# Patient Record
Sex: Female | Born: 1935
Health system: Southern US, Community
[De-identification: ages and names within clinical notes are randomized; demographics above are authoritative.]

## PROBLEM LIST (undated history)

## (undated) DIAGNOSIS — R42 Dizziness and giddiness: Secondary | ICD-10-CM

## (undated) DIAGNOSIS — E039 Hypothyroidism, unspecified: Secondary | ICD-10-CM

## (undated) DIAGNOSIS — E785 Hyperlipidemia, unspecified: Secondary | ICD-10-CM

## (undated) DIAGNOSIS — M1990 Unspecified osteoarthritis, unspecified site: Secondary | ICD-10-CM

## (undated) DIAGNOSIS — Z950 Presence of cardiac pacemaker: Secondary | ICD-10-CM

## (undated) HISTORY — DX: Hyperlipidemia, unspecified: E78.5

## (undated) HISTORY — DX: Unspecified osteoarthritis, unspecified site: M19.90

## (undated) HISTORY — PX: FOOT SURGERY: SHX648

## (undated) HISTORY — DX: Hypothyroidism, unspecified: E03.9

## (undated) HISTORY — PX: BUNIONECTOMY: SHX129

## (undated) HISTORY — PX: BACK SURGERY: SHX140

---

## 2017-02-16 DIAGNOSIS — J343 Hypertrophy of nasal turbinates: Secondary | ICD-10-CM | POA: Diagnosis not present

## 2017-02-16 DIAGNOSIS — J312 Chronic pharyngitis: Secondary | ICD-10-CM | POA: Diagnosis not present

## 2017-02-16 DIAGNOSIS — R0982 Postnasal drip: Secondary | ICD-10-CM | POA: Diagnosis not present

## 2017-02-16 DIAGNOSIS — K136 Irritative hyperplasia of oral mucosa: Secondary | ICD-10-CM | POA: Diagnosis not present

## 2017-02-16 DIAGNOSIS — R070 Pain in throat: Secondary | ICD-10-CM | POA: Diagnosis not present

## 2017-05-31 DIAGNOSIS — E039 Hypothyroidism, unspecified: Secondary | ICD-10-CM | POA: Diagnosis not present

## 2017-05-31 DIAGNOSIS — R5383 Other fatigue: Secondary | ICD-10-CM | POA: Diagnosis not present

## 2017-05-31 DIAGNOSIS — G5793 Unspecified mononeuropathy of bilateral lower limbs: Secondary | ICD-10-CM | POA: Diagnosis not present

## 2017-06-13 DIAGNOSIS — I83891 Varicose veins of right lower extremities with other complications: Secondary | ICD-10-CM | POA: Diagnosis not present

## 2017-06-13 DIAGNOSIS — I83811 Varicose veins of right lower extremities with pain: Secondary | ICD-10-CM | POA: Diagnosis not present

## 2017-06-13 DIAGNOSIS — M79661 Pain in right lower leg: Secondary | ICD-10-CM | POA: Diagnosis not present

## 2017-06-13 DIAGNOSIS — M79662 Pain in left lower leg: Secondary | ICD-10-CM | POA: Diagnosis not present

## 2017-06-22 DIAGNOSIS — H40013 Open angle with borderline findings, low risk, bilateral: Secondary | ICD-10-CM | POA: Diagnosis not present

## 2017-06-22 DIAGNOSIS — H43393 Other vitreous opacities, bilateral: Secondary | ICD-10-CM | POA: Diagnosis not present

## 2017-06-22 DIAGNOSIS — H35363 Drusen (degenerative) of macula, bilateral: Secondary | ICD-10-CM | POA: Diagnosis not present

## 2017-09-28 DIAGNOSIS — J180 Bronchopneumonia, unspecified organism: Secondary | ICD-10-CM | POA: Diagnosis not present

## 2017-11-01 DIAGNOSIS — H9201 Otalgia, right ear: Secondary | ICD-10-CM | POA: Diagnosis not present

## 2018-02-23 ENCOUNTER — Telehealth: Payer: Self-pay | Admitting: Family Medicine

## 2018-02-23 NOTE — Telephone Encounter (Signed)
I am willing to take her as my patient.

## 2018-02-23 NOTE — Telephone Encounter (Signed)
Please advise 

## 2018-02-23 NOTE — Telephone Encounter (Unsigned)
Copied from [place] [id number]. Topic: Appointment Scheduling - New Patient >> Feb 23, 2018  1:12 PM Hewitt Shorts wrote: Pt is wanting to become a new patient to dr blyth-dr blyth also has seen her daughter in the past  nancy cohen   Best number [phone]

## 2018-02-24 NOTE — Telephone Encounter (Signed)
Please schedule next available  new patient appointment  thx

## 2018-02-27 NOTE — Telephone Encounter (Signed)
Left message for pt to call back and schedule np appt with [facility].

## 2018-03-30 ENCOUNTER — Encounter: Payer: Self-pay | Admitting: Family Medicine

## 2018-03-30 ENCOUNTER — Ambulatory Visit (INDEPENDENT_AMBULATORY_CARE_PROVIDER_SITE_OTHER): Payer: Medicare Other | Admitting: Family Medicine

## 2018-03-30 DIAGNOSIS — E039 Hypothyroidism, unspecified: Secondary | ICD-10-CM | POA: Diagnosis not present

## 2018-03-30 DIAGNOSIS — E785 Hyperlipidemia, unspecified: Secondary | ICD-10-CM

## 2018-03-30 DIAGNOSIS — Z0000 Encounter for general adult medical examination without abnormal findings: Secondary | ICD-10-CM | POA: Diagnosis not present

## 2018-03-30 NOTE — Progress Notes (Signed)
Subjective:  I acted as a Education administrator for Dr. Charlett Blake. [place], [place]  Patient ID: Cynthia Young, female    DOB: Oct 10, 1930, 84 y.o.   MRN: [id number]  Chief Complaint  Patient presents with  . New Patient (Initial Visit)    Bilateral leg pain  . Trigger Thumb    left thumb    HPI  Patient is in today to establish care. She has a past medical history of hyperlipidemia and hypothyroidism. She is in good health and she denies any recent febrile illness or hospitalizations. She does well managing her activities of daily living. She maintains a heart healthy diet and stays active. Denies CP/palp/SOB/HA/congestion/fevers/GI or GU c/o. Taking meds as prescribed  Patient Care Team: Mosie Lukes, MD as PCP - General (Family Medicine)   Past Medical History:  Diagnosis Date  . Arthritis   . Hyperlipidemia   . Hypothyroid     Past Surgical History:  Procedure Laterality Date  . BUNIONECTOMY Left   . FOOT SURGERY Right    bunion    Family History  Problem Relation Age of Onset  . Cancer Mother   . Heart disease Father   . Arthritis Father        rheumatoid  . Cancer Brother        colon    Social History   Socioeconomic History  . Marital status: Widowed    Spouse name: Not on file  . Number of children: Not on file  . Years of education: Not on file  . Highest education level: Not on file  Occupational History  . Not on file  Social Needs  . Financial resource strain: Not on file  . Food insecurity:    Worry: Not on file    Inability: Not on file  . Transportation needs:    Medical: Not on file    Non-medical: Not on file  Tobacco Use  . Smoking status: Never Smoker  . Smokeless tobacco: Never Used  Substance and Sexual Activity  . Alcohol use: Yes    Comment: socially  . Drug use: Not on file  . Sexual activity: Not Currently  Lifestyle  . Physical activity:    Days per week: Not on file    Minutes per session: Not on file  . Stress: Not on file    Relationships  . Social connections:    Talks on phone: Not on file    Gets together: Not on file    Attends religious service: Not on file    Active member of club or organization: Not on file    Attends meetings of clubs or organizations: Not on file    Relationship status: Not on file  . Intimate partner violence:    Fear of current or ex partner: Not on file    Emotionally abused: Not on file    Physically abused: Not on file    Forced sexual activity: Not on file  Other Topics Concern  . Not on file  Social History Narrative   worked in [employer], short time. No cigarettes or drug use. Live by self no dietary restictions and walks daily    Outpatient Medications Prior to Visit  Medication Sig Dispense Refill  . Biotin 2500 MCG CAPS Take 5,000 mcg by mouth.    . levothyroxine (SYNTHROID, LEVOTHROID) 50 MCG tablet Take 50 mcg by mouth daily before breakfast.     No facility-administered medications prior to visit.     Not on  File  Review of Systems  Constitutional: Negative for chills, fever and malaise/fatigue.  HENT: Negative for congestion and hearing loss.   Eyes: Negative for discharge.  Respiratory: Negative for cough, sputum production and shortness of breath.   Cardiovascular: Negative for chest pain, palpitations and leg swelling.  Gastrointestinal: Negative for abdominal pain, blood in stool, constipation, diarrhea, heartburn, nausea and vomiting.  Genitourinary: Negative for dysuria, frequency, hematuria and urgency.  Musculoskeletal: Negative for back pain, falls and myalgias.  Skin: Negative for rash.  Neurological: Negative for dizziness, sensory change, loss of consciousness, weakness and headaches.  Endo/Heme/Allergies: Negative for environmental allergies. Does not bruise/bleed easily.  Psychiatric/Behavioral: Negative for depression and suicidal ideas. The patient is not nervous/anxious and does not have insomnia.        Objective:    Physical  Exam  Constitutional: She is oriented to person, place, and time. No distress.  HENT:  Head: Normocephalic and atraumatic.  Right Ear: External ear normal.  Left Ear: External ear normal.  Nose: Nose normal.  Mouth/Throat: Oropharynx is clear and moist. No oropharyngeal exudate.  Eyes: Pupils are equal, round, and reactive to light. Conjunctivae are normal. Right eye exhibits no discharge. Left eye exhibits no discharge. No scleral icterus.  Neck: Normal range of motion. Neck supple. No thyromegaly present.  Cardiovascular: Normal rate, regular rhythm, normal heart sounds and intact distal pulses.  No murmur heard. Pulmonary/Chest: Effort normal and breath sounds normal. No respiratory distress. She has no wheezes. She has no rales.  Abdominal: Soft. Bowel sounds are normal. She exhibits no distension and no mass. There is no tenderness.  Musculoskeletal: Normal range of motion. She exhibits no edema or tenderness.  Lymphadenopathy:    She has no cervical adenopathy.  Neurological: She is alert and oriented to person, place, and time. She has normal reflexes. She displays normal reflexes. No cranial nerve deficit. Coordination normal.  Skin: Skin is warm and dry. No rash noted. She is not diaphoretic.    BP 118/60 (BP Location: Left Arm, Patient Position: Sitting, Cuff Size: Normal)   Pulse 70   Temp 98 F (36.7 C) (Oral)   Resp 16   Ht 4' 11"  (1.499 m)   Wt 127 lb 12.8 oz (58 kg)   SpO2 97%   BMI 25.81 kg/m  Wt Readings from Last 3 Encounters:  03/30/18 127 lb 12.8 oz (58 kg)   BP Readings from Last 3 Encounters:  03/30/18 118/60      There is no immunization history on file for this patient.  Health Maintenance  Topic Date Due  . TETANUS/TDAP  01/21/1949  . DEXA SCAN  01/20/1997  . PNA vac Low Risk Adult (1 of 2 - PCV13) 01/20/1997  . INFLUENZA VACCINE  06/15/2018    Lab Results  Component Value Date   WBC 8.9 03/30/2018   HGB 14.2 03/30/2018   HCT 43.3  03/30/2018   PLT 271.0 03/30/2018   CHOL 215 (H) 03/30/2018   TRIG 185.0 (H) 03/30/2018   HDL 60.20 03/30/2018   LDLCALC 118 (H) 03/30/2018   TSH 2.05 03/30/2018    Lab Results  Component Value Date   TSH 2.05 03/30/2018   Lab Results  Component Value Date   WBC 8.9 03/30/2018   HGB 14.2 03/30/2018   HCT 43.3 03/30/2018   MCV 92.2 03/30/2018   PLT 271.0 03/30/2018   No results found for: NA, K, CHLORIDE, CO2, GLUCOSE, BUN, CREATININE, BILITOT, ALKPHOS, AST, ALT, PROT, ALBUMIN, CALCIUM, ANIONGAP, EGFR,  GFR Lab Results  Component Value Date   CHOL 215 (H) 03/30/2018   Lab Results  Component Value Date   HDL 60.20 03/30/2018   Lab Results  Component Value Date   LDLCALC 118 (H) 03/30/2018   Lab Results  Component Value Date   TRIG 185.0 (H) 03/30/2018   Lab Results  Component Value Date   CHOLHDL 4 03/30/2018   No results found for: HGBA1C       Assessment & Plan:   Problem List Items Addressed This Visit    Hypothyroid   Relevant Medications   levothyroxine (SYNTHROID, LEVOTHROID) 50 MCG tablet   Other Relevant Orders   TSH (Completed)   Hyperlipidemia   Relevant Orders   Lipid panel (Completed)   Preventative health care    Patient encouraged to maintain heart healthy diet, regular exercise, adequate sleep. Consider daily probiotics. Take medications as prescribed. Patient seen with and examined with student.  Agree with documentation See separate note for further documentation      Relevant Orders   CBC with Differential/Platelet (Completed)   TSH (Completed)   Lipid panel (Completed)      I am having Cynthia Young maintain her levothyroxine and Biotin.  No orders of the defined types were placed in this encounter.   CMA served as Education administrator during this visit. History, Physical and Plan performed by medical provider. Documentation and orders reviewed and attested to.  Penni Homans, MD

## 2018-03-30 NOTE — Assessment & Plan Note (Signed)
Patient encouraged to maintain heart healthy diet, regular exercise, adequate sleep. Consider daily probiotics. Take medications as prescribed. Patient seen with and examined with student.  Agree with documentation See separate note for further documentation  

## 2018-03-30 NOTE — Patient Instructions (Addendum)
Hyland's leg cramp med Hydrate roughly 64 oz of clear fluids daily If palpitatins worsen let us know and we can refer to cardiology  Preventive Care 82 Years and Older, Female Preventive care refers to lifestyle choices and visits with your health care provider that can promote health and wellness. What does preventive care include?  A yearly physical exam. This is also called an annual well check.  Dental exams once or twice a year.  Routine eye exams. Ask your health care provider how often you should have your eyes checked.  Personal lifestyle choices, including: ? Daily care of your teeth and gums. ? Regular physical activity. ? Eating a healthy diet. ? Avoiding tobacco and drug use. ? Limiting alcohol use. ? Practicing safe sex. ? Taking low-dose aspirin every day. ? Taking vitamin and mineral supplements as recommended by your health care provider. What happens during an annual well check? The services and screenings done by your health care provider during your annual well check will depend on your age, overall health, lifestyle risk factors, and family history of disease. Counseling Your health care provider may ask you questions about your:  Alcohol use.  Tobacco use.  Drug use.  Emotional well-being.  Home and relationship well-being.  Sexual activity.  Eating habits.  History of falls.  Memory and ability to understand (cognition).  Work and work Statistician.  Reproductive health.  Screening You may have the following tests or measurements:  Height, weight, and BMI.  Blood pressure.  Lipid and cholesterol levels. These may be checked every 5 years, or more frequently if you are over 34 years old.  Skin check.  Lung cancer screening. You may have this screening every year starting at age 82 if you have a 30-pack-year history of smoking and currently smoke or have quit within the past 15 years.  Fecal occult blood test (FOBT) of the stool. You  may have this test every year starting at age 82.  Flexible sigmoidoscopy or colonoscopy. You may have a sigmoidoscopy every 5 years or a colonoscopy every 10 years starting at age 82.  Hepatitis C blood test.  Hepatitis B blood test.  Sexually transmitted disease (STD) testing.  Diabetes screening. This is done by checking your blood sugar (glucose) after you have not eaten for a while (fasting). You may have this done every 1-3 years.  Bone density scan. This is done to screen for osteoporosis. You may have this done starting at age 82.  Mammogram. This may be done every 1-2 years. Talk to your health care provider about how often you should have regular mammograms.  Talk with your health care provider about your test results, treatment options, and if necessary, the need for more tests. Vaccines Your health care provider may recommend certain vaccines, such as:  Influenza vaccine. This is recommended every year.  Tetanus, diphtheria, and acellular pertussis (Tdap, Td) vaccine. You may need a Td booster every 10 years.  Varicella vaccine. You may need this if you have not been vaccinated.  Zoster vaccine. You may need this after age 82.  Measles, mumps, and rubella (MMR) vaccine. You may need at least one dose of MMR if you were born in 1957 or later. You may also need a second dose.  Pneumococcal 13-valent conjugate (PCV13) vaccine. One dose is recommended after age 82.  Pneumococcal polysaccharide (PPSV23) vaccine. One dose is recommended after age 82.  Meningococcal vaccine. You may need this if you have certain conditions.  Hepatitis A  vaccine. You may need this if you have certain conditions or if you travel or work in places where you may be exposed to hepatitis A.  Hepatitis B vaccine. You may need this if you have certain conditions or if you travel or work in places where you may be exposed to hepatitis B.  Haemophilus influenzae type b (Hib) vaccine. You may need  this if you have certain conditions.  Talk to your health care provider about which screenings and vaccines you need and how often you need them. This information is not intended to replace advice given to you by your health care provider. Make sure you discuss any questions you have with your health care provider. Document Released: 11/28/2015 Document Revised: 07/21/2016 Document Reviewed: 09/02/2015 Elsevier Interactive Patient Education  [employer].

## 2018-03-31 LAB — LIPID PANEL
CHOL/HDL RATIO: 4
CHOLESTEROL: 215 mg/dL — AB (ref 0–200)
HDL: 60.2 mg/dL (ref >39.00)
LDL CALC: 118 mg/dL — AB (ref 0–99)
NonHDL: 154.86
Triglycerides: 185 mg/dL — ABNORMAL HIGH (ref 0.0–149.0)
VLDL: 37 mg/dL (ref 0.0–40.0)

## 2018-03-31 LAB — CBC WITH DIFFERENTIAL/PLATELET
BASOS ABS: 0.1 10*3/uL (ref 0.0–0.1)
BASOS PCT: 1.1 % (ref 0.0–3.0)
EOS ABS: 0.2 10*3/uL (ref 0.0–0.7)
Eosinophils Relative: 2 % (ref 0.0–5.0)
HEMATOCRIT: 43.3 % (ref 36.0–46.0)
Hemoglobin: 14.2 g/dL (ref 12.0–15.0)
LYMPHS PCT: 33.4 % (ref 12.0–46.0)
Lymphs Abs: 3 10*3/uL (ref 0.7–4.0)
MCHC: 32.7 g/dL (ref 30.0–36.0)
MCV: 92.2 fl (ref 78.0–100.0)
Monocytes Absolute: 0.9 10*3/uL (ref 0.1–1.0)
Monocytes Relative: 9.6 % (ref 3.0–12.0)
NEUTROS PCT: 53.9 % (ref 43.0–77.0)
Neutro Abs: 4.8 10*3/uL (ref 1.4–7.7)
Platelets: 271 10*3/uL (ref 150.0–400.0)
RBC: 4.7 Mil/uL (ref 3.87–5.11)
RDW: 14.1 % (ref 11.5–15.5)
WBC: 8.9 10*3/uL (ref 4.0–10.5)

## 2018-03-31 LAB — TSH: TSH: 2.05 u[IU]/mL (ref 0.35–4.50)

## 2018-04-18 ENCOUNTER — Telehealth: Payer: Self-pay | Admitting: *Deleted

## 2018-04-18 NOTE — Telephone Encounter (Signed)
Received Medical records from [place]; forwarded to provider/SLS 06/04

## 2018-05-12 ENCOUNTER — Telehealth: Payer: Self-pay | Admitting: Family Medicine

## 2018-05-12 NOTE — Telephone Encounter (Signed)
Copied from [place] [id number]. Topic: Quick Communication - Rx Refill/Question >> May 12, 2018  9:50 AM Marval Regal L wrote: Medication: levothyroxine (SYNTHROID, LEVOTHROID) 50 MCG tablet [[id number]].   Has the patient contacted their pharmacy?yes  Preferred Pharmacy (with phone number or street name): [id number] [place], [place] [phone] (Phone) [phone] (Fax)   Agent: Please be advised that RX refills may take up to 3 business days. We ask that you follow-up with your pharmacy.

## 2018-05-13 NOTE — Telephone Encounter (Signed)
Levothyroxine refill Last OV:03/30/18 Last refill:03/30/18 by historical provider ERX:[id number] Pharmacy: [id number] - [place], [address] AT [address] [phone] (Phone) [phone] (Fax)

## 2018-05-15 MED ORDER — LEVOTHYROXINE SODIUM 50 MCG PO TABS: 50.0000 ug | ORAL_TABLET | Freq: Every day | ORAL | 0 refills | Status: DC

## 2018-05-15 NOTE — Telephone Encounter (Signed)
Medication sent to patient's pharmacy.

## 2018-05-25 ENCOUNTER — Telehealth: Payer: Self-pay

## 2018-05-25 DIAGNOSIS — Z780 Asymptomatic menopausal state: Secondary | ICD-10-CM

## 2018-05-25 DIAGNOSIS — E2839 Other primary ovarian failure: Secondary | ICD-10-CM

## 2018-05-25 NOTE — Telephone Encounter (Signed)
Placed order for Dexa scan

## 2018-06-01 NOTE — Telephone Encounter (Signed)
Received call from [facility] that pt was calling to let us know she has not been contacted to schedule DEXA scan yet. Order was entered for [place] and number was given to pt to call to schedule appt.

## 2018-06-05 ENCOUNTER — Ambulatory Visit (HOSPITAL_BASED_OUTPATIENT_CLINIC_OR_DEPARTMENT_OTHER)
Admission: RE | Admit: 2018-06-05 | Discharge: 2018-06-05 | Disposition: A | Discharge status: Home / Self Care | Payer: Medicare Other | Source: Ambulatory Visit | Attending: Family Medicine | Admitting: Family Medicine

## 2018-06-05 DIAGNOSIS — Z780 Asymptomatic menopausal state: Secondary | ICD-10-CM | POA: Insufficient documentation

## 2018-06-05 DIAGNOSIS — E2839 Other primary ovarian failure: Secondary | ICD-10-CM | POA: Insufficient documentation

## 2018-06-05 DIAGNOSIS — M8588 Other specified disorders of bone density and structure, other site: Secondary | ICD-10-CM | POA: Insufficient documentation

## 2018-06-05 DIAGNOSIS — M810 Age-related osteoporosis without current pathological fracture: Secondary | ICD-10-CM | POA: Diagnosis not present

## 2018-08-09 ENCOUNTER — Other Ambulatory Visit: Payer: Self-pay | Admitting: Family Medicine

## 2018-08-13 DIAGNOSIS — R51 Headache: Secondary | ICD-10-CM | POA: Diagnosis not present

## 2018-08-17 ENCOUNTER — Other Ambulatory Visit: Payer: Self-pay

## 2018-08-17 ENCOUNTER — Emergency Department (HOSPITAL_BASED_OUTPATIENT_CLINIC_OR_DEPARTMENT_OTHER)
Admission: EM | Admit: 2018-08-17 | Discharge: 2018-08-17 | Disposition: A | Discharge status: Home / Self Care | Payer: Medicare Other | Attending: Emergency Medicine | Admitting: Emergency Medicine

## 2018-08-17 ENCOUNTER — Emergency Department (HOSPITAL_BASED_OUTPATIENT_CLINIC_OR_DEPARTMENT_OTHER): Payer: Medicare Other

## 2018-08-17 ENCOUNTER — Encounter (HOSPITAL_BASED_OUTPATIENT_CLINIC_OR_DEPARTMENT_OTHER): Payer: Self-pay | Admitting: *Deleted

## 2018-08-17 DIAGNOSIS — W0110XA Fall on same level from slipping, tripping and stumbling with subsequent striking against unspecified object, initial encounter: Secondary | ICD-10-CM | POA: Diagnosis not present

## 2018-08-17 DIAGNOSIS — E039 Hypothyroidism, unspecified: Secondary | ICD-10-CM | POA: Diagnosis not present

## 2018-08-17 DIAGNOSIS — Y9301 Activity, walking, marching and hiking: Secondary | ICD-10-CM | POA: Insufficient documentation

## 2018-08-17 DIAGNOSIS — R51 Headache: Secondary | ICD-10-CM | POA: Insufficient documentation

## 2018-08-17 DIAGNOSIS — Z79899 Other long term (current) drug therapy: Secondary | ICD-10-CM | POA: Diagnosis not present

## 2018-08-17 DIAGNOSIS — Y92009 Unspecified place in unspecified non-institutional (private) residence as the place of occurrence of the external cause: Secondary | ICD-10-CM | POA: Diagnosis not present

## 2018-08-17 DIAGNOSIS — Y999 Unspecified external cause status: Secondary | ICD-10-CM | POA: Insufficient documentation

## 2018-08-17 DIAGNOSIS — S0990XA Unspecified injury of head, initial encounter: Secondary | ICD-10-CM | POA: Diagnosis not present

## 2018-08-17 MED ORDER — HYDROCODONE-ACETAMINOPHEN 5-325 MG PO TABS: 1.0000 | ORAL_TABLET | Freq: Once | ORAL | Status: DC

## 2018-08-17 NOTE — ED Notes (Signed)
MD in to see patient. Awaiting discharge. Awake, alert, orient times 4. Mild headache. Speech clear.

## 2018-08-17 NOTE — ED Provider Notes (Signed)
[facility] [place] [facility]PART[facility] Provider Note   CSN: [id number] Arrival date & time: 08/17/18  0935     History   Chief Complaint Chief Complaint  Patient presents with  . Fall    HPI Cynthia Young is a 84 y.o. female past month history of hyperlipidemia, hypothyroid who presents for evaluation of persistent headache after head injury that occurred approximately 4 days ago.  She reports that approximately 4 days ago, she was walking in her house when she tripped over a chair, causing her to trip and fall.  She states that she hit the right side of her head on the wall.  She denies any LOC at the time.  She denies any preceding chest pain, dizziness.  Patient states that since then, she has had a headache.  She states that headache is intermittent will slightly improve after taking Tylenol.  She states that when headache began, is gradual in nature and progressively worsened.  Denies any thunderclap headache.  Denies any other new trauma, injury, fall.  She states that she was seen in urgent care 2 days ago for evaluation of symptoms.  At that time, they reported no neuro deficits.  They stated that if she continued to have any, she needed to come to the ED for further evaluation.  Patient reports she is not currently on blood thinners.  Patient denies any vision changes, difficulty speaking, numbness/weakness of her arms or legs, difficulty in bleeding, chest pain, difficulty breathing, neck pain, back pain.  The history is provided by the patient.    Past Medical History:  Diagnosis Date  . Arthritis   . Hyperlipidemia   . Hypothyroid     Patient Active Problem List   Diagnosis Date Noted  . Preventative health care 03/30/2018  . Hypothyroid   . Hyperlipidemia     Past Surgical History:  Procedure Laterality Date  . BUNIONECTOMY Left   . FOOT SURGERY Right    bunion     OB History   None      Home Medications    Prior to Admission medications     Medication Sig Start Date End Date Taking? Authorizing Provider  Biotin 2500 MCG CAPS Take 5,000 mcg by mouth.    [provider]  levothyroxine (SYNTHROID, LEVOTHROID) 50 MCG tablet Take 1 tablet (50 mcg total) by mouth daily before breakfast. 08/09/18   Mosie Lukes, MD    Family History Family History  Problem Relation Age of Onset  . Cancer Mother   . Heart disease Father   . Arthritis Father        rheumatoid  . Cancer Brother        colon    Social History Social History   Tobacco Use  . Smoking status: Never Smoker  . Smokeless tobacco: Never Used  Substance Use Topics  . Alcohol use: Yes    Comment: socially  . Drug use: Not on file     Allergies   Patient has no known allergies.   Review of Systems Review of Systems  Constitutional: Negative for fever.  Eyes: Negative for visual disturbance.  Respiratory: Negative for shortness of breath.   Cardiovascular: Negative for chest pain.  Gastrointestinal: Negative for abdominal pain, nausea and vomiting.  Genitourinary: Negative for dysuria and hematuria.  Musculoskeletal: Negative for back pain and neck pain.  Neurological: Positive for headaches. Negative for weakness and numbness.  All other systems reviewed and are negative.    Physical Exam Updated  Vital Signs BP (!) 170/69 (BP Location: Right Arm)   Pulse (!) 55   Temp 98.4 F (36.9 C) (Oral)   Resp 16   Ht 4\' 11"  (1.499 m)   Wt 57.2 kg   SpO2 100%   BMI 25.45 kg/m   Physical Exam  Constitutional: She is oriented to person, place, and time. She appears well-developed and well-nourished.  HENT:  Head: Normocephalic and atraumatic.    Mouth/Throat: Oropharynx is clear and moist and mucous membranes are normal.  Eyes: Pupils are equal, round, and reactive to light. Conjunctivae, EOM and lids are normal.  Neck: Full passive range of motion without pain.  Full flexion/extension and lateral movement of neck fully intact. No bony  midline tenderness. No deformities or crepitus.   Cardiovascular: Normal rate, regular rhythm, normal heart sounds and normal pulses. Exam reveals no gallop and no friction rub.  No murmur heard. Pulmonary/Chest: Effort normal and breath sounds normal.  Abdominal: Soft. Normal appearance. There is no tenderness. There is no rigidity and no guarding.  Musculoskeletal: Normal range of motion.  Neurological: She is alert and oriented to person, place, and time.  Cranial nerves III-XII intact Follows commands, Moves all extremities  5/5 strength to BUE and BLE  Sensation intact throughout all major nerve distributions Normal finger to nose. No dysdiadochokinesia. No pronator drift. No gait abnormalities  No slurred speech. No facial droop.   Skin: Skin is warm and dry. Capillary refill takes less than 2 seconds.  Psychiatric: She has a normal mood and affect. Her speech is normal.  Nursing note and vitals reviewed.    ED Treatments / Results  Labs (all labs ordered are listed, but only abnormal results are displayed) Labs Reviewed - No data to display  EKG None  Radiology Ct Head Wo Contrast  Result Date: 08/17/2018 CLINICAL DATA:  Tripped in bedroom hitting right side of head on chair. EXAM: CT HEAD WITHOUT CONTRAST TECHNIQUE: Contiguous axial images were obtained from the base of the skull through the vertex without intravenous contrast. COMPARISON:  None FINDINGS: Brain: No evidence of acute infarction, hemorrhage, hydrocephalus, extra-axial collection or mass lesion/mass effect. There is mild diffuse low-attenuation within the subcortical and periventricular white matter compatible with chronic microvascular disease. Prominence of sulci and ventricles identified. Vascular: No hyperdense vessel or unexpected calcification. Skull: Normal. Negative for fracture or focal lesion. Sinuses/Orbits: No acute finding. Other: None. IMPRESSION: 1. No acute intracranial abnormality. 2. Chronic  small vessel ischemic disease and brain atrophy. Electronically Signed   By: Kerby Moors M.D.   On: 08/17/2018 12:12    Procedures Procedures (including critical care time)  Medications Ordered in ED Medications - No data to display   Initial Impression / Assessment and Plan / ED Course  I have reviewed the triage vital signs and the nursing notes.  Pertinent labs & imaging results that were available during my care of the patient were reviewed by me and considered in my medical decision making (see chart for details).     84 year old female who presents for evaluation of headache after she hit her head against a wall 5 days ago.  Was seen in urgent care 2 days ago and was told if she continued to have a headache, she need to come to the ED for further evaluation.  She is not on any blood thinners.  Denies any LOC.  She has not had any vision changes, numbness/weakness of her arms and legs, difficulty ambulate, vomiting.  Reports headache  is gradual in nature.  No thunderclap headache.  On exam, no evidence of neuro deficits.  She does have a small hematoma noted to the right forehead with some tenderness palpation.  No deformity or crepitus noted. Patient is afebrile, non-toxic appearing, sitting comfortably on examination table. Vital signs reviewed and stable.  Low suspicion for ICH/SAH given duration of symptoms, reassuring presentation but given given continued headache and concern, will plan for CT head for evaluation.  CT head negative for any acute abnormality.  Discussed with Dr. Sherry Ruffing after definitive evaluation of patient.  Agrees with plan for discharge home.  Discussed results with patient.  Occurred at home supportive care measures.  Discussed with patient that this could still be some postconcussive symptoms they would not show on imaging.  Encourage at home supportive care measures, including brain rest.  Encouraged to follow-up with her primary care doctor.  Will give  outpatient neurology referral since she continues to have headache. Patient had ample opportunity for questions and discussion. All patient's questions were answered with full understanding. Strict return precautions discussed. Patient expresses understanding and agreement to plan.   Final Clinical Impressions(s) / ED Diagnoses   Final diagnoses:  Minor head injury, initial encounter    ED Discharge Orders    None       Volanda Napoleon, PA-C 08/17/18 1819    Tegeler, Gwenyth Allegra, MD 08/19/18 [phone]

## 2018-08-17 NOTE — ED Notes (Signed)
ED Provider at bedside discussing test results and dispo plan of care. 

## 2018-08-17 NOTE — ED Triage Notes (Signed)
Pt reports trip and fall on Sunday hitting her head against the wall, no loc, seen at urgent care and told if she cont with ha to come here, cont with ha today, 8/10.

## 2018-08-17 NOTE — Discharge Instructions (Signed)
You can take 1000 mg of Tylenol.  Do not exceed 4000 mg of Tylenol a day.  As we discussed, engage in brain rest.  This includes making sure you are getting plenty of rest, staying hydrated.  This also includes limiting the amount of screen time, including TV, computer, phone use.  This also includes things that are strenuous on your brain.  If you do not have any improvement call please follow-up with neurology in the next 2 to 4 weeks for further evaluation.  Return to emergency department for any worsening pain, blurry vision, vomiting, difficulty walking, numbness/weakness of your arms or legs or any other worsening or concerning symptoms.

## 2018-09-21 ENCOUNTER — Ambulatory Visit (INDEPENDENT_AMBULATORY_CARE_PROVIDER_SITE_OTHER): Payer: Medicare Other | Admitting: Family Medicine

## 2018-09-21 ENCOUNTER — Encounter: Payer: Self-pay | Admitting: Family Medicine

## 2018-09-21 DIAGNOSIS — E039 Hypothyroidism, unspecified: Secondary | ICD-10-CM

## 2018-09-21 DIAGNOSIS — E785 Hyperlipidemia, unspecified: Secondary | ICD-10-CM

## 2018-09-21 DIAGNOSIS — M791 Myalgia, unspecified site: Secondary | ICD-10-CM | POA: Diagnosis not present

## 2018-09-21 NOTE — Patient Instructions (Signed)
Jobst stockings are compression hose  Increase hydration to roughly 60-70 ounces of water and noncaffeine beverages  Consider biofreeze, blue emu, Aspercreme makes a new Lidocaine gel to help with the pain Varicose Veins Varicose veins are veins that have become enlarged and twisted. They are usually seen in the legs but can occur in other parts of the body as well. What are the causes? This condition is the result of valves in the veins not working properly. Valves in the veins help to return blood from the leg to the heart. If these valves are damaged, blood flows backward and backs up into the veins in the leg near the skin. This causes the veins to become larger. What increases the risk? People who are on their feet a lot, who are pregnant, or who are overweight are more likely to develop varicose veins. What are the signs or symptoms?  Bulging, twisted-appearing, bluish veins, most commonly found on the legs.  Leg pain or a feeling of heaviness. These symptoms may be worse at the end of the day.  Leg swelling.  Changes in skin color. How is this diagnosed? A health care provider can usually diagnose varicose veins by examining your legs. Your health care provider may also recommend an ultrasound of your leg veins. How is this treated? Most varicose veins can be treated at home.However, other treatments are available for people who have persistent symptoms or want to improve the cosmetic appearance of the varicose veins. These treatment options include:  Sclerotherapy. A solution is injected into the vein to close it off.  Laser treatment. A laser is used to heat the vein to close it off.  Radiofrequency vein ablation. An electrical current produced by radio waves is used to close off the vein.  Phlebectomy. The vein is surgically removed through small incisions made over the varicose vein.  Vein ligation and stripping. The vein is surgically removed through incisions made over  the varicose vein after the vein has been tied (ligated).  Follow these instructions at home:  Do not stand or sit in one position for long periods of time. Do not sit with your legs crossed. Rest with your legs raised during the day.  Wear compression stockings as directed by your health care provider. These stockings help to prevent blood clots and reduce swelling in your legs.  Do not wear other tight, encircling garments around your legs, pelvis, or waist.  Walk as much as possible to increase blood flow.  Raise the foot of your bed at night with 2-inch blocks.  If you get a cut in the skin over the vein and the vein bleeds, lie down with your leg raised and press on it with a clean cloth until the bleeding stops. Then place a bandage (dressing) on the cut. See your health care provider if it continues to bleed. Contact a health care provider if:  The skin around your ankle starts to break down.  You have pain, redness, tenderness, or hard swelling in your leg over a vein.  You are uncomfortable because of leg pain. This information is not intended to replace advice given to you by your health care provider. Make sure you discuss any questions you have with your health care provider. Document Released: 08/11/2005 Document Revised: 04/08/2016 Document Reviewed: 05/04/2016 Elsevier Interactive Patient Education  2017 [employer].

## 2018-09-25 DIAGNOSIS — M791 Myalgia, unspecified site: Secondary | ICD-10-CM | POA: Insufficient documentation

## 2018-09-25 NOTE — Progress Notes (Signed)
Subjective:    Patient ID: Cynthia Young, female    DOB: 11-Aug-1930, 84 y.o.   MRN: [id number]  Chief Complaint  Patient presents with  . Hypothyroidism    Here for 6 months f/up    HPI Patient is in today for follow-up.  She feels well today but she did have a fall back in October she tripped on a chair in her house and hit her head she does feel she had a mild concussion but is improved at this time.  Did also suffer a concussion in 2012.  Her greatest complaint today is actually leg pain.  She notes when she lies down at night her legs hurt the joints as well as the muscles.  She acknowledges not hydrating well.  No recent febrile illness or hospitalization. Denies CP/palp/SOB/HA/congestion/fevers/GI or GU c/o. Taking meds as prescribed  Past Medical History:  Diagnosis Date  . Arthritis   . Hyperlipidemia   . Hypothyroid     Past Surgical History:  Procedure Laterality Date  . BUNIONECTOMY Left   . FOOT SURGERY Right    bunion    Family History  Problem Relation Age of Onset  . Cancer Mother   . Heart disease Father   . Arthritis Father        rheumatoid  . Cancer Brother        colon    Social History   Socioeconomic History  . Marital status: Widowed    Spouse name: Not on file  . Number of children: Not on file  . Years of education: Not on file  . Highest education level: Not on file  Occupational History  . Not on file  Social Needs  . Financial resource strain: Not on file  . Food insecurity:    Worry: Not on file    Inability: Not on file  . Transportation needs:    Medical: Not on file    Non-medical: Not on file  Tobacco Use  . Smoking status: Never Smoker  . Smokeless tobacco: Never Used  Substance and Sexual Activity  . Alcohol use: Yes    Comment: socially  . Drug use: Not on file  . Sexual activity: Not Currently  Lifestyle  . Physical activity:    Days per week: Not on file    Minutes per session: Not on file  . Stress: Not on file   Relationships  . Social connections:    Talks on phone: Not on file    Gets together: Not on file    Attends religious service: Not on file    Active member of club or organization: Not on file    Attends meetings of clubs or organizations: Not on file    Relationship status: Not on file  . Intimate partner violence:    Fear of current or ex partner: Not on file    Emotionally abused: Not on file    Physically abused: Not on file    Forced sexual activity: Not on file  Other Topics Concern  . Not on file  Social History Narrative   worked in [employer], short time. No cigarettes or drug use. Live by self no dietary restictions and walks daily    Outpatient Medications Prior to Visit  Medication Sig Dispense Refill  . Biotin 2500 MCG CAPS Take 5,000 mcg by mouth.    . levothyroxine (SYNTHROID, LEVOTHROID) 50 MCG tablet Take 1 tablet (50 mcg total) by mouth daily before breakfast. 90 tablet 0  No facility-administered medications prior to visit.     No Known Allergies  Review of Systems  Constitutional: Negative for fever and malaise/fatigue.  HENT: Negative for congestion.   Eyes: Negative for blurred vision.  Respiratory: Negative for shortness of breath.   Cardiovascular: Negative for chest pain, palpitations and leg swelling.  Gastrointestinal: Negative for abdominal pain, blood in stool and nausea.  Genitourinary: Negative for dysuria and frequency.  Musculoskeletal: Positive for joint pain and myalgias. Negative for falls.  Skin: Negative for rash.  Neurological: Negative for dizziness, loss of consciousness and headaches.  Endo/Heme/Allergies: Negative for environmental allergies.  Psychiatric/Behavioral: Negative for depression. The patient is not nervous/anxious.        Objective:    Physical Exam  Constitutional: She is oriented to person, place, and time. She appears well-developed and well-nourished. No distress.  HENT:  Head: Normocephalic and  atraumatic.  Nose: Nose normal.  Eyes: Right eye exhibits no discharge. Left eye exhibits no discharge.  Neck: Normal range of motion. Neck supple.  Cardiovascular: Normal rate and regular rhythm.  Pulmonary/Chest: Effort normal and breath sounds normal.  Abdominal: Soft. Bowel sounds are normal. There is no tenderness.  Musculoskeletal: She exhibits no edema.  Neurological: She is alert and oriented to person, place, and time.  Skin: Skin is warm and dry.  Psychiatric: She has a normal mood and affect.  Nursing note and vitals reviewed.   BP (!) 119/55 (BP Location: Right Arm, Patient Position: Sitting, Cuff Size: Small)   Pulse (!) 57   Temp 98.9 F (37.2 C) (Oral)   Resp 16   Ht 4' 11" (1.499 m)   Wt 126 lb (57.2 kg)   SpO2 100%   BMI 25.45 kg/m  Wt Readings from Last 3 Encounters:  09/21/18 126 lb (57.2 kg)  08/17/18 126 lb (57.2 kg)  03/30/18 127 lb 12.8 oz (58 kg)     Lab Results  Component Value Date   WBC 8.9 03/30/2018   HGB 14.2 03/30/2018   HCT 43.3 03/30/2018   PLT 271.0 03/30/2018   CHOL 215 (H) 03/30/2018   TRIG 185.0 (H) 03/30/2018   HDL 60.20 03/30/2018   LDLCALC 118 (H) 03/30/2018   TSH 2.05 03/30/2018    Lab Results  Component Value Date   TSH 2.05 03/30/2018   Lab Results  Component Value Date   WBC 8.9 03/30/2018   HGB 14.2 03/30/2018   HCT 43.3 03/30/2018   MCV 92.2 03/30/2018   PLT 271.0 03/30/2018   No results found for: NA, K, CHLORIDE, CO2, GLUCOSE, BUN, CREATININE, BILITOT, ALKPHOS, AST, ALT, PROT, ALBUMIN, CALCIUM, ANIONGAP, EGFR, GFR Lab Results  Component Value Date   CHOL 215 (H) 03/30/2018   Lab Results  Component Value Date   HDL 60.20 03/30/2018   Lab Results  Component Value Date   LDLCALC 118 (H) 03/30/2018   Lab Results  Component Value Date   TRIG 185.0 (H) 03/30/2018   Lab Results  Component Value Date   CHOLHDL 4 03/30/2018   No results found for: HGBA1C     Assessment & Plan:   Problem List Items  Addressed This Visit    Hypothyroid    On Levothyroxine, continue to monitor      Hyperlipidemia    Encouraged heart healthy diet, increase exercise, avoid trans fats, consider a krill oil cap daily      Myalgia    Notes leg pain at night. Encouraged increased hydration and try topical treatments.  I am having Cynthia Young maintain her Biotin and levothyroxine.  No orders of the defined types were placed in this encounter.    Penni Homans, MD

## 2018-09-25 NOTE — Assessment & Plan Note (Signed)
Notes leg pain at night. Encouraged increased hydration and try topical treatments.

## 2018-09-25 NOTE — Assessment & Plan Note (Signed)
On Levothyroxine, continue to monitor 

## 2018-09-25 NOTE — Assessment & Plan Note (Signed)
Encouraged heart healthy diet, increase exercise, avoid trans fats, consider a krill oil cap daily 

## 2018-10-02 ENCOUNTER — Telehealth: Payer: Self-pay

## 2018-10-02 NOTE — Telephone Encounter (Signed)
Called pt and let her know I refaxed ROI form to Dr. [facility] office. I also scanned confirmation that ROI was sent again 10/02/18.

## 2018-10-02 NOTE — Telephone Encounter (Signed)
Copied from [place] [id number]. Topic: General - Other >> Oct 02, 2018 11:36 AM Marin Olp L wrote: Reason for CRM: Patient would like the release form in her chart from 03/30/2018 to be refaxed to Dr. [facility] office. [phone] The fax back on 03/30/2018 was never received. Patient would like a call to confirm that the fax was resent.    [place] Are you able to print and fax over release form   Please advise

## 2018-10-03 NOTE — Telephone Encounter (Signed)
Thanks

## 2018-11-02 ENCOUNTER — Telehealth: Payer: Self-pay | Admitting: *Deleted

## 2018-11-02 NOTE — Telephone Encounter (Signed)
Received Medical records from Janae Bridgeman, MD/[facility] [place]; forwarded to provider/SLS 12/19

## 2018-11-05 ENCOUNTER — Other Ambulatory Visit: Payer: Self-pay | Admitting: Family Medicine

## 2019-03-16 ENCOUNTER — Ambulatory Visit (INDEPENDENT_AMBULATORY_CARE_PROVIDER_SITE_OTHER): Payer: Medicare Other | Admitting: Family Medicine

## 2019-03-16 ENCOUNTER — Other Ambulatory Visit: Payer: Self-pay

## 2019-03-16 ENCOUNTER — Encounter: Payer: Self-pay | Admitting: Family Medicine

## 2019-03-16 DIAGNOSIS — R079 Chest pain, unspecified: Secondary | ICD-10-CM | POA: Diagnosis not present

## 2019-03-16 DIAGNOSIS — K449 Diaphragmatic hernia without obstruction or gangrene: Secondary | ICD-10-CM | POA: Insufficient documentation

## 2019-03-16 DIAGNOSIS — K219 Gastro-esophageal reflux disease without esophagitis: Secondary | ICD-10-CM | POA: Insufficient documentation

## 2019-03-16 NOTE — Progress Notes (Signed)
Virtual Visit via telephone   I connected with Cynthia Young on 03/16/19 at  3:45 PM EDT by a video enabled telemedicine application and verified that I am speaking with the correct person using two identifiers.  Location: Patient: home  Provider: office    I discussed the limitations of evaluation and management by telemedicine and the availability of in person appointments. The patient expressed understanding and agreed to proceed.  History of Present Illness: Pt is home and had questions about covid 19.  She has had some chest discomfort and cough earlier this week.  She wondered if it could be her hiatal hernia  tums did help.   She is afraid to go to the hospital and wanted an ov.  No chest pain or sob today.  No fevers.     Past Medical History:  Diagnosis Date  . Arthritis   . Hyperlipidemia   . Hypothyroid    Outpatient Encounter Medications as of 03/16/2019  Medication Sig  . Biotin 2500 MCG CAPS Take 5,000 mcg by mouth.  . levothyroxine (SYNTHROID, LEVOTHROID) 50 MCG tablet TAKE 1 TABLET (50 MCG TOTAL) BY MOUTH DAILY BEFORE BREAKFAST.   No facility-administered encounter medications on file as of 03/16/2019.    Observations/Objective: Afebrile per pt,  No other vitals obtained Pt is in NAD  Assessment and Plan: 1. Chest pain, unspecified type Pt discribes cp after eating esp but has had some cough and chills as well  She thinks its her hiatal hernia --- it is not bothering her now.  She can try pepcid but I advised that if the chest pain comes back she should go to ER I told her we can not test for covid 19 in the office and are not seeing people with any resp issues in the office at this time.  She understood and will go to the er if the cp returns.      Follow Up Instructions:    I discussed the assessment and treatment plan with the patient. The patient was provided an opportunity to ask questions and all were answered. The patient agreed with the plan and  demonstrated an understanding of the instructions.   The patient was advised to call back or seek an in-person evaluation if the symptoms worsen or if the condition fails to improve as anticipated.  I provided 25 minutes of non-face-to-face time during this encounter.   Ann Held, DO

## 2019-03-16 NOTE — Assessment & Plan Note (Signed)
Pt discribes cp after eating esp but has had some cough and chills as well  She thinks its her hiatal hernia --- it is not bothering her now.  She can try pepcid but I advised that if the chest pain comes back she should go to ER I told her we can not test for covid 19 in the office and are not seeing people with any resp issues in the office at this time.  She understood and will go to the er if the cp returns.

## 2019-03-22 ENCOUNTER — Ambulatory Visit: Payer: Medicare Other | Admitting: Family Medicine

## 2019-05-25 ENCOUNTER — Other Ambulatory Visit: Payer: Self-pay | Admitting: Family Medicine

## 2019-07-25 ENCOUNTER — Other Ambulatory Visit: Payer: Self-pay

## 2019-08-01 ENCOUNTER — Other Ambulatory Visit: Payer: Self-pay

## 2019-08-02 ENCOUNTER — Ambulatory Visit: Payer: Medicare Other | Admitting: Family Medicine

## 2019-08-02 ENCOUNTER — Encounter: Payer: Self-pay | Admitting: Family Medicine

## 2019-08-02 ENCOUNTER — Ambulatory Visit (INDEPENDENT_AMBULATORY_CARE_PROVIDER_SITE_OTHER): Payer: Medicare Other | Admitting: Family Medicine

## 2019-08-02 VITALS — BP 120/68 | HR 65 | Temp 96.4°F | Resp 18 | Ht 59.0 in | Wt 124.4 lb

## 2019-08-02 DIAGNOSIS — E039 Hypothyroidism, unspecified: Secondary | ICD-10-CM

## 2019-08-02 DIAGNOSIS — K449 Diaphragmatic hernia without obstruction or gangrene: Secondary | ICD-10-CM

## 2019-08-02 DIAGNOSIS — I8393 Asymptomatic varicose veins of bilateral lower extremities: Secondary | ICD-10-CM

## 2019-08-02 DIAGNOSIS — E785 Hyperlipidemia, unspecified: Secondary | ICD-10-CM

## 2019-08-02 LAB — CBC
HCT: 45.2 % (ref 36.0–46.0)
Hemoglobin: 14.7 g/dL (ref 12.0–15.0)
MCHC: 32.5 g/dL (ref 30.0–36.0)
MCV: 92.9 fl (ref 78.0–100.0)
Platelets: 257 10*3/uL (ref 150.0–400.0)
RBC: 4.86 Mil/uL (ref 3.87–5.11)
RDW: 13.8 % (ref 11.5–15.5)
WBC: 8.6 10*3/uL (ref 4.0–10.5)

## 2019-08-02 LAB — COMPREHENSIVE METABOLIC PANEL
ALT: 10 U/L (ref 0–35)
AST: 15 U/L (ref 0–37)
Albumin: 4.3 g/dL (ref 3.5–5.2)
Alkaline Phosphatase: 61 U/L (ref 39–117)
BUN: 18 mg/dL (ref 6–23)
CO2: 30 mEq/L (ref 19–32)
Calcium: 10.1 mg/dL (ref 8.4–10.5)
Chloride: 106 mEq/L (ref 96–112)
Creatinine, Ser: 0.84 mg/dL (ref 0.40–1.20)
GFR: 64.06 mL/min (ref >60.00)
Glucose, Bld: 95 mg/dL (ref 70–99)
Potassium: 5.5 mEq/L — ABNORMAL HIGH (ref 3.5–5.1)
Sodium: 141 mEq/L (ref 135–145)
Total Bilirubin: 0.5 mg/dL (ref 0.2–1.2)
Total Protein: 6.9 g/dL (ref 6.0–8.3)

## 2019-08-02 LAB — LIPID PANEL
Cholesterol: 220 mg/dL — ABNORMAL HIGH (ref 0–200)
HDL: 59.8 mg/dL (ref >39.00)
LDL Cholesterol: 137 mg/dL — ABNORMAL HIGH (ref 0–99)
NonHDL: 160.63
Total CHOL/HDL Ratio: 4
Triglycerides: 117 mg/dL (ref 0.0–149.0)
VLDL: 23.4 mg/dL (ref 0.0–40.0)

## 2019-08-02 LAB — TSH: TSH: 2.08 u[IU]/mL (ref 0.35–4.50)

## 2019-08-02 NOTE — Patient Instructions (Addendum)
Omron blood pressure cuff, upper arm   Pulse oximeter want oxygen in 90s  Varicose Veins Varicose veins are veins that have become enlarged, bulged, and twisted. They most often appear in the legs. What are the causes? This condition is caused by damage to the valves in the vein. These valves help blood return to your heart. When they are damaged and they stop working properly, blood may flow backward and back up in the veins near the skin, causing the veins to get larger and appear twisted. The condition can result from any issue that causes blood to back up, like pregnancy, prolonged standing, or obesity. What increases the risk? This condition is more likely to develop in people who are:  On their feet a lot.  Pregnant.  Overweight. What are the signs or symptoms? Symptoms of this condition include:  Bulging, twisted, and bluish veins.  A feeling of heaviness. This may be worse at the end of the day.  Leg pain. This may be worse at the end of the day.  Swelling in the leg.  Changes in skin color over the veins. How is this diagnosed? This condition may be diagnosed based on your symptoms, a physical exam, and an ultrasound test. How is this treated? Treatment for this condition may involve:  Avoiding sitting or standing in one position for long periods of time.  Wearing compression stockings. These stockings help to prevent blood clots and reduce swelling in the legs.  Raising (elevating) the legs when resting.  Losing weight.  Exercising regularly. If you have persistent symptoms or want to improve the way your varicose veins look, you may choose to have a procedure to close the varicose veins off or to remove them. Treatments to close off the veins include:  Sclerotherapy. In this treatment, a solution is injected into a vein to close it off.  Laser treatment. In this treatment, the vein is heated with a laser to close it off.  Radiofrequency vein ablation. In  this treatment, an electrical current produced by radio waves is used to close off the vein. Treatments to remove the veins include:  Phlebectomy. In this treatment, the veins are removed through small incisions made over the veins.  Vein ligation and stripping. In this treatment, incisions are made over the veins. The veins are then removed after being tied (ligated) with stitches (sutures). Follow these instructions at home: Activity  Walk as much as possible. Walking increases blood flow. This helps blood return to the heart and takes pressure off your veins. It also increases your cardiovascular strength.  Follow your health care provider's instructions about exercising.  Do not stand or sit in one position for a long period of time.  Do not sit with your legs crossed.  Rest with your legs raised during the day. General instructions   Follow any diet instructions given to you by your health care provider.  Wear compression stockings as directed by your health care provider. Do not wear other kinds of tight clothing around your legs, pelvis, or waist.  Elevate your legs at night to above the level of your heart.  If you get a cut in the skin over the varicose vein and the vein bleeds: ? Lie down with your leg raised. ? Apply firm pressure to the cut with a clean cloth until the bleeding stops. ? Place a bandage (dressing) on the cut. Contact a health care provider if:  The skin around your varicose veins starts to break  down.  You have pain, redness, tenderness, or hard swelling over a vein.  You are uncomfortable because of pain.  You get a cut in the skin over a varicose vein and it will not stop bleeding. Summary  Varicose veins are veins that have become enlarged, bulged, and twisted. They most often appear in the legs.  This condition is caused by damage to the valves in the vein. These valves help blood return to your heart.  Treatment for this condition  includes frequent movements, wearing compression stockings, losing weight, and exercising regularly. In some cases, procedures are done to close off or remove the veins.  Treatment for this condition may include wearing compression stockings, elevating the legs, losing weight, and engaging in regular activity. In some cases, procedures are done to close off or remove the veins. This information is not intended to replace advice given to you by your health care provider. Make sure you discuss any questions you have with your health care provider. Document Released: 08/11/2005 Document Revised: 12/28/2018 Document Reviewed: 11/24/2016 Elsevier Patient Education  2020 [employer].

## 2019-08-03 DIAGNOSIS — I8393 Asymptomatic varicose veins of bilateral lower extremities: Secondary | ICD-10-CM | POA: Insufficient documentation

## 2019-08-03 NOTE — Assessment & Plan Note (Signed)
Results in infrequent chest pain. None recently. Avoid offending foods, start probiotics. Do not eat large meals in late evening and consider raising head of bed.

## 2019-08-03 NOTE — Assessment & Plan Note (Signed)
Encouraged heart healthy diet, increase exercise, avoid trans fats, consider a krill oil cap daily 

## 2019-08-03 NOTE — Assessment & Plan Note (Addendum)
Encouraged to wear compression hose daily, on in am off in pm. Referred to vascular surgery for further consideration

## 2019-08-03 NOTE — Assessment & Plan Note (Signed)
On Levothyroxine, continue to monitor 

## 2019-08-03 NOTE — Progress Notes (Signed)
Subjective:    Patient ID: Cynthia Young, female    DOB: 1930-06-14, 85 y.o.   MRN: [id number]  No chief complaint on file.   HPI Patient is in today for follow up on chronic medical concerns including hyperlipidemia, hypothyroidism and hiatal hernia. She notes her hernia will act up at times and result in some chest pain without associated symptoms. No recent episodes. She is concerned about her bilateral varicose veins in her legs. At times they are more swollen and uncomfortable and are becoming more prominent at the front of both lower legs. Denies CP/palp/SOB/HA/congestion/fevers/GI or GU c/o. Taking meds as prescribed  Past Medical History:  Diagnosis Date  . Arthritis   . Hyperlipidemia   . Hypothyroid     Past Surgical History:  Procedure Laterality Date  . BUNIONECTOMY Left   . FOOT SURGERY Right    bunion    Family History  Problem Relation Age of Onset  . Cancer Mother   . Heart disease Father   . Arthritis Father        rheumatoid  . Cancer Brother        colon    Social History   Socioeconomic History  . Marital status: Widowed    Spouse name: Not on file  . Number of children: Not on file  . Years of education: Not on file  . Highest education level: Not on file  Occupational History  . Not on file  Social Needs  . Financial resource strain: Not on file  . Food insecurity    Worry: Not on file    Inability: Not on file  . Transportation needs    Medical: Not on file    Non-medical: Not on file  Tobacco Use  . Smoking status: Never Smoker  . Smokeless tobacco: Never Used  Substance and Sexual Activity  . Alcohol use: Yes    Comment: socially  . Drug use: Not on file  . Sexual activity: Not Currently  Lifestyle  . Physical activity    Days per week: Not on file    Minutes per session: Not on file  . Stress: Not on file  Relationships  . Social Herbalist on phone: Not on file    Gets together: Not on file    Attends religious  service: Not on file    Active member of club or organization: Not on file    Attends meetings of clubs or organizations: Not on file    Relationship status: Not on file  . Intimate partner violence    Fear of current or ex partner: Not on file    Emotionally abused: Not on file    Physically abused: Not on file    Forced sexual activity: Not on file  Other Topics Concern  . Not on file  Social History Narrative   worked in [employer], short time. No cigarettes or drug use. Live by self no dietary restictions and walks daily    Outpatient Medications Prior to Visit  Medication Sig Dispense Refill  . Biotin 2500 MCG CAPS Take 5,000 mcg by mouth.    . levothyroxine (SYNTHROID) 50 MCG tablet TAKE 1 TABLET BY MOUTH EVERY DAY 90 tablet 1   No facility-administered medications prior to visit.     No Known Allergies  Review of Systems  Constitutional: Negative for fever and malaise/fatigue.  HENT: Negative for congestion.   Eyes: Negative for blurred vision.  Respiratory: Negative for shortness of  breath.   Cardiovascular: Negative for chest pain, palpitations and leg swelling.  Gastrointestinal: Negative for abdominal pain, blood in stool and nausea.  Genitourinary: Negative for dysuria and frequency.  Musculoskeletal: Positive for joint pain and myalgias. Negative for falls.  Skin: Negative for rash.  Neurological: Negative for dizziness, loss of consciousness and headaches.  Endo/Heme/Allergies: Negative for environmental allergies.  Psychiatric/Behavioral: Negative for depression. The patient is not nervous/anxious.        Objective:    Physical Exam Vitals signs and nursing note reviewed.  Constitutional:      General: She is not in acute distress.    Appearance: She is well-developed.  HENT:     Head: Normocephalic and atraumatic.     Nose: Nose normal.  Eyes:     General:        Right eye: No discharge.        Left eye: No discharge.  Neck:      Musculoskeletal: Normal range of motion and neck supple.  Cardiovascular:     Rate and Rhythm: Normal rate and regular rhythm.     Heart sounds: No murmur.  Pulmonary:     Effort: Pulmonary effort is normal.     Breath sounds: Normal breath sounds.  Abdominal:     General: Bowel sounds are normal.     Palpations: Abdomen is soft.     Tenderness: There is no abdominal tenderness.  Musculoskeletal:     Comments: Prominent varicose veins noted over bilateral tibial plateaus.   Skin:    General: Skin is warm and dry.  Neurological:     Mental Status: She is alert and oriented to person, place, and time.     BP 120/68 (BP Location: Left Arm, Patient Position: Sitting, Cuff Size: Normal)   Pulse 65   Temp (!) 96.4 F (35.8 C) (Temporal)   Resp 18   Ht 4\' 11"  (1.499 m)   Wt 124 lb 6.4 oz (56.4 kg)   SpO2 98%   BMI 25.13 kg/m  Wt Readings from Last 3 Encounters:  08/02/19 124 lb 6.4 oz (56.4 kg)  09/21/18 126 lb (57.2 kg)  08/17/18 126 lb (57.2 kg)    Diabetic Foot Exam - Simple   No data filed     Lab Results  Component Value Date   WBC 8.6 08/02/2019   HGB 14.7 08/02/2019   HCT 45.2 08/02/2019   PLT 257.0 08/02/2019   GLUCOSE 95 08/02/2019   CHOL 220 (H) 08/02/2019   TRIG 117.0 08/02/2019   HDL 59.80 08/02/2019   LDLCALC 137 (H) 08/02/2019   ALT 10 08/02/2019   AST 15 08/02/2019   NA 141 08/02/2019   K 5.5 No hemolysis seen (H) 08/02/2019   CL 106 08/02/2019   CREATININE 0.84 08/02/2019   BUN 18 08/02/2019   CO2 30 08/02/2019   TSH 2.08 08/02/2019    Lab Results  Component Value Date   TSH 2.08 08/02/2019   Lab Results  Component Value Date   WBC 8.6 08/02/2019   HGB 14.7 08/02/2019   HCT 45.2 08/02/2019   MCV 92.9 08/02/2019   PLT 257.0 08/02/2019   Lab Results  Component Value Date   NA 141 08/02/2019   K 5.5 No hemolysis seen (H) 08/02/2019   CO2 30 08/02/2019   GLUCOSE 95 08/02/2019   BUN 18 08/02/2019   CREATININE 0.84 08/02/2019    BILITOT 0.5 08/02/2019   ALKPHOS 61 08/02/2019   AST 15 08/02/2019  ALT 10 08/02/2019   PROT 6.9 08/02/2019   ALBUMIN 4.3 08/02/2019   CALCIUM 10.1 08/02/2019   GFR 64.06 08/02/2019   Lab Results  Component Value Date   CHOL 220 (H) 08/02/2019   Lab Results  Component Value Date   HDL 59.80 08/02/2019   Lab Results  Component Value Date   LDLCALC 137 (H) 08/02/2019   Lab Results  Component Value Date   TRIG 117.0 08/02/2019   Lab Results  Component Value Date   CHOLHDL 4 08/02/2019   No results found for: HGBA1C     Assessment & Plan:   Problem List Items Addressed This Visit    Hypothyroid    On Levothyroxine, continue to monitor      Relevant Orders   TSH (Completed)   Hyperlipidemia    Encouraged heart healthy diet, increase exercise, avoid trans fats, consider a krill oil cap daily      Relevant Orders   Comprehensive metabolic panel (Completed)   Lipid panel (Completed)   Hiatal hernia    Results in infrequent chest pain. None recently. Avoid offending foods, start probiotics. Do not eat large meals in late evening and consider raising head of bed.       Varicose veins of both lower extremities - Primary    Encouraged to wear compression hose daily, on in am off in pm. Referred to vascular surgery for further consideration      Relevant Orders   Ambulatory referral to Vascular Surgery   CBC (Completed)      I am having Cynthia Young maintain her Biotin and levothyroxine.  No orders of the defined types were placed in this encounter.    Penni Homans, MD

## 2019-08-07 ENCOUNTER — Ambulatory Visit: Payer: Medicare Other | Admitting: Family Medicine

## 2019-08-08 ENCOUNTER — Ambulatory Visit: Payer: Self-pay | Admitting: *Deleted

## 2019-08-08 DIAGNOSIS — E039 Hypothyroidism, unspecified: Secondary | ICD-10-CM

## 2019-08-08 DIAGNOSIS — E785 Hyperlipidemia, unspecified: Secondary | ICD-10-CM

## 2019-08-08 NOTE — Telephone Encounter (Signed)
  Reason for Disposition . [1] Chest pain lasts > 5 minutes AND [2] occurred in past 3 days (72 hours)  Protocols used: CHEST PAIN-A-AH

## 2019-08-08 NOTE — Telephone Encounter (Addendum)
Back and chest pain- patient had appointment 9/17. Patient wants to know if flu shot could cause pain in back and chest. Patient states she had chest and back pain that started Monday night- she has been treating with TUMS and warm teas. Patient is still having discomfort in the middle of her chest and back. Patient states it is not as bad today- but still present. Advised ED per protocol- patient declines. She thinks she may be having exacerbation of hiatal hernia or possible COVID- questions if cause. Notes sent to office.  Reason for Disposition . [1] Chest pain lasts > 5 minutes AND [2] age > 57  Answer Assessment - Initial Assessment Questions 1. LOCATION: "Where does it hurt?"       Center of chest between breast 2. RADIATION: "Does the pain go anywhere else?" (e.g., into neck, jaw, arms, back)     Radiates into the back below the neck 3. ONSET: "When did the chest pain begin?" (Minutes, hours or days)      Yesterday- or the night before 4. PATTERN "Does the pain come and go, or has it been constant since it started?"  "Does it get worse with exertion?"      Constant- feels better when walking 5. DURATION: "How long does it last" (e.g., seconds, minutes, hours)     hours 6. SEVERITY: "How bad is the pain?"  (e.g., Scale 1-10; mild, moderate, or severe)    - MILD (1-3): doesn't interfere with normal activities     - MODERATE (4-7): interferes with normal activities or awakens from sleep    - SEVERE (8-10): excruciating pain, unable to do any normal activities       Mild/moderate 7. CARDIAC RISK FACTORS: "Do you have any history of heart problems or risk factors for heart disease?" (e.g., angina, prior heart attack; diabetes, high blood pressure, high cholesterol, smoker, or strong family history of heart disease)     No 8. PULMONARY RISK FACTORS: "Do you have any history of lung disease?"  (e.g., blood clots in lung, asthma, emphysema, birth control pills)     no 9. CAUSE: "What do  you think is causing the chest pain?"     Patient took TUM and had been drinking teas- lessens the pain a little 10. OTHER SYMPTOMS: "Do you have any other symptoms?" (e.g., dizziness, nausea, vomiting, sweating, fever, difficulty breathing, cough)       patient is a little chilly 11. PREGNANCY: "Is there any chance you are pregnant?" "When was your last menstrual period?"       n/a  Protocols used: CHEST PAIN-A-AH

## 2019-08-08 NOTE — Telephone Encounter (Signed)
Dr. Larose Kells reviewed message and agrees with Patient going to the ed. Patient was advised that was our first option, she also complains of generalized body aches "feels like when you get the flu". I advised patient she can come to the [facility] and get a full evaluation. She will try to come tonight for assessment.

## 2019-08-16 ENCOUNTER — Other Ambulatory Visit: Payer: Self-pay

## 2019-08-16 DIAGNOSIS — I8393 Asymptomatic varicose veins of bilateral lower extremities: Secondary | ICD-10-CM

## 2019-08-20 ENCOUNTER — Telehealth: Payer: Self-pay | Admitting: *Deleted

## 2019-08-20 NOTE — Telephone Encounter (Signed)
Patient received her letter in the mail regarding her lab results- she was a little disappointed because she is used to getting a call from her provider about her labs- reviewed K+ rich foods to avoid in her diet and offered to make the follow up lab appointment. Patient declines appointment at this time- she is going to work on her diet and call back for lab test.

## 2019-08-21 DIAGNOSIS — L820 Inflamed seborrheic keratosis: Secondary | ICD-10-CM | POA: Diagnosis not present

## 2019-08-21 DIAGNOSIS — D1801 Hemangioma of skin and subcutaneous tissue: Secondary | ICD-10-CM | POA: Diagnosis not present

## 2019-08-24 ENCOUNTER — Encounter (HOSPITAL_COMMUNITY): Payer: Medicare Other

## 2019-09-05 ENCOUNTER — Ambulatory Visit (INDEPENDENT_AMBULATORY_CARE_PROVIDER_SITE_OTHER): Payer: Medicare Other | Admitting: Physician Assistant

## 2019-09-05 ENCOUNTER — Other Ambulatory Visit: Payer: Self-pay

## 2019-09-05 ENCOUNTER — Ambulatory Visit (HOSPITAL_COMMUNITY)
Admission: RE | Admit: 2019-09-05 | Discharge: 2019-09-05 | Disposition: A | Discharge status: Home / Self Care | Payer: Medicare Other | Source: Ambulatory Visit | Attending: Vascular Surgery | Admitting: Vascular Surgery

## 2019-09-05 VITALS — BP 151/78 | HR 73 | Temp 97.3°F | Resp 14 | Ht 59.0 in | Wt 122.0 lb

## 2019-09-05 DIAGNOSIS — G609 Hereditary and idiopathic neuropathy, unspecified: Secondary | ICD-10-CM

## 2019-09-05 DIAGNOSIS — I8393 Asymptomatic varicose veins of bilateral lower extremities: Secondary | ICD-10-CM | POA: Diagnosis not present

## 2019-09-05 NOTE — Progress Notes (Signed)
[facility] [place]   Reason for referral: B feet with numbness, feels like she is wearing socks all day.    History of Present Illness  Cynthia Young is a 85 y.o. female who presents with chief complaint: numbness and feels like she is wearing socks B feet and lower legs.   Patient notes, onset of symptoms 7 years ago.  The patient has had no history of DVT, no history of varicose vein, no history of venous stasis ulcers, no history of  Lymphedema and no  history of skin changes in lower legs.  There is no family history of venous disorders.  The patient has no used compression stockings in the past.  Past Medical History:  Diagnosis Date  . Arthritis   . Hyperlipidemia   . Hypothyroid     Past Surgical History:  Procedure Laterality Date  . BUNIONECTOMY Left   . FOOT SURGERY Right    bunion    Social History   Socioeconomic History  . Marital status: Widowed    Spouse name: Not on file  . Number of children: Not on file  . Years of education: Not on file  . Highest education level: Not on file  Occupational History  . Not on file  Social Needs  . Financial resource strain: Not on file  . Food insecurity    Worry: Not on file    Inability: Not on file  . Transportation needs    Medical: Not on file    Non-medical: Not on file  Tobacco Use  . Smoking status: Never Smoker  . Smokeless tobacco: Never Used  Substance and Sexual Activity  . Alcohol use: Yes    Comment: socially  . Drug use: Not on file  . Sexual activity: Not Currently  Lifestyle  . Physical activity    Days per week: Not on file    Minutes per session: Not on file  . Stress: Not on file  Relationships  . Social Herbalist on phone: Not on file    Gets together: Not on file    Attends religious service: Not on file    Active member of club or organization: Not on file    Attends meetings of clubs or organizations: Not on file    Relationship status: Not on file   . Intimate partner violence    Fear of current or ex partner: Not on file    Emotionally abused: Not on file    Physically abused: Not on file    Forced sexual activity: Not on file  Other Topics Concern  . Not on file  Social History Narrative   worked in [employer], short time. No cigarettes or drug use. Live by self no dietary restictions and walks daily    Family History  Problem Relation Age of Onset  . Cancer Mother   . Heart disease Father   . Arthritis Father        rheumatoid  . Cancer Brother        colon    Current Outpatient Medications on File Prior to Visit  Medication Sig Dispense Refill  . Biotin 2500 MCG CAPS Take 5,000 mcg by mouth.    . levothyroxine (SYNTHROID) 50 MCG tablet TAKE 1 TABLET BY MOUTH EVERY DAY 90 tablet 1   No current facility-administered medications on file prior to visit.     Allergies as of 09/05/2019  . (No Known Allergies)  ROS:   General:  No weight loss, Fever, chills  HEENT: No recent headaches, no nasal bleeding, no visual changes, no sore throat  Neurologic: No dizziness, blackouts, seizures. No recent symptoms of stroke or mini- stroke. No recent episodes of slurred speech, or temporary blindness.  Cardiac: No recent episodes of chest pain/pressure, no shortness of breath at rest.  No shortness of breath with exertion.  Denies history of atrial fibrillation or irregular heartbeat  Vascular: No history of rest pain in feet.  No history of claudication.  No history of non-healing ulcer, No history of DVT   Pulmonary: No home oxygen, no productive cough, no hemoptysis,  No asthma or wheezing  Musculoskeletal:  [ ]  Arthritis, [ ]  Low back pain,  [ ]  Joint pain  Hematologic:No history of hypercoagulable state.  No history of easy bleeding.  No history of anemia  Gastrointestinal: No hematochezia or melena,  No gastroesophageal reflux, no trouble swallowing  Urinary: [ ]  chronic Kidney disease, [ ]  on HD - [ ]  MWF  or [ ]  TTHS, [ ]  Burning with urination, [ ]  Frequent urination, [ ]  Difficulty urinating;   Skin: No rashes  Psychological: No history of anxiety,  No history of depression  Physical Examination  Vitals:   09/05/19 1547  BP: (!) 151/78  Pulse: 73  Resp: 14  Temp: (!) 97.3 F (36.3 C)  TempSrc: Temporal  SpO2: 96%  Weight: 122 lb (55.3 kg)  Height: 4\' 11"  (1.499 m)    Body mass index is 24.64 kg/m.  General:  Alert and oriented, no acute distress HEENT: Normal Neck: No bruit or JVD Pulmonary: Clear to auscultation bilaterally Cardiac: Regular Rate and Rhythm without murmur Abdomen: Soft, non-tender, non-distended, no mass, no scars Skin: No rash, multiple areas of telangiectasis B LE Extremity Pulses:  2+ radial, brachial, femoral, dorsalis pedis pulses bilaterally Musculoskeletal: No deformity or edema  Neurologic: Upper and lower extremity motor 5/5 and symmetric  DATA:       Venous Reflux Times Normal value < 0.5 sec +------------------------------+----------+---------+                               Right (ms)Left (ms) +------------------------------+----------+---------+ CFV                           1049.00             +------------------------------+----------+---------+ GSV at Saphenofemoral junction2538.00             +------------------------------+----------+---------+ GSV prox thigh                          2428.00   +------------------------------+----------+---------+ GSV prox calf                           5706.00   +------------------------------+----------+---------+ GSV mid calf                            5956.00   +------------------------------+----------+---------+  +------------------------------+----------+--------------+ VEIN DIAMETERS:               Right (cm)Left (cm)      +------------------------------+----------+--------------+ GSV at Saphenofemoral junction0.651     0.556           +------------------------------+----------+--------------+ GSV at prox thigh  0.324     0.361          +------------------------------+----------+--------------+ GSV at mid thigh              0.342     0.328          +------------------------------+----------+--------------+ GSV at distal thigh           0.353     0.315          +------------------------------+----------+--------------+ GSV at knee                   0.304     0.548          +------------------------------+----------+--------------+ GSV prox calf                 0.166     0.289          +------------------------------+----------+--------------+ GSV mid calf                            0.243          +------------------------------+----------+--------------+ SSV origin                    0.333     0.205          +------------------------------+----------+--------------+ SSV prox                      0.334     0.123          +------------------------------+----------+--------------+ SSV mid                       0.296     Not Visualized +------------------------------+----------+--------------+       Summary: Right: No reflux was noted in the femoral vein in the thigh, and popliteal vein.  Abnormal reflux times were noted in the common femoral vein, and great saphenous vein at the saphenofemoral junction.   There is no evidence of deep vein thrombosis in the lower extremity within the CFV, FV, and POPV. There is no evidence of superficial venous thrombosis within the visualized GSV and SSV.  Assessment: Mild venous reflux  multiple areas of telangiectasis B LE Peripheral neuropathy of unknown cause  Plan: Shoe wear when ambulating.  Frequent skin checks.  F/U with PCP for possible testing or referral the neurologist for neuropathy.  Activity as tolerates, elevation when at rest.  F/U as needed  Roxy Horseman PA-C [facility]  [place] Office: [phone]  MD in clinic [place]

## 2019-10-18 ENCOUNTER — Other Ambulatory Visit: Payer: Self-pay

## 2019-10-18 ENCOUNTER — Emergency Department (HOSPITAL_BASED_OUTPATIENT_CLINIC_OR_DEPARTMENT_OTHER)
Admission: EM | Admit: 2019-10-18 | Discharge: 2019-10-18 | Disposition: A | Discharge status: Home / Self Care | Payer: Medicare Other | Attending: Emergency Medicine | Admitting: Emergency Medicine

## 2019-10-18 ENCOUNTER — Ambulatory Visit: Payer: Self-pay | Admitting: Family Medicine

## 2019-10-18 ENCOUNTER — Emergency Department (HOSPITAL_BASED_OUTPATIENT_CLINIC_OR_DEPARTMENT_OTHER): Payer: Medicare Other

## 2019-10-18 DIAGNOSIS — Z79899 Other long term (current) drug therapy: Secondary | ICD-10-CM | POA: Insufficient documentation

## 2019-10-18 DIAGNOSIS — R0789 Other chest pain: Secondary | ICD-10-CM | POA: Insufficient documentation

## 2019-10-18 DIAGNOSIS — E785 Hyperlipidemia, unspecified: Secondary | ICD-10-CM | POA: Diagnosis not present

## 2019-10-18 DIAGNOSIS — R05 Cough: Secondary | ICD-10-CM | POA: Diagnosis not present

## 2019-10-18 DIAGNOSIS — E039 Hypothyroidism, unspecified: Secondary | ICD-10-CM | POA: Insufficient documentation

## 2019-10-18 LAB — CBC WITH DIFFERENTIAL/PLATELET
Abs Immature Granulocytes: 0.02 10*3/uL (ref 0.00–0.07)
Basophils Absolute: 0.1 10*3/uL (ref 0.0–0.1)
Basophils Relative: 1 %
Eosinophils Absolute: 0.2 10*3/uL (ref 0.0–0.5)
Eosinophils Relative: 2 %
HCT: 45.3 % (ref 36.0–46.0)
Hemoglobin: 14.1 g/dL (ref 12.0–15.0)
Immature Granulocytes: 0 %
Lymphocytes Relative: 26 %
Lymphs Abs: 1.7 10*3/uL (ref 0.7–4.0)
MCH: 29.9 pg (ref 26.0–34.0)
MCHC: 31.1 g/dL (ref 30.0–36.0)
MCV: 96.2 fL (ref 80.0–100.0)
Monocytes Absolute: 0.7 10*3/uL (ref 0.1–1.0)
Monocytes Relative: 10 %
Neutro Abs: 4 10*3/uL (ref 1.7–7.7)
Neutrophils Relative %: 61 %
Platelets: 263 10*3/uL (ref 150–400)
RBC: 4.71 MIL/uL (ref 3.87–5.11)
RDW: 13 % (ref 11.5–15.5)
WBC: 6.6 10*3/uL (ref 4.0–10.5)
nRBC: 0 % (ref 0.0–0.2)

## 2019-10-18 LAB — BASIC METABOLIC PANEL
Anion gap: 6 (ref 5–15)
BUN: 11 mg/dL (ref 8–23)
CO2: 25 mmol/L (ref 22–32)
Calcium: 9.6 mg/dL (ref 8.9–10.3)
Chloride: 107 mmol/L (ref 98–111)
Creatinine, Ser: 0.71 mg/dL (ref 0.44–1.00)
GFR calc Af Amer: >60 mL/min (ref >60)
GFR calc non Af Amer: >60 mL/min (ref >60)
Glucose, Bld: 104 mg/dL — ABNORMAL HIGH (ref 70–99)
Potassium: 4.4 mmol/L (ref 3.5–5.1)
Sodium: 138 mmol/L (ref 135–145)

## 2019-10-18 LAB — TROPONIN I (HIGH SENSITIVITY)
Troponin I (High Sensitivity): 8 ng/L (ref <18)
Troponin I (High Sensitivity): 11 ng/L (ref <18)

## 2019-10-18 LAB — LIPASE, BLOOD: Lipase: 21 U/L (ref 11–51)

## 2019-10-18 NOTE — Discharge Instructions (Addendum)
Your cardiac work-up today was unremarkable.  Dr. Davina Poke with cardiology will call you for follow-up appointment.  Please follow-up with your primary care doctor.  Please return to the ED if your symptoms worsen.

## 2019-10-18 NOTE — Telephone Encounter (Signed)
Pt reports upper abdomen and upper back pain, onset 3 days ago. Also reports headache, "But I get those a lot." Denies any nausea,vomiting, no diarrhea, LBM yesterday. Rates pain at 8/10, constant, 9/10 yesterday. States kept her awake last night, "Couldn't even read or watch TV." Advised ED, pt very anxious at recommendation. Reiterated need for eval in ED,pt initially declined. TN called practice for reinforcement of recommendation. Pt states will follow disposition.Care advise given per protocol.  Reason for Disposition . [1] SEVERE pain (e.g., excruciating) AND [2] present > 1 hour  Answer Assessment - Initial Assessment Questions 1. LOCATION: "Where does it hurt?"      Upper abdomen under both breasts 2. RADIATION: "Does the pain shoot anywhere else?" (e.g., chest, back)     Upper back 3. ONSET: "When did the pain begin?" (e.g., minutes, hours or days ago)      3 days ago 4. SUDDEN: "Gradual or sudden onset?"     gradual 5. PATTERN "Does the pain come and go, or is it constant?"    - If constant: "Is it getting better, staying the same, or worsening?"      (Note: Constant means the pain never goes away completely; most serious pain is constant and it progresses)     - If intermittent: "How long does it last?" "Do you have pain now?"     (Note: Intermittent means the pain goes away completely between bouts)     Constant 6. SEVERITY: "How bad is the pain?"  (e.g., Scale 1-10; mild, moderate, or severe)   - MILD (1-3): doesn't interfere with normal activities, abdomen soft and not tender to touch    - MODERATE (4-7): interferes with normal activities or awakens from sleep, tender to touch    - SEVERE (8-10): excruciating pain, doubled over, unable to do any normal activities      8/10 this AM, 9/10 yesterday 7. RECURRENT SYMPTOM: "Have you ever had this type of abdominal pain before?" If so, ask: "When was the last time?" and "What happened that time?"      Yes, with Hiatal Hernia. 8.  CAUSE: "What do you think is causing the abdominal pain?"     Not sure 9. RELIEVING/AGGRAVATING FACTORS: "What makes it better or worse?" (e.g., movement, antacids, bowel movement)     no 10. OTHER SYMPTOMS: "Has there been any vomiting, diarrhea, constipation, or urine problems?"       Headache  Protocols used: ABDOMINAL PAIN - [facility]

## 2019-10-18 NOTE — ED Provider Notes (Signed)
[facility] DEPARTMENT Provider Note   CSN: [id number] Arrival date & time: 10/18/19  [id number]     History   Chief Complaint Chief Complaint  Patient presents with  . Chest Pain    HPI Cynthia Young is a 85 y.o. female.     The history is provided by the patient.  Chest Pain Pain location:  L chest and R chest Pain quality: aching   Pain radiates to:  Upper back Pain severity:  Mild Onset quality:  Gradual Duration:  3 days Timing:  Constant Progression:  Resolved Chronicity:  New Context: movement   Relieved by:  Nothing Ineffective treatments:  Nitroglycerin Associated symptoms: back pain   Associated symptoms: no abdominal pain, no altered mental status, no anxiety, no claudication, no cough, no diaphoresis, no dizziness, no dysphagia, no fever, no lower extremity edema, no numbness, no orthopnea, no palpitations, no PND, no shortness of breath and no vomiting   Risk factors: high cholesterol   Risk factors: no coronary artery disease, no diabetes mellitus, no prior DVT/PE and no smoking     Past Medical History:  Diagnosis Date  . Arthritis   . Hyperlipidemia   . Hypothyroid     Patient Active Problem List   Diagnosis Date Noted  . Varicose veins of both lower extremities 08/03/2019  . Hiatal hernia 03/16/2019  . Myalgia 09/25/2018  . Preventative health care 03/30/2018  . Hypothyroid   . Hyperlipidemia     Past Surgical History:  Procedure Laterality Date  . BUNIONECTOMY Left   . FOOT SURGERY Right    bunion     OB History   No obstetric history on file.      Home Medications    Prior to Admission medications   Medication Sig Start Date End Date Taking? Authorizing Provider  Biotin 2500 MCG CAPS Take 5,000 mcg by mouth.    [provider]  levothyroxine (SYNTHROID) 50 MCG tablet TAKE 1 TABLET BY MOUTH EVERY DAY 05/28/19   Mosie Lukes, MD    Family History Family History  Problem Relation Age of Onset  .  Cancer Mother   . Heart disease Father   . Arthritis Father        rheumatoid  . Cancer Brother        colon    Social History Social History   Tobacco Use  . Smoking status: Never Smoker  . Smokeless tobacco: Never Used  Substance Use Topics  . Alcohol use: Yes    Comment: socially  . Drug use: Not on file     Allergies   Patient has no known allergies.   Review of Systems Review of Systems  Constitutional: Negative for chills, diaphoresis and fever.  HENT: Negative for ear pain, sore throat and trouble swallowing.   Eyes: Negative for pain and visual disturbance.  Respiratory: Negative for cough and shortness of breath.   Cardiovascular: Positive for chest pain. Negative for palpitations, orthopnea, claudication and PND.  Gastrointestinal: Negative for abdominal pain and vomiting.  Genitourinary: Negative for dysuria and hematuria.  Musculoskeletal: Positive for back pain. Negative for arthralgias.  Skin: Negative for color change and rash.  Neurological: Negative for dizziness, seizures, syncope and numbness.  All other systems reviewed and are negative.    Physical Exam Updated Vital Signs  ED Triage Vitals  Enc Vitals Group     BP 10/18/19 0956 (!) 157/68     Pulse Rate 10/18/19 0956 (!) 54  Resp 10/18/19 0956 (!) 22     Temp 10/18/19 0956 99.7 F (37.6 C)     Temp Source 10/18/19 0956 Oral     SpO2 10/18/19 0956 100 %     Weight 10/18/19 0955 125 lb (56.7 kg)     Height 10/18/19 0955 5\' 3"  (1.6 m)     Head Circumference --      Peak Flow --      Pain Score --      Pain Loc --      Pain Edu? --      Excl. in [place]? --     Physical Exam Vitals signs and nursing note reviewed.  Constitutional:      General: She is not in acute distress.    Appearance: She is well-developed. She is not ill-appearing.  HENT:     Head: Normocephalic and atraumatic.  Eyes:     Conjunctiva/sclera: Conjunctivae normal.  Neck:     Musculoskeletal: Neck supple.   Cardiovascular:     Rate and Rhythm: Normal rate and regular rhythm.     Pulses:          Radial pulses are 2+ on the right side and 2+ on the left side.       Dorsalis pedis pulses are 2+ on the right side and 2+ on the left side.     Heart sounds: Normal heart sounds. No murmur.  Pulmonary:     Effort: Pulmonary effort is normal. No respiratory distress.     Breath sounds: Normal breath sounds. No decreased breath sounds, wheezing or rhonchi.  Chest:     Chest wall: Tenderness present.  Abdominal:     Palpations: Abdomen is soft.     Tenderness: There is no abdominal tenderness.  Musculoskeletal: Normal range of motion.     Right lower leg: No edema.     Left lower leg: No edema.  Skin:    General: Skin is warm and dry.     Capillary Refill: Capillary refill takes less than 2 seconds.  Neurological:     General: No focal deficit present.     Mental Status: She is alert.      ED Treatments / Results  Labs (all labs ordered are listed, but only abnormal results are displayed) Labs Reviewed  BASIC METABOLIC PANEL - Abnormal; Notable for the following components:      Result Value   Glucose, Bld 104 (*)    All other components within normal limits  CBC WITH DIFFERENTIAL/PLATELET  LIPASE, BLOOD  TROPONIN I (HIGH SENSITIVITY)  TROPONIN I (HIGH SENSITIVITY)    EKG EKG Interpretation  Date/Time:  Thursday October 18 2019 09:56:09 EST Ventricular Rate:  55 PR Interval:    QRS Duration: 98 QT Interval:  404 QTC Calculation: 387 R Axis:   -47 Text Interpretation: Second degree AV block, Mobitz I, wenckebach Left anterior fascicular block Abnormal R-wave progression, late transition Left ventricular hypertrophy Confirmed by Lennice Sites [phone]) on 10/18/2019 10:34:49 AM Also confirmed by Lennice Sites [id number])  on 10/18/2019 10:35:03 AM   Radiology Dg Chest Portable 1 View  Result Date: 10/18/2019 CLINICAL DATA:  Cough. EXAM: PORTABLE CHEST 1 VIEW COMPARISON:  No  prior. FINDINGS: Mediastinum and hilar structures normal. Heart size upper limits normal. Lungs are clear. Tiny calcified nodules right lung base consistent granulomas. No pleural effusion or pneumothorax. No acute bony abnormality identified. IMPRESSION: No acute cardiopulmonary disease. Electronically Signed   By: Marcello Moores  Register   On:  10/18/2019 10:36    Procedures Procedures (including critical care time)  Medications Ordered in ED Medications - No data to display   Initial Impression / Assessment and Plan / ED Course  I have reviewed the triage vital signs and the nursing notes.  Pertinent labs & imaging results that were available during my care of the patient were reviewed by me and considered in my medical decision making (see chart for details).     Samara Wanser is an 85 year old female with history of high cholesterol who presents to the ED with chest pain.  Patient with overall unremarkable vitals.  No fever.  EKG shows second degree Mobitz 1 heart block.  Heart rate is between 50 and 70s in the room.  Confirm this Wenckebach rhythm with Dr. Davina Poke with cardiology.  Overall patient has a heart score of 3.  Has had chest pain for the last 3 days that was worse with movement.  Seems more muscular in nature.  Is not exertional.  Does not have diaphoresis or nausea with it.  Overall right now she does not have any chest pain.  Does not associate it with eating but does states she has a hiatal hernia.  She denies any infectious symptoms.  EKG showed no ischemic changes as well.  Chest x-ray showed no signs of pneumonia, no pneumothorax, no pleural effusion.  No significant anemia, electrolyte abnormality, kidney injury.  Troponin unremarkable x2.  Doubt ACS.  However Dr. Davina Poke with cardiology will follow up with her outpatient to see how patient is doing.  She has had a stress test in the past that was unremarkable.  Does not have any coronary disease that she is aware of.  Overall  remained chest pain-free throughout my care.  No concern for PE as she has no hypoxia, no shortness of breath.  Wells criteria 0.  Suspect musculoskeletal type pain and recommend Motrin, Tylenol, lidocaine patch.  Given return precautions and discharged from ED in good condition.  Recommend follow-up with primary care doctor as well.  This chart was dictated using voice recognition software.  Despite best efforts to proofread,  errors can occur which can change the documentation meaning.    Final Clinical Impressions(s) / ED Diagnoses   Final diagnoses:  Atypical chest pain    ED Discharge Orders    None       Lennice Sites, DO 10/18/19 1343

## 2019-10-18 NOTE — ED Triage Notes (Signed)
Pt describing chest pain into back and upper abdomen for past 3 days, slightly improved today, but does not go away.  Denies n/v

## 2019-10-18 NOTE — Telephone Encounter (Signed)
Thank you :)

## 2019-10-25 ENCOUNTER — Encounter: Payer: Self-pay | Admitting: Cardiovascular Disease

## 2019-10-25 ENCOUNTER — Telehealth (INDEPENDENT_AMBULATORY_CARE_PROVIDER_SITE_OTHER): Payer: Medicare Other | Admitting: Cardiovascular Disease

## 2019-10-25 ENCOUNTER — Telehealth: Payer: Self-pay

## 2019-10-25 VITALS — Ht 59.0 in | Wt 125.0 lb

## 2019-10-25 DIAGNOSIS — I444 Left anterior fascicular block: Secondary | ICD-10-CM

## 2019-10-25 DIAGNOSIS — I441 Atrioventricular block, second degree: Secondary | ICD-10-CM

## 2019-10-25 DIAGNOSIS — R079 Chest pain, unspecified: Secondary | ICD-10-CM | POA: Diagnosis not present

## 2019-10-25 NOTE — Patient Instructions (Signed)
Medication Instructions:  Your physician recommends that you continue on your current medications as directed. Please refer to the Current Medication list given to you today. *If you need a refill on your cardiac medications before your next appointment, please call your pharmacy*  Lab Work: NONE If you have labs (blood work) drawn today and your tests are completely normal, you will receive your results only by: Marland Kitchen MyChart Message (if you have MyChart) OR . A paper copy in the mail If you have any lab test that is abnormal or we need to change your treatment, we will call you to review the results.  Testing/Procedures: NONE  Follow-Up: At [facility], you and your health needs are our priority.  As part of our continuing mission to provide you with exceptional heart care, we have created designated Provider Care Teams.  These Care Teams include your primary Cardiologist (physician) and Advanced Practice Providers (APPs -  Physician Assistants and Nurse Practitioners) who all work together to provide you with the care you need, when you need it.  Your next appointment:   6 month(s)  The format for your next appointment:   In Person  Provider:   Eleonore Chiquito, MD  Other Instructions

## 2019-10-25 NOTE — Telephone Encounter (Addendum)
Contacted patient to discuss AVS Instructions. Gave patient Dr Kathalene Frames recommendations from today's virtual office visit. Informed patient that someone from the scheduling dept will be in contact with them to schedule their follow up appt. Patient voiced understanding and AVS mailed.    Patient notified to take Prilosec otc in the morning with your Levothyroxine med thirty minutes before breakfast. Patient voiced understanding.

## 2019-10-25 NOTE — Telephone Encounter (Signed)

## 2019-10-25 NOTE — Progress Notes (Signed)
Virtual Visit via Telephone Note   This visit type was conducted due to national recommendations for restrictions regarding the COVID-19 Pandemic (e.g. social distancing) in an effort to limit this patient's exposure and mitigate transmission in our community.  Due to her co-morbid illnesses, this patient is at least at moderate risk for complications without adequate follow up.  This format is felt to be most appropriate for this patient at this time.  The patient did not have access to video technology/had technical difficulties with video requiring transitioning to audio format only (telephone).  All issues noted in this document were discussed and addressed.  No physical exam could be performed with this format.  Please refer to the patient's chart for her  consent to telehealth for [facility].   Date:  10/25/2019   ID:  Cynthia Young, DOB 10/02/1930, MRN [id number]  Patient Location: Home Provider Location: Office  PCP:  Mosie Lukes, MD  Cardiologist:  Evalina Field, MD   Evaluation Performed:  New Patient Evaluation  Chief Complaint:  Chest pain  History of Present Illness:    Cynthia Young is a 85 y.o. female with HLD, hypothyroidism, hiatal hernia who presents for hospital follow-up of chest pain. She was evaluated 12/3 in the ER for atypical chest pain. Troponin negative x 2. EKG demonstrated sinus bradycardia (55 bpm), LAFB, and type 2 AV block Mobitz 1 (Wenckebach). She reports 1-2 monthly episodes of central chest pain. Pain described as dull pain. Can last up 4-5 hours.  She reports overeating and diets that are rich in fats exacerbate her pain.  The pain is alleviated by anti-acid medications. Reports no nausea, vomiting, shortness of breath. Reports episode 12/3 was associated with back pain. No further episodes. She states she was diagnosed with a hiatal hernia during a pregnancy. Has been recommended to take nexium but did not take.  It appears all of her episodes are  brought on by overeating or certain foods.  She has never been formally diagnosed with a hiatal hernia but this appears to have been diagnosed in the 1950s.  She has no exertional symptoms of chest pain or shortness of breath.  She has no lightheadedness with activity.  She is able to walk around grocery stores and at her home without any limitations.  She does not exercise regularly.  She has no prior history of myocardial infarction or stroke.  Review of laboratory data described below.  She is a never smoker and does not drink alcohol.  She recently moved to [place] from [place] to be closer to daughters.  Lipid profile for PCP T chol 220, HDL 59, LDL 137, TG 117  The patient does not have symptoms concerning for COVID-19 infection (fever, chills, cough, or new shortness of breath).    Past Medical History:  Diagnosis Date   Arthritis    Hyperlipidemia    Hypothyroid    Past Surgical History:  Procedure Laterality Date   BUNIONECTOMY Left    FOOT SURGERY Right    bunion     Current Meds  Medication Sig   Biotin 2500 MCG CAPS Take 5,000 mcg by mouth.   levothyroxine (SYNTHROID) 50 MCG tablet TAKE 1 TABLET BY MOUTH EVERY DAY     Allergies:   Patient has no known allergies.   Social History   Tobacco Use   Smoking status: Never Smoker   Smokeless tobacco: Never Used  Substance Use Topics   Alcohol use: Yes  Comment: socially   Drug use: Not on file     Family Hx: The patient's family history includes Arthritis in her father; Cancer in her brother and mother; Heart disease in her father.  ROS:   Please see the history of present illness.     All other systems reviewed and are negative.   Prior CV studies:   The following studies were reviewed today:  None  Labs/Other Tests and Data Reviewed:    EKG:  An ECG dated 10/18/2019 was personally reviewed today and demonstrated:  as below   EKG (10/18/2019) demonstrated sinus bradycardia (55 bpm),  LAFB, and type 2 AV block Mobitz 1 (Wenckebach), which is personally reviewed.  Lipid profile for PCP T chol 220, HDL 59, LDL 137, TG 117  Recent Labs: 08/02/2019: ALT 10; TSH 2.08 10/18/2019: BUN 11; Creatinine, Ser 0.71; Hemoglobin 14.1; Platelets 263; Potassium 4.4; Sodium 138   Recent Lipid Panel Lab Results  Component Value Date/Time   CHOL 220 (H) 08/02/2019 11:41 AM   TRIG 117.0 08/02/2019 11:41 AM   HDL 59.80 08/02/2019 11:41 AM   CHOLHDL 4 08/02/2019 11:41 AM   LDLCALC 137 (H) 08/02/2019 11:41 AM    Wt Readings from Last 3 Encounters:  10/25/19 125 lb (56.7 kg)  10/18/19 125 lb (56.7 kg)  09/05/19 122 lb (55.3 kg)     Objective:    Vital Signs:  Ht 4\' 11"  (1.499 m)    Wt 125 lb (56.7 kg)    BMI 25.25 kg/m    No vitals or physical performed due phone visit.   ASSESSMENT & PLAN:    1. Chest pain, unspecified type -Symptoms are pretty much consistent with GERD versus hiatal hernia.  She is never been formally diagnosed with a hiatal hernia but this is okay.  I recommend she start Prilosec 20 mg over-the-counter to see if this helps with her episodes.  She also will continue with conservative measures such as limiting overeating and avoiding certain foods.  She has no symptoms of exertional chest pain or shortness of breath.  Given her high HDL and overall good state of health I have a low suspicion for cardiac etiology.  We will plan to see her in person in 6 months to reassess her symptoms.  We can assess any further symptoms or testing that may be needed.  2. Mobitz type I Wenckebach atrioventricular block 3. LAFB (left anterior fascicular block) -Review of her EKG demonstrates benign second-degree AV block Mobitz type I.  She has no symptoms of lightheadedness or dizziness with activity.  Her EKG does not demonstrate any wide QRS to suggest underlying significant conduction disease.  Her QRS is narrow and she simply has left anterior fascicular block.  We will plan to  follow this for now.  I advised her to avoid any AV nodal agents moving forward such as beta-blockers or calcium channel blockers.  She does not take any hypertensive medications but will clear these with me if she is ever started on a blood pressure medications.  We will plan to see her in office in 6 months for full cardiovascular examination and we can assess any further testing needed at that time.   COVID-19 Education: The signs and symptoms of COVID-19 were discussed with the patient and how to seek care for testing (follow up with PCP or arrange E-visit).  The importance of social distancing was discussed today.  Time:   Today, I have spent 35 minutes with the patient with  telehealth technology discussing the above problems.     Medication Adjustments/Labs and Tests Ordered: Current medicines are reviewed at length with the patient today.  Concerns regarding medicines are outlined above.   Tests Ordered: No orders of the defined types were placed in this encounter.   Medication Changes: No orders of the defined types were placed in this encounter.   Follow Up:  In Person in 6 month(s)  Signed, Evalina Field, MD  10/25/2019 8:40 AM    [place]

## 2019-10-26 ENCOUNTER — Ambulatory Visit: Payer: Medicare Other | Admitting: Cardiovascular Disease

## 2019-11-15 ENCOUNTER — Ambulatory Visit: Payer: Self-pay

## 2019-11-15 NOTE — Telephone Encounter (Signed)
FYI

## 2019-11-15 NOTE — Telephone Encounter (Signed)
Returned call to patient who states that for the last 3 days she has had headache, body aches, runny nose.  She reports chills. She states that she has chest pain or discomfort but this is better today. She has had chills at night.  She states he symptoms get better during the day and worse at night. She was with family over the Christmas holiday but no exposure to a positive covid-19 family member.  She states that she has not spoken with her family since they went home and is unaware that anyone is sick.  She is unsure if she had her flu vaccine this year. She denies breathing issues. Per protocol patient will go to ER. ER attending Dr. notified of patient symptoms. Care advice read to patient.  She verbalized understanding. Reason for Disposition . Chest pain or pressure  Answer Assessment - Initial Assessment Questions 1. COVID-19 DIAGNOSIS: "Who made your Coronavirus (COVID-19) diagnosis?" "Was it confirmed by a positive lab test?" If not diagnosed by a HCP, ask "Are there lots of cases (community spread) where you live?" (See public health department website, if unsure)     [place] 2. COVID-19 EXPOSURE: "Was there any known exposure to COVID before the symptoms began?" CDC Definition of close contact: within 6 feet (2 meters) for a total of 15 minutes or more over a 24-hour period.     Non known 3. ONSET: "When did the COVID-19 symptoms start?"     3 days ago 4. WORST SYMPTOM: "What is your worst symptom?" (e.g., cough, fever, shortness of breath, muscle aches)   Headache runny nose body aches headaches 5. COUGH: "Do you have a cough?" If so, ask: "How bad is the cough?"       minimal 6. FEVER: "Do you have a fever?" If so, ask: "What is your temperature, how was it measured, and when did it start?"   Unsure 7. RESPIRATORY STATUS: "Describe your breathing?" (e.g., shortness of breath, wheezing, unable to speak)     None 8. BETTER-SAME-WORSE: "Are you getting better, staying the same or  getting worse compared to yesterday?"  If getting worse, ask, "In what way?"    Worse at night 9. HIGH RISK DISEASE: "Do you have any chronic medical problems?" (e.g., asthma, heart or lung disease, weak immune system, obesity, etc.)   None 10. PREGNANCY: "Is there any chance you are pregnant?" "When was your last menstrual period?"     n/a 11. OTHER SYMPTOMS: "Do you have any other symptoms?"  (e.g., chills, fatigue, headache, loss of smell or taste, muscle pain, sore throat; new loss of smell or taste especially support the diagnosis of COVID-19)      Headache ,sore throat, tired  Protocols used: CORONAVIRUS (COVID-19) DIAGNOSED OR SUSPECTED-A-AH

## 2019-11-15 NOTE — Telephone Encounter (Signed)
Thanks

## 2020-01-14 DIAGNOSIS — Z23 Encounter for immunization: Secondary | ICD-10-CM | POA: Diagnosis not present

## 2020-01-28 ENCOUNTER — Other Ambulatory Visit: Payer: Self-pay

## 2020-01-31 ENCOUNTER — Ambulatory Visit: Payer: Medicare Other | Admitting: Family Medicine

## 2020-02-02 DIAGNOSIS — H66001 Acute suppurative otitis media without spontaneous rupture of ear drum, right ear: Secondary | ICD-10-CM | POA: Diagnosis not present

## 2020-02-04 ENCOUNTER — Telehealth: Payer: Self-pay | Admitting: *Deleted

## 2020-02-04 ENCOUNTER — Telehealth: Payer: Self-pay

## 2020-02-04 NOTE — Telephone Encounter (Signed)
Relationship To Patient Self Return Phone Number [phone] (Primary) Chief Complaint Earache Reason for Call Symptomatic / Request for Health Information Initial Comment Caller states she has an earache and sore throat. Translation No Nurse Assessment Nurse: Joya San, RN, [place] Date/Time Eilene Ghazi Time): 02/02/2020 7:02:13 AM Confirm and document reason for call. If symptomatic, describe symptoms. ---Caller states she has an earache and sore throat, stating that it's all on the right side. This is day 3 of symptoms. Has gargled with salt, has done peroxide in her ear.
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
## 2020-02-04 NOTE — Telephone Encounter (Signed)
Called patient to schedule and she stated that she went to urgent care over the weekend.

## 2020-02-07 ENCOUNTER — Ambulatory Visit (INDEPENDENT_AMBULATORY_CARE_PROVIDER_SITE_OTHER): Payer: Medicare Other | Admitting: Family Medicine

## 2020-02-07 ENCOUNTER — Other Ambulatory Visit: Payer: Self-pay

## 2020-02-07 ENCOUNTER — Encounter: Payer: Self-pay | Admitting: Family Medicine

## 2020-02-07 DIAGNOSIS — E039 Hypothyroidism, unspecified: Secondary | ICD-10-CM

## 2020-02-07 DIAGNOSIS — J029 Acute pharyngitis, unspecified: Secondary | ICD-10-CM

## 2020-02-07 DIAGNOSIS — E785 Hyperlipidemia, unspecified: Secondary | ICD-10-CM

## 2020-02-07 MED ORDER — AMOXICILLIN 875 MG PO TABS: 875.0000 mg | ORAL_TABLET | Freq: Two times a day (BID) | ORAL | 0 refills | Status: DC

## 2020-02-07 NOTE — Patient Instructions (Addendum)
Sarna anti itch lotion  Omron Blood Pressure cuff, upper arm, want BP 100-140/60-90 Pulse oximeter, want oxygen in 90s  Weekly vitals  Take Multivitamin with minerals, selenium Vitamin D 1000-2000 IU daily Probiotic with lactobacillus and bifidophilus Asprin EC 81 mg daily Fish or krill oil caps daily Melatonin 2-5 mg at bedtime  [email or web address]    Pharyngitis  Pharyngitis is redness, pain, and swelling (in  flammation) of the throat (pharynx). It is a very common cause of sore throat. Pharyngitis can be caused by a bacteria, but it is usually caused by a virus. Most cases of pharyngitis get better on their own without treatment. What are the causes? This condition may be caused by:  Infection by viruses (viral). Viral pharyngitis spreads from person to person (is contagious) through coughing, sneezing, and sharing of personal items or utensils such as cups, forks, spoons, and toothbrushes.  Infection by bacteria (bacterial). Bacterial pharyngitis may be spread by touching the nose or face after coming in contact with the bacteria, or through more intimate contact, such as kissing.  Allergies. Allergies can cause buildup of mucus in the throat (post-nasal drip), leading to inflammation and irritation. Allergies can also cause blocked nasal passages, forcing breathing through the mouth, which dries and irritates the throat. What increases the risk? You are more likely to develop this condition if:  You are 57-75 years old.  You are exposed to crowded environments such as daycare, school, or dormitory living.  You live in a cold climate.  You have a weakened disease-fighting (immune) system. What are the signs or symptoms? Symptoms of this condition vary by the cause (viral, bacterial, or allergies) and can include:  Sore throat.  Fatigue.  Low-grade fever.  Headache.  Joint pain and muscle aches.  Skin rashes.  Swollen  glands in the throat (lymph nodes).  Plaque-like film on the throat or tonsils. This is often a symptom of bacterial pharyngitis.  Vomiting.  Stuffy nose (nasal congestion).  Cough.  Red, itchy eyes (conjunctivitis).  Loss of appetite. How is this diagnosed? This condition is often diagnosed based on your medical history and a physical exam. Your health care provider will ask you questions about your illness and your symptoms. A swab of your throat may be done to check for bacteria (rapid strep test). Other lab tests may also be done, depending on the suspected cause, but these are rare. How is this treated? This condition usually gets better in 3-4 days without medicine. Bacterial pharyngitis may be treated with antibiotic medicines. Follow these instructions at home:  Take over-the-counter and prescription medicines only as told by your health care provider. ? If you were prescribed an antibiotic medicine, take it as told by your health care provider. Do not stop taking the antibiotic even if you start to feel better. ? Do not give children aspirin because of the association with Reye syndrome.  Drink enough water and fluids to keep your urine clear or pale yellow.  Get a lot of rest.  Gargle with a salt-water mixture 3-4 times a day or as needed. To make a salt-water mixture, completely dissolve -1 tsp of salt in 1 cup of warm water.  If your health care provider approves, you may use throat lozenges or sprays to soothe your throat. Contact a health care provider if:  You have large, tender lumps in your neck.  You have a rash.  You cough up green, yellow-brown, or bloody spit. Get help right away if:  Your neck becomes stiff.  You drool or are unable to swallow liquids.  You cannot drink or take medicines without vomiting.  You have severe pain that does not go away, even after you take medicine.  You have trouble breathing, and it is not caused by a stuffy  nose.  You have new pain and swelling in your joints such as the knees, ankles, wrists, or elbows. Summary  Pharyngitis is redness, pain, and swelling (inflammation) of the throat (pharynx).  While pharyngitis can be caused by a bacteria, the most common causes are viral.  Most cases of pharyngitis get better on their own without treatment.  Bacterial pharyngitis is treated with antibiotic medicines. This information is not intended to replace advice given to you by your health care provider. Make sure you discuss any questions you have with your health care provider. Document Revised: 10/14/2017 Document Reviewed: 12/07/2016 Elsevier Patient Education  2020 [place] and drink 60 to 80 water   Omron Blood Pressure cuff, upper arm, want BP 100-140/60-90 Pulse oximeter, want oxygen in 90s  Weekly vitals  Take Multivitamin with minerals, selenium Vitamin D 1000-2000 IU daily Probiotic with lactobacillus and bifidophilus Asprin EC 81 mg daily Fish or Krill oil caps daily Melatonin 2-5 mg at bedtime  [email or web address]

## 2020-02-10 DIAGNOSIS — J029 Acute pharyngitis, unspecified: Secondary | ICD-10-CM | POA: Insufficient documentation

## 2020-02-10 NOTE — Assessment & Plan Note (Deleted)
Encouraged heart healthy diet, increase exercise, avoid trans fats, consider a krill oil cap daily 

## 2020-02-10 NOTE — Progress Notes (Signed)
Subjective:    Patient ID: Cynthia Young, female    DOB: 31-Dec-1929, 86 y.o.   MRN: [id number]  Chief Complaint  Patient presents with  . Ear Pain    Diagn[facility] with [facility], on Amoxicillin 850 mg  . Sore Throat    Complains of sore throat    HPI Patient is in today for evaluation of pharyngitis. She was seen at Urgent Care and started on Amoxicillin and she is feeling some better. She does note some ear pain, right as well. It is also improving. No fevers or chills. Denies CP/palp/SOB/HA/congestion/fevers/GI or GU c/o. Taking meds as prescribed  Past Medical History:  Diagnosis Date  . Arthritis   . Hyperlipidemia   . Hypothyroid     Past Surgical History:  Procedure Laterality Date  . BUNIONECTOMY Left   . FOOT SURGERY Right    bunion    Family History  Problem Relation Age of Onset  . Cancer Mother   . Heart disease Father   . Arthritis Father        rheumatoid  . Cancer Brother        colon    Social History   Socioeconomic History  . Marital status: Widowed    Spouse name: Not on file  . Number of children: Not on file  . Years of education: Not on file  . Highest education level: Not on file  Occupational History  . Not on file  Tobacco Use  . Smoking status: Never Smoker  . Smokeless tobacco: Never Used  Substance and Sexual Activity  . Alcohol use: Yes    Comment: socially  . Drug use: Not on file  . Sexual activity: Not Currently  Other Topics Concern  . Not on file  Social History Narrative   worked in [employer], short time. No cigarettes or drug use. Live by self no dietary restictions and walks daily   Social Determinants of Health   Financial Resource Strain:   . Difficulty of Paying Living Expenses:   Food Insecurity:   . Worried About Charity fundraiser in the Last Year:   . Arboriculturist in the Last Year:   Transportation Needs:   . Film/video editor (Medical):   Marland Kitchen Lack of Transportation  (Non-Medical):   Physical Activity:   . Days of Exercise per Week:   . Minutes of Exercise per Session:   Stress:   . Feeling of Stress :   Social Connections:   . Frequency of Communication with Friends and Family:   . Frequency of Social Gatherings with Friends and Family:   . Attends Religious Services:   . Active Member of Clubs or Organizations:   . Attends Archivist Meetings:   Marland Kitchen Marital Status:   Intimate Partner Violence:   . Fear of Current or Ex-Partner:   . Emotionally Abused:   Marland Kitchen Physically Abused:   . Sexually Abused:     Outpatient Medications Prior to Visit  Medication Sig Dispense Refill  . Biotin 2500 MCG CAPS Take 5,000 mcg by mouth.    . levothyroxine (SYNTHROID) 50 MCG tablet TAKE 1 TABLET BY MOUTH EVERY DAY 90 tablet 1  . omeprazole (PRILOSEC OTC) 20 MG tablet Take 20 mg by mouth daily.    Marland Kitchen amoxicillin (AMOXIL) 875 MG tablet Take 875 mg by mouth 2 (two) times daily.     No facility-administered medications prior to visit.    No Known  Allergies  Review of Systems  Constitutional: Positive for malaise/fatigue. Negative for fever.  HENT: Positive for ear pain and sore throat. Negative for congestion.   Eyes: Negative for blurred vision.  Respiratory: Negative for shortness of breath.   Cardiovascular: Negative for chest pain, palpitations and leg swelling.  Gastrointestinal: Negative for abdominal pain, blood in stool and nausea.  Genitourinary: Negative for dysuria and frequency.  Musculoskeletal: Negative for falls.  Skin: Negative for rash.  Neurological: Negative for dizziness, loss of consciousness and headaches.  Endo/Heme/Allergies: Negative for environmental allergies.  Psychiatric/Behavioral: Negative for depression. The patient is not nervous/anxious.        Objective:    Physical Exam Vitals and nursing note reviewed.  Constitutional:      General: She is not in acute distress.    Appearance: She is well-developed.  HENT:      Head: Normocephalic and atraumatic.     Left Ear: Tympanic membrane normal.     Ears:     Comments: Right TM dull and pink    Nose: Nose normal.  Eyes:     General:        Right eye: No discharge.        Left eye: No discharge.  Cardiovascular:     Rate and Rhythm: Normal rate and regular rhythm.     Heart sounds: No murmur.  Pulmonary:     Effort: Pulmonary effort is normal.     Breath sounds: Normal breath sounds.  Abdominal:     General: Bowel sounds are normal.     Palpations: Abdomen is soft.     Tenderness: There is no abdominal tenderness.  Musculoskeletal:     Cervical back: Normal range of motion and neck supple.  Skin:    General: Skin is warm and dry.  Neurological:     Mental Status: She is alert and oriented to person, place, and time.     BP (!) 147/61 (BP Location: Right Arm, Patient Position: Sitting, Cuff Size: Small)   Pulse 68   Temp (!) 97.5 F (36.4 C) (Temporal)   Resp 16   Ht 4\' 11"  (1.499 m)   Wt 123 lb (55.8 kg)   SpO2 99%   BMI 24.84 kg/m  Wt Readings from Last 3 Encounters:  02/07/20 123 lb (55.8 kg)  10/25/19 125 lb (56.7 kg)  10/18/19 125 lb (56.7 kg)    Diabetic Foot Exam - Simple   No data filed     Lab Results  Component Value Date   WBC 6.6 10/18/2019   HGB 14.1 10/18/2019   HCT 45.3 10/18/2019   PLT 263 10/18/2019   GLUCOSE 104 (H) 10/18/2019   CHOL 220 (H) 08/02/2019   TRIG 117.0 08/02/2019   HDL 59.80 08/02/2019   LDLCALC 137 (H) 08/02/2019   ALT 10 08/02/2019   AST 15 08/02/2019   NA 138 10/18/2019   K 4.4 10/18/2019   CL 107 10/18/2019   CREATININE 0.71 10/18/2019   BUN 11 10/18/2019   CO2 25 10/18/2019   TSH 2.08 08/02/2019    Lab Results  Component Value Date   TSH 2.08 08/02/2019   Lab Results  Component Value Date   WBC 6.6 10/18/2019   HGB 14.1 10/18/2019   HCT 45.3 10/18/2019   MCV 96.2 10/18/2019   PLT 263 10/18/2019   Lab Results  Component Value Date   NA 138 10/18/2019   K 4.4  10/18/2019   CO2 25 10/18/2019   GLUCOSE  104 (H) 10/18/2019   BUN 11 10/18/2019   CREATININE 0.71 10/18/2019   BILITOT 0.5 08/02/2019   ALKPHOS 61 08/02/2019   AST 15 08/02/2019   ALT 10 08/02/2019   PROT 6.9 08/02/2019   ALBUMIN 4.3 08/02/2019   CALCIUM 9.6 10/18/2019   ANIONGAP 6 10/18/2019   GFR 64.06 08/02/2019   Lab Results  Component Value Date   CHOL 220 (H) 08/02/2019   Lab Results  Component Value Date   HDL 59.80 08/02/2019   Lab Results  Component Value Date   LDLCALC 137 (H) 08/02/2019   Lab Results  Component Value Date   TRIG 117.0 08/02/2019   Lab Results  Component Value Date   CHOLHDL 4 08/02/2019   No results found for: HGBA1C     Assessment & Plan:   Problem List Items Addressed This Visit    Hypothyroid    On Levothyroxine, continue to monitor      Hyperlipidemia   Pharyngitis    She was seen at urgent a few days ago and started on Amoxicillin for only for 7 days and she has noted some improvement. She is given a second week of Amoxicillin. She should start probiotic and report if her symptoms do not improve.          I have changed Cyani Santoni's amoxicillin. I am also having her maintain her Biotin, levothyroxine, and omeprazole.  Meds ordered this encounter  Medications  . amoxicillin (AMOXIL) 875 MG tablet    Sig: Take 1 tablet (875 mg total) by mouth 2 (two) times daily.    Dispense:  14 tablet    Refill:  0     Penni Homans, MD

## 2020-02-10 NOTE — Assessment & Plan Note (Signed)
She was seen at urgent a few days ago and started on Amoxicillin for only for 7 days and she has noted some improvement. She is given a second week of Amoxicillin. She should start probiotic and report if her symptoms do not improve.

## 2020-02-10 NOTE — Assessment & Plan Note (Signed)
On Levothyroxine, continue to monitor 

## 2020-02-15 ENCOUNTER — Encounter (HOSPITAL_BASED_OUTPATIENT_CLINIC_OR_DEPARTMENT_OTHER): Payer: Self-pay | Admitting: Emergency Medicine

## 2020-02-15 ENCOUNTER — Other Ambulatory Visit: Payer: Self-pay

## 2020-02-15 ENCOUNTER — Emergency Department (HOSPITAL_BASED_OUTPATIENT_CLINIC_OR_DEPARTMENT_OTHER)
Admission: EM | Admit: 2020-02-15 | Discharge: 2020-02-15 | Disposition: A | Discharge status: Home / Self Care | Payer: Medicare Other | Attending: Emergency Medicine | Admitting: Emergency Medicine

## 2020-02-15 DIAGNOSIS — J029 Acute pharyngitis, unspecified: Secondary | ICD-10-CM

## 2020-02-15 DIAGNOSIS — Z79899 Other long term (current) drug therapy: Secondary | ICD-10-CM | POA: Insufficient documentation

## 2020-02-15 DIAGNOSIS — R6883 Chills (without fever): Secondary | ICD-10-CM | POA: Insufficient documentation

## 2020-02-15 DIAGNOSIS — E039 Hypothyroidism, unspecified: Secondary | ICD-10-CM | POA: Insufficient documentation

## 2020-02-15 LAB — URINALYSIS, ROUTINE W REFLEX MICROSCOPIC
Bilirubin Urine: NEGATIVE
Glucose, UA: NEGATIVE mg/dL
Hgb urine dipstick: NEGATIVE
Ketones, ur: NEGATIVE mg/dL
Nitrite: NEGATIVE
Protein, ur: NEGATIVE mg/dL
Specific Gravity, Urine: 1.015 (ref 1.005–1.030)
pH: 6 (ref 5.0–8.0)

## 2020-02-15 LAB — URINALYSIS, MICROSCOPIC (REFLEX)

## 2020-02-15 MED ORDER — PANTOPRAZOLE SODIUM 40 MG PO TBEC
40.0000 mg | DELAYED_RELEASE_TABLET | Freq: Once | ORAL | Status: DC
  Filled 2020-02-15: qty 1

## 2020-02-15 NOTE — ED Provider Notes (Signed)
[facility] Provider Note: Georgena Spurling, MD, FACEP  CSN: [id number] MRN: [id number] ARRIVAL: 02/15/20 at [place]: [place] PRESENT ILLNESS  02/15/20 2:52 AM Cynthia Young is a 86 y.o. female who has been on amoxicillin for almost 2 weeks for right ear pain and sore throat.  Her right ear pain is improved but she still feels some throbbing in the right ear.  She is here because she awakened just prior to arrival with shaking chills and a generalized sense of weakness.  She did not have a fever.  She had been having abdominal discomfort for the past several days which she described as gas or heartburn.  She took a bottle of magnesium citrate a day ago which caused passage of loose stools and improvement in her abdominal discomfort.  She denies any dysuria, nausea or vomiting.   Past Medical History:  Diagnosis Date  . Arthritis   . Hyperlipidemia   . Hypothyroid     Past Surgical History:  Procedure Laterality Date  . BUNIONECTOMY Left   . FOOT SURGERY Right    bunion    Family History  Problem Relation Age of Onset  . Cancer Mother   . Heart disease Father   . Arthritis Father        rheumatoid  . Cancer Brother        colon    Social History   Tobacco Use  . Smoking status: Never Smoker  . Smokeless tobacco: Never Used  Substance Use Topics  . Alcohol use: Yes    Comment: socially  . Drug use: Not on file    Prior to Admission medications   Medication Sig Start Date End Date Taking? Authorizing Provider  amoxicillin (AMOXIL) 875 MG tablet Take 1 tablet (875 mg total) by mouth 2 (two) times daily. 02/07/20   Mosie Lukes, MD  Biotin 2500 MCG CAPS Take 5,000 mcg by mouth.    [provider]  levothyroxine (SYNTHROID) 50 MCG tablet TAKE 1 TABLET BY MOUTH EVERY DAY 05/28/19   Mosie Lukes, MD  omeprazole (PRILOSEC OTC) 20 MG tablet Take 20 mg by mouth daily.    [provider]     Allergies Patient has no known allergies.   REVIEW OF SYSTEMS  Negative except as noted here or in the History of Present Illness.   PHYSICAL EXAMINATION  Initial Vital Signs Blood pressure (!) 160/67, pulse (!) 56, temperature 97.9 F (36.6 C), temperature source Oral, resp. rate 18, height 4\' 11"  (1.499 m), weight 56.2 kg, SpO2 100 %.  Examination General: Well-developed, well-nourished female in no acute distress; appearance consistent with age of record HENT: normocephalic; atraumatic; TMs normal except small piece of wax adherent to right TM; no pharyngeal erythema or exudate Eyes: pupils equal, round and reactive to light; extraocular muscles intact Neck: supple Heart: regular rate and rhythm Lungs: clear to auscultation bilaterally Abdomen: soft; nondistended; nontender; bowel sounds present Extremities: No deformity; full range of motion; trace edema of lower legs Neurologic: Awake, alert and oriented; motor function intact in all extremities and symmetric; no facial droop Skin: Warm and dry Psychiatric: Normal mood and affect   RESULTS  Summary of this visit's results, reviewed and interpreted by myself:   EKG Interpretation  Date/Time:    Ventricular Rate:    PR Interval:    QRS Duration:   QT Interval:    QTC Calculation:   R Axis:  Text Interpretation:        Laboratory Studies: Results for orders placed or performed during the hospital encounter of 02/15/20 (from the past 24 hour(s))  Urinalysis, Routine w reflex microscopic     Status: Abnormal   Collection Time: 02/15/20  4:03 AM  Result Value Ref Range   Color, Urine YELLOW YELLOW   APPearance CLEAR CLEAR   Specific Gravity, Urine 1.015 1.005 - 1.030   pH 6.0 5.0 - 8.0   Glucose, UA NEGATIVE NEGATIVE mg/dL   Hgb urine dipstick NEGATIVE NEGATIVE   Bilirubin Urine NEGATIVE NEGATIVE   Ketones, ur NEGATIVE NEGATIVE mg/dL   Protein, ur NEGATIVE NEGATIVE mg/dL   Nitrite NEGATIVE NEGATIVE    Leukocytes,Ua TRACE (A) NEGATIVE  Urinalysis, Microscopic (reflex)     Status: Abnormal   Collection Time: 02/15/20  4:03 AM  Result Value Ref Range   RBC / HPF 0-5 0 - 5 RBC/hpf   WBC, UA 0-5 0 - 5 WBC/hpf   Bacteria, UA RARE (A) NONE SEEN   Squamous Epithelial / LPF 0-5 0 - 5   Imaging Studies: No results found.  ED COURSE and MDM  Nursing notes, initial and subsequent vitals signs, including pulse oximetry, reviewed and interpreted by myself.  Vitals:   02/15/20 0243 02/15/20 0244 02/15/20 0245  BP:  (!) 160/67 (!) 160/67  Pulse:  (!) 56 (!) 55  Resp:  18   Temp:  97.9 F (36.6 C)   TempSrc:  Oral   SpO2:  100% 100%  Weight: 56.2 kg    Height: 4\' 11"  (1.499 m)     Medications  pantoprazole (PROTONIX) EC tablet 40 mg (has no administration in time range)    The patient's urinalysis is reassuring with no evidence of urinary tract infection.  The patient is afebrile and nontoxic-appearing.  The cause of her chills is unclear.  Her sore throat may be due in part to acid reflux.  She has a known hiatal hernia and has not been compliant with her Nexium.  She was encouraged to restart the Nexium.  PROCEDURES  Procedures   ED DIAGNOSES     ICD-10-CM   1. Chills  R68.83   2. Sore throat  J02.9        Hadley Detloff, MD 02/15/20 [phone]

## 2020-02-15 NOTE — ED Notes (Signed)
Patient aware of need for urine. Unable to provide sample at this time. Provider aware.

## 2020-02-15 NOTE — ED Triage Notes (Signed)
Pt states she woke up shaking and felt weak. Pt is being treated with amoxicillin for sore throat and ear ache. Pt also reports abd pain that started 4 days ago.

## 2020-02-22 DIAGNOSIS — Z23 Encounter for immunization: Secondary | ICD-10-CM | POA: Diagnosis not present

## 2020-02-27 ENCOUNTER — Other Ambulatory Visit: Payer: Self-pay

## 2020-02-28 ENCOUNTER — Other Ambulatory Visit: Payer: Self-pay

## 2020-02-28 ENCOUNTER — Ambulatory Visit (INDEPENDENT_AMBULATORY_CARE_PROVIDER_SITE_OTHER): Payer: Medicare Other | Admitting: Family Medicine

## 2020-02-28 VITALS — BP 118/70 | HR 53 | Temp 97.4°F | Resp 12 | Ht 59.0 in | Wt 123.4 lb

## 2020-02-28 DIAGNOSIS — E039 Hypothyroidism, unspecified: Secondary | ICD-10-CM

## 2020-02-28 DIAGNOSIS — J029 Acute pharyngitis, unspecified: Secondary | ICD-10-CM | POA: Diagnosis not present

## 2020-02-28 DIAGNOSIS — K219 Gastro-esophageal reflux disease without esophagitis: Secondary | ICD-10-CM

## 2020-02-28 DIAGNOSIS — E785 Hyperlipidemia, unspecified: Secondary | ICD-10-CM | POA: Diagnosis not present

## 2020-02-28 DIAGNOSIS — M791 Myalgia, unspecified site: Secondary | ICD-10-CM

## 2020-02-28 DIAGNOSIS — R739 Hyperglycemia, unspecified: Secondary | ICD-10-CM

## 2020-02-28 DIAGNOSIS — K449 Diaphragmatic hernia without obstruction or gangrene: Secondary | ICD-10-CM

## 2020-02-28 LAB — CBC
HCT: 42.3 % (ref 36.0–46.0)
Hemoglobin: 13.8 g/dL (ref 12.0–15.0)
MCHC: 32.6 g/dL (ref 30.0–36.0)
MCV: 91.7 fl (ref 78.0–100.0)
Platelets: 288 10*3/uL (ref 150.0–400.0)
RBC: 4.61 Mil/uL (ref 3.87–5.11)
RDW: 13.8 % (ref 11.5–15.5)
WBC: 7.7 10*3/uL (ref 4.0–10.5)

## 2020-02-28 LAB — COMPREHENSIVE METABOLIC PANEL
ALT: 11 U/L (ref 0–35)
AST: 17 U/L (ref 0–37)
Albumin: 4.2 g/dL (ref 3.5–5.2)
Alkaline Phosphatase: 62 U/L (ref 39–117)
BUN: 14 mg/dL (ref 6–23)
CO2: 29 mEq/L (ref 19–32)
Calcium: 9.8 mg/dL (ref 8.4–10.5)
Chloride: 105 mEq/L (ref 96–112)
Creatinine, Ser: 0.84 mg/dL (ref 0.40–1.20)
GFR: 63.97 mL/min (ref >60.00)
Glucose, Bld: 93 mg/dL (ref 70–99)
Potassium: 5.2 mEq/L — ABNORMAL HIGH (ref 3.5–5.1)
Sodium: 141 mEq/L (ref 135–145)
Total Bilirubin: 0.5 mg/dL (ref 0.2–1.2)
Total Protein: 6.7 g/dL (ref 6.0–8.3)

## 2020-02-28 LAB — LIPID PANEL
Cholesterol: 204 mg/dL — ABNORMAL HIGH (ref 0–200)
HDL: 51.6 mg/dL (ref >39.00)
LDL Cholesterol: 134 mg/dL — ABNORMAL HIGH (ref 0–99)
NonHDL: 151.91
Total CHOL/HDL Ratio: 4
Triglycerides: 89 mg/dL (ref 0.0–149.0)
VLDL: 17.8 mg/dL (ref 0.0–40.0)

## 2020-02-28 LAB — MAGNESIUM: Magnesium: 2.2 mg/dL (ref 1.5–2.5)

## 2020-02-28 LAB — HEMOGLOBIN A1C: Hgb A1c MFr Bld: 6.1 % (ref 4.6–6.5)

## 2020-02-28 LAB — TSH: TSH: 1.48 u[IU]/mL (ref 0.35–4.50)

## 2020-02-28 NOTE — Patient Instructions (Signed)
Varicose Veins Varicose veins are veins that have become enlarged, bulged, and twisted. They most often appear in the legs. What are the causes? This condition is caused by damage to the valves in the vein. These valves help blood return to your heart. When they are damaged and they stop working properly, blood may flow backward and back up in the veins near the skin, causing the veins to get larger and appear twisted. The condition can result from any issue that causes blood to back up, like pregnancy, prolonged standing, or obesity. What increases the risk? This condition is more likely to develop in people who are:  On their feet a lot.  Pregnant.  Overweight. What are the signs or symptoms? Symptoms of this condition include:  Bulging, twisted, and bluish veins.  A feeling of heaviness. This may be worse at the end of the day.  Leg pain. This may be worse at the end of the day.  Swelling in the leg.  Changes in skin color over the veins. How is this diagnosed? This condition may be diagnosed based on your symptoms, a physical exam, and an ultrasound test. How is this treated? Treatment for this condition may involve:  Avoiding sitting or standing in one position for long periods of time.  Wearing compression stockings. These stockings help to prevent blood clots and reduce swelling in the legs.  Raising (elevating) the legs when resting.  Losing weight.  Exercising regularly. If you have persistent symptoms or want to improve the way your varicose veins look, you may choose to have a procedure to close the varicose veins off or to remove them. Treatments to close off the veins include:  Sclerotherapy. In this treatment, a solution is injected into a vein to close it off.  Laser treatment. In this treatment, the vein is heated with a laser to close it off.  Radiofrequency vein ablation. In this treatment, an electrical current produced by radio waves is used to close  off the vein. Treatments to remove the veins include:  Phlebectomy. In this treatment, the veins are removed through small incisions made over the veins.  Vein ligation and stripping. In this treatment, incisions are made over the veins. The veins are then removed after being tied (ligated) with stitches (sutures). Follow these instructions at home: Activity  Walk as much as possible. Walking increases blood flow. This helps blood return to the heart and takes pressure off your veins. It also increases your cardiovascular strength.  Follow your health care provider's instructions about exercising.  Do not stand or sit in one position for a long period of time.  Do not sit with your legs crossed.  Rest with your legs raised during the day. General instructions   Follow any diet instructions given to you by your health care provider.  Wear compression stockings as directed by your health care provider. Do not wear other kinds of tight clothing around your legs, pelvis, or waist.  Elevate your legs at night to above the level of your heart.  If you get a cut in the skin over the varicose vein and the vein bleeds: ? Lie down with your leg raised. ? Apply firm pressure to the cut with a clean cloth until the bleeding stops. ? Place a bandage (dressing) on the cut. Contact a health care provider if:  The skin around your varicose veins starts to break down.  You have pain, redness, tenderness, or hard swelling over a vein.  You   are uncomfortable because of pain.  You get a cut in the skin over a varicose vein and it will not stop bleeding. Summary  Varicose veins are veins that have become enlarged, bulged, and twisted. They most often appear in the legs.  This condition is caused by damage to the valves in the vein. These valves help blood return to your heart.  Treatment for this condition includes frequent movements, wearing compression stockings, losing weight, and  exercising regularly. In some cases, procedures are done to close off or remove the veins.  Treatment for this condition may include wearing compression stockings, elevating the legs, losing weight, and engaging in regular activity. In some cases, procedures are done to close off or remove the veins. This information is not intended to replace advice given to you by your health care provider. Make sure you discuss any questions you have with your health care provider. Document Revised: 12/28/2018 Document Reviewed: 11/24/2016 Elsevier Patient Education  2020 [employer]  

## 2020-02-29 ENCOUNTER — Other Ambulatory Visit: Payer: Self-pay | Admitting: *Deleted

## 2020-02-29 DIAGNOSIS — L219 Seborrheic dermatitis, unspecified: Secondary | ICD-10-CM | POA: Diagnosis not present

## 2020-02-29 DIAGNOSIS — L918 Other hypertrophic disorders of the skin: Secondary | ICD-10-CM | POA: Diagnosis not present

## 2020-02-29 DIAGNOSIS — E875 Hyperkalemia: Secondary | ICD-10-CM

## 2020-02-29 DIAGNOSIS — L814 Other melanin hyperpigmentation: Secondary | ICD-10-CM | POA: Diagnosis not present

## 2020-02-29 DIAGNOSIS — L821 Other seborrheic keratosis: Secondary | ICD-10-CM | POA: Diagnosis not present

## 2020-03-01 NOTE — Assessment & Plan Note (Signed)
Encouraged heart healthy diet, increase exercise, avoid trans fats, consider a krill oil cap daily 

## 2020-03-01 NOTE — Assessment & Plan Note (Signed)
On Levothyroxine, continue to monitor 

## 2020-03-01 NOTE — Progress Notes (Signed)
Subjective:    Patient ID: Cynthia Young, female    DOB: [birth year], 86 y.o.   MRN: [id number]  Chief Complaint  Patient presents with  . 6 month follow up    HPI Patient is in today for ER follow up. She presented to the ER recently with c/o sore throat and chills but her symptoms are resolved now. Work up was unremarkable. She has felt well since returning home. She does endorse some myalgias at times. Denies CP/palp/SOB/HA/congestion/fevers/GI or GU c/o. Taking meds as prescribed  Past Medical History:  Diagnosis Date  . Arthritis   . Hyperlipidemia   . Hypothyroid     Past Surgical History:  Procedure Laterality Date  . BUNIONECTOMY Left   . FOOT SURGERY Right    bunion    Family History  Problem Relation Age of Onset  . Cancer Mother   . Heart disease Father   . Arthritis Father        rheumatoid  . Cancer Brother        colon    Social History   Socioeconomic History  . Marital status: Widowed    Spouse name: Not on file  . Number of children: Not on file  . Years of education: Not on file  . Highest education level: Not on file  Occupational History  . Not on file  Tobacco Use  . Smoking status: Never Smoker  . Smokeless tobacco: Never Used  Substance and Sexual Activity  . Alcohol use: Yes    Comment: socially  . Drug use: Not on file  . Sexual activity: Not Currently  Other Topics Concern  . Not on file  Social History Narrative   worked in [employer], short time. No cigarettes or drug use. Live by self no dietary restictions and walks daily   Social Determinants of Health   Financial Resource Strain:   . Difficulty of Paying Living Expenses:   Food Insecurity:   . Worried About Charity fundraiser in the Last Year:   . Arboriculturist in the Last Year:   Transportation Needs:   . Film/video editor (Medical):   Marland Kitchen Lack of Transportation (Non-Medical):   Physical Activity:   . Days of Exercise per Week:   . Minutes of Exercise per  Session:   Stress:   . Feeling of Stress :   Social Connections:   . Frequency of Communication with Friends and Family:   . Frequency of Social Gatherings with Friends and Family:   . Attends Religious Services:   . Active Member of Clubs or Organizations:   . Attends Archivist Meetings:   Marland Kitchen Marital Status:   Intimate Partner Violence:   . Fear of Current or Ex-Partner:   . Emotionally Abused:   Marland Kitchen Physically Abused:   . Sexually Abused:     Outpatient Medications Prior to Visit  Medication Sig Dispense Refill  . levothyroxine (SYNTHROID) 50 MCG tablet TAKE 1 TABLET BY MOUTH EVERY DAY 90 tablet 1  . omeprazole (PRILOSEC OTC) 20 MG tablet Take 20 mg by mouth daily.    Marland Kitchen amoxicillin (AMOXIL) 875 MG tablet Take 1 tablet (875 mg total) by mouth 2 (two) times daily. 14 tablet 0  . Biotin 2500 MCG CAPS Take 5,000 mcg by mouth.     No facility-administered medications prior to visit.    No Known Allergies  Review of Systems  Constitutional: Negative for fever and malaise/fatigue.  HENT: Negative for  congestion.   Eyes: Negative for blurred vision.  Respiratory: Negative for shortness of breath.   Cardiovascular: Negative for chest pain, palpitations and leg swelling.  Gastrointestinal: Negative for abdominal pain, blood in stool and nausea.  Genitourinary: Negative for dysuria and frequency.  Musculoskeletal: Positive for myalgias. Negative for falls.  Skin: Negative for rash.  Neurological: Negative for dizziness, loss of consciousness and headaches.  Endo/Heme/Allergies: Negative for environmental allergies.  Psychiatric/Behavioral: Negative for depression. The patient is not nervous/anxious.        Objective:    Physical Exam Vitals and nursing note reviewed.  Constitutional:      General: She is not in acute distress.    Appearance: She is well-developed.  HENT:     Head: Normocephalic and atraumatic.     Nose: Nose normal.  Eyes:     General:         Right eye: No discharge.        Left eye: No discharge.  Cardiovascular:     Rate and Rhythm: Normal rate and regular rhythm.     Heart sounds: No murmur.  Pulmonary:     Effort: Pulmonary effort is normal.     Breath sounds: Normal breath sounds.  Abdominal:     General: Bowel sounds are normal.     Palpations: Abdomen is soft.     Tenderness: There is no abdominal tenderness.  Musculoskeletal:     Cervical back: Normal range of motion and neck supple.  Skin:    General: Skin is warm and dry.  Neurological:     Mental Status: She is alert and oriented to person, place, and time.     BP 118/70 (BP Location: Right Arm, Cuff Size: Normal)   Pulse (!) 53   Temp (!) 97.4 F (36.3 C) (Temporal)   Resp 12   Ht 4\' 11"  (1.499 m)   Wt 123 lb 6.4 oz (56 kg)   SpO2 96%   BMI 24.92 kg/m  Wt Readings from Last 3 Encounters:  02/28/20 123 lb 6.4 oz (56 kg)  02/15/20 124 lb (56.2 kg)  02/07/20 123 lb (55.8 kg)    Diabetic Foot Exam - Simple   No data filed     Lab Results  Component Value Date   WBC 7.7 02/28/2020   HGB 13.8 02/28/2020   HCT 42.3 02/28/2020   PLT 288.0 02/28/2020   GLUCOSE 93 02/28/2020   CHOL 204 (H) 02/28/2020   TRIG 89.0 02/28/2020   HDL 51.60 02/28/2020   LDLCALC 134 (H) 02/28/2020   ALT 11 02/28/2020   AST 17 02/28/2020   NA 141 02/28/2020   K 5.2 No hemolysis seen (H) 02/28/2020   CL 105 02/28/2020   CREATININE 0.84 02/28/2020   BUN 14 02/28/2020   CO2 29 02/28/2020   TSH 1.48 02/28/2020   HGBA1C 6.1 02/28/2020    Lab Results  Component Value Date   TSH 1.48 02/28/2020   Lab Results  Component Value Date   WBC 7.7 02/28/2020   HGB 13.8 02/28/2020   HCT 42.3 02/28/2020   MCV 91.7 02/28/2020   PLT 288.0 02/28/2020   Lab Results  Component Value Date   NA 141 02/28/2020   K 5.2 No hemolysis seen (H) 02/28/2020   CO2 29 02/28/2020   GLUCOSE 93 02/28/2020   BUN 14 02/28/2020   CREATININE 0.84 02/28/2020   BILITOT 0.5 02/28/2020    ALKPHOS 62 02/28/2020   AST 17 02/28/2020   ALT 11  02/28/2020   PROT 6.7 02/28/2020   ALBUMIN 4.2 02/28/2020   CALCIUM 9.8 02/28/2020   ANIONGAP 6 10/18/2019   GFR 63.97 02/28/2020   Lab Results  Component Value Date   CHOL 204 (H) 02/28/2020   Lab Results  Component Value Date   HDL 51.60 02/28/2020   Lab Results  Component Value Date   LDLCALC 134 (H) 02/28/2020   Lab Results  Component Value Date   TRIG 89.0 02/28/2020   Lab Results  Component Value Date   CHOLHDL 4 02/28/2020   Lab Results  Component Value Date   HGBA1C 6.1 02/28/2020       Assessment & Plan:   Problem List Items Addressed This Visit    Hypothyroid - Primary    On Levothyroxine, continue to monitor      Relevant Orders   TSH (Completed)   Hyperlipidemia    Encouraged heart healthy diet, increase exercise, avoid trans fats, consider a krill oil cap daily      Relevant Orders   Lipid panel (Completed)   Myalgia    She does endorse some muscle cramps at times. Encouraged hydration and check labs      Relevant Orders   CBC (Completed)   Comprehensive metabolic panel (Completed)   Magnesium (Completed)   Hiatal hernia with GERD    Avoid offending foods, start probiotics. Do not eat large meals in late evening and consider raising head of bed. Continue Omeprazole for now      Pharyngitis    She presented to the ER recently with c/o sore throat and chills but her symptoms are resolved now. Work up was unremarkable       Other Visit Diagnoses    Hyperglycemia       Relevant Orders   Hemoglobin A1c (Completed)      I have discontinued Leonora Dormer's Biotin and amoxicillin. I am also having her maintain her levothyroxine and omeprazole.  No orders of the defined types were placed in this encounter.    Penni Homans, MD

## 2020-03-01 NOTE — Assessment & Plan Note (Signed)
She presented to the ER recently with c/o sore throat and chills but her symptoms are resolved now. Work up was unremarkable

## 2020-03-01 NOTE — Assessment & Plan Note (Signed)
She does endorse some muscle cramps at times. Encouraged hydration and check labs

## 2020-03-01 NOTE — Assessment & Plan Note (Signed)
Avoid offending foods, start probiotics. Do not eat large meals in late evening and consider raising head of bed. Continue Omeprazole for now

## 2020-03-03 ENCOUNTER — Other Ambulatory Visit: Payer: Self-pay | Admitting: *Deleted

## 2020-03-03 MED ORDER — LEVOTHYROXINE SODIUM 50 MCG PO TABS: 50.0000 ug | ORAL_TABLET | Freq: Every day | ORAL | 1 refills | Status: DC

## 2020-03-14 ENCOUNTER — Other Ambulatory Visit (INDEPENDENT_AMBULATORY_CARE_PROVIDER_SITE_OTHER): Payer: Medicare Other

## 2020-03-14 ENCOUNTER — Other Ambulatory Visit: Payer: Self-pay

## 2020-03-14 DIAGNOSIS — E875 Hyperkalemia: Secondary | ICD-10-CM

## 2020-03-14 LAB — COMPREHENSIVE METABOLIC PANEL
ALT: 10 U/L (ref 0–35)
AST: 14 U/L (ref 0–37)
Albumin: 4 g/dL (ref 3.5–5.2)
Alkaline Phosphatase: 63 U/L (ref 39–117)
BUN: 14 mg/dL (ref 6–23)
CO2: 31 mEq/L (ref 19–32)
Calcium: 9.4 mg/dL (ref 8.4–10.5)
Chloride: 106 mEq/L (ref 96–112)
Creatinine, Ser: 0.82 mg/dL (ref 0.40–1.20)
GFR: 65.77 mL/min (ref >60.00)
Glucose, Bld: 101 mg/dL — ABNORMAL HIGH (ref 70–99)
Potassium: 5.4 mEq/L — ABNORMAL HIGH (ref 3.5–5.1)
Sodium: 140 mEq/L (ref 135–145)
Total Bilirubin: 0.5 mg/dL (ref 0.2–1.2)
Total Protein: 6.5 g/dL (ref 6.0–8.3)

## 2020-03-18 ENCOUNTER — Telehealth: Payer: Self-pay | Admitting: *Deleted

## 2020-03-18 ENCOUNTER — Other Ambulatory Visit: Payer: Self-pay | Admitting: *Deleted

## 2020-03-18 DIAGNOSIS — E875 Hyperkalemia: Secondary | ICD-10-CM

## 2020-03-18 NOTE — Telephone Encounter (Signed)
-----   Message from Mosie Lukes, MD sent at 03/14/2020  9:40 PM EDT ----- Her potassium Is up some more. Have her continue to try and minimize potassium in the diet and she should start kayexalate 15 gm po tid x 3 days and recheck cmp next week. Warn her she can have diarrhea.

## 2020-03-18 NOTE — Telephone Encounter (Signed)
1. Spoke with patient and she states will having high potassium affect your kidnies?  2.  Kayexalate comes in powder and suspension.  Which would you like?

## 2020-03-18 NOTE — Telephone Encounter (Signed)
Suspension and it is not that the high potassium causes kidney dysfunction it is that as the kidneys get less efficient they cannot filter potassium out of the blood as well and the level increases in the bloodstream

## 2020-03-19 ENCOUNTER — Telehealth: Payer: Self-pay | Admitting: Family Medicine

## 2020-03-19 MED ORDER — SODIUM POLYSTYRENE SULFONATE 15 GM/60ML PO SUSP: 15.0000 g | Freq: Three times a day (TID) | ORAL | 0 refills | Status: DC

## 2020-03-19 MED ORDER — SODIUM POLYSTYRENE SULFONATE 15 GM/60ML PO SUSP: 15.0000 g | Freq: Three times a day (TID) | ORAL | 0 refills | Status: AC

## 2020-03-19 NOTE — Telephone Encounter (Signed)
No answer no vm

## 2020-03-19 NOTE — Telephone Encounter (Signed)
Patient notified.  She states that she has a little diarrhea but is willing to try the medication.  Medication sent in this morning.

## 2020-03-19 NOTE — Progress Notes (Addendum)
[place] [facility] [address], [place], [place] [address] [id number] [phone]  Date:  03/20/2020   Name:  Cynthia Young   DOB:  1929/12/14   MRN:  [id number]  PCP:  Mosie Lukes, MD    Chief Complaint: Abdominal Pain (lower abdominal cramping, 6-7 days)   History of Present Illness:  Cynthia Young is a 86 y.o. very pleasant female patient who presents with the following:  Elderly lady who is a primary pt of Dr Charlett Blake- here today with concern of abdominal pain I have not seen her myself previously  History of hypothyroidism, GERD and hiatal hernia  Some recent lab work-up on chart, mild hyperkalemia present  Today she notes that about one week ago she awoke with lower back pain She thought this was due to lifting some heavy potting soil into the trunk of her car recently She then developed abdominal cramping and diarrhea She actually had 2 stool accidents as her bowels were so urgent  No BM so far today Yesterday no diarrhea Her abd cramping is now "very slight"- improved  No nausea or vomiting Not aware of any blood in her stool No fever noted  She is not aware of any suspicious foods, no sick contacts with same  No urinary symptoms except for some frequency   Dr Charlett Blake gave her some kayexalate for hyperkalemia- she is just picking this up today  She did take some amoxicillin in March of this year   Patient Active Problem List   Diagnosis Date Noted  . Pharyngitis 02/10/2020  . Varicose veins of both lower extremities 08/03/2019  . Hiatal hernia with GERD 03/16/2019  . Myalgia 09/25/2018  . Preventative health care 03/30/2018  . Hypothyroid   . Hyperlipidemia     Past Medical History:  Diagnosis Date  . Arthritis   . Hyperlipidemia   . Hypothyroid     Past Surgical History:  Procedure Laterality Date  . BUNIONECTOMY Left   . FOOT SURGERY Right    bunion    Social History   Tobacco Use  . Smoking  status: Never Smoker  . Smokeless tobacco: Never Used  Substance Use Topics  . Alcohol use: Yes    Comment: socially  . Drug use: Not on file    Family History  Problem Relation Age of Onset  . Cancer Mother   . Heart disease Father   . Arthritis Father        rheumatoid  . Cancer Brother        colon    No Known Allergies  Medication list has been reviewed and updated.  Current Outpatient Medications on File Prior to Visit  Medication Sig Dispense Refill  . levothyroxine (SYNTHROID) 50 MCG tablet Take 1 tablet (50 mcg total) by mouth daily. 90 tablet 1  . omeprazole (PRILOSEC OTC) 20 MG tablet Take 20 mg by mouth daily.     No current facility-administered medications on file prior to visit.    Review of Systems:  As per HPI- otherwise negative.   Physical Examination: Vitals:   03/20/20 1016  BP: 130/74  Pulse: (!) 54  Resp: 16  Temp: (!) 97.4 F (36.3 C)  SpO2: 99%   Vitals:   03/20/20 1016  Weight: 118 lb (53.5 kg)  Height: 4\' 11"  (1.499 m)   Body mass index is 23.83 kg/m. Ideal Body Weight: Weight in (lb) to have BMI = 25: 123.5  GEN:  no acute distress.  Petite build, normal weight.  Looks well HEENT: Atraumatic, Normocephalic.  Ears and Nose: No external deformity. CV: RRR, No M/G/R. No JVD. No thrill. No extra heart sounds. PULM: CTA B, no wheezes, crackles, rhonchi. No retractions. No resp. distress. No accessory muscle use.  Belly is benign today No lower back tenderness is apparent ABD: S, NT, ND, +BS. No rebound. No HSM. EXTR: No c/c/e PSYCH: Normally interactive. Conversant.     Assessment and Plan: Diarrhea, unspecified type - Plan: Basic metabolic panel, CBC  Hyperkalemia - Plan: Basic metabolic panel  Urinary frequency - Plan: Urine Culture, POCT urinalysis dipstick  Patient here today for concern of recent diarrhea, abdominal cramping, hyperkalemia.  Fortunately her diarrhea and abdominal cramping are now mostly resolved.  We  will check a BMP and CBC today to evaluate potassium level UA is benign, urine culture pending  I will be in touch with her pending her labs, have asked her to contact me if any worsening or concerns in the meantime  This visit occurred during the SARS-CoV-2 public health emergency.  Safety protocols were in place, including screening questions prior to the visit, additional usage of staff PPE, and extensive cleaning of exam room while observing appropriate contact time as indicated for disinfecting solutions.   Moderate medical decision making today  Signed Lamar Blinks, MD  Received her labs, called pt to discuss her K- it is stable, mildly elevated. She has been rx kayexalate by her PCP.  Discussed use, explained that 15 mg = I tablespoon I suggested that she try a 1/2 dose first to check tolerance as she recently had diarrhea  Addendum 5/9, received urine culture-this was expected result   Results for orders placed or performed in visit on 03/20/20  Urine Culture   Specimen: Blood  Result Value Ref Range   MICRO NUMBER: [id number]    SPECIMEN QUALITY: Adequate    Sample Source NOT GIVEN    STATUS: FINAL    Result: No Growth   Basic metabolic panel  Result Value Ref Range   Sodium 137 135 - 145 mEq/L   Potassium 5.4 No hemolysis seen (H) 3.5 - 5.1 mEq/L   Chloride 103 96 - 112 mEq/L   CO2 30 19 - 32 mEq/L   Glucose, Bld 100 (H) 70 - 99 mg/dL   BUN 13 6 - 23 mg/dL   Creatinine, Ser 0.86 0.40 - 1.20 mg/dL   GFR 62.25 >60.00 mL/min   Calcium 9.8 8.4 - 10.5 mg/dL  CBC  Result Value Ref Range   WBC 7.4 4.0 - 10.5 K/uL   RBC 4.67 3.87 - 5.11 Mil/uL   Platelets 239.0 150.0 - 400.0 K/uL   Hemoglobin 14.3 12.0 - 15.0 g/dL   HCT 43.0 36.0 - 46.0 %   MCV 92.1 78.0 - 100.0 fl   MCHC 33.2 30.0 - 36.0 g/dL   RDW 13.6 11.5 - 15.5 %  POCT urinalysis dipstick  Result Value Ref Range   Color, UA yellow yellow   Clarity, UA clear clear   Glucose, UA negative negative mg/dL    Bilirubin, UA negative negative   Ketones, POC UA negative negative mg/dL   Spec Grav, UA 1.020 1.010 - 1.025   Blood, UA negative negative   pH, UA 5.0 5.0 - 8.0   Protein Ur, POC negative negative mg/dL   Urobilinogen, UA 0.2 0.2 or 1.0 E.U./dL   Nitrite, UA Negative Negative   Leukocytes, UA Negative Negative

## 2020-03-19 NOTE — Telephone Encounter (Signed)
Caller [facility]  Call Back # [phone]    sodium polystyrene (KAYEXALATE) 15 GM/60ML suspension  Please Advise  Per pharmacy, Prescription was written to take 62ml three times daily. Equaling 553ml total. Prescription was only written for 270ml.  Pharmacy is requesting clarification.

## 2020-03-19 NOTE — Telephone Encounter (Signed)
rx for 544ml sent in.

## 2020-03-19 NOTE — Telephone Encounter (Signed)
Patient called back about the medication for potassium.  She was asking how much she was suppose to take advised her how much she suppose to take.    She then goes to say that she has abdominal pain and has been having diarrhea.  I had advised earlier that medication could cause diarrhea but she wanted to try medication anyway.  I advised patient to hold off medication until seen tomorrow by Dr. Lorelei Pont.  She had so many questions about her abdomen.  She asked if she should do the life line, which is scheduled for the 20th to see if that would find out what is going on with her abdomen.  I advised her that's a screening tests and if she is having something acute, she should be seen by a provider.

## 2020-03-19 NOTE — Patient Instructions (Addendum)
It was nice to see you today!  I will be in touch with your labs.  If you like, you can wait to take the liquid medication you got today until your potassium level is back (this is a medication to lower potassium)

## 2020-03-20 ENCOUNTER — Other Ambulatory Visit: Payer: Self-pay

## 2020-03-20 ENCOUNTER — Encounter: Payer: Self-pay | Admitting: Family Medicine

## 2020-03-20 ENCOUNTER — Ambulatory Visit (INDEPENDENT_AMBULATORY_CARE_PROVIDER_SITE_OTHER): Payer: Medicare Other | Admitting: Family Medicine

## 2020-03-20 VITALS — BP 130/74 | HR 54 | Temp 97.4°F | Resp 16 | Ht 59.0 in | Wt 118.0 lb

## 2020-03-20 DIAGNOSIS — E875 Hyperkalemia: Secondary | ICD-10-CM | POA: Diagnosis not present

## 2020-03-20 DIAGNOSIS — R350 Frequency of micturition: Secondary | ICD-10-CM | POA: Diagnosis not present

## 2020-03-20 DIAGNOSIS — R197 Diarrhea, unspecified: Secondary | ICD-10-CM

## 2020-03-20 LAB — BASIC METABOLIC PANEL
BUN: 13 mg/dL (ref 6–23)
CO2: 30 mEq/L (ref 19–32)
Calcium: 9.8 mg/dL (ref 8.4–10.5)
Chloride: 103 mEq/L (ref 96–112)
Creatinine, Ser: 0.86 mg/dL (ref 0.40–1.20)
GFR: 62.25 mL/min (ref >60.00)
Glucose, Bld: 100 mg/dL — ABNORMAL HIGH (ref 70–99)
Potassium: 5.4 mEq/L — ABNORMAL HIGH (ref 3.5–5.1)
Sodium: 137 mEq/L (ref 135–145)

## 2020-03-20 LAB — POCT URINALYSIS DIP (MANUAL ENTRY)
Bilirubin, UA: NEGATIVE
Blood, UA: NEGATIVE
Glucose, UA: NEGATIVE mg/dL
Ketones, POC UA: NEGATIVE mg/dL
Leukocytes, UA: NEGATIVE
Nitrite, UA: NEGATIVE
Protein Ur, POC: NEGATIVE mg/dL
Spec Grav, UA: 1.02 (ref 1.010–1.025)
Urobilinogen, UA: 0.2 U/dL
pH, UA: 5 (ref 5.0–8.0)

## 2020-03-20 LAB — CBC
HCT: 43 % (ref 36.0–46.0)
Hemoglobin: 14.3 g/dL (ref 12.0–15.0)
MCHC: 33.2 g/dL (ref 30.0–36.0)
MCV: 92.1 fl (ref 78.0–100.0)
Platelets: 239 10*3/uL (ref 150.0–400.0)
RBC: 4.67 Mil/uL (ref 3.87–5.11)
RDW: 13.6 % (ref 11.5–15.5)
WBC: 7.4 10*3/uL (ref 4.0–10.5)

## 2020-03-21 ENCOUNTER — Telehealth: Payer: Self-pay | Admitting: *Deleted

## 2020-03-21 LAB — URINE CULTURE
MICRO NUMBER:: 10447263
Result:: NO GROWTH
SPECIMEN QUALITY:: ADEQUATE

## 2020-03-21 NOTE — Telephone Encounter (Signed)
Patient stated that the medication kayexalate is giving her diarrhea.  Copland has her on it twice a day.  She is willing to continue.  I have her set up to recheck lab on Monday.

## 2020-03-21 NOTE — Telephone Encounter (Signed)
-----   Message from Mosie Lukes, MD sent at 03/20/2020 11:14 PM EDT ----- Thanks for you help with this and I check in with her and have her repeat a cmp on Monday

## 2020-03-23 NOTE — Telephone Encounter (Signed)
So check labs on Monday and we can adjust doses. Our goal is normal potassium and hopefully just a couple loose stool daily.

## 2020-03-24 ENCOUNTER — Other Ambulatory Visit: Payer: Medicare Other

## 2020-03-24 NOTE — Telephone Encounter (Signed)
Thanks for update

## 2020-03-24 NOTE — Telephone Encounter (Signed)
Just FYI  [9:17 AM] Cynthia Young     Patient will not be coming today for lab  [9:17 AM] Cynthia Young     patient states she will come Wednesday instead

## 2020-03-26 ENCOUNTER — Other Ambulatory Visit: Payer: Self-pay

## 2020-03-26 ENCOUNTER — Other Ambulatory Visit (INDEPENDENT_AMBULATORY_CARE_PROVIDER_SITE_OTHER): Payer: Medicare Other

## 2020-03-26 DIAGNOSIS — E875 Hyperkalemia: Secondary | ICD-10-CM

## 2020-03-26 LAB — COMPREHENSIVE METABOLIC PANEL
ALT: 9 U/L (ref 0–35)
AST: 14 U/L (ref 0–37)
Albumin: 4 g/dL (ref 3.5–5.2)
Alkaline Phosphatase: 58 U/L (ref 39–117)
BUN: 14 mg/dL (ref 6–23)
CO2: 32 mEq/L (ref 19–32)
Calcium: 9.6 mg/dL (ref 8.4–10.5)
Chloride: 104 mEq/L (ref 96–112)
Creatinine, Ser: 0.78 mg/dL (ref 0.40–1.20)
GFR: 69.67 mL/min (ref >60.00)
Glucose, Bld: 103 mg/dL — ABNORMAL HIGH (ref 70–99)
Potassium: 4.8 mEq/L (ref 3.5–5.1)
Sodium: 142 mEq/L (ref 135–145)
Total Bilirubin: 0.5 mg/dL (ref 0.2–1.2)
Total Protein: 6.5 g/dL (ref 6.0–8.3)

## 2020-04-08 ENCOUNTER — Other Ambulatory Visit: Payer: Self-pay | Admitting: Family Medicine

## 2020-04-08 ENCOUNTER — Telehealth: Payer: Self-pay

## 2020-04-08 DIAGNOSIS — R194 Change in bowel habit: Secondary | ICD-10-CM

## 2020-04-08 DIAGNOSIS — K219 Gastro-esophageal reflux disease without esophagitis: Secondary | ICD-10-CM

## 2020-04-08 NOTE — Telephone Encounter (Signed)
Can we put in referral for GI for this patient.    I tried calling for more info but number was busy

## 2020-04-08 NOTE — Telephone Encounter (Signed)
I placed referral.

## 2020-04-08 NOTE — Telephone Encounter (Signed)
Patient called in to see if Dr. Charlett Blake could send in a referral for a Gastrologist. Patient states that she has been having very bad stomach issues for the last few months. Please call the patient to advise at  [phone]

## 2020-04-08 NOTE — Telephone Encounter (Signed)
Patient notified. She still would like to keep appt tomorrow with Copland to evaluate her.

## 2020-04-08 NOTE — Telephone Encounter (Signed)
Caller: Donelle Call back phone number: [phone]  Patient is calling back to check the status of the referral.

## 2020-04-09 ENCOUNTER — Emergency Department (HOSPITAL_BASED_OUTPATIENT_CLINIC_OR_DEPARTMENT_OTHER): Payer: Medicare Other

## 2020-04-09 ENCOUNTER — Encounter (HOSPITAL_BASED_OUTPATIENT_CLINIC_OR_DEPARTMENT_OTHER): Payer: Self-pay | Admitting: *Deleted

## 2020-04-09 ENCOUNTER — Inpatient Hospital Stay (HOSPITAL_BASED_OUTPATIENT_CLINIC_OR_DEPARTMENT_OTHER)
Admission: EM | Admit: 2020-04-09 | Discharge: 2020-04-13 | DRG: 419 | Disposition: A | Discharge status: Home Health | Payer: Medicare Other | Attending: Family Medicine | Admitting: Family Medicine

## 2020-04-09 ENCOUNTER — Ambulatory Visit: Payer: Medicare Other | Admitting: Family Medicine

## 2020-04-09 ENCOUNTER — Other Ambulatory Visit: Payer: Self-pay

## 2020-04-09 DIAGNOSIS — K819 Cholecystitis, unspecified: Secondary | ICD-10-CM

## 2020-04-09 DIAGNOSIS — R030 Elevated blood-pressure reading, without diagnosis of hypertension: Secondary | ICD-10-CM | POA: Diagnosis present

## 2020-04-09 DIAGNOSIS — R933 Abnormal findings on diagnostic imaging of other parts of digestive tract: Secondary | ICD-10-CM | POA: Diagnosis not present

## 2020-04-09 DIAGNOSIS — I441 Atrioventricular block, second degree: Secondary | ICD-10-CM | POA: Diagnosis present

## 2020-04-09 DIAGNOSIS — R932 Abnormal findings on diagnostic imaging of liver and biliary tract: Secondary | ICD-10-CM | POA: Diagnosis not present

## 2020-04-09 DIAGNOSIS — K219 Gastro-esophageal reflux disease without esophagitis: Secondary | ICD-10-CM | POA: Diagnosis present

## 2020-04-09 DIAGNOSIS — Z20822 Contact with and (suspected) exposure to covid-19: Secondary | ICD-10-CM | POA: Diagnosis not present

## 2020-04-09 DIAGNOSIS — Z8261 Family history of arthritis: Secondary | ICD-10-CM | POA: Diagnosis not present

## 2020-04-09 DIAGNOSIS — K3189 Other diseases of stomach and duodenum: Secondary | ICD-10-CM | POA: Diagnosis present

## 2020-04-09 DIAGNOSIS — E039 Hypothyroidism, unspecified: Secondary | ICD-10-CM | POA: Diagnosis not present

## 2020-04-09 DIAGNOSIS — Z8249 Family history of ischemic heart disease and other diseases of the circulatory system: Secondary | ICD-10-CM

## 2020-04-09 DIAGNOSIS — Z800 Family history of malignant neoplasm of digestive organs: Secondary | ICD-10-CM

## 2020-04-09 DIAGNOSIS — R001 Bradycardia, unspecified: Secondary | ICD-10-CM | POA: Diagnosis present

## 2020-04-09 DIAGNOSIS — E785 Hyperlipidemia, unspecified: Secondary | ICD-10-CM | POA: Diagnosis present

## 2020-04-09 DIAGNOSIS — K259 Gastric ulcer, unspecified as acute or chronic, without hemorrhage or perforation: Secondary | ICD-10-CM | POA: Diagnosis present

## 2020-04-09 DIAGNOSIS — K810 Acute cholecystitis: Secondary | ICD-10-CM | POA: Diagnosis present

## 2020-04-09 DIAGNOSIS — R109 Unspecified abdominal pain: Secondary | ICD-10-CM | POA: Diagnosis not present

## 2020-04-09 DIAGNOSIS — K2970 Gastritis, unspecified, without bleeding: Secondary | ICD-10-CM | POA: Diagnosis not present

## 2020-04-09 DIAGNOSIS — R739 Hyperglycemia, unspecified: Secondary | ICD-10-CM | POA: Diagnosis present

## 2020-04-09 DIAGNOSIS — K811 Chronic cholecystitis: Secondary | ICD-10-CM | POA: Diagnosis not present

## 2020-04-09 DIAGNOSIS — K660 Peritoneal adhesions (postprocedural) (postinfection): Secondary | ICD-10-CM | POA: Diagnosis not present

## 2020-04-09 DIAGNOSIS — R1010 Upper abdominal pain, unspecified: Secondary | ICD-10-CM | POA: Diagnosis not present

## 2020-04-09 DIAGNOSIS — K449 Diaphragmatic hernia without obstruction or gangrene: Secondary | ICD-10-CM | POA: Diagnosis present

## 2020-04-09 DIAGNOSIS — R1030 Lower abdominal pain, unspecified: Secondary | ICD-10-CM | POA: Diagnosis not present

## 2020-04-09 DIAGNOSIS — K8066 Calculus of gallbladder and bile duct with acute and chronic cholecystitis without obstruction: Secondary | ICD-10-CM | POA: Diagnosis not present

## 2020-04-09 DIAGNOSIS — K2980 Duodenitis without bleeding: Secondary | ICD-10-CM | POA: Diagnosis present

## 2020-04-09 DIAGNOSIS — K760 Fatty (change of) liver, not elsewhere classified: Secondary | ICD-10-CM | POA: Diagnosis not present

## 2020-04-09 DIAGNOSIS — K8043 Calculus of bile duct with acute cholecystitis with obstruction: Secondary | ICD-10-CM | POA: Diagnosis not present

## 2020-04-09 DIAGNOSIS — R945 Abnormal results of liver function studies: Secondary | ICD-10-CM | POA: Diagnosis not present

## 2020-04-09 DIAGNOSIS — K8000 Calculus of gallbladder with acute cholecystitis without obstruction: Secondary | ICD-10-CM

## 2020-04-09 DIAGNOSIS — K8050 Calculus of bile duct without cholangitis or cholecystitis without obstruction: Secondary | ICD-10-CM | POA: Diagnosis not present

## 2020-04-09 DIAGNOSIS — K8020 Calculus of gallbladder without cholecystitis without obstruction: Secondary | ICD-10-CM | POA: Diagnosis not present

## 2020-04-09 DIAGNOSIS — R079 Chest pain, unspecified: Secondary | ICD-10-CM | POA: Diagnosis not present

## 2020-04-09 DIAGNOSIS — D72829 Elevated white blood cell count, unspecified: Secondary | ICD-10-CM | POA: Diagnosis not present

## 2020-04-09 LAB — COMPREHENSIVE METABOLIC PANEL
ALT: 13 U/L (ref 0–44)
AST: 18 U/L (ref 15–41)
Albumin: 3.9 g/dL (ref 3.5–5.0)
Alkaline Phosphatase: 59 U/L (ref 38–126)
Anion gap: 12 (ref 5–15)
BUN: 13 mg/dL (ref 8–23)
CO2: 24 mmol/L (ref 22–32)
Calcium: 9.2 mg/dL (ref 8.9–10.3)
Chloride: 102 mmol/L (ref 98–111)
Creatinine, Ser: 0.73 mg/dL (ref 0.44–1.00)
GFR calc Af Amer: >60 mL/min (ref >60)
GFR calc non Af Amer: >60 mL/min (ref >60)
Glucose, Bld: 159 mg/dL — ABNORMAL HIGH (ref 70–99)
Potassium: 4.2 mmol/L (ref 3.5–5.1)
Sodium: 138 mmol/L (ref 135–145)
Total Bilirubin: 0.7 mg/dL (ref 0.3–1.2)
Total Protein: 7.1 g/dL (ref 6.5–8.1)

## 2020-04-09 LAB — CBC WITH DIFFERENTIAL/PLATELET
Abs Immature Granulocytes: 0.07 10*3/uL (ref 0.00–0.07)
Basophils Absolute: 0 10*3/uL (ref 0.0–0.1)
Basophils Relative: 0 %
Eosinophils Absolute: 0 10*3/uL (ref 0.0–0.5)
Eosinophils Relative: 0 %
HCT: 42.6 % (ref 36.0–46.0)
Hemoglobin: 14 g/dL (ref 12.0–15.0)
Immature Granulocytes: 1 %
Lymphocytes Relative: 6 %
Lymphs Abs: 0.7 10*3/uL (ref 0.7–4.0)
MCH: 30.5 pg (ref 26.0–34.0)
MCHC: 32.9 g/dL (ref 30.0–36.0)
MCV: 92.8 fL (ref 80.0–100.0)
Monocytes Absolute: 0.5 10*3/uL (ref 0.1–1.0)
Monocytes Relative: 4 %
Neutro Abs: 11.4 10*3/uL — ABNORMAL HIGH (ref 1.7–7.7)
Neutrophils Relative %: 89 %
Platelets: 250 10*3/uL (ref 150–400)
RBC: 4.59 MIL/uL (ref 3.87–5.11)
RDW: 13.2 % (ref 11.5–15.5)
WBC: 12.7 10*3/uL — ABNORMAL HIGH (ref 4.0–10.5)
nRBC: 0 % (ref 0.0–0.2)

## 2020-04-09 LAB — SARS CORONAVIRUS 2 BY RT PCR (HOSPITAL ORDER, PERFORMED IN ~~LOC~~ HOSPITAL LAB): SARS Coronavirus 2: NEGATIVE

## 2020-04-09 LAB — TROPONIN I (HIGH SENSITIVITY): Troponin I (High Sensitivity): 8 ng/L (ref <18)

## 2020-04-09 LAB — LIPASE, BLOOD: Lipase: 19 U/L (ref 11–51)

## 2020-04-09 LAB — LACTIC ACID, PLASMA: Lactic Acid, Venous: 1.2 mmol/L (ref 0.5–1.9)

## 2020-04-09 MED ORDER — MORPHINE SULFATE (PF) 2 MG/ML IV SOLN
2.0000 mg | INTRAVENOUS | Status: DC | PRN
  Administered 2020-04-09 – 2020-04-10 (×2): 2 mg via INTRAVENOUS
  Filled 2020-04-09 (×2): qty 1

## 2020-04-09 MED ORDER — ACETAMINOPHEN 325 MG PO TABS
650.0000 mg | ORAL_TABLET | Freq: Four times a day (QID) | ORAL | Status: DC | PRN
  Filled 2020-04-09: qty 2

## 2020-04-09 MED ORDER — HYDROMORPHONE HCL 1 MG/ML IJ SOLN
0.5000 mg | Freq: Once | INTRAMUSCULAR | Status: AC
  Administered 2020-04-09: 0.5 mg via INTRAVENOUS
  Filled 2020-04-09: qty 1

## 2020-04-09 MED ORDER — ONDANSETRON HCL 4 MG/2ML IJ SOLN
4.0000 mg | Freq: Four times a day (QID) | INTRAMUSCULAR | Status: DC | PRN
  Administered 2020-04-09: 4 mg via INTRAVENOUS
  Filled 2020-04-09: qty 2

## 2020-04-09 MED ORDER — METHOCARBAMOL 500 MG PO TABS
500.0000 mg | ORAL_TABLET | Freq: Three times a day (TID) | ORAL | Status: DC | PRN
  Filled 2020-04-09: qty 1

## 2020-04-09 MED ORDER — ENOXAPARIN SODIUM 40 MG/0.4ML ~~LOC~~ SOLN
40.0000 mg | SUBCUTANEOUS | Status: DC
  Administered 2020-04-09 – 2020-04-10 (×2): 40 mg via SUBCUTANEOUS
  Filled 2020-04-09 (×2): qty 0.4

## 2020-04-09 MED ORDER — SODIUM CHLORIDE 0.9 % IV SOLN
2.0000 g | INTRAVENOUS | Status: DC
  Administered 2020-04-10 – 2020-04-11 (×2): 2 g via INTRAVENOUS
  Filled 2020-04-09: qty 20
  Filled 2020-04-09 (×2): qty 2
  Filled 2020-04-09: qty 20

## 2020-04-09 MED ORDER — IOHEXOL 300 MG/ML  SOLN
100.0000 mL | Freq: Once | INTRAMUSCULAR | Status: AC | PRN
  Administered 2020-04-09: 100 mL via INTRAVENOUS

## 2020-04-09 MED ORDER — SENNA 8.6 MG PO TABS
1.0000 | ORAL_TABLET | Freq: Two times a day (BID) | ORAL | Status: DC
  Administered 2020-04-09 – 2020-04-13 (×4): 8.6 mg via ORAL
  Filled 2020-04-09 (×6): qty 1

## 2020-04-09 MED ORDER — SODIUM CHLORIDE 0.9 % IV SOLN
2.0000 g | Freq: Once | INTRAVENOUS | Status: AC
  Administered 2020-04-09: 2 g via INTRAVENOUS
  Filled 2020-04-09: qty 20

## 2020-04-09 MED ORDER — PANTOPRAZOLE SODIUM 40 MG IV SOLR
40.0000 mg | Freq: Two times a day (BID) | INTRAVENOUS | Status: DC
  Administered 2020-04-09 – 2020-04-10 (×2): 40 mg via INTRAVENOUS
  Filled 2020-04-09 (×2): qty 40

## 2020-04-09 MED ORDER — SODIUM CHLORIDE 0.9 % IV SOLN: INTRAVENOUS | Status: DC

## 2020-04-09 MED ORDER — METRONIDAZOLE IN NACL 5-0.79 MG/ML-% IV SOLN
500.0000 mg | Freq: Three times a day (TID) | INTRAVENOUS | Status: DC
  Administered 2020-04-09 – 2020-04-13 (×10): 500 mg via INTRAVENOUS
  Filled 2020-04-09 (×8): qty 100

## 2020-04-09 MED ORDER — ONDANSETRON HCL 4 MG PO TABS: 4.0000 mg | ORAL_TABLET | Freq: Four times a day (QID) | ORAL | Status: DC | PRN

## 2020-04-09 MED ORDER — FENTANYL CITRATE (PF) 100 MCG/2ML IJ SOLN
50.0000 ug | Freq: Once | INTRAMUSCULAR | Status: AC
  Administered 2020-04-09: 50 ug via INTRAVENOUS
  Filled 2020-04-09: qty 2

## 2020-04-09 MED ORDER — MAGNESIUM HYDROXIDE 400 MG/5ML PO SUSP: 30.0000 mL | Freq: Every day | ORAL | Status: DC | PRN

## 2020-04-09 MED ORDER — SORBITOL 70 % SOLN
30.0000 mL | Freq: Every day | Status: DC | PRN
  Filled 2020-04-09: qty 30

## 2020-04-09 MED ORDER — METRONIDAZOLE IN NACL 5-0.79 MG/ML-% IV SOLN
500.0000 mg | Freq: Once | INTRAVENOUS | Status: AC
  Administered 2020-04-09: 500 mg via INTRAVENOUS
  Filled 2020-04-09: qty 100

## 2020-04-09 MED ORDER — SODIUM CHLORIDE 0.9% FLUSH
3.0000 mL | Freq: Two times a day (BID) | INTRAVENOUS | Status: DC
  Administered 2020-04-09 – 2020-04-13 (×4): 3 mL via INTRAVENOUS

## 2020-04-09 MED ORDER — SODIUM CHLORIDE 0.9 % IV SOLN
INTRAVENOUS | Status: DC | PRN
  Administered 2020-04-09: 500 mL via INTRAVENOUS

## 2020-04-09 MED ORDER — FAMOTIDINE IN NACL 20-0.9 MG/50ML-% IV SOLN
20.0000 mg | Freq: Once | INTRAVENOUS | Status: AC
  Administered 2020-04-09: 20 mg via INTRAVENOUS
  Filled 2020-04-09: qty 50

## 2020-04-09 MED ORDER — SODIUM CHLORIDE 0.9 % IV BOLUS
500.0000 mL | Freq: Once | INTRAVENOUS | Status: AC
  Administered 2020-04-09: 500 mL via INTRAVENOUS

## 2020-04-09 MED ORDER — HYDROCODONE-ACETAMINOPHEN 5-325 MG PO TABS
1.0000 | ORAL_TABLET | ORAL | Status: DC | PRN
  Administered 2020-04-09 – 2020-04-10 (×2): 2 via ORAL
  Administered 2020-04-11: 1 via ORAL
  Administered 2020-04-11: 2 via ORAL
  Administered 2020-04-12 (×2): 1 via ORAL
  Filled 2020-04-09 (×2): qty 2
  Filled 2020-04-09: qty 1
  Filled 2020-04-09 (×2): qty 2
  Filled 2020-04-09: qty 1

## 2020-04-09 MED ORDER — ACETAMINOPHEN 500 MG PO TABS: 500.0000 mg | ORAL_TABLET | Freq: Once | ORAL | Status: DC

## 2020-04-09 MED ORDER — LEVOTHYROXINE SODIUM 100 MCG/5ML IV SOLN
25.0000 ug | Freq: Every day | INTRAVENOUS | Status: DC
  Administered 2020-04-10 – 2020-04-13 (×3): 25 ug via INTRAVENOUS
  Filled 2020-04-09 (×5): qty 5

## 2020-04-09 NOTE — Progress Notes (Signed)
Attempted to give report for the third time, patient now going to [phone], still unable to give report.

## 2020-04-09 NOTE — Consult Note (Addendum)
[facility] Surgery ConsultNote  Cynthia Young Feb 16, 1930  [id number].    Requesting MD: Dr. Terrilee Croak Chief Complaint/Reason for Consult: abdominal pain   HPI:   Patient is an 86 year old female who presented to [facility] for abdominal pain. Pain started 3 weeks ago and initially more intermittent but since has become more constant and more intense. Pain started more in lower abdomen but now is felt in her upper abdomen and chest as well. She reports this all started after eating some spaghetti and meatballs that she prepared for her grandson a few weeks ago. Pain is worse when she eats. She has had some associated nausea and spit up some white foamy material. She was constipated but had a BM this AM that was medium and formed. Denies fever, chills, sob, urinary symptoms, diarrhea, sick contacts. She is otherwise pretty healthy, has some thyroid disease, HLD and arthritis. NKDA. No past abdominal surgery.   ROS: Review of Systems  Constitutional: Negative for chills and fever.  Respiratory: Negative for shortness of breath.   Cardiovascular: Negative for chest pain and palpitations.  Gastrointestinal: Positive for abdominal pain, constipation and nausea. Negative for blood in stool, diarrhea, melena and vomiting.  Genitourinary: Negative for dysuria, frequency and urgency.  All other systems reviewed and are negative.   Family History  Problem Relation Age of Onset  . Cancer Mother   . Heart disease Father   . Arthritis Father        rheumatoid  . Cancer Brother        colon    Past Medical History:  Diagnosis Date  . Arthritis   . Hyperlipidemia   . Hypothyroid     Past Surgical History:  Procedure Laterality Date  . BUNIONECTOMY Left   . FOOT SURGERY Right    bunion    Social History:  reports that she has never smoked. She has never used smokeless tobacco. She reports current alcohol use. She reports that she does not use drugs.  Allergies: No Known  Allergies  Medications Prior to Admission  Medication Sig Dispense Refill  . levothyroxine (SYNTHROID) 50 MCG tablet Take 1 tablet (50 mcg total) by mouth daily. 90 tablet 1  . omeprazole (PRILOSEC OTC) 20 MG tablet Take 20 mg by mouth daily.      Blood pressure (!) 167/66, pulse (!) 54, temperature 98 F (36.7 C), resp. rate 14, height 4\' 11"  (1.499 m), weight 54.4 kg, SpO2 99 %. Physical Exam:  General: pleasant, WD, WN white female who is laying in bed in NAD HEENT: Sclera are anicteric.  PERRL.  Ears and nose without any masses or lesions.  Mouth is pink and moist Heart: regular, rate, and rhythm.  Normal s1,s2. No obvious murmurs, gallops, or rubs noted.  Palpable radial and pedal pulses bilaterally Lungs: CTAB, no wheezes, rhonchi, or rales noted.  Respiratory effort nonlabored Abd: soft, mildly ttp across lower abdomen, mildly ttp to deep palpation of RUQ but negative murphy sign, ND, +BS, no masses, hernias, or organomegaly MS: all 4 extremities are symmetrical with no cyanosis, clubbing, or edema. Skin: warm and dry with no masses, lesions, or rashes Neuro: Cranial nerves 2-12 grossly intact, sensation grossly intact throughout Psych: A&Ox3 with an appropriate affect.   Results for orders placed or performed during the hospital encounter of 04/09/20 (from the past 48 hour(s))  Comprehensive metabolic panel     Status: Abnormal   Collection Time: 04/09/20  9:27 AM  Result Value Ref Range  Sodium 138 135 - 145 mmol/L   Potassium 4.2 3.5 - 5.1 mmol/L   Chloride 102 98 - 111 mmol/L   CO2 24 22 - 32 mmol/L   Glucose, Bld 159 (H) 70 - 99 mg/dL    Comment: Glucose reference range applies only to samples taken after fasting for at least 8 hours.   BUN 13 8 - 23 mg/dL   Creatinine, Ser 0.73 0.44 - 1.00 mg/dL   Calcium 9.2 8.9 - 10.3 mg/dL   Total Protein 7.1 6.5 - 8.1 g/dL   Albumin 3.9 3.5 - 5.0 g/dL   AST 18 15 - 41 U/L   ALT 13 0 - 44 U/L   Alkaline Phosphatase 59 38 - 126  U/L   Total Bilirubin 0.7 0.3 - 1.2 mg/dL   GFR calc non Af Amer >60 >60 mL/min   GFR calc Af Amer >60 >60 mL/min   Anion gap 12 5 - 15    Comment: Performed at [facility], [address] [place], [place] [address]  Lipase, blood     Status: None   Collection Time: 04/09/20  9:27 AM  Result Value Ref Range   Lipase 19 11 - 51 U/L    Comment: Performed at [facility], [address] [place], [place] [address]  CBC with Differential     Status: Abnormal   Collection Time: 04/09/20  9:27 AM  Result Value Ref Range   WBC 12.7 (H) 4.0 - 10.5 K/uL   RBC 4.59 3.87 - 5.11 MIL/uL   Hemoglobin 14.0 12.0 - 15.0 g/dL   HCT 42.6 36.0 - 46.0 %   MCV 92.8 80.0 - 100.0 fL   MCH 30.5 26.0 - 34.0 pg   MCHC 32.9 30.0 - 36.0 g/dL   RDW 13.2 11.5 - 15.5 %   Platelets 250 150 - 400 K/uL   nRBC 0.0 0.0 - 0.2 %   Neutrophils Relative % 89 %   Neutro Abs 11.4 (H) 1.7 - 7.7 K/uL   Lymphocytes Relative 6 %   Lymphs Abs 0.7 0.7 - 4.0 K/uL   Monocytes Relative 4 %   Monocytes Absolute 0.5 0.1 - 1.0 K/uL   Eosinophils Relative 0 %   Eosinophils Absolute 0.0 0.0 - 0.5 K/uL   Basophils Relative 0 %   Basophils Absolute 0.0 0.0 - 0.1 K/uL   Immature Granulocytes 1 %   Abs Immature Granulocytes 0.07 0.00 - 0.07 K/uL    Comment: Performed at [facility], [address] [place], [place] [address]  Lactic acid, plasma     Status: None   Collection Time: 04/09/20  9:27 AM  Result Value Ref Range   Lactic Acid, Venous 1.2 0.5 - 1.9 mmol/L    Comment: Performed at [facility], [address] [place], [place] [address]  Troponin I (High Sensitivity)     Status: None   Collection Time: 04/09/20  9:27 AM  Result Value Ref Range   Troponin I (High Sensitivity) 8 <18 ng/L    Comment: (NOTE) Elevated high sensitivity troponin I (hsTnI) values and significant  changes across serial measurements may suggest ACS but many other  chronic and acute conditions are  known to elevate hsTnI results.  Refer to the "Links" section for chest pain algorithms and additional  guidance. Performed at [facility], [address] [place], [place] [address]   SARS Coronavirus 2 by RT  PCR (hospital order, performed in [facility] hospital lab) Nasopharyngeal Nasopharyngeal Swab     Status: None   Collection Time: 04/09/20 12:23 PM   Specimen: Nasopharyngeal Swab  Result Value Ref Range   SARS Coronavirus 2 NEGATIVE NEGATIVE    Comment: (NOTE) SARS-CoV-2 target nucleic acids are NOT DETECTED. The SARS-CoV-2 RNA is generally detectable in upper and lower respiratory specimens during the acute phase of infection. The lowest concentration of SARS-CoV-2 viral copies this assay can detect is 250 copies / mL. A negative result does not preclude SARS-CoV-2 infection and should not be used as the sole basis for treatment or other patient management decisions.  A negative result may occur with improper specimen collection / handling, submission of specimen other than nasopharyngeal swab, presence of viral mutation(s) within the areas targeted by this assay, and inadequate number of viral copies (<250 copies / mL). A negative result must be combined with clinical observations, patient history, and epidemiological information. Fact Sheet for Patients:   StrictlyIdeas.no Fact Sheet for Healthcare Providers: BankingDealers.co.za This test is not yet approved or cleared  by the [place] FDA and has been authorized for detection and/or diagnosis of SARS-CoV-2 by FDA under an Emergency Use Authorization (EUA).  This EUA will remain in effect (meaning this test can be used) for the duration of the COVID-19 declaration under Section 564(b)(1) of the Act, 21 U.S.C. section 360bbb-3(b)(1), unless the authorization is terminated or revoked sooner. Performed at [facility], [address]  [place], [place] [address]    CT ABDOMEN PELVIS W CONTRAST  Result Date: 04/09/2020 CLINICAL DATA:  Abdominal pain EXAM: CT ABDOMEN AND PELVIS WITH CONTRAST TECHNIQUE: Multidetector CT imaging of the abdomen and pelvis was performed using the standard protocol following bolus administration of intravenous contrast. CONTRAST:  139mL OMNIPAQUE IOHEXOL 300 MG/ML  SOLN COMPARISON:  None. FINDINGS: Lower chest: There is mild atelectatic change in the anterior left base. Lung bases otherwise are clear. Hepatobiliary: There is a degree of hepatic steatosis. No focal liver lesions are evident. The gallbladder appears distended with suggestion of pericholecystic fluid. There is an area of increased attenuation in the inferior aspect of the gallbladder measuring 3.1 x 2.8 cm. Question mass within the gallbladder versus accumulation of sludge. There is borderline intrahepatic biliary duct dilatation. There is no common hepatic or common bile duct dilatation. No obstructing lesions seen in the biliary ductal system. Pancreas: No appreciable pancreatic mass or inflammatory focus. Spleen: No splenic lesions are evident. Adrenals/Urinary Tract: Adrenals bilaterally appear unremarkable. There is a 5 mm probable cyst in the medial upper to mid right kidney. No evident hydronephrosis on either side. There is no appreciable renal or ureteral calculus on either side. Urinary bladder is midline with wall thickness within normal limits. Stomach/Bowel: There are scattered sigmoid diverticula which do not appear overtly inflamed. There is, however, mild wall thickening in the mid sigmoid colon with subtle mesenteric thickening in this area. No similar areas of apparent bowel inflammation noted elsewhere in the abdomen or pelvis. No evident bowel obstruction. The terminal ileum appears unremarkable. There is no evident free air or portal venous air. Vascular/Lymphatic: No abdominal aortic aneurysm. There is aortic atherosclerosis. Major  venous structures appear patent. There is no evident adenopathy in the abdomen or pelvis. Reproductive: Uterus appears postmenopausal. There is extensive periuterine vascular calcification. No pelvic mass evident. Other: Appendix appears normal. No abscess or ascites evident in the abdomen or pelvis. Musculoskeletal: There is slight anterolisthesis of  L4 on L5. No pars defects demonstrable on this study. There is degenerative change in the mid to lower lumbar spine. There are no appreciable blastic or lytic bone lesions. There is no intramuscular or abdominal wall lesion. IMPRESSION: 1. Gallbladder appears distended with suggestion of pericholecystic fluid. Question mass versus accumulated sludge in the inferior gallbladder. Question a degree of acute cholecystitis. Advise correlation with ultrasound of the gallbladder to further evaluate. Slight intrahepatic biliary duct dilatation may be a consequence of the changes is in the gallbladder. 2. Colitis versus early diverticulitis in the mid sigmoid colon. No bowel inflammation elsewhere noted. No bowel obstruction. No abscess in the abdomen or pelvis. Appendix appears normal. 3. Extensive periuterine vascular calcification. Question pelvic congestion syndrome. 4.  Hepatic steatosis. 5.  Aortic Atherosclerosis (ICD10-I70.0). Electronically Signed   By: Lowella Grip III M.D.   On: 04/09/2020 11:09   DG Chest Portable 1 View  Result Date: 04/09/2020 CLINICAL DATA:  Chest pain EXAM: PORTABLE CHEST 1 VIEW COMPARISON:  October 18, 2019 FINDINGS: Lungs are clear. There is an azygos lobe on the right, an anatomic variant. Heart is upper normal in size with pulmonary vascularity normal. No adenopathy. No pneumothorax. No bone lesions. IMPRESSION: Lungs clear.  Heart upper normal in size. Electronically Signed   By: Lowella Grip III M.D.   On: 04/09/2020 09:38   US Abdomen Limited RUQ  Result Date: 04/09/2020 CLINICAL DATA:  Abdominal pain. Abnormal  gallbladder on CT study from earlier today. EXAM: ULTRASOUND ABDOMEN LIMITED RIGHT UPPER QUADRANT COMPARISON:  04/09/2020 CT abdomen/pelvis. FINDINGS: Gallbladder: Numerous layering shadowing gallstones in the gallbladder, largest 8 mm. Mild diffuse gallbladder wall thickening. Mildly distended gallbladder. No pericholecystic fluid. No sonographic Murphy's sign reported. Common bile duct: Diameter: 6 mm proximally.  Mid to distal CBD obscured by bowel gas. Liver: No focal lesion identified. Within normal limits in parenchymal echogenicity. Portal vein is patent on color Doppler imaging with normal direction of blood flow towards the liver. Other: None. IMPRESSION: 1. Cholelithiasis with mild diffuse gallbladder wall thickening. Mildly distended gallbladder. No pericholecystic fluid. No sonographic Murphy sign. Ultrasound findings are equivocal for acute cholecystitis. Consider further evaluation with hepatobiliary scintigraphy as clinically warranted. 2. Top normal caliber common bile duct (6 mm diameter). 3. Normal liver. Electronically Signed   By: Ilona Sorrel M.D.   On: 04/09/2020 12:20      Assessment/Plan Hypothyroidism HLD Arthritis   Abdominal pain Leukocytosis - WBC 12.7, afebrile ?cholecystitis - US shows cholelithiasis but no definite cholecystitis, LFTs and Tbili normal, RUQ not all that ttp on exam -   recommend HIDA to r/o cholecystitis  ?colitis - recommend bowel rest and IV abx  FEN: ice chips and sip, IVF VTE: SCDs, lovenox ID: [id number]  Norm Parcel, [facility] Surgery 04/09/2020, 4:08 PM Please see Amion for pager number during day hours 7:00am-4:30pm  Agree with above.  Daughter, Jairo Ben, is at the bedside.  She has one more daughter who lives in [place] and son who lives in [place].  She moved from [place] about 2 years ago.  Her husband is deceased. She looks younger than her stated age.  Her symptoms are vague.  They started lower  abdominal and are no upper abdominal/substernal.  She also has back pain and other vague complaints.  Not typical for cholecystitis or colitis.  HIDA scan will evaluate GB.  Alphonsa Overall, MD, [facility] Surgery Office phone:  [phone]

## 2020-04-09 NOTE — ED Notes (Signed)
To CT

## 2020-04-09 NOTE — ED Notes (Signed)
ED Provider at bedside. 

## 2020-04-09 NOTE — Progress Notes (Signed)
62 yof with abd pain, 2-3 days, vomiting.  difffusely tender abdomen  Dx:  Acute cholecystitis, may also have colitis/diverticulitis Intractable pain but not septic.   Dr. Lucia Gaskins, surgeon. HIDA scan suggested once patient arrives. Abx given.  BP running high.  Interestingly, patient is not on any blood pressure medicines at home. I suggested the ED physician to give IV hydralazine. Bradycardic in 70s. Medical telemetry bed requested.

## 2020-04-09 NOTE — Progress Notes (Signed)
Attempted to call report to RN to transfer pt to [id number]. RN stated patient hasn't been approved yet and they have staffing issues. Same response was given when I made my second attempt to transfer patient.

## 2020-04-09 NOTE — ED Notes (Signed)
Pt on monitor 

## 2020-04-09 NOTE — ED Triage Notes (Signed)
Abdominal pain for 2 - 3 days.  Took Omeprazole and Pepto Bismol without relief.  Patient stated that it is burning sensation radiating to her back.  She took Tums without relief.

## 2020-04-09 NOTE — ED Notes (Signed)
Report provided for [facility] for transport.

## 2020-04-09 NOTE — H&P (Addendum)
[facility] History and Physical  Cynthia Young [id number] DOB: 01-15-1930 DOA: 04/09/2020 PCP: Mosie Lukes, MD  Admitted from: Home Chief Complaint: Abdominal pain  History of Present Illness: Cynthia Young is a 86 y.o. female with past medical history of hypothyroidism, hiatal hernia, GERD, hyperlipidemia.  Patient presented to the ED today with complaint of acute worsening of abdominal cramping, diarrhea.  She started having the symptoms 4 to 6 weeks ago.  There were mild at first. Towards the end of April, routine blood work showed potassium level elevated to 5.2-5.4. On 5/4 -her PCP started her on Kayexalate which worsened diarrhea. Repeat potassium on 5/12 was better at 4.8.  Despite stopping Kayexalate, she continued to have diarrhea and cramping.  In the interval, a referral to GI was sent but she has not been seen yet.  She also has hiatal hernia and reflux.  Abdominal cramping got acutely worse last few days and is patient presented to the ED today.  In the ED, patient was afebrile, heart rate mostly in 50s, blood pressure mostly elevated up to a maximum of 178/74. Chemistry unremarkable except for blood glucose elevated 259, lactic acid normal at 1.2. CBC with WBC count elevated to 12.7. CT scan of abdomen pelvis was done with findings as below 1. Pericholecystic fluid suggestive of acute cholecystitis. Slight intrahepatic biliary duct dilatation  2. Colitis versus early diverticulitis in the mid sigmoid colon.  No bowel obstruction.  No abscess 3. Extensive periuterine vascular calcification. Question pelvic congestion syndrome. 4. Hepatic steatosis.  Ultrasound abdomen was done with findings as below: 1. Cholelithiasis with mild diffuse gallbladder wall thickening, mildly distended gallbladder. No pericholecystic fluid. No sonographic Murphy sign. Ultrasound findings are equivocal for acute cholecystitis.  2. Top normal caliber common bile duct (6 mm  diameter). 3. Normal liver.  ED physician called general surgery Dr. Lucia Gaskins.  HIDA scan was suggested. Patient was transferred to [facility] for further evaluation management. HIDA scan ordered.  Apparently only to be done tomorrow after 6 hours NPO.  At the time of my evaluation, patient was lying down in bed.  Pain controlled with IV morphine.  Her daughter was at bedside.  Review of Systems:  All systems were reviewed and were negative unless otherwise mentioned in the HPI   Past medical history: Past Medical History:  Diagnosis Date   Arthritis    Hyperlipidemia    Hypothyroid     Past surgical history: Past Surgical History:  Procedure Laterality Date   BUNIONECTOMY Left    FOOT SURGERY Right    bunion    Social History:  reports that she has never smoked. She has never used smokeless tobacco. She reports current alcohol use. She reports that she does not use drugs.  Allergies:  No Known Allergies  Family history:  Family History  Problem Relation Age of Onset   Cancer Mother    Heart disease Father    Arthritis Father        rheumatoid   Cancer Brother        colon     Home Meds: Prior to Admission medications   Medication Sig Start Date End Date Taking? Authorizing Provider  levothyroxine (SYNTHROID) 50 MCG tablet Take 1 tablet (50 mcg total) by mouth daily. 03/03/20   Mosie Lukes, MD  omeprazole (PRILOSEC OTC) 20 MG tablet Take 20 mg by mouth daily.    [provider]    Physical Exam: Vitals:   04/09/20 1330 04/09/20 1400 04/09/20  1411 04/09/20 1455  BP: (!) 157/68 (!) 158/64  (!) 167/66  Pulse: (!) 58 62  (!) 54  Resp:    14  Temp:   98.1 F (36.7 C) 98 F (36.7 C)  TempSrc:   Oral   SpO2: 97% 99%    Weight:      Height:       Wt Readings from Last 3 Encounters:  04/09/20 54.4 kg  03/20/20 53.5 kg  02/28/20 56 kg   Body mass index is 24.24 kg/m.  General exam: Appears calm and comfortable.  Elderly, very  pleasant Skin: No rashes, lesions or ulcers. HEENT: Atraumatic, normocephalic, supple neck, no obvious bleeding Lungs: Clear to auscultation bilaterally CVS: Bradycardic, no murmur GI/Abd soft, mild diffuse tenderness, more on the epigastric area CNS: Alert, awake, oriented x3 Psychiatry: Mood appropriate Extremities: No pedal edema, no calf tenderness     Consult Orders  (From admission, onward)         Start     Ordered   04/09/20 1249  Consult to hospitalist  ALL PATIENTS BEING ADMITTED/HAVING PROCEDURES NEED COVID-19 SCREENING---called, spoke with Marcello Moores @ 1250  Once    Comments: ALL PATIENTS BEING ADMITTED/HAVING PROCEDURES NEED COVID-19 SCREENING  Provider:  (Not yet assigned)  Question Answer Comment  Place call to: [facility]   Reason for Consult Admit      04/09/20 1248          Labs on Admission:   CBC: Recent Labs  Lab 04/09/20 0927  WBC 12.7*  NEUTROABS 11.4*  HGB 14.0  HCT 42.6  MCV 92.8  PLT [id number]    Basic Metabolic Panel: Recent Labs  Lab 04/09/20 0927  NA 138  K 4.2  CL 102  CO2 24  GLUCOSE 159*  BUN 13  CREATININE 0.73  CALCIUM 9.2    Liver Function Tests: Recent Labs  Lab 04/09/20 0927  AST 18  ALT 13  ALKPHOS 59  BILITOT 0.7  PROT 7.1  ALBUMIN 3.9   Recent Labs  Lab 04/09/20 0927  LIPASE 19   No results for input(s): AMMONIA in the last 168 hours.  Cardiac Enzymes: No results for input(s): CKTOTAL, CKMB, CKMBINDEX, TROPONINI in the last 168 hours.  BNP (last 3 results) No results for input(s): BNP in the last 8760 hours.  ProBNP (last 3 results) No results for input(s): PROBNP in the last 8760 hours.  CBG: No results for input(s): GLUCAP in the last 168 hours.  Lipase     Component Value Date/Time   LIPASE 19 04/09/2020 0927     Urinalysis    Component Value Date/Time   COLORURINE YELLOW 02/15/2020 0403   APPEARANCEUR CLEAR 02/15/2020 0403   LABSPEC 1.015 02/15/2020 0403   PHURINE 6.0  02/15/2020 0403   GLUCOSEU NEGATIVE 02/15/2020 0403   HGBUR NEGATIVE 02/15/2020 0403   BILIRUBINUR negative 03/20/2020 1222   KETONESUR negative 03/20/2020 1222   KETONESUR NEGATIVE 02/15/2020 0403   PROTEINUR negative 03/20/2020 1222   PROTEINUR NEGATIVE 02/15/2020 0403   UROBILINOGEN 0.2 03/20/2020 1222   NITRITE Negative 03/20/2020 1222   NITRITE NEGATIVE 02/15/2020 0403   LEUKOCYTESUR Negative 03/20/2020 1222   LEUKOCYTESUR TRACE (A) 02/15/2020 0403     Drugs of Abuse  No results found for: LABOPIA, COCAINSCRNUR, LABBENZ, AMPHETMU, THCU, LABBARB    Radiological Exams on Admission: CT ABDOMEN PELVIS W CONTRAST  Result Date: 04/09/2020 CLINICAL DATA:  Abdominal pain EXAM: CT ABDOMEN AND PELVIS WITH CONTRAST TECHNIQUE: Multidetector CT imaging of  the abdomen and pelvis was performed using the standard protocol following bolus administration of intravenous contrast. CONTRAST:  164mL OMNIPAQUE IOHEXOL 300 MG/ML  SOLN COMPARISON:  None. FINDINGS: Lower chest: There is mild atelectatic change in the anterior left base. Lung bases otherwise are clear. Hepatobiliary: There is a degree of hepatic steatosis. No focal liver lesions are evident. The gallbladder appears distended with suggestion of pericholecystic fluid. There is an area of increased attenuation in the inferior aspect of the gallbladder measuring 3.1 x 2.8 cm. Question mass within the gallbladder versus accumulation of sludge. There is borderline intrahepatic biliary duct dilatation. There is no common hepatic or common bile duct dilatation. No obstructing lesions seen in the biliary ductal system. Pancreas: No appreciable pancreatic mass or inflammatory focus. Spleen: No splenic lesions are evident. Adrenals/Urinary Tract: Adrenals bilaterally appear unremarkable. There is a 5 mm probable cyst in the medial upper to mid right kidney. No evident hydronephrosis on either side. There is no appreciable renal or ureteral calculus on either  side. Urinary bladder is midline with wall thickness within normal limits. Stomach/Bowel: There are scattered sigmoid diverticula which do not appear overtly inflamed. There is, however, mild wall thickening in the mid sigmoid colon with subtle mesenteric thickening in this area. No similar areas of apparent bowel inflammation noted elsewhere in the abdomen or pelvis. No evident bowel obstruction. The terminal ileum appears unremarkable. There is no evident free air or portal venous air. Vascular/Lymphatic: No abdominal aortic aneurysm. There is aortic atherosclerosis. Major venous structures appear patent. There is no evident adenopathy in the abdomen or pelvis. Reproductive: Uterus appears postmenopausal. There is extensive periuterine vascular calcification. No pelvic mass evident. Other: Appendix appears normal. No abscess or ascites evident in the abdomen or pelvis. Musculoskeletal: There is slight anterolisthesis of L4 on L5. No pars defects demonstrable on this study. There is degenerative change in the mid to lower lumbar spine. There are no appreciable blastic or lytic bone lesions. There is no intramuscular or abdominal wall lesion. IMPRESSION: 1. Gallbladder appears distended with suggestion of pericholecystic fluid. Question mass versus accumulated sludge in the inferior gallbladder. Question a degree of acute cholecystitis. Advise correlation with ultrasound of the gallbladder to further evaluate. Slight intrahepatic biliary duct dilatation may be a consequence of the changes is in the gallbladder. 2. Colitis versus early diverticulitis in the mid sigmoid colon. No bowel inflammation elsewhere noted. No bowel obstruction. No abscess in the abdomen or pelvis. Appendix appears normal. 3. Extensive periuterine vascular calcification. Question pelvic congestion syndrome. 4.  Hepatic steatosis. 5.  Aortic Atherosclerosis (ICD10-I70.0). Electronically Signed   By: Lowella Grip III M.D.   On: 04/09/2020  11:09   DG Chest Portable 1 View  Result Date: 04/09/2020 CLINICAL DATA:  Chest pain EXAM: PORTABLE CHEST 1 VIEW COMPARISON:  October 18, 2019 FINDINGS: Lungs are clear. There is an azygos lobe on the right, an anatomic variant. Heart is upper normal in size with pulmonary vascularity normal. No adenopathy. No pneumothorax. No bone lesions. IMPRESSION: Lungs clear.  Heart upper normal in size. Electronically Signed   By: Lowella Grip III M.D.   On: 04/09/2020 09:38   US Abdomen Limited RUQ  Result Date: 04/09/2020 CLINICAL DATA:  Abdominal pain. Abnormal gallbladder on CT study from earlier today. EXAM: ULTRASOUND ABDOMEN LIMITED RIGHT UPPER QUADRANT COMPARISON:  04/09/2020 CT abdomen/pelvis. FINDINGS: Gallbladder: Numerous layering shadowing gallstones in the gallbladder, largest 8 mm. Mild diffuse gallbladder wall thickening. Mildly distended gallbladder. No pericholecystic fluid. No sonographic  Murphy's sign reported. Common bile duct: Diameter: 6 mm proximally.  Mid to distal CBD obscured by bowel gas. Liver: No focal lesion identified. Within normal limits in parenchymal echogenicity. Portal vein is patent on color Doppler imaging with normal direction of blood flow towards the liver. Other: None. IMPRESSION: 1. Cholelithiasis with mild diffuse gallbladder wall thickening. Mildly distended gallbladder. No pericholecystic fluid. No sonographic Murphy sign. Ultrasound findings are equivocal for acute cholecystitis. Consider further evaluation with hepatobiliary scintigraphy as clinically warranted. 2. Top normal caliber common bile duct (6 mm diameter). 3. Normal liver. Electronically Signed   By: Ilona Sorrel M.D.   On: 04/09/2020 12:20     ------------------------------------------------------------------------------------------------------ Assessment/Plan: Active Problems:   Acute cholecystitis  Intractable abdominal pain -Ongoing abdominal pain for the last several weeks acutely worse  since yesterday. -Seems multifactorial: Chronic gastritis, acute cholecystitis, acute colitis. -Slightly elevated WBC count to 12.7. -CT abdomen and ultrasound abdomen findings as above suggestive of acute cholecystitis versus colitis. -Given IV Rocephin and IV Flagyl in ED.  Continue same. -Pending HIDA scan. Apparently only to be done tomorrow after 6 hours NPO. -General surgery consulted. -Continue pain control with IV morphine. -N.p.o. except ice chips -IV Protonix  Hypothyroidism -Synthroid.  Switch to IV while n.p.o.  Elevated blood pressure -Denies any history of hypertension, not on any blood pressure medicine at home. -Today, her blood pressure has been persistently elevated to 150s and 160s. -Partly because of pain.  Patient probably also has some degree of undiagnosed hypertension. -Continue to monitor blood pressure.  May need some oral agents by discharge.  Chronic sinus bradycardia -Patient is aware and asymptomatic.  Continue to monitor in telemetry  Mobility: Encourage ambulation Code Status:  Full code  DVT prophylaxis:  Lovenox subcu Antimicrobials:  IV Rocephin/IV Flagyl Fluid: Normal saline at 75 mL/h Diet: N.p.o. except for ice chips  Consultants: General surgery Family Communication:  Not at bedside  Status is: Inpatient  Remains inpatient appropriate because:IV treatments appropriate due to intensity of illness or inability to take PO and Inpatient level of care appropriate due to severity of illness   Dispo: The patient is from: Home              Anticipated d/c is to: Home              Anticipated d/c date is: > 3 days              Patient currently is not medically stable to d/c. -------------------------------------------------------------------------------------------------------------------------- Severity of Illness: The appropriate patient status for this patient is INPATIENT. Inpatient status is judged to be reasonable and necessary in  order to provide the required intensity of service to ensure the patient's safety. The patient's presenting symptoms, physical exam findings, and initial radiographic and laboratory data in the context of their chronic comorbidities is felt to place them at high risk for further clinical deterioration. Furthermore, it is not anticipated that the patient will be medically stable for discharge from the hospital within 2 midnights of admission. The following factors support the patient status of inpatient.   " The patient's presenting symptoms include abdominal pain. " The worrisome physical exam findings include abdominal tenderness. " The initial radiographic and laboratory data are worrisome because of suspected cholecystitis. " The chronic co-morbidities include not significant.   * I certify that at the point of admission it is my clinical judgment that the patient will require inpatient hospital care spanning beyond 2 midnights from the point of admission  due to high intensity of service, high risk for further deterioration and high frequency of surveillance required.*   -----------------------------------------------------------------------------------------------------  Cline Cools, MD [facility] Pager: [phone] (Secure Chat preferred). 04/09/2020

## 2020-04-09 NOTE — ED Provider Notes (Signed)
[place] [facility] DEPARTMENT Provider Note   CSN: [id number] Arrival date & time: 04/09/20  [id number]     History Chief Complaint  Patient presents with  . Abdominal Pain    Cynthia Young is a 86 y.o. female.  HPI 87 year old female presents with abdominal pain.  Started a couple days ago and was mostly lower/periumbilical.  However since yesterday has gotten worse and now is more in her upper abdomen and chest.  Feels like a burning.  The pain is severe and kept her up all night.  She denies any shortness of breath.  She has had pain somewhat similar to this in the past.  Had a little bit of vomiting spit.  No diarrhea.  She was constipated for a couple days but had a normal bowel movement this morning which did not relieve her symptoms.  Did seem to get somewhat worse after eating yesterday.  No fevers.  Occasionally will have pain in her back.   Past Medical History:  Diagnosis Date  . Arthritis   . Hyperlipidemia   . Hypothyroid     Patient Active Problem List   Diagnosis Date Noted  . Acute cholecystitis 04/09/2020  . Pharyngitis 02/10/2020  . Varicose veins of both lower extremities 08/03/2019  . Hiatal hernia with GERD 03/16/2019  . Myalgia 09/25/2018  . Preventative health care 03/30/2018  . Hypothyroid   . Hyperlipidemia     Past Surgical History:  Procedure Laterality Date  . BUNIONECTOMY Left   . FOOT SURGERY Right    bunion     OB History    Gravida  3   Para  3   Term      Preterm      AB      Living        SAB      TAB      Ectopic      Multiple      Live Births              Family History  Problem Relation Age of Onset  . Cancer Mother   . Heart disease Father   . Arthritis Father        rheumatoid  . Cancer Brother        colon    Social History   Tobacco Use  . Smoking status: Never Smoker  . Smokeless tobacco: Never Used  Substance Use Topics  . Alcohol use: Yes    Comment: socially  . Drug use:  Never    Home Medications Prior to Admission medications   Medication Sig Start Date End Date Taking? Authorizing Provider  levothyroxine (SYNTHROID) 50 MCG tablet Take 1 tablet (50 mcg total) by mouth daily. 03/03/20   Mosie Lukes, MD  omeprazole (PRILOSEC OTC) 20 MG tablet Take 20 mg by mouth daily.    [provider]    Allergies    Patient has no known allergies.  Review of Systems   Review of Systems  Constitutional: Negative for fever.  Respiratory: Negative for shortness of breath.   Cardiovascular: Positive for chest pain.  Gastrointestinal: Positive for abdominal pain, constipation and vomiting. Negative for diarrhea.  Musculoskeletal: Positive for back pain.  All other systems reviewed and are negative.   Physical Exam Updated Vital Signs BP (!) 167/66 (BP Location: Right Arm)   Pulse (!) 54   Temp 98 F (36.7 C)   Resp 14   Ht 4\' 11"  (1.499 m)  Wt 54.4 kg   SpO2 99% Comment: rm air  BMI 24.24 kg/m   Physical Exam Vitals and nursing note reviewed.  Constitutional:      Appearance: She is well-developed. She is not ill-appearing or diaphoretic.     Comments: Appears in pain  HENT:     Head: Normocephalic and atraumatic.     Right Ear: External ear normal.     Left Ear: External ear normal.     Nose: Nose normal.  Eyes:     General:        Right eye: No discharge.        Left eye: No discharge.  Cardiovascular:     Rate and Rhythm: Bradycardia present. Rhythm irregular.     Heart sounds: Normal heart sounds.  Pulmonary:     Effort: Pulmonary effort is normal.     Breath sounds: Normal breath sounds.  Abdominal:     Palpations: Abdomen is soft.     Tenderness: There is generalized abdominal tenderness.  Skin:    General: Skin is warm and dry.  Neurological:     Mental Status: She is alert.  Psychiatric:        Mood and Affect: Mood is not anxious.     ED Results / Procedures / Treatments   Labs (all labs ordered are listed, but  only abnormal results are displayed) Labs Reviewed  COMPREHENSIVE METABOLIC PANEL - Abnormal; Notable for the following components:      Result Value   Glucose, Bld 159 (*)    All other components within normal limits  CBC WITH DIFFERENTIAL/PLATELET - Abnormal; Notable for the following components:   WBC 12.7 (*)    Neutro Abs 11.4 (*)    All other components within normal limits  SARS CORONAVIRUS 2 BY RT PCR (HOSPITAL ORDER, [facility])  LIPASE, BLOOD  LACTIC ACID, PLASMA  URINALYSIS, ROUTINE W REFLEX MICROSCOPIC  TROPONIN I (HIGH SENSITIVITY)    EKG EKG Interpretation  Date/Time:  Wednesday Apr 09 2020 09:37:06 EDT Ventricular Rate:  55 PR Interval:    QRS Duration: 109 QT Interval:  447 QTC Calculation: 428 R Axis:   -55 Text Interpretation: Sinus bradycardia Atrial premature complex Second deg AVB, Mobitz I (Wenckebach) Left anterior fascicular block Abnormal R-wave progression, late transition Left ventricular hypertrophy similar to 2020 Confirmed by Sherwood Gambler [phone]) on 04/09/2020 9:40:26 AM   Radiology CT ABDOMEN PELVIS W CONTRAST  Result Date: 04/09/2020 CLINICAL DATA:  Abdominal pain EXAM: CT ABDOMEN AND PELVIS WITH CONTRAST TECHNIQUE: Multidetector CT imaging of the abdomen and pelvis was performed using the standard protocol following bolus administration of intravenous contrast. CONTRAST:  164mL OMNIPAQUE IOHEXOL 300 MG/ML  SOLN COMPARISON:  None. FINDINGS: Lower chest: There is mild atelectatic change in the anterior left base. Lung bases otherwise are clear. Hepatobiliary: There is a degree of hepatic steatosis. No focal liver lesions are evident. The gallbladder appears distended with suggestion of pericholecystic fluid. There is an area of increased attenuation in the inferior aspect of the gallbladder measuring 3.1 x 2.8 cm. Question mass within the gallbladder versus accumulation of sludge. There is borderline intrahepatic biliary  duct dilatation. There is no common hepatic or common bile duct dilatation. No obstructing lesions seen in the biliary ductal system. Pancreas: No appreciable pancreatic mass or inflammatory focus. Spleen: No splenic lesions are evident. Adrenals/Urinary Tract: Adrenals bilaterally appear unremarkable. There is a 5 mm probable cyst in the medial upper to mid right  kidney. No evident hydronephrosis on either side. There is no appreciable renal or ureteral calculus on either side. Urinary bladder is midline with wall thickness within normal limits. Stomach/Bowel: There are scattered sigmoid diverticula which do not appear overtly inflamed. There is, however, mild wall thickening in the mid sigmoid colon with subtle mesenteric thickening in this area. No similar areas of apparent bowel inflammation noted elsewhere in the abdomen or pelvis. No evident bowel obstruction. The terminal ileum appears unremarkable. There is no evident free air or portal venous air. Vascular/Lymphatic: No abdominal aortic aneurysm. There is aortic atherosclerosis. Major venous structures appear patent. There is no evident adenopathy in the abdomen or pelvis. Reproductive: Uterus appears postmenopausal. There is extensive periuterine vascular calcification. No pelvic mass evident. Other: Appendix appears normal. No abscess or ascites evident in the abdomen or pelvis. Musculoskeletal: There is slight anterolisthesis of L4 on L5. No pars defects demonstrable on this study. There is degenerative change in the mid to lower lumbar spine. There are no appreciable blastic or lytic bone lesions. There is no intramuscular or abdominal wall lesion. IMPRESSION: 1. Gallbladder appears distended with suggestion of pericholecystic fluid. Question mass versus accumulated sludge in the inferior gallbladder. Question a degree of acute cholecystitis. Advise correlation with ultrasound of the gallbladder to further evaluate. Slight intrahepatic biliary duct  dilatation may be a consequence of the changes is in the gallbladder. 2. Colitis versus early diverticulitis in the mid sigmoid colon. No bowel inflammation elsewhere noted. No bowel obstruction. No abscess in the abdomen or pelvis. Appendix appears normal. 3. Extensive periuterine vascular calcification. Question pelvic congestion syndrome. 4.  Hepatic steatosis. 5.  Aortic Atherosclerosis (ICD10-I70.0). Electronically Signed   By: Lowella Grip III M.D.   On: 04/09/2020 11:09   DG Chest Portable 1 View  Result Date: 04/09/2020 CLINICAL DATA:  Chest pain EXAM: PORTABLE CHEST 1 VIEW COMPARISON:  October 18, 2019 FINDINGS: Lungs are clear. There is an azygos lobe on the right, an anatomic variant. Heart is upper normal in size with pulmonary vascularity normal. No adenopathy. No pneumothorax. No bone lesions. IMPRESSION: Lungs clear.  Heart upper normal in size. Electronically Signed   By: Lowella Grip III M.D.   On: 04/09/2020 09:38   US Abdomen Limited RUQ  Result Date: 04/09/2020 CLINICAL DATA:  Abdominal pain. Abnormal gallbladder on CT study from earlier today. EXAM: ULTRASOUND ABDOMEN LIMITED RIGHT UPPER QUADRANT COMPARISON:  04/09/2020 CT abdomen/pelvis. FINDINGS: Gallbladder: Numerous layering shadowing gallstones in the gallbladder, largest 8 mm. Mild diffuse gallbladder wall thickening. Mildly distended gallbladder. No pericholecystic fluid. No sonographic Murphy's sign reported. Common bile duct: Diameter: 6 mm proximally.  Mid to distal CBD obscured by bowel gas. Liver: No focal lesion identified. Within normal limits in parenchymal echogenicity. Portal vein is patent on color Doppler imaging with normal direction of blood flow towards the liver. Other: None. IMPRESSION: 1. Cholelithiasis with mild diffuse gallbladder wall thickening. Mildly distended gallbladder. No pericholecystic fluid. No sonographic Murphy sign. Ultrasound findings are equivocal for acute cholecystitis. Consider  further evaluation with hepatobiliary scintigraphy as clinically warranted. 2. Top normal caliber common bile duct (6 mm diameter). 3. Normal liver. Electronically Signed   By: Ilona Sorrel M.D.   On: 04/09/2020 12:20    Procedures Procedures (including critical care time)  Medications Ordered in ED Medications  0.9 %  sodium chloride infusion (500 mLs Intravenous New Bag/Given 04/09/20 1128)  sodium chloride 0.9 % bolus 500 mL (0 mLs Intravenous Stopped 04/09/20 1117)  fentaNYL (SUBLIMAZE)  injection 50 mcg (50 mcg Intravenous Given 04/09/20 0935)  famotidine (PEPCID) IVPB 20 mg premix (0 mg Intravenous Stopped 04/09/20 1010)  iohexol (OMNIPAQUE) 300 MG/ML solution 100 mL (100 mLs Intravenous Contrast Given 04/09/20 1034)  fentaNYL (SUBLIMAZE) injection 50 mcg (50 mcg Intravenous Given 04/09/20 1121)  cefTRIAXone (ROCEPHIN) 2 g in sodium chloride 0.9 % 100 mL IVPB (0 g Intravenous Stopped 04/09/20 1215)    And  metroNIDAZOLE (FLAGYL) IVPB 500 mg (0 mg Intravenous Stopped 04/09/20 1339)  HYDROmorphone (DILAUDID) injection 0.5 mg (0.5 mg Intravenous Given 04/09/20 1250)    ED Course  I have reviewed the triage vital signs and the nursing notes.  Pertinent labs & imaging results that were available during my care of the patient were reviewed by me and considered in my medical decision making (see chart for details).    MDM Rules/Calculators/A&P                      Patient's labs, CT, right upper quadrant ultrasound, and ECG have all been personally reviewed and incorporated into medical decision making.  Diverticulitis/colitis could be possible but right now her pain is mostly upper abdominal making cholecystitis more of a concern.  She was given IV antibiotics.  I discussed with Dr. Lucia Gaskins of general surgery, who recommends hospitalist admission and HIDA scan.  General surgery will consult.  Patient admitted to [facility]. Final Clinical Impression(s) / ED Diagnoses Final diagnoses:  Abdominal  pain  Calculus of gallbladder with acute cholecystitis without obstruction    Rx / DC Orders ED Discharge Orders    None       Sherwood Gambler, MD 04/09/20 1520

## 2020-04-10 ENCOUNTER — Inpatient Hospital Stay (HOSPITAL_COMMUNITY): Payer: Medicare Other

## 2020-04-10 LAB — BASIC METABOLIC PANEL
Anion gap: 14 (ref 5–15)
BUN: 11 mg/dL (ref 8–23)
CO2: 20 mmol/L — ABNORMAL LOW (ref 22–32)
Calcium: 8.4 mg/dL — ABNORMAL LOW (ref 8.9–10.3)
Chloride: 102 mmol/L (ref 98–111)
Creatinine, Ser: 0.76 mg/dL (ref 0.44–1.00)
GFR calc Af Amer: >60 mL/min (ref >60)
GFR calc non Af Amer: >60 mL/min (ref >60)
Glucose, Bld: 115 mg/dL — ABNORMAL HIGH (ref 70–99)
Potassium: 3.7 mmol/L (ref 3.5–5.1)
Sodium: 136 mmol/L (ref 135–145)

## 2020-04-10 LAB — CBC
HCT: 40.5 % (ref 36.0–46.0)
Hemoglobin: 12.7 g/dL (ref 12.0–15.0)
MCH: 30.1 pg (ref 26.0–34.0)
MCHC: 31.4 g/dL (ref 30.0–36.0)
MCV: 96 fL (ref 80.0–100.0)
Platelets: 226 10*3/uL (ref 150–400)
RBC: 4.22 MIL/uL (ref 3.87–5.11)
RDW: 13.2 % (ref 11.5–15.5)
WBC: 14.9 10*3/uL — ABNORMAL HIGH (ref 4.0–10.5)
nRBC: 0 % (ref 0.0–0.2)

## 2020-04-10 MED ORDER — MORPHINE BOLUS VIA INFUSION: 2.0000 mg | Freq: Once | INTRAVENOUS | Status: DC

## 2020-04-10 MED ORDER — TECHNETIUM TC 99M MEBROFENIN IV KIT
5.0000 | PACK | Freq: Once | INTRAVENOUS | Status: AC
  Administered 2020-04-10: 5 via INTRAVENOUS

## 2020-04-10 MED ORDER — BISACODYL 10 MG RE SUPP
10.0000 mg | Freq: Once | RECTAL | Status: AC
  Administered 2020-04-10: 10 mg via RECTAL
  Filled 2020-04-10: qty 1

## 2020-04-10 MED ORDER — MORPHINE SULFATE (PF) 2 MG/ML IV SOLN
INTRAVENOUS | Status: AC
  Administered 2020-04-10: 2 mg via INTRAVENOUS
  Filled 2020-04-10: qty 1

## 2020-04-10 MED ORDER — LIP MEDEX EX OINT
TOPICAL_OINTMENT | CUTANEOUS | Status: AC
  Filled 2020-04-10: qty 7

## 2020-04-10 MED ORDER — PANTOPRAZOLE SODIUM 40 MG IV SOLR
40.0000 mg | Freq: Every day | INTRAVENOUS | Status: DC
  Administered 2020-04-12: 40 mg via INTRAVENOUS
  Filled 2020-04-10: qty 40

## 2020-04-10 NOTE — Progress Notes (Addendum)
[facility] Progress Note     Subjective:  Patient with unchanged abdominal pain. Denies nausea. No flatus or BM. Could not get HIDA last night.   Objective: Vital signs in last 24 hours: Temp:  [98 F (36.7 C)-99 F (37.2 C)] 98.5 F (36.9 C) (05/27 0441) Pulse Rate:  [40-64] 41 (05/27 0441) Resp:  [14-21] 16 (05/27 0441) BP: (126-188)/(54-75) 126/54 (05/27 0441) SpO2:  [92 %-100 %] 95 % (05/27 0441) Last BM Date: 04/09/20  Intake/Output from previous day: 05/26 0701 - 05/27 0700 In: 2081.2 [I.V.:781.2; IV Piggyback:1300] Out: 0  Intake/Output this shift: Total I/O In: 383.6 [I.V.:283.6; IV Piggyback:100] Out: -   PE: General: pleasant, WD, WN white female who is laying in bed in NAD HEENT: Sclera are anicteric.  PERRL.  Ears and nose without any masses or lesions.  Mouth is pink and moist Heart: regular, rate, and rhythm.  Normal s1,s2. No obvious murmurs, gallops, or rubs noted.  Palpable radial and pedal pulses bilaterally Lungs: CTAB, no wheezes, rhonchi, or rales noted.  Respiratory effort nonlabored Abd: soft, mildly ttp across lower abdomen, slightly more ttp in RUQ today, ND, +BS, no masses, hernias, or organomegaly MS: all 4 extremities are symmetrical with no cyanosis, clubbing, or edema. Skin: warm and dry with no masses, lesions, or rashes Neuro: Cranial nerves 2-12 grossly intact, sensation grossly intact throughout Psych: A&Ox3 with an appropriate affect.    Lab Results:  Recent Labs    04/09/20 0927 04/10/20 0428  WBC 12.7* 14.9*  HGB 14.0 12.7  HCT 42.6 40.5  PLT 250 226   BMET Recent Labs    04/09/20 0927 04/10/20 0428  NA 138 136  K 4.2 3.7  CL 102 102  CO2 24 20*  GLUCOSE 159* 115*  BUN 13 11  CREATININE 0.73 0.76  CALCIUM 9.2 8.4*   PT/INR No results for input(s): LABPROT, INR in the last 72 hours. CMP     Component Value Date/Time   NA 136 04/10/2020 0428   K 3.7 04/10/2020 0428   CL 102 04/10/2020 0428   CO2  20 (L) 04/10/2020 0428   GLUCOSE 115 (H) 04/10/2020 0428   BUN 11 04/10/2020 0428   CREATININE 0.76 04/10/2020 0428   CALCIUM 8.4 (L) 04/10/2020 0428   PROT 7.1 04/09/2020 0927   ALBUMIN 3.9 04/09/2020 0927   AST 18 04/09/2020 0927   ALT 13 04/09/2020 0927   ALKPHOS 59 04/09/2020 0927   BILITOT 0.7 04/09/2020 0927   GFRNONAA >60 04/10/2020 0428   GFRAA >60 04/10/2020 0428   Lipase     Component Value Date/Time   LIPASE 19 04/09/2020 0927       Studies/Results: CT ABDOMEN PELVIS W CONTRAST  Result Date: 04/09/2020 CLINICAL DATA:  Abdominal pain EXAM: CT ABDOMEN AND PELVIS WITH CONTRAST TECHNIQUE: Multidetector CT imaging of the abdomen and pelvis was performed using the standard protocol following bolus administration of intravenous contrast. CONTRAST:  18mL OMNIPAQUE IOHEXOL 300 MG/ML  SOLN COMPARISON:  None. FINDINGS: Lower chest: There is mild atelectatic change in the anterior left base. Lung bases otherwise are clear. Hepatobiliary: There is a degree of hepatic steatosis. No focal liver lesions are evident. The gallbladder appears distended with suggestion of pericholecystic fluid. There is an area of increased attenuation in the inferior aspect of the gallbladder measuring 3.1 x 2.8 cm. Question mass within the gallbladder versus accumulation of sludge. There is borderline intrahepatic biliary duct dilatation. There is no common hepatic or common bile  duct dilatation. No obstructing lesions seen in the biliary ductal system. Pancreas: No appreciable pancreatic mass or inflammatory focus. Spleen: No splenic lesions are evident. Adrenals/Urinary Tract: Adrenals bilaterally appear unremarkable. There is a 5 mm probable cyst in the medial upper to mid right kidney. No evident hydronephrosis on either side. There is no appreciable renal or ureteral calculus on either side. Urinary bladder is midline with wall thickness within normal limits. Stomach/Bowel: There are scattered sigmoid  diverticula which do not appear overtly inflamed. There is, however, mild wall thickening in the mid sigmoid colon with subtle mesenteric thickening in this area. No similar areas of apparent bowel inflammation noted elsewhere in the abdomen or pelvis. No evident bowel obstruction. The terminal ileum appears unremarkable. There is no evident free air or portal venous air. Vascular/Lymphatic: No abdominal aortic aneurysm. There is aortic atherosclerosis. Major venous structures appear patent. There is no evident adenopathy in the abdomen or pelvis. Reproductive: Uterus appears postmenopausal. There is extensive periuterine vascular calcification. No pelvic mass evident. Other: Appendix appears normal. No abscess or ascites evident in the abdomen or pelvis. Musculoskeletal: There is slight anterolisthesis of L4 on L5. No pars defects demonstrable on this study. There is degenerative change in the mid to lower lumbar spine. There are no appreciable blastic or lytic bone lesions. There is no intramuscular or abdominal wall lesion. IMPRESSION: 1. Gallbladder appears distended with suggestion of pericholecystic fluid. Question mass versus accumulated sludge in the inferior gallbladder. Question a degree of acute cholecystitis. Advise correlation with ultrasound of the gallbladder to further evaluate. Slight intrahepatic biliary duct dilatation may be a consequence of the changes is in the gallbladder. 2. Colitis versus early diverticulitis in the mid sigmoid colon. No bowel inflammation elsewhere noted. No bowel obstruction. No abscess in the abdomen or pelvis. Appendix appears normal. 3. Extensive periuterine vascular calcification. Question pelvic congestion syndrome. 4.  Hepatic steatosis. 5.  Aortic Atherosclerosis (ICD10-I70.0). Electronically Signed   By: Lowella Grip III M.D.   On: 04/09/2020 11:09   DG Chest Portable 1 View  Result Date: 04/09/2020 CLINICAL DATA:  Chest pain EXAM: PORTABLE CHEST 1 VIEW  COMPARISON:  October 18, 2019 FINDINGS: Lungs are clear. There is an azygos lobe on the right, an anatomic variant. Heart is upper normal in size with pulmonary vascularity normal. No adenopathy. No pneumothorax. No bone lesions. IMPRESSION: Lungs clear.  Heart upper normal in size. Electronically Signed   By: Lowella Grip III M.D.   On: 04/09/2020 09:38   US Abdomen Limited RUQ  Result Date: 04/09/2020 CLINICAL DATA:  Abdominal pain. Abnormal gallbladder on CT study from earlier today. EXAM: ULTRASOUND ABDOMEN LIMITED RIGHT UPPER QUADRANT COMPARISON:  04/09/2020 CT abdomen/pelvis. FINDINGS: Gallbladder: Numerous layering shadowing gallstones in the gallbladder, largest 8 mm. Mild diffuse gallbladder wall thickening. Mildly distended gallbladder. No pericholecystic fluid. No sonographic Murphy's sign reported. Common bile duct: Diameter: 6 mm proximally.  Mid to distal CBD obscured by bowel gas. Liver: No focal lesion identified. Within normal limits in parenchymal echogenicity. Portal vein is patent on color Doppler imaging with normal direction of blood flow towards the liver. Other: None. IMPRESSION: 1. Cholelithiasis with mild diffuse gallbladder wall thickening. Mildly distended gallbladder. No pericholecystic fluid. No sonographic Murphy sign. Ultrasound findings are equivocal for acute cholecystitis. Consider further evaluation with hepatobiliary scintigraphy as clinically warranted. 2. Top normal caliber common bile duct (6 mm diameter). 3. Normal liver. Electronically Signed   By: Ilona Sorrel M.D.   On: 04/09/2020 12:20  Anti-infectives: Anti-infectives (From admission, onward)   Start     Dose/Rate Route Frequency Ordered Stop   04/10/20 1200  cefTRIAXone (ROCEPHIN) 2 g in sodium chloride 0.9 % 100 mL IVPB     2 g 200 mL/hr over 30 Minutes Intravenous Every 24 hours 04/09/20 1609     04/09/20 2000  metroNIDAZOLE (FLAGYL) IVPB 500 mg     500 mg 100 mL/hr over 60 Minutes Intravenous  Every 8 hours 04/09/20 1609     04/09/20 1130  cefTRIAXone (ROCEPHIN) 2 g in sodium chloride 0.9 % 100 mL IVPB     2 g 200 mL/hr over 30 Minutes Intravenous  Once 04/09/20 1120 04/09/20 1215   04/09/20 1130  metroNIDAZOLE (FLAGYL) IVPB 500 mg     500 mg 100 mL/hr over 60 Minutes Intravenous  Once 04/09/20 1120 04/09/20 1339       Assessment/Plan Hypothyroidism HLD Arthritis   Abdominal pain Leukocytosis - WBC 14 from 12, afebrile ?cholecystitis - US shows cholelithiasis but no definite cholecystitis, LFTs and Tbili normal, RUQ slightly more ttp today - recommend HIDA to r/o cholecystitis  ?colitis - recommend bowel rest and IV abx, try suppository today  FEN: ice chips and sip, IVF VTE: SCDs, lovenox ID: rocephin/flagyl 5/26>>  LOS: 1 day    Norm Parcel , [facility] 04/10/2020, 9:24 AM Please see Amion for pager number during day hours 7:00am-4:30pm  Agree with above. Daughter - Izora Gala - in the room with the patient.  Her pain is more localized to the RUQ today - more suggestive of gall bladder disease. HIDA scan pending.  Apparently, she had some morphine earlier - so the HIDA scan is delayed.  Alphonsa Overall, MD, [facility] Office phone:  [phone]

## 2020-04-10 NOTE — Progress Notes (Signed)
PROGRESS NOTE  Cynthia Young  [id number] DOB: 09-13-1930 DOA: 04/09/2020 PCP: Mosie Lukes, MD   Brief Narrative: Cynthia Young is an 86 y.o. female with a history of hiatal hernia w/GERD on prn PPI and hypothyroidism on synthroid who presented to the ED 5/26 with abdominal cramping, loose stools for about 4-5 weeks which had gotten progressively worse, sometimes associated with food intake. Evaluation with labs revealed leukocytosis to 12.7k, LFTs wnl. CT abd/pelvis showed pericholecystic fluid, intrahepatic biliary ductal dilatation, hepatic steatosis, and mid-sigmoid colitis vs. early diverticulitis. RUQ U/S demonstrated cholelithiasis with gallbladder wall thickening and distention with 61mm proximal CBD. The patient was admitted to [facility] with equivocal findings, underwent HIDA 5/27 showing acute cholecystitis. She is to remain NPO with suspected plans for cholecystectomy 5/28.  Assessment & Plan: Active Problems:   Acute cholecystitis  Acute cholecystitis: Confirmed on HIDA scan.  - Continue NPO - IVF - IV analgesics, antiemetics - Continuing ceftriaxone, flagyl - Operative plan per general surgery. Her limited comorbidities are well controlled/optimized. Recent HbA1c 6.1%.   Sigmoid colitis vs. early diverticulitis: Unclear whether this is a significant finding radiographically.  - Continue bowel rest, IVF and abx as above.   Hypothyroidism: Recent TSH 1.48 - Continue synthroid (1/2 dose by IV while NPO)  Elevated BP: Likely related to pain, resolved.   DVT prophylaxis: Lovenox Code Status: Full Family Communication: Daughters at bedside Disposition Plan:  Status is: Inpatient  Remains inpatient appropriate because:Ongoing active pain requiring inpatient pain management, Ongoing diagnostic testing needed not appropriate for outpatient work up and IV treatments appropriate due to intensity of illness or inability to take PO   Dispo: The patient is from: Home  Anticipated d/c is to: TBD, PT/OT to be consulted starting postoperatively              Anticipated d/c date is: 2 days              Patient currently is not medically stable to d/c.  Consultants:   General surgery  Procedures:   None  Antimicrobials:  Ceftriaxone, flagyl 5/26 >>    Subjective: Abdominal pain had started as intermittent mostly in lower abdomen has definitively migrated into RUQ, constant, colicky, "terrible."   Objective: Vitals:   04/09/20 1455 04/09/20 1846 04/09/20 2147 04/10/20 0441  BP: (!) 167/66 (!) 151/54 136/71 (!) 126/54  Pulse: (!) 54 64 (!) 40 (!) 41  Resp: 14 16 18 16   Temp: 98 F (36.7 C) 99 F (37.2 C) 98.4 F (36.9 C) 98.5 F (36.9 C)  TempSrc:  Oral Oral Oral  SpO2:  96% 92% 95%  Weight:      Height:        Intake/Output Summary (Last 24 hours) at 04/10/2020 1748 Last data filed at 04/10/2020 [id number] Gross per 24 hour  Intake 1264.79 ml  Output 0 ml  Net 1264.79 ml   Filed Weights   04/09/20 0853  Weight: 54.4 kg    Gen: 86 y.o. female in no distress  Pulm: Non-labored breathing room air. Clear to auscultation bilaterally.  CV: Regular rate and rhythm. No murmur, rub, or gallop. No JVD, no pedal edema. GI: Abdomen soft, tender worst in RUQ with guarding, no rebound or peritoneal rigidity. Non-distended, with normoactive bowel sounds. No organomegaly or masses felt. Ext: Warm, no deformities Skin: No rashes, lesions or ulcers Neuro: Alert and oriented. No focal neurological deficits. Psych: Judgement and insight appear normal. Mood & affect appropriate.   Data Reviewed: I have personally  reviewed following labs and imaging studies  CBC: Recent Labs  Lab 04/09/20 0927 04/10/20 0428  WBC 12.7* 14.9*  NEUTROABS 11.4*  --   HGB 14.0 12.7  HCT 42.6 40.5  MCV 92.8 96.0  PLT 250 [id number]   Basic Metabolic Panel: Recent Labs  Lab 04/09/20 0927 04/10/20 0428  NA 138 136  K 4.2 3.7  CL 102 102  CO2 24 20*  GLUCOSE 159* 115*    BUN 13 11  CREATININE 0.73 0.76  CALCIUM 9.2 8.4*   GFR: Estimated Creatinine Clearance: 36.6 mL/min (by C-G formula based on SCr of 0.76 mg/dL). Liver Function Tests: Recent Labs  Lab 04/09/20 0927  AST 18  ALT 13  ALKPHOS 59  BILITOT 0.7  PROT 7.1  ALBUMIN 3.9   Recent Labs  Lab 04/09/20 0927  LIPASE 19   No results for input(s): AMMONIA in the last 168 hours. Coagulation Profile: No results for input(s): INR, PROTIME in the last 168 hours. Cardiac Enzymes: No results for input(s): CKTOTAL, CKMB, CKMBINDEX, TROPONINI in the last 168 hours. BNP (last 3 results) No results for input(s): PROBNP in the last 8760 hours. HbA1C: No results for input(s): HGBA1C in the last 72 hours. CBG: No results for input(s): GLUCAP in the last 168 hours. Lipid Profile: No results for input(s): CHOL, HDL, LDLCALC, TRIG, CHOLHDL, LDLDIRECT in the last 72 hours. Thyroid Function Tests: No results for input(s): TSH, T4TOTAL, FREET4, T3FREE, THYROIDAB in the last 72 hours. Anemia Panel: No results for input(s): VITAMINB12, FOLATE, FERRITIN, TIBC, IRON, RETICCTPCT in the last 72 hours. Urine analysis:    Component Value Date/Time   COLORURINE YELLOW 02/15/2020 0403   APPEARANCEUR CLEAR 02/15/2020 0403   LABSPEC 1.015 02/15/2020 0403   PHURINE 6.0 02/15/2020 0403   GLUCOSEU NEGATIVE 02/15/2020 0403   HGBUR NEGATIVE 02/15/2020 0403   BILIRUBINUR negative 03/20/2020 1222   KETONESUR negative 03/20/2020 1222   KETONESUR NEGATIVE 02/15/2020 0403   PROTEINUR negative 03/20/2020 1222   PROTEINUR NEGATIVE 02/15/2020 0403   UROBILINOGEN 0.2 03/20/2020 1222   NITRITE Negative 03/20/2020 1222   NITRITE NEGATIVE 02/15/2020 0403   LEUKOCYTESUR Negative 03/20/2020 1222   LEUKOCYTESUR TRACE (A) 02/15/2020 0403   Recent Results (from the past 240 hour(s))  SARS Coronavirus 2 by RT PCR (hospital order, performed in [facility] hospital lab) Nasopharyngeal Nasopharyngeal Swab     Status: None    Collection Time: 04/09/20 12:23 PM   Specimen: Nasopharyngeal Swab  Result Value Ref Range Status   SARS Coronavirus 2 NEGATIVE NEGATIVE Final    Comment: (NOTE) SARS-CoV-2 target nucleic acids are NOT DETECTED. The SARS-CoV-2 RNA is generally detectable in upper and lower respiratory specimens during the acute phase of infection. The lowest concentration of SARS-CoV-2 viral copies this assay can detect is 250 copies / mL. A negative result does not preclude SARS-CoV-2 infection and should not be used as the sole basis for treatment or other patient management decisions.  A negative result may occur with improper specimen collection / handling, submission of specimen other than nasopharyngeal swab, presence of viral mutation(s) within the areas targeted by this assay, and inadequate number of viral copies (<250 copies / mL). A negative result must be combined with clinical observations, patient history, and epidemiological information. Fact Sheet for Patients:   StrictlyIdeas.no Fact Sheet for Healthcare Providers: BankingDealers.co.za This test is not yet approved or cleared  by the [place] FDA and has been authorized for detection and/or diagnosis of SARS-CoV-2 by FDA under  an Emergency Use Authorization (EUA).  This EUA will remain in effect (meaning this test can be used) for the duration of the COVID-19 declaration under Section 564(b)(1) of the Act, 21 U.S.C. section 360bbb-3(b)(1), unless the authorization is terminated or revoked sooner. Performed at [facility], [address] [place], [place] [address]       Radiology Studies: NM Hepatobiliary Liver Func  Result Date: 04/10/2020 CLINICAL DATA:  Abdominal and chest pain for 3 weeks, severe for 3 days, nausea, vomiting; abnormal ultrasound abdomen with gallstones, gallbladder wall thickening, and distended gallbladder EXAM: NUCLEAR MEDICINE HEPATOBILIARY  IMAGING TECHNIQUE: Sequential images of the abdomen were obtained out to 60 minutes following intravenous administration of radiopharmaceutical. RADIOPHARMACEUTICALS:  5.0 mCi Tc-61m  Choletec IV COMPARISON:  Ultrasound abdomen 04/09/2020 FINDINGS: Prompt tracer extraction from bloodstream indicating normal hepatocellular function. Prompt excretion of tracer into biliary tree. Duodenum visualized at 9 minutes. At 1 hour, gallbladder had not visualized. Patient received 2 mg of morphine IV and imaging was continued for 30 minutes. Gallbladder failed to visualize following morphine augmentation. Findings are consistent with cystic duct obstruction and acute cholecystitis. IMPRESSION: Patent CBD. Nonvisualization of the gallbladder despite morphine administration consistent with cystic duct obstruction and acute cholecystitis. Electronically Signed   By: Lavonia Dana M.D.   On: 04/10/2020 16:57   CT ABDOMEN PELVIS W CONTRAST  Result Date: 04/09/2020 CLINICAL DATA:  Abdominal pain EXAM: CT ABDOMEN AND PELVIS WITH CONTRAST TECHNIQUE: Multidetector CT imaging of the abdomen and pelvis was performed using the standard protocol following bolus administration of intravenous contrast. CONTRAST:  154mL OMNIPAQUE IOHEXOL 300 MG/ML  SOLN COMPARISON:  None. FINDINGS: Lower chest: There is mild atelectatic change in the anterior left base. Lung bases otherwise are clear. Hepatobiliary: There is a degree of hepatic steatosis. No focal liver lesions are evident. The gallbladder appears distended with suggestion of pericholecystic fluid. There is an area of increased attenuation in the inferior aspect of the gallbladder measuring 3.1 x 2.8 cm. Question mass within the gallbladder versus accumulation of sludge. There is borderline intrahepatic biliary duct dilatation. There is no common hepatic or common bile duct dilatation. No obstructing lesions seen in the biliary ductal system. Pancreas: No appreciable pancreatic mass or  inflammatory focus. Spleen: No splenic lesions are evident. Adrenals/Urinary Tract: Adrenals bilaterally appear unremarkable. There is a 5 mm probable cyst in the medial upper to mid right kidney. No evident hydronephrosis on either side. There is no appreciable renal or ureteral calculus on either side. Urinary bladder is midline with wall thickness within normal limits. Stomach/Bowel: There are scattered sigmoid diverticula which do not appear overtly inflamed. There is, however, mild wall thickening in the mid sigmoid colon with subtle mesenteric thickening in this area. No similar areas of apparent bowel inflammation noted elsewhere in the abdomen or pelvis. No evident bowel obstruction. The terminal ileum appears unremarkable. There is no evident free air or portal venous air. Vascular/Lymphatic: No abdominal aortic aneurysm. There is aortic atherosclerosis. Major venous structures appear patent. There is no evident adenopathy in the abdomen or pelvis. Reproductive: Uterus appears postmenopausal. There is extensive periuterine vascular calcification. No pelvic mass evident. Other: Appendix appears normal. No abscess or ascites evident in the abdomen or pelvis. Musculoskeletal: There is slight anterolisthesis of L4 on L5. No pars defects demonstrable on this study. There is degenerative change in the mid to lower lumbar spine. There are no appreciable blastic or lytic bone lesions. There is no intramuscular or abdominal wall  lesion. IMPRESSION: 1. Gallbladder appears distended with suggestion of pericholecystic fluid. Question mass versus accumulated sludge in the inferior gallbladder. Question a degree of acute cholecystitis. Advise correlation with ultrasound of the gallbladder to further evaluate. Slight intrahepatic biliary duct dilatation may be a consequence of the changes is in the gallbladder. 2. Colitis versus early diverticulitis in the mid sigmoid colon. No bowel inflammation elsewhere noted. No bowel  obstruction. No abscess in the abdomen or pelvis. Appendix appears normal. 3. Extensive periuterine vascular calcification. Question pelvic congestion syndrome. 4.  Hepatic steatosis. 5.  Aortic Atherosclerosis (ICD10-I70.0). Electronically Signed   By: Lowella Grip III M.D.   On: 04/09/2020 11:09   DG Chest Portable 1 View  Result Date: 04/09/2020 CLINICAL DATA:  Chest pain EXAM: PORTABLE CHEST 1 VIEW COMPARISON:  October 18, 2019 FINDINGS: Lungs are clear. There is an azygos lobe on the right, an anatomic variant. Heart is upper normal in size with pulmonary vascularity normal. No adenopathy. No pneumothorax. No bone lesions. IMPRESSION: Lungs clear.  Heart upper normal in size. Electronically Signed   By: Lowella Grip III M.D.   On: 04/09/2020 09:38   US Abdomen Limited RUQ  Result Date: 04/09/2020 CLINICAL DATA:  Abdominal pain. Abnormal gallbladder on CT study from earlier today. EXAM: ULTRASOUND ABDOMEN LIMITED RIGHT UPPER QUADRANT COMPARISON:  04/09/2020 CT abdomen/pelvis. FINDINGS: Gallbladder: Numerous layering shadowing gallstones in the gallbladder, largest 8 mm. Mild diffuse gallbladder wall thickening. Mildly distended gallbladder. No pericholecystic fluid. No sonographic Murphy's sign reported. Common bile duct: Diameter: 6 mm proximally.  Mid to distal CBD obscured by bowel gas. Liver: No focal lesion identified. Within normal limits in parenchymal echogenicity. Portal vein is patent on color Doppler imaging with normal direction of blood flow towards the liver. Other: None. IMPRESSION: 1. Cholelithiasis with mild diffuse gallbladder wall thickening. Mildly distended gallbladder. No pericholecystic fluid. No sonographic Murphy sign. Ultrasound findings are equivocal for acute cholecystitis. Consider further evaluation with hepatobiliary scintigraphy as clinically warranted. 2. Top normal caliber common bile duct (6 mm diameter). 3. Normal liver. Electronically Signed   By: Ilona Sorrel M.D.   On: 04/09/2020 12:20    Scheduled Meds: . enoxaparin (LOVENOX) injection  40 mg Subcutaneous Q24H  . levothyroxine  25 mcg Intravenous Daily  . lip balm      . pantoprazole (PROTONIX) IV  40 mg Intravenous Q12H  . senna  1 tablet Oral BID  . sodium chloride flush  3 mL Intravenous Q12H   Continuous Infusions: . sodium chloride 500 mL (04/09/20 1128)  . sodium chloride 75 mL/hr at 04/10/20 0659  . cefTRIAXone (ROCEPHIN)  IV 2 g (04/10/20 1122)   And  . metronidazole 500 mg (04/10/20 1550)     LOS: 1 day   Time spent: 25 minutes.  Patrecia Pour, MD [facility] [email or web address] 04/10/2020, 5:48 PM

## 2020-04-10 NOTE — Progress Notes (Signed)
HIDA scan is positive for obstructed cystic duct - c/w cholecystitis.  Discussed with the patient and her daughter, Cynthia Young (by phone) about proceeding with surgery.  I discussed the indications and risks of gall bladder surgery.  The primary risks of gall bladder surgery include, but are not limited to, bleeding, infection, common bile duct injury, and open surgery.  There is also the risk that the patient may have continued symptoms after surgery.  We discussed the typical post-operative recovery course. I tried to answer the patient's questions.  She is tentatively scheduled for 7:30 AM tomorrow for lap cholecystectomy.  Alphonsa Overall, MD, [facility] Surgery Office phone:  [phone]

## 2020-04-11 ENCOUNTER — Encounter (HOSPITAL_COMMUNITY): Payer: Self-pay | Admitting: Internal Medicine

## 2020-04-11 ENCOUNTER — Inpatient Hospital Stay (HOSPITAL_COMMUNITY): Payer: Medicare Other

## 2020-04-11 ENCOUNTER — Inpatient Hospital Stay (HOSPITAL_COMMUNITY): Payer: Medicare Other | Admitting: Certified Registered Nurse Anesthetist

## 2020-04-11 ENCOUNTER — Encounter (HOSPITAL_COMMUNITY)
Admission: EM | Disposition: A | Discharge status: Home Health | Payer: Self-pay | Source: Home / Self Care | Attending: Family Medicine

## 2020-04-11 DIAGNOSIS — R932 Abnormal findings on diagnostic imaging of liver and biliary tract: Secondary | ICD-10-CM

## 2020-04-11 DIAGNOSIS — R1010 Upper abdominal pain, unspecified: Secondary | ICD-10-CM

## 2020-04-11 DIAGNOSIS — K819 Cholecystitis, unspecified: Secondary | ICD-10-CM

## 2020-04-11 DIAGNOSIS — K8050 Calculus of bile duct without cholangitis or cholecystitis without obstruction: Secondary | ICD-10-CM

## 2020-04-11 HISTORY — PX: CHOLECYSTECTOMY: SHX55

## 2020-04-11 LAB — COMPREHENSIVE METABOLIC PANEL
ALT: 9 U/L (ref 0–44)
AST: 12 U/L — ABNORMAL LOW (ref 15–41)
Albumin: 2.9 g/dL — ABNORMAL LOW (ref 3.5–5.0)
Alkaline Phosphatase: 45 U/L (ref 38–126)
Anion gap: 8 (ref 5–15)
BUN: 14 mg/dL (ref 8–23)
CO2: 21 mmol/L — ABNORMAL LOW (ref 22–32)
Calcium: 8.7 mg/dL — ABNORMAL LOW (ref 8.9–10.3)
Chloride: 111 mmol/L (ref 98–111)
Creatinine, Ser: 0.84 mg/dL (ref 0.44–1.00)
GFR calc Af Amer: >60 mL/min (ref >60)
GFR calc non Af Amer: >60 mL/min (ref >60)
Glucose, Bld: 87 mg/dL (ref 70–99)
Potassium: 4.2 mmol/L (ref 3.5–5.1)
Sodium: 140 mmol/L (ref 135–145)
Total Bilirubin: 0.7 mg/dL (ref 0.3–1.2)
Total Protein: 5.6 g/dL — ABNORMAL LOW (ref 6.5–8.1)

## 2020-04-11 LAB — CBC
HCT: 36.8 % (ref 36.0–46.0)
Hemoglobin: 11.6 g/dL — ABNORMAL LOW (ref 12.0–15.0)
MCH: 30.4 pg (ref 26.0–34.0)
MCHC: 31.5 g/dL (ref 30.0–36.0)
MCV: 96.6 fL (ref 80.0–100.0)
Platelets: 225 10*3/uL (ref 150–400)
RBC: 3.81 MIL/uL — ABNORMAL LOW (ref 3.87–5.11)
RDW: 13.4 % (ref 11.5–15.5)
WBC: 14.8 10*3/uL — ABNORMAL HIGH (ref 4.0–10.5)
nRBC: 0 % (ref 0.0–0.2)

## 2020-04-11 SURGERY — LAPAROSCOPIC CHOLECYSTECTOMY WITH INTRAOPERATIVE CHOLANGIOGRAM
Anesthesia: General | Site: Abdomen

## 2020-04-11 MED ORDER — BUPIVACAINE-EPINEPHRINE 0.25% -1:200000 IJ SOLN
INTRAMUSCULAR | Status: DC | PRN
  Administered 2020-04-11: 30 mL

## 2020-04-11 MED ORDER — OXYCODONE HCL 5 MG/5ML PO SOLN: 5.0000 mg | Freq: Once | ORAL | Status: DC | PRN

## 2020-04-11 MED ORDER — LIDOCAINE 2% (20 MG/ML) 5 ML SYRINGE
INTRAMUSCULAR | Status: DC | PRN
  Administered 2020-04-11: 100 mg via INTRAVENOUS

## 2020-04-11 MED ORDER — PROPOFOL 10 MG/ML IV BOLUS
INTRAVENOUS | Status: DC | PRN
  Administered 2020-04-11: 100 mg via INTRAVENOUS
  Administered 2020-04-11: 40 mg via INTRAVENOUS

## 2020-04-11 MED ORDER — ATROPINE SULFATE 1 MG/10ML IJ SOSY
PREFILLED_SYRINGE | INTRAMUSCULAR | Status: AC
  Filled 2020-04-11: qty 10

## 2020-04-11 MED ORDER — FENTANYL CITRATE (PF) 100 MCG/2ML IJ SOLN
INTRAMUSCULAR | Status: DC | PRN
  Administered 2020-04-11 (×6): 50 ug via INTRAVENOUS

## 2020-04-11 MED ORDER — ACETAMINOPHEN 10 MG/ML IV SOLN: 1000.0000 mg | Freq: Once | INTRAVENOUS | Status: DC | PRN

## 2020-04-11 MED ORDER — FENTANYL CITRATE (PF) 100 MCG/2ML IJ SOLN
INTRAMUSCULAR | Status: AC
  Filled 2020-04-11: qty 2

## 2020-04-11 MED ORDER — LACTATED RINGERS IV SOLN: INTRAVENOUS | Status: DC

## 2020-04-11 MED ORDER — ROCURONIUM BROMIDE 10 MG/ML (PF) SYRINGE
PREFILLED_SYRINGE | INTRAVENOUS | Status: AC
  Filled 2020-04-11: qty 10

## 2020-04-11 MED ORDER — MORPHINE SULFATE (PF) 4 MG/ML IV SOLN: 2.0000 mg | INTRAVENOUS | Status: DC | PRN

## 2020-04-11 MED ORDER — SUGAMMADEX SODIUM 200 MG/2ML IV SOLN
INTRAVENOUS | Status: DC | PRN
  Administered 2020-04-11: 200 mg via INTRAVENOUS

## 2020-04-11 MED ORDER — FENTANYL CITRATE (PF) 100 MCG/2ML IJ SOLN
INTRAMUSCULAR | Status: AC
  Administered 2020-04-11: 25 ug via INTRAVENOUS
  Filled 2020-04-11: qty 2

## 2020-04-11 MED ORDER — LIDOCAINE 2% (20 MG/ML) 5 ML SYRINGE
INTRAMUSCULAR | Status: AC
  Filled 2020-04-11: qty 5

## 2020-04-11 MED ORDER — 0.9 % SODIUM CHLORIDE (POUR BTL) OPTIME
TOPICAL | Status: DC | PRN
  Administered 2020-04-11: 1000 mL

## 2020-04-11 MED ORDER — ONDANSETRON HCL 4 MG/2ML IJ SOLN
INTRAMUSCULAR | Status: AC
  Filled 2020-04-11: qty 2

## 2020-04-11 MED ORDER — ROCURONIUM BROMIDE 10 MG/ML (PF) SYRINGE
PREFILLED_SYRINGE | INTRAVENOUS | Status: DC | PRN
  Administered 2020-04-11: 100 mg via INTRAVENOUS

## 2020-04-11 MED ORDER — PHENYLEPHRINE 40 MCG/ML (10ML) SYRINGE FOR IV PUSH (FOR BLOOD PRESSURE SUPPORT)
PREFILLED_SYRINGE | INTRAVENOUS | Status: AC
  Filled 2020-04-11: qty 10

## 2020-04-11 MED ORDER — DEXAMETHASONE SODIUM PHOSPHATE 4 MG/ML IJ SOLN
INTRAMUSCULAR | Status: DC | PRN
  Administered 2020-04-11: 4 mg via INTRAVENOUS

## 2020-04-11 MED ORDER — ENOXAPARIN SODIUM 40 MG/0.4ML ~~LOC~~ SOLN: 40.0000 mg | SUBCUTANEOUS | Status: DC

## 2020-04-11 MED ORDER — PROPOFOL 10 MG/ML IV BOLUS
INTRAVENOUS | Status: AC
  Filled 2020-04-11: qty 20

## 2020-04-11 MED ORDER — PHENYLEPHRINE 40 MCG/ML (10ML) SYRINGE FOR IV PUSH (FOR BLOOD PRESSURE SUPPORT)
PREFILLED_SYRINGE | INTRAVENOUS | Status: DC | PRN
  Administered 2020-04-11: 80 ug via INTRAVENOUS

## 2020-04-11 MED ORDER — OXYCODONE HCL 5 MG PO TABS: 5.0000 mg | ORAL_TABLET | Freq: Once | ORAL | Status: DC | PRN

## 2020-04-11 MED ORDER — DEXAMETHASONE SODIUM PHOSPHATE 10 MG/ML IJ SOLN
INTRAMUSCULAR | Status: AC
  Filled 2020-04-11: qty 1

## 2020-04-11 MED ORDER — BUPIVACAINE-EPINEPHRINE (PF) 0.25% -1:200000 IJ SOLN
INTRAMUSCULAR | Status: AC
  Filled 2020-04-11: qty 30

## 2020-04-11 MED ORDER — ATROPINE SULFATE 0.4 MG/ML IV SOSY
PREFILLED_SYRINGE | INTRAVENOUS | Status: DC | PRN
  Administered 2020-04-11: .4 mg via INTRAVENOUS

## 2020-04-11 MED ORDER — LACTATED RINGERS IV SOLN
INTRAVENOUS | Status: DC | PRN
  Administered 2020-04-11: 1000 mL

## 2020-04-11 MED ORDER — IOHEXOL 300 MG/ML  SOLN
INTRAMUSCULAR | Status: DC | PRN
  Administered 2020-04-11: 20 mL

## 2020-04-11 MED ORDER — FENTANYL CITRATE (PF) 100 MCG/2ML IJ SOLN
25.0000 ug | INTRAMUSCULAR | Status: DC | PRN
  Administered 2020-04-11 (×3): 25 ug via INTRAVENOUS

## 2020-04-11 MED ORDER — ONDANSETRON HCL 4 MG/2ML IJ SOLN
INTRAMUSCULAR | Status: DC | PRN
  Administered 2020-04-11: 4 mg via INTRAVENOUS

## 2020-04-11 SURGICAL SUPPLY — 43 items
APPLIER CLIP 5 13 M/L LIGAMAX5 (MISCELLANEOUS)
APPLIER CLIP ROT 10 11.4 M/L (STAPLE) ×3
CABLE HIGH FREQUENCY MONO STRZ (ELECTRODE) ×3 IMPLANT
CHLORAPREP W/TINT 26 (MISCELLANEOUS) ×3 IMPLANT
CHOLANGIOGRAM CATH TAUT (CATHETERS) ×3 IMPLANT
CLIP APPLIE 5 13 M/L LIGAMAX5 (MISCELLANEOUS) IMPLANT
CLIP APPLIE ROT 10 11.4 M/L (STAPLE) ×1 IMPLANT
CLOSURE WOUND 1/4X4 (GAUZE/BANDAGES/DRESSINGS)
COVER MAYO STAND STRL (DRAPES) ×3 IMPLANT
COVER SURGICAL LIGHT HANDLE (MISCELLANEOUS) ×3 IMPLANT
COVER WAND RF STERILE (DRAPES) IMPLANT
DECANTER SPIKE VIAL GLASS SM (MISCELLANEOUS) ×3 IMPLANT
DERMABOND ADVANCED (GAUZE/BANDAGES/DRESSINGS) ×2
DERMABOND ADVANCED .7 DNX12 (GAUZE/BANDAGES/DRESSINGS) ×1 IMPLANT
DRAPE C-ARM 42X120 X-RAY (DRAPES) ×3 IMPLANT
ELECT REM PT RETURN 15FT ADLT (MISCELLANEOUS) ×3 IMPLANT
ENDOLOOP SUT PDS II  0 18 (SUTURE) ×6
ENDOLOOP SUT PDS II 0 18 (SUTURE) ×3 IMPLANT
GLOVE SURG SYN 7.5  E (GLOVE) ×2
GLOVE SURG SYN 7.5 E (GLOVE) ×1 IMPLANT
GOWN STRL REUS W/TWL XL LVL3 (GOWN DISPOSABLE) ×9 IMPLANT
HEMOSTAT SURGICEL 4X8 (HEMOSTASIS) IMPLANT
IV CATH 14GX2 1/4 (CATHETERS) ×3 IMPLANT
IV SET EXTENSION CATH 6 NF (IV SETS) ×3 IMPLANT
KIT BASIN (CUSTOM PROCEDURE TRAY) ×3 IMPLANT
KIT TURNOVER KIT A (KITS) IMPLANT
PENCIL SMOKE EVACUATOR (MISCELLANEOUS) IMPLANT
POUCH RETRIEVAL ECOSAC 10 (ENDOMECHANICALS) ×1 IMPLANT
POUCH RETRIEVAL ECOSAC 10MM (ENDOMECHANICALS) ×2
SCISSORS LAP 5X35 DISP (ENDOMECHANICALS) ×3 IMPLANT
SET IRRIG TUBING LAPAROSCOPIC (IRRIGATION / IRRIGATOR) ×3 IMPLANT
SET TUBE SMOKE EVAC HIGH FLOW (TUBING) ×3 IMPLANT
SLEEVE ADV FIXATION 5X100MM (TROCAR) ×3 IMPLANT
STOPCOCK 4 WAY LG BORE MALE ST (IV SETS) ×3 IMPLANT
STRIP CLOSURE SKIN 1/4X4 (GAUZE/BANDAGES/DRESSINGS) IMPLANT
SUT MNCRL AB 4-0 PS2 18 (SUTURE) ×3 IMPLANT
SYR 10ML ECCENTRIC (SYRINGE) ×3 IMPLANT
TOWEL OR 17X26 10 PK STRL BLUE (TOWEL DISPOSABLE) ×3 IMPLANT
TOWEL OR NON WOVEN STRL DISP B (DISPOSABLE) ×3 IMPLANT
TRAY LAPAROSCOPIC (CUSTOM PROCEDURE TRAY) ×3 IMPLANT
TROCAR ADV FIXATION 11X100MM (TROCAR) ×3 IMPLANT
TROCAR ADV FIXATION 5X100MM (TROCAR) ×6 IMPLANT
TROCAR XCEL BLUNT TIP 100MML (ENDOMECHANICALS) ×3 IMPLANT

## 2020-04-11 NOTE — Progress Notes (Signed)
PT Cancellation Note  Patient Details Name: Cynthia Young MRN: [id number] DOB: [birth year]   Cancelled Treatment:    Reason Eval/Treat Not Completed: Patient at procedure or test/unavailable/going tom surgery today.   Claretha Cooper 04/11/2020, [place] Pager [phone] Office [phone]

## 2020-04-11 NOTE — Anesthesia Preprocedure Evaluation (Addendum)
Anesthesia Evaluation  Patient identified by MRN, date of birth, ID band Patient awake    Reviewed: Allergy & Precautions, NPO status , Patient's Chart, lab work & pertinent test results  Airway Mallampati: II  TM Distance: >3 FB Neck ROM: Full    Dental no notable dental hx.    Pulmonary neg pulmonary ROS,    Pulmonary exam normal breath sounds clear to auscultation       Cardiovascular negative cardio ROS   Rhythm:Irregular Rate:Normal     Neuro/Psych negative neurological ROS  negative psych ROS   GI/Hepatic Neg liver ROS, Acute cholecystitis    Endo/Other  Hypothyroidism   Renal/GU negative Renal ROS  negative genitourinary   Musculoskeletal negative musculoskeletal ROS (+)   Abdominal   Peds negative pediatric ROS (+)  Hematology negative hematology ROS (+)   Anesthesia Other Findings   Reproductive/Obstetrics negative OB ROS                            Anesthesia Physical Anesthesia Plan  ASA: III  Anesthesia Plan: General   Post-op Pain Management:    Induction: Intravenous  PONV Risk Score and Plan: 3 and Ondansetron, Dexamethasone and Treatment may vary due to age or medical condition  Airway Management Planned: Oral ETT  Additional Equipment:   Intra-op Plan:   Post-operative Plan: Extubation in OR  Informed Consent: I have reviewed the patients History and Physical, chart, labs and discussed the procedure including the risks, benefits and alternatives for the proposed anesthesia with the patient or authorized representative who has indicated his/her understanding and acceptance.     Dental advisory given  Plan Discussed with: CRNA and Surgeon  Anesthesia Plan Comments:        Anesthesia Quick Evaluation

## 2020-04-11 NOTE — Anesthesia Procedure Notes (Signed)
Procedure Name: Intubation Date/Time: 04/11/2020 7:47 AM Performed by: Claudia Desanctis, CRNA Pre-anesthesia Checklist: Patient identified, Emergency Drugs available, Suction available and Patient being monitored Patient Re-evaluated:Patient Re-evaluated prior to induction Oxygen Delivery Method: Circle system utilized Preoxygenation: Pre-oxygenation with 100% oxygen Induction Type: IV induction Ventilation: Mask ventilation without difficulty Laryngoscope Size: 2 and Miller Grade View: Grade I Tube type: Oral Number of attempts: 1 Airway Equipment and Method: Stylet Placement Confirmation: ETT inserted through vocal cords under direct vision,  positive ETCO2 and breath sounds checked- equal and bilateral Secured at: 20 cm Tube secured with: Tape Dental Injury: Teeth and Oropharynx as per pre-operative assessment

## 2020-04-11 NOTE — H&P (View-Only) (Signed)
Consultation  Referring Provider: Dr. Bonner Puna      Primary Care Physician:  Mosie Lukes, MD Primary Gastroenterologist: Althia Forts [facility] Primary)         Reason for Consultation:  Colitis and Abnormal IOC            HPI:   Cynthia Young is a 86 y.o. female with a past medical history as listed below who had cholecystectomy today, we are consulted in regards to colitis and abnormal IOC.     IOC today during lap chole showed 2 filling defects in the distal common bile duct.    Today, the patient is found drowsy after her surgery, but history is garnered from her daughter who is at bedside.  Apparently, patient presented to the hospital on 04/09/2020 with abdominal pain which started 3 weeks ago and was initially intermittent but became more constant and intense.  It started in her lower abdomen but moved to her upper abdomen and chest as well.  Described worsening after eating spaghetti and meatballs.  Associated symptoms include nausea.  Patient's daughter tells me that she has chronic constipation and there was no change in this until her potassium was corrected here in the hospital when she started with diarrhea.    Denies fever or chills.      ER course: 04/09/2020 CT of the abdomen and pelvis showed gallbladder distended with suggestion of pericholecystic fluid, slight intrahepatic biliary duct dilatation may be a consequence of the changes in the gallbladder.  Colitis versus early diverticulitis in the mid sigmoid colon.  No bowel inflammation or ulcer noted.  Extensive periuterine vascular calcification.  Hepatic steatosis.  Aortic atherosclerosis.  Past Medical History:  Diagnosis Date  . Arthritis   . Hyperlipidemia   . Hypothyroid     Past Surgical History:  Procedure Laterality Date  . BUNIONECTOMY Left   . FOOT SURGERY Right    bunion    Family History  Problem Relation Age of Onset  . Cancer Mother   . Heart disease Father   . Arthritis Father        rheumatoid   . Cancer Brother        colon    Social History   Tobacco Use  . Smoking status: Never Smoker  . Smokeless tobacco: Never Used  Substance Use Topics  . Alcohol use: Yes    Comment: socially  . Drug use: Never    Prior to Admission medications   Medication Sig Start Date End Date Taking? Authorizing Provider  levothyroxine (SYNTHROID) 50 MCG tablet Take 1 tablet (50 mcg total) by mouth daily. 03/03/20  Yes Mosie Lukes, MD  omeprazole (PRILOSEC OTC) 20 MG tablet Take 20 mg by mouth daily as needed (for stomach acid).    Yes [provider]  sodium polystyrene (KAYEXALATE) 15 GM/60ML suspension Take 60 g by mouth in the morning, at noon, and at bedtime.    Yes [provider]    Current Facility-Administered Medications  Medication Dose Route Frequency Provider Last Rate Last Admin  . 0.9 %  sodium chloride infusion   Intravenous Continuous Terrilee Croak, MD 75 mL/hr at 04/10/20 0659 New Bag at 04/10/20 0659  . 0.9 % irrigation (POUR BTL)    PRN Alphonsa Overall, MD   1,000 mL at 04/11/20 0816  . acetaminophen (OFIRMEV) IV 1,000 mg  1,000 mg Intravenous Once PRN Myrtie Soman, MD      . Doug Sou Hold] acetaminophen (TYLENOL) tablet 650  mg  650 mg Oral Q6H PRN Barkley Boards R, PA-C      . bupivacaine-EPINEPHrine (MARCAINE W/ EPI) 0.25% -1:200000 (with pres) injection    PRN Alphonsa Overall, MD   30 mL at 04/11/20 0921  . [MAR Hold] cefTRIAXone (ROCEPHIN) 2 g in sodium chloride 0.9 % 100 mL IVPB  2 g Intravenous Q24H Dahal, Binaya, MD 200 mL/hr at 04/10/20 1122 2 g at 04/10/20 1122   And  . [MAR Hold] metroNIDAZOLE (FLAGYL) IVPB 500 mg  500 mg Intravenous Q8H Dahal, Binaya, MD 100 mL/hr at 04/11/20 0424 500 mg at 04/11/20 0424  . [MAR Hold] enoxaparin (LOVENOX) injection 40 mg  40 mg Subcutaneous Q24H Dahal, Marlowe Aschoff, MD   40 mg at 04/10/20 2303  . fentaNYL (SUBLIMAZE) injection 25-50 mcg  25-50 mcg Intravenous Q5 min PRN Myrtie Soman, MD   25 mcg at 04/11/20 1005  . [MAR  Hold] HYDROcodone-acetaminophen (NORCO/VICODIN) 5-325 MG per tablet 1-2 tablet  1-2 tablet Oral Q4H PRN Terrilee Croak, MD   2 tablet at 04/10/20 1602  . iohexol (OMNIPAQUE) 300 MG/ML solution    PRN Alphonsa Overall, MD   20 mL at 04/11/20 0848  . lactated ringers infusion   Intravenous Continuous Myrtie Soman, MD 50 mL/hr at 04/11/20 [id number] Restarted at 04/11/20 0728  . lactated ringers infusion    Continuous PRN Alphonsa Overall, MD   1,000 mL at 04/11/20 0816  . [MAR Hold] levothyroxine (SYNTHROID, LEVOTHROID) injection 25 mcg  25 mcg Intravenous Daily Dahal, Marlowe Aschoff, MD   25 mcg at 04/10/20 0918  . [MAR Hold] magnesium hydroxide (MILK OF MAGNESIA) suspension 30 mL  30 mL Oral Daily PRN Dahal, Marlowe Aschoff, MD      . Doug Sou Hold] morphine 2 MG/ML injection 2 mg  2 mg Intravenous Q4H PRN Dahal, Marlowe Aschoff, MD   2 mg at 04/10/20 1445  . [MAR Hold] ondansetron (ZOFRAN) tablet 4 mg  4 mg Oral Q6H PRN Dahal, Marlowe Aschoff, MD       Or  . Doug Sou Hold] ondansetron (ZOFRAN) injection 4 mg  4 mg Intravenous Q6H PRN Dahal, Marlowe Aschoff, MD   4 mg at 04/09/20 2141  . oxyCODONE (Oxy IR/ROXICODONE) immediate release tablet 5 mg  5 mg Oral Once PRN Myrtie Soman, MD       Or  . oxyCODONE (ROXICODONE) 5 MG/5ML solution 5 mg  5 mg Oral Once PRN Myrtie Soman, MD      . Doug Sou Hold] pantoprazole (PROTONIX) injection 40 mg  40 mg Intravenous Daily Patrecia Pour, MD      . Doug Sou Hold] senna (SENOKOT) tablet 8.6 mg  1 tablet Oral BID Terrilee Croak, MD   8.6 mg at 04/10/20 2304  . [MAR Hold] sodium chloride flush (NS) 0.9 % injection 3 mL  3 mL Intravenous Q12H Dahal, Marlowe Aschoff, MD   3 mL at 04/10/20 0923  . [MAR Hold] sorbitol 70 % solution 30 mL  30 mL Oral Daily PRN Terrilee Croak, MD        Allergies as of 04/09/2020  . (No Known Allergies)    Review of Systems:    Constitutional: No weight loss, fever or chills Skin: No rash  Cardiovascular: No chest pain Respiratory: No SOB  Gastrointestinal: See HPI and otherwise negative Genitourinary: No  dysuria  Neurological: No headache Musculoskeletal: No new muscle or joint pain Hematologic: No bleeding  Psychiatric: No history of depression or anxiety    Physical Exam:  Vital signs in last 24 hours: Temp:  [  97.5 F (36.4 C)-99 F (37.2 C)] 97.5 F (36.4 C) (05/28 0946) Pulse Rate:  [70-96] 85 (05/28 1015) Resp:  [16-22] 19 (05/28 1015) BP: (101-142)/(55-88) 135/61 (05/28 1015) SpO2:  [93 %-98 %] 98 % (05/28 1015) Last BM Date: 04/08/20 General:   Pleasant Elderly Caucasian female appears to be in NAD, Well developed, Well nourished, alert and cooperative Head:  Normocephalic and atraumatic. Eyes:   PEERL, EOMI. No icterus. Conjunctiva pink. Ears:  Normal auditory acuity. Neck:  Supple Throat: Oral cavity and pharynx without inflammation, swelling or lesion.  Lungs: Respirations even and unlabored. Lungs clear to auscultation bilaterally.   No wheezes, crackles, or rhonchi.  Heart: Normal S1, S2. No MRG. Regular rate and rhythm. No peripheral edema, cyanosis or pallor.  Abdomen:  Soft, nondistended, tender RUQ-fresh surgical incision-clean bandage. No rebound or guarding. Normal bowel sounds. No appreciable masses or hepatomegaly. Rectal:  Not performed.  Msk:  Symmetrical without gross deformities. Peripheral pulses intact.  Extremities:  Without edema, no deformity or joint abnormality.  Neurologic:  Alert and  oriented x4;  grossly normal neurologically.  Skin:   Dry and intact without significant lesions or rashes. Psychiatric: Demonstrates good judgement and reason without abnormal affect or behaviors.Drowsy after sedation today   LAB RESULTS: Recent Labs    04/09/20 0927 04/10/20 0428 04/11/20 0420  WBC 12.7* 14.9* 14.8*  HGB 14.0 12.7 11.6*  HCT 42.6 40.5 36.8  PLT 250 226 225   BMET Recent Labs    04/09/20 0927 04/10/20 0428 04/11/20 0420  NA 138 136 140  K 4.2 3.7 4.2  CL 102 102 111  CO2 24 20* 21*  GLUCOSE 159* 115* 87  BUN 13 11 14   CREATININE  0.73 0.76 0.84  CALCIUM 9.2 8.4* 8.7*   LFT Recent Labs    04/11/20 0420  PROT 5.6*  ALBUMIN 2.9*  AST 12*  ALT 9  ALKPHOS 45  BILITOT 0.7   STUDIES: DG Cholangiogram Operative  Result Date: 04/11/2020 CLINICAL DATA:  Intraoperative cholangiogram during laparoscopic cholecystectomy. EXAM: INTRAOPERATIVE CHOLANGIOGRAM FLUOROSCOPY TIME:  23 seconds COMPARISON:  Nuclear medicine HIDA scan-04/10/2020; right upper quadrant abdominal ultrasound-04/09/2020; CT abdomen and pelvis-04/09/2020 FINDINGS: Intraoperative cholangiographic images of the right upper abdominal quadrant during laparoscopic cholecystectomy are provided for review. Surgical clips overlie the expected location of the gallbladder fossa. Contrast injection demonstrates selective cannulation of the central aspect of the cystic duct. There is passage of contrast through the central aspect of the cystic duct with filling of a non dilated common bile duct. There is passage of contrast though the CBD and into the descending portion of the duodenum. There is minimal reflux of injected contrast into the common hepatic duct and central aspect of the non dilated intrahepatic biliary system. There is a minimal amount of contrast extravasation about the cystic duct cannulation site. There is a persistent near occlusive filling defect in the distal aspect of the CBD worrisome for choledocholithiasis. IMPRESSION: Findings worrisome for near occlusive choledocholithiasis. For evaluation and management with ERCP could be performed as indicated. Electronically Signed   By: Sandi Mariscal M.D.   On: 04/11/2020 10:00   NM Hepatobiliary Liver Func  Result Date: 04/10/2020 CLINICAL DATA:  Abdominal and chest pain for 3 weeks, severe for 3 days, nausea, vomiting; abnormal ultrasound abdomen with gallstones, gallbladder wall thickening, and distended gallbladder EXAM: NUCLEAR MEDICINE HEPATOBILIARY IMAGING TECHNIQUE: Sequential images of the abdomen were  obtained out to 60 minutes following intravenous administration of radiopharmaceutical. RADIOPHARMACEUTICALS:  5.0  mCi Tc-5m  Choletec IV COMPARISON:  Ultrasound abdomen 04/09/2020 FINDINGS: Prompt tracer extraction from bloodstream indicating normal hepatocellular function. Prompt excretion of tracer into biliary tree. Duodenum visualized at 9 minutes. At 1 hour, gallbladder had not visualized. Patient received 2 mg of morphine IV and imaging was continued for 30 minutes. Gallbladder failed to visualize following morphine augmentation. Findings are consistent with cystic duct obstruction and acute cholecystitis. IMPRESSION: Patent CBD. Nonvisualization of the gallbladder despite morphine administration consistent with cystic duct obstruction and acute cholecystitis. Electronically Signed   By: Lavonia Dana M.D.   On: 04/10/2020 16:57   CT ABDOMEN PELVIS W CONTRAST  Result Date: 04/09/2020 CLINICAL DATA:  Abdominal pain EXAM: CT ABDOMEN AND PELVIS WITH CONTRAST TECHNIQUE: Multidetector CT imaging of the abdomen and pelvis was performed using the standard protocol following bolus administration of intravenous contrast. CONTRAST:  177mL OMNIPAQUE IOHEXOL 300 MG/ML  SOLN COMPARISON:  None. FINDINGS: Lower chest: There is mild atelectatic change in the anterior left base. Lung bases otherwise are clear. Hepatobiliary: There is a degree of hepatic steatosis. No focal liver lesions are evident. The gallbladder appears distended with suggestion of pericholecystic fluid. There is an area of increased attenuation in the inferior aspect of the gallbladder measuring 3.1 x 2.8 cm. Question mass within the gallbladder versus accumulation of sludge. There is borderline intrahepatic biliary duct dilatation. There is no common hepatic or common bile duct dilatation. No obstructing lesions seen in the biliary ductal system. Pancreas: No appreciable pancreatic mass or inflammatory focus. Spleen: No splenic lesions are evident.  Adrenals/Urinary Tract: Adrenals bilaterally appear unremarkable. There is a 5 mm probable cyst in the medial upper to mid right kidney. No evident hydronephrosis on either side. There is no appreciable renal or ureteral calculus on either side. Urinary bladder is midline with wall thickness within normal limits. Stomach/Bowel: There are scattered sigmoid diverticula which do not appear overtly inflamed. There is, however, mild wall thickening in the mid sigmoid colon with subtle mesenteric thickening in this area. No similar areas of apparent bowel inflammation noted elsewhere in the abdomen or pelvis. No evident bowel obstruction. The terminal ileum appears unremarkable. There is no evident free air or portal venous air. Vascular/Lymphatic: No abdominal aortic aneurysm. There is aortic atherosclerosis. Major venous structures appear patent. There is no evident adenopathy in the abdomen or pelvis. Reproductive: Uterus appears postmenopausal. There is extensive periuterine vascular calcification. No pelvic mass evident. Other: Appendix appears normal. No abscess or ascites evident in the abdomen or pelvis. Musculoskeletal: There is slight anterolisthesis of L4 on L5. No pars defects demonstrable on this study. There is degenerative change in the mid to lower lumbar spine. There are no appreciable blastic or lytic bone lesions. There is no intramuscular or abdominal wall lesion. IMPRESSION: 1. Gallbladder appears distended with suggestion of pericholecystic fluid. Question mass versus accumulated sludge in the inferior gallbladder. Question a degree of acute cholecystitis. Advise correlation with ultrasound of the gallbladder to further evaluate. Slight intrahepatic biliary duct dilatation may be a consequence of the changes is in the gallbladder. 2. Colitis versus early diverticulitis in the mid sigmoid colon. No bowel inflammation elsewhere noted. No bowel obstruction. No abscess in the abdomen or pelvis. Appendix  appears normal. 3. Extensive periuterine vascular calcification. Question pelvic congestion syndrome. 4.  Hepatic steatosis. 5.  Aortic Atherosclerosis (ICD10-I70.0). Electronically Signed   By: Lowella Grip III M.D.   On: 04/09/2020 11:09   US Abdomen Limited RUQ  Result Date: 04/09/2020 CLINICAL DATA:  Abdominal pain. Abnormal gallbladder on CT study from earlier today. EXAM: ULTRASOUND ABDOMEN LIMITED RIGHT UPPER QUADRANT COMPARISON:  04/09/2020 CT abdomen/pelvis. FINDINGS: Gallbladder: Numerous layering shadowing gallstones in the gallbladder, largest 8 mm. Mild diffuse gallbladder wall thickening. Mildly distended gallbladder. No pericholecystic fluid. No sonographic Murphy's sign reported. Common bile duct: Diameter: 6 mm proximally.  Mid to distal CBD obscured by bowel gas. Liver: No focal lesion identified. Within normal limits in parenchymal echogenicity. Portal vein is patent on color Doppler imaging with normal direction of blood flow towards the liver. Other: None. IMPRESSION: 1. Cholelithiasis with mild diffuse gallbladder wall thickening. Mildly distended gallbladder. No pericholecystic fluid. No sonographic Murphy sign. Ultrasound findings are equivocal for acute cholecystitis. Consider further evaluation with hepatobiliary scintigraphy as clinically warranted. 2. Top normal caliber common bile duct (6 mm diameter). 3. Normal liver. Electronically Signed   By: Ilona Sorrel M.D.   On: 04/09/2020 12:20    Impression / Plan:   Impression: 1.  Choledocholithiasis: Positive IOC during lap chole today 2.  Question colitis on CT: See CT above, in the mid sigmoid colon, no diarrhea until in hospital, patient is on antibiotics now 3.  Abdominal pain: Initially in lower abdomen, then progressed to upper quadrants per family; consider mostly gallbladder+/-questionable colitis/diverticulitis  Plan: 1.  Plans for ERCP tomorrow with Dr. Rush Landmark.  Did discuss risks, benefits, limitations and  alternatives and patient agrees to proceed. 2.  Patient to remain on a clear liquid diet today and n.p.o. after midnight. 3.  Continue current antibiotics. 4. Hold Lovenox this evening in preparation for ERCP tomorrow 5.  Please await any further recommendations from Dr. Rush Landmark later today.  Thank you for your kind consultation, we will continue to follow.  Lavone Nian [facility]  04/11/2020, 10:25 AM

## 2020-04-11 NOTE — Transfer of Care (Signed)
Immediate Anesthesia Transfer of Care Note  Patient: Cynthia Young  Procedure(s) Performed: LAPAROSCOPIC CHOLECYSTECTOMY WITH INTRAOPERATIVE CHOLANGIOGRAM (N/A Abdomen)  Patient Location: PACU  Anesthesia Type:General  Level of Consciousness: awake  Airway & Oxygen Therapy: Patient Spontanous Breathing and Patient connected to nasal cannula oxygen  Post-op Assessment: Report given to RN and Post -op Vital signs reviewed and stable  Post vital signs: Reviewed and stable  Last Vitals:  Vitals Value Taken Time  BP 142/88 04/11/20 0945  Temp    Pulse 95 04/11/20 0949  Resp 18 04/11/20 0949  SpO2 97 % 04/11/20 0949  Vitals shown include unvalidated device data.  Last Pain:  Vitals:   04/11/20 0656  TempSrc: Oral  PainSc:       Patients Stated Pain Goal: 2 [phone]  Complications: No apparent anesthesia complications

## 2020-04-11 NOTE — Progress Notes (Signed)
Pt return from PACU, VSs Tele replaced and verified, incisions to ABD dry and intact with surgical adhesive. IV patent, pt request pain med. Request oral pain med, Vicodin (2) PO given for 8/10 abd pain post surgical procedure. SRP, RN

## 2020-04-11 NOTE — Progress Notes (Signed)
Jewelry returned to daughter. SRP, RN

## 2020-04-11 NOTE — Discharge Instructions (Signed)
[facility]  Please arrive at least 30 min before your appointment to complete your check in paperwork.  If you are unable to arrive 30 min prior to your appointment time we may have to cancel or reschedule you. LAPAROSCOPIC SURGERY: POST OP INSTRUCTIONS Always review your discharge instruction sheet given to you by the facility where your surgery was performed. IF YOU HAVE DISABILITY OR FAMILY LEAVE FORMS, YOU MUST BRING THEM TO THE OFFICE FOR PROCESSING.   DO NOT GIVE THEM TO YOUR DOCTOR.  PAIN CONTROL  1. First take acetaminophen (Tylenol) AND/or ibuprofen (Advil) to control your pain after surgery.  Follow directions on package.  Taking acetaminophen (Tylenol) and/or ibuprofen (Advil) regularly after surgery will help to control your pain and lower the amount of prescription pain medication you may need.  You should not take more than 4,000 mg (4 grams) of acetaminophen (Tylenol) in 24 hours.  You should not take ibuprofen (Advil), aleve, motrin, naprosyn or other NSAIDS if you have a history of stomach ulcers or chronic kidney disease.  2. A prescription for pain medication may be given to you upon discharge.  Take your pain medication as prescribed, if you still have uncontrolled pain after taking acetaminophen (Tylenol) or ibuprofen (Advil). 3. Use ice packs to help control pain. 4. If you need a refill on your pain medication, please contact your pharmacy.  They will contact our office to request authorization. Prescriptions will not be filled after 5pm or on week-ends.  HOME MEDICATIONS 5. Take your usually prescribed medications unless otherwise directed.  DIET 6. You should follow a light diet the first few days after arrival home.  Be sure to include lots of fluids daily. Avoid fatty, fried foods.   CONSTIPATION 7. It is common to experience some constipation after surgery and if you are taking pain medication.  Increasing fluid intake and taking a stool  softener (such as Colace) will usually help or prevent this problem from occurring.  A mild laxative (Milk of Magnesia or Miralax) should be taken according to package instructions if there are no bowel movements after 48 hours.  WOUND/INCISION CARE 8. Most patients will experience some swelling and bruising in the area of the incisions.  Ice packs will help.  Swelling and bruising can take several days to resolve.  9. Unless discharge instructions indicate otherwise, follow guidelines below  a. STERI-STRIPS - you may remove your outer bandages 48 hours after surgery, and you may shower at that time.  You have steri-strips (small skin tapes) in place directly over the incision.  These strips should be left on the skin for 7-10 days.   b. DERMABOND/SKIN GLUE - you may shower in 24 hours.  The glue will flake off over the next 2-3 weeks. 10. Any sutures or staples will be removed at the office during your follow-up visit.  ACTIVITIES 11. You may resume regular (light) daily activities beginning the next day--such as daily self-care, walking, climbing stairs--gradually increasing activities as tolerated.  You may have sexual intercourse when it is comfortable.  Refrain from any heavy lifting or straining until approved by your doctor. a. You may drive when you are no longer taking prescription pain medication, you can comfortably wear a seatbelt, and you can safely maneuver your car and apply brakes.  FOLLOW-UP 12. You should see your doctor in the office for a follow-up appointment approximately 2-3 weeks after your surgery.  You should have been given your post-op/follow-up appointment when   your surgery was scheduled.  If you did not receive a post-op/follow-up appointment, make sure that you call for this appointment within a day or two after you arrive home to insure a convenient appointment time.   WHEN TO CALL YOUR DOCTOR: 1. Fever over 101.0 2. Inability to urinate 3. Continued bleeding from  incision. 4. Increased pain, redness, or drainage from the incision. 5. Increasing abdominal pain  The clinic staff is available to answer your questions during regular business hours.  Please don't hesitate to call and ask to speak to one of the nurses for clinical concerns.  If you have a medical emergency, go to the nearest emergency room or call 911.  A surgeon from [facility] is always on call at the hospital. [address], [address], [place], [place]  [address] ? [address] [address], [place], [place]   [address] [phone] ? [phone] ? FAX [phone]  .........   Managing Your Pain After Surgery Without Opioids    Thank you for participating in our program to help patients manage their pain after surgery without opioids. This is part of our effort to provide you with the best care possible, without exposing you or your family to the risk that opioids pose.  What pain can I expect after surgery? You can expect to have some pain after surgery. This is normal. The pain is typically worse the day after surgery, and quickly begins to get better. Many studies have found that many patients are able to manage their pain after surgery with Over-the-Counter (OTC) medications such as Tylenol and Motrin. If you have a condition that does not allow you to take Tylenol or Motrin, notify your surgical team.  How will I manage my pain? The best strategy for controlling your pain after surgery is around the clock pain control with Tylenol (acetaminophen) and Motrin (ibuprofen or Advil). Alternating these medications with each other allows you to maximize your pain control. In addition to Tylenol and Motrin, you can use heating pads or ice packs on your incisions to help reduce your pain.  How will I alternate your regular strength over-the-counter pain medication? You will take a dose of pain medication every three hours. ; Start by taking 650 mg of Tylenol (2 pills of 325  mg) ; 3 hours later take 600 mg of Motrin (3 pills of 200 mg) ; 3 hours after taking the Motrin take 650 mg of Tylenol ; 3 hours after that take 600 mg of Motrin.   - 1 -  See example - if your first dose of Tylenol is at 12:00 PM   12:00 PM Tylenol 650 mg (2 pills of 325 mg)  3:00 PM Motrin 600 mg (3 pills of 200 mg)  6:00 PM Tylenol 650 mg (2 pills of 325 mg)  9:00 PM Motrin 600 mg (3 pills of 200 mg)  Continue alternating every 3 hours   We recommend that you follow this schedule around-the-clock for at least 3 days after surgery, or until you feel that it is no longer needed. Use the table on the last page of this handout to keep track of the medications you are taking. Important: Do not take more than 3000mg of Tylenol or 3200mg of Motrin in a 24-hour period. Do not take ibuprofen/Motrin if you have a history of bleeding stomach ulcers, severe kidney disease, &/or actively taking a blood thinner  What if I still have pain? If you have pain that is not   controlled with the over-the-counter pain medications (Tylenol and Motrin or Advil) you might have what we call "breakthrough" pain. You will receive a prescription for a small amount of an opioid pain medication such as Oxycodone, Tramadol, or Tylenol with Codeine. Use these opioid pills in the first 24 hours after surgery if you have breakthrough pain. Do not take more than 1 pill every 4-6 hours.  If you still have uncontrolled pain after using all opioid pills, don't hesitate to call our staff using the number provided. We will help make sure you are managing your pain in the best way possible, and if necessary, we can provide a prescription for additional pain medication.   Day 1    Time  Name of Medication Number of pills taken  Amount of Acetaminophen  Pain Level   Comments  AM PM       AM PM       AM PM       AM PM       AM PM       AM PM       AM PM       AM PM       Total Daily amount of Acetaminophen Do not  take more than  3,000 mg per day      Day 2    Time  Name of Medication Number of pills taken  Amount of Acetaminophen  Pain Level   Comments  AM PM       AM PM       AM PM       AM PM       AM PM       AM PM       AM PM       AM PM       Total Daily amount of Acetaminophen Do not take more than  3,000 mg per day      Day 3    Time  Name of Medication Number of pills taken  Amount of Acetaminophen  Pain Level   Comments  AM PM       AM PM       AM PM       AM PM          AM PM       AM PM       AM PM       AM PM       Total Daily amount of Acetaminophen Do not take more than  3,000 mg per day      Day 4    Time  Name of Medication Number of pills taken  Amount of Acetaminophen  Pain Level   Comments  AM PM       AM PM       AM PM       AM PM       AM PM       AM PM       AM PM       AM PM       Total Daily amount of Acetaminophen Do not take more than  3,000 mg per day      Day 5    Time  Name of Medication Number of pills taken  Amount of Acetaminophen  Pain Level   Comments  AM PM       AM PM       AM   PM       AM PM       AM PM       AM PM       AM PM       AM PM       Total Daily amount of Acetaminophen Do not take more than  3,000 mg per day       Day 6    Time  Name of Medication Number of pills taken  Amount of Acetaminophen  Pain Level  Comments  AM PM       AM PM       AM PM       AM PM       AM PM       AM PM       AM PM       AM PM       Total Daily amount of Acetaminophen Do not take more than  3,000 mg per day      Day 7    Time  Name of Medication Number of pills taken  Amount of Acetaminophen  Pain Level   Comments  AM PM       AM PM       AM PM       AM PM       AM PM       AM PM       AM PM       AM PM       Total Daily amount of Acetaminophen Do not take more than  3,000 mg per day        For additional information about how and where to safely dispose of unused  opioid medications - https://www.morepowerful[employer].org  Disclaimer: This document contains information and/or instructional materials adapted from [facility] for the typical patient with your condition. It does not replace medical advice from your health care provider because your experience may differ from that of the typical patient. Talk to your health care provider if you have any questions about this document, your condition or your treatment plan. Adapted from [facility]   

## 2020-04-11 NOTE — Anesthesia Postprocedure Evaluation (Signed)
Anesthesia Post Note  Patient: Cynthia Young  Procedure(s) Performed: LAPAROSCOPIC CHOLECYSTECTOMY WITH INTRAOPERATIVE CHOLANGIOGRAM (N/A Abdomen)     Patient location during evaluation: PACU Anesthesia Type: General Level of consciousness: awake and alert Pain management: pain level controlled Vital Signs Assessment: post-procedure vital signs reviewed and stable Respiratory status: spontaneous breathing, nonlabored ventilation, respiratory function stable and patient connected to nasal cannula oxygen Cardiovascular status: blood pressure returned to baseline and stable Postop Assessment: no apparent nausea or vomiting Anesthetic complications: no    Last Vitals:  Vitals:   04/11/20 1030 04/11/20 1045  BP: (!) 151/69 (!) 135/58  Pulse: 88 81  Resp: 20 17  Temp:    SpO2: 95% 97%    Last Pain:  Vitals:   04/11/20 1045  TempSrc:   PainSc: Asleep                 Malkie Wille S

## 2020-04-11 NOTE — Progress Notes (Signed)
Ms. Holson is ready for surgery.  She had a better night last PM.  For lap chole with IOC.

## 2020-04-11 NOTE — Consult Note (Addendum)
Consultation  Referring Provider: Dr. Bonner Puna      Primary Care Physician:  Mosie Lukes, MD Primary Gastroenterologist: Althia Forts [facility])         Reason for Consultation:  Colitis and Abnormal IOC            HPI:   Cynthia Young is a 86 y.o. female with a past medical history as listed below who had cholecystectomy today, we are consulted in regards to colitis and abnormal IOC.     IOC today during lap chole showed 2 filling defects in the distal common bile duct.    Today, the patient is found drowsy after her surgery, but history is garnered from her daughter who is at bedside.  Apparently, patient presented to the hospital on 04/09/2020 with abdominal pain which started 3 weeks ago and was initially intermittent but became more constant and intense.  It started in her lower abdomen but moved to her upper abdomen and chest as well.  Described worsening after eating spaghetti and meatballs.  Associated symptoms include nausea.  Patient's daughter tells me that she has chronic constipation and there was no change in this until her potassium was corrected here in the hospital when she started with diarrhea.    Denies fever or chills.      ER course: 04/09/2020 CT of the abdomen and pelvis showed gallbladder distended with suggestion of pericholecystic fluid, slight intrahepatic biliary duct dilatation may be a consequence of the changes in the gallbladder.  Colitis versus early diverticulitis in the mid sigmoid colon.  No bowel inflammation or ulcer noted.  Extensive periuterine vascular calcification.  Hepatic steatosis.  Aortic atherosclerosis.  Past Medical History:  Diagnosis Date  . Arthritis   . Hyperlipidemia   . Hypothyroid     Past Surgical History:  Procedure Laterality Date  . BUNIONECTOMY Left   . FOOT SURGERY Right    bunion    Family History  Problem Relation Age of Onset  . Cancer Mother   . Heart disease Father   . Arthritis Father        rheumatoid   . Cancer Brother        colon    Social History   Tobacco Use  . Smoking status: Never Smoker  . Smokeless tobacco: Never Used  Substance Use Topics  . Alcohol use: Yes    Comment: socially  . Drug use: Never    Prior to Admission medications   Medication Sig Start Date End Date Taking? Authorizing Provider  levothyroxine (SYNTHROID) 50 MCG tablet Take 1 tablet (50 mcg total) by mouth daily. 03/03/20  Yes Mosie Lukes, MD  omeprazole (PRILOSEC OTC) 20 MG tablet Take 20 mg by mouth daily as needed (for stomach acid).    Yes [provider]  sodium polystyrene (KAYEXALATE) 15 GM/60ML suspension Take 60 g by mouth in the morning, at noon, and at bedtime.    Yes [provider]    Current Facility-Administered Medications  Medication Dose Route Frequency Provider Last Rate Last Admin  . 0.9 %  sodium chloride infusion   Intravenous Continuous Terrilee Croak, MD 75 mL/hr at 04/10/20 0659 New Bag at 04/10/20 0659  . 0.9 % irrigation (POUR BTL)    PRN Alphonsa Overall, MD   1,000 mL at 04/11/20 0816  . acetaminophen (OFIRMEV) IV 1,000 mg  1,000 mg Intravenous Once PRN Myrtie Soman, MD      . Doug Sou Hold] acetaminophen (TYLENOL) tablet 650  mg  650 mg Oral Q6H PRN Barkley Boards R, PA-C      . bupivacaine-EPINEPHrine (MARCAINE W/ EPI) 0.25% -1:200000 (with pres) injection    PRN Alphonsa Overall, MD   30 mL at 04/11/20 0921  . [MAR Hold] cefTRIAXone (ROCEPHIN) 2 g in sodium chloride 0.9 % 100 mL IVPB  2 g Intravenous Q24H Dahal, Binaya, MD 200 mL/hr at 04/10/20 1122 2 g at 04/10/20 1122   And  . [MAR Hold] metroNIDAZOLE (FLAGYL) IVPB 500 mg  500 mg Intravenous Q8H Dahal, Binaya, MD 100 mL/hr at 04/11/20 0424 500 mg at 04/11/20 0424  . [MAR Hold] enoxaparin (LOVENOX) injection 40 mg  40 mg Subcutaneous Q24H Dahal, Marlowe Aschoff, MD   40 mg at 04/10/20 2303  . fentaNYL (SUBLIMAZE) injection 25-50 mcg  25-50 mcg Intravenous Q5 min PRN Myrtie Soman, MD   25 mcg at 04/11/20 1005  . [MAR  Hold] HYDROcodone-acetaminophen (NORCO/VICODIN) 5-325 MG per tablet 1-2 tablet  1-2 tablet Oral Q4H PRN Terrilee Croak, MD   2 tablet at 04/10/20 1602  . iohexol (OMNIPAQUE) 300 MG/ML solution    PRN Alphonsa Overall, MD   20 mL at 04/11/20 0848  . lactated ringers infusion   Intravenous Continuous Myrtie Soman, MD 50 mL/hr at 04/11/20 [id number] Restarted at 04/11/20 0728  . lactated ringers infusion    Continuous PRN Alphonsa Overall, MD   1,000 mL at 04/11/20 0816  . [MAR Hold] levothyroxine (SYNTHROID, LEVOTHROID) injection 25 mcg  25 mcg Intravenous Daily Dahal, Marlowe Aschoff, MD   25 mcg at 04/10/20 0918  . [MAR Hold] magnesium hydroxide (MILK OF MAGNESIA) suspension 30 mL  30 mL Oral Daily PRN Dahal, Marlowe Aschoff, MD      . Doug Sou Hold] morphine 2 MG/ML injection 2 mg  2 mg Intravenous Q4H PRN Dahal, Marlowe Aschoff, MD   2 mg at 04/10/20 1445  . [MAR Hold] ondansetron (ZOFRAN) tablet 4 mg  4 mg Oral Q6H PRN Dahal, Marlowe Aschoff, MD       Or  . Doug Sou Hold] ondansetron (ZOFRAN) injection 4 mg  4 mg Intravenous Q6H PRN Dahal, Marlowe Aschoff, MD   4 mg at 04/09/20 2141  . oxyCODONE (Oxy IR/ROXICODONE) immediate release tablet 5 mg  5 mg Oral Once PRN Myrtie Soman, MD       Or  . oxyCODONE (ROXICODONE) 5 MG/5ML solution 5 mg  5 mg Oral Once PRN Myrtie Soman, MD      . Doug Sou Hold] pantoprazole (PROTONIX) injection 40 mg  40 mg Intravenous Daily Patrecia Pour, MD      . Doug Sou Hold] senna (SENOKOT) tablet 8.6 mg  1 tablet Oral BID Terrilee Croak, MD   8.6 mg at 04/10/20 2304  . [MAR Hold] sodium chloride flush (NS) 0.9 % injection 3 mL  3 mL Intravenous Q12H Dahal, Marlowe Aschoff, MD   3 mL at 04/10/20 0923  . [MAR Hold] sorbitol 70 % solution 30 mL  30 mL Oral Daily PRN Terrilee Croak, MD        Allergies as of 04/09/2020  . (No Known Allergies)    Review of Systems:    Constitutional: No weight loss, fever or chills Skin: No rash  Cardiovascular: No chest pain Respiratory: No SOB  Gastrointestinal: See HPI and otherwise negative Genitourinary: No  dysuria  Neurological: No headache Musculoskeletal: No new muscle or joint pain Hematologic: No bleeding  Psychiatric: No history of depression or anxiety    Physical Exam:  Vital signs in last 24 hours: Temp:  [  97.5 F (36.4 C)-99 F (37.2 C)] 97.5 F (36.4 C) (05/28 0946) Pulse Rate:  [70-96] 85 (05/28 1015) Resp:  [16-22] 19 (05/28 1015) BP: (101-142)/(55-88) 135/61 (05/28 1015) SpO2:  [93 %-98 %] 98 % (05/28 1015) Last BM Date: 04/08/20 General:   Pleasant Elderly Caucasian female appears to be in NAD, Well developed, Well nourished, alert and cooperative Head:  Normocephalic and atraumatic. Eyes:   PEERL, EOMI. No icterus. Conjunctiva pink. Ears:  Normal auditory acuity. Neck:  Supple Throat: Oral cavity and pharynx without inflammation, swelling or lesion.  Lungs: Respirations even and unlabored. Lungs clear to auscultation bilaterally.   No wheezes, crackles, or rhonchi.  Heart: Normal S1, S2. No MRG. Regular rate and rhythm. No peripheral edema, cyanosis or pallor.  Abdomen:  Soft, nondistended, tender RUQ-fresh surgical incision-clean bandage. No rebound or guarding. Normal bowel sounds. No appreciable masses or hepatomegaly. Rectal:  Not performed.  Msk:  Symmetrical without gross deformities. Peripheral pulses intact.  Extremities:  Without edema, no deformity or joint abnormality.  Neurologic:  Alert and  oriented x4;  grossly normal neurologically.  Skin:   Dry and intact without significant lesions or rashes. Psychiatric: Demonstrates good judgement and reason without abnormal affect or behaviors.Drowsy after sedation today   LAB RESULTS: Recent Labs    04/09/20 0927 04/10/20 0428 04/11/20 0420  WBC 12.7* 14.9* 14.8*  HGB 14.0 12.7 11.6*  HCT 42.6 40.5 36.8  PLT 250 226 225   BMET Recent Labs    04/09/20 0927 04/10/20 0428 04/11/20 0420  NA 138 136 140  K 4.2 3.7 4.2  CL 102 102 111  CO2 24 20* 21*  GLUCOSE 159* 115* 87  BUN 13 11 14   CREATININE  0.73 0.76 0.84  CALCIUM 9.2 8.4* 8.7*   LFT Recent Labs    04/11/20 0420  PROT 5.6*  ALBUMIN 2.9*  AST 12*  ALT 9  ALKPHOS 45  BILITOT 0.7   STUDIES: DG Cholangiogram Operative  Result Date: 04/11/2020 CLINICAL DATA:  Intraoperative cholangiogram during laparoscopic cholecystectomy. EXAM: INTRAOPERATIVE CHOLANGIOGRAM FLUOROSCOPY TIME:  23 seconds COMPARISON:  Nuclear medicine HIDA scan-04/10/2020; right upper quadrant abdominal ultrasound-04/09/2020; CT abdomen and pelvis-04/09/2020 FINDINGS: Intraoperative cholangiographic images of the right upper abdominal quadrant during laparoscopic cholecystectomy are provided for review. Surgical clips overlie the expected location of the gallbladder fossa. Contrast injection demonstrates selective cannulation of the central aspect of the cystic duct. There is passage of contrast through the central aspect of the cystic duct with filling of a non dilated common bile duct. There is passage of contrast though the CBD and into the descending portion of the duodenum. There is minimal reflux of injected contrast into the common hepatic duct and central aspect of the non dilated intrahepatic biliary system. There is a minimal amount of contrast extravasation about the cystic duct cannulation site. There is a persistent near occlusive filling defect in the distal aspect of the CBD worrisome for choledocholithiasis. IMPRESSION: Findings worrisome for near occlusive choledocholithiasis. For evaluation and management with ERCP could be performed as indicated. Electronically Signed   By: Sandi Mariscal M.D.   On: 04/11/2020 10:00   NM Hepatobiliary Liver Func  Result Date: 04/10/2020 CLINICAL DATA:  Abdominal and chest pain for 3 weeks, severe for 3 days, nausea, vomiting; abnormal ultrasound abdomen with gallstones, gallbladder wall thickening, and distended gallbladder EXAM: NUCLEAR MEDICINE HEPATOBILIARY IMAGING TECHNIQUE: Sequential images of the abdomen were  obtained out to 60 minutes following intravenous administration of radiopharmaceutical. RADIOPHARMACEUTICALS:  5.0  mCi Tc-10m  Choletec IV COMPARISON:  Ultrasound abdomen 04/09/2020 FINDINGS: Prompt tracer extraction from bloodstream indicating normal hepatocellular function. Prompt excretion of tracer into biliary tree. Duodenum visualized at 9 minutes. At 1 hour, gallbladder had not visualized. Patient received 2 mg of morphine IV and imaging was continued for 30 minutes. Gallbladder failed to visualize following morphine augmentation. Findings are consistent with cystic duct obstruction and acute cholecystitis. IMPRESSION: Patent CBD. Nonvisualization of the gallbladder despite morphine administration consistent with cystic duct obstruction and acute cholecystitis. Electronically Signed   By: Lavonia Dana M.D.   On: 04/10/2020 16:57   CT ABDOMEN PELVIS W CONTRAST  Result Date: 04/09/2020 CLINICAL DATA:  Abdominal pain EXAM: CT ABDOMEN AND PELVIS WITH CONTRAST TECHNIQUE: Multidetector CT imaging of the abdomen and pelvis was performed using the standard protocol following bolus administration of intravenous contrast. CONTRAST:  116mL OMNIPAQUE IOHEXOL 300 MG/ML  SOLN COMPARISON:  None. FINDINGS: Lower chest: There is mild atelectatic change in the anterior left base. Lung bases otherwise are clear. Hepatobiliary: There is a degree of hepatic steatosis. No focal liver lesions are evident. The gallbladder appears distended with suggestion of pericholecystic fluid. There is an area of increased attenuation in the inferior aspect of the gallbladder measuring 3.1 x 2.8 cm. Question mass within the gallbladder versus accumulation of sludge. There is borderline intrahepatic biliary duct dilatation. There is no common hepatic or common bile duct dilatation. No obstructing lesions seen in the biliary ductal system. Pancreas: No appreciable pancreatic mass or inflammatory focus. Spleen: No splenic lesions are evident.  Adrenals/Urinary Tract: Adrenals bilaterally appear unremarkable. There is a 5 mm probable cyst in the medial upper to mid right kidney. No evident hydronephrosis on either side. There is no appreciable renal or ureteral calculus on either side. Urinary bladder is midline with wall thickness within normal limits. Stomach/Bowel: There are scattered sigmoid diverticula which do not appear overtly inflamed. There is, however, mild wall thickening in the mid sigmoid colon with subtle mesenteric thickening in this area. No similar areas of apparent bowel inflammation noted elsewhere in the abdomen or pelvis. No evident bowel obstruction. The terminal ileum appears unremarkable. There is no evident free air or portal venous air. Vascular/Lymphatic: No abdominal aortic aneurysm. There is aortic atherosclerosis. Major venous structures appear patent. There is no evident adenopathy in the abdomen or pelvis. Reproductive: Uterus appears postmenopausal. There is extensive periuterine vascular calcification. No pelvic mass evident. Other: Appendix appears normal. No abscess or ascites evident in the abdomen or pelvis. Musculoskeletal: There is slight anterolisthesis of L4 on L5. No pars defects demonstrable on this study. There is degenerative change in the mid to lower lumbar spine. There are no appreciable blastic or lytic bone lesions. There is no intramuscular or abdominal wall lesion. IMPRESSION: 1. Gallbladder appears distended with suggestion of pericholecystic fluid. Question mass versus accumulated sludge in the inferior gallbladder. Question a degree of acute cholecystitis. Advise correlation with ultrasound of the gallbladder to further evaluate. Slight intrahepatic biliary duct dilatation may be a consequence of the changes is in the gallbladder. 2. Colitis versus early diverticulitis in the mid sigmoid colon. No bowel inflammation elsewhere noted. No bowel obstruction. No abscess in the abdomen or pelvis. Appendix  appears normal. 3. Extensive periuterine vascular calcification. Question pelvic congestion syndrome. 4.  Hepatic steatosis. 5.  Aortic Atherosclerosis (ICD10-I70.0). Electronically Signed   By: Lowella Grip III M.D.   On: 04/09/2020 11:09   US Abdomen Limited RUQ  Result Date: 04/09/2020 CLINICAL DATA:  Abdominal pain. Abnormal gallbladder on CT study from earlier today. EXAM: ULTRASOUND ABDOMEN LIMITED RIGHT UPPER QUADRANT COMPARISON:  04/09/2020 CT abdomen/pelvis. FINDINGS: Gallbladder: Numerous layering shadowing gallstones in the gallbladder, largest 8 mm. Mild diffuse gallbladder wall thickening. Mildly distended gallbladder. No pericholecystic fluid. No sonographic Murphy's sign reported. Common bile duct: Diameter: 6 mm proximally.  Mid to distal CBD obscured by bowel gas. Liver: No focal lesion identified. Within normal limits in parenchymal echogenicity. Portal vein is patent on color Doppler imaging with normal direction of blood flow towards the liver. Other: None. IMPRESSION: 1. Cholelithiasis with mild diffuse gallbladder wall thickening. Mildly distended gallbladder. No pericholecystic fluid. No sonographic Murphy sign. Ultrasound findings are equivocal for acute cholecystitis. Consider further evaluation with hepatobiliary scintigraphy as clinically warranted. 2. Top normal caliber common bile duct (6 mm diameter). 3. Normal liver. Electronically Signed   By: Ilona Sorrel M.D.   On: 04/09/2020 12:20    Impression / Plan:   Impression: 1.  Choledocholithiasis: Positive IOC during lap chole today 2.  Question colitis on CT: See CT above, in the mid sigmoid colon, no diarrhea until in hospital, patient is on antibiotics now 3.  Abdominal pain: Initially in lower abdomen, then progressed to upper quadrants per family; consider mostly gallbladder+/-questionable colitis/diverticulitis  Plan: 1.  Plans for ERCP tomorrow with Dr. Rush Landmark.  Did discuss risks, benefits, limitations and  alternatives and patient agrees to proceed. 2.  Patient to remain on a clear liquid diet today and n.p.o. after midnight. 3.  Continue current antibiotics. 4. Hold Lovenox this evening in preparation for ERCP tomorrow 5.  Please await any further recommendations from Dr. Rush Landmark later today.  Thank you for your kind consultation, we will continue to follow.  Lavone Nian [facility]  04/11/2020, 10:25 AM

## 2020-04-11 NOTE — Progress Notes (Addendum)
PROGRESS NOTE  Cynthia Young  [id number] DOB: [birth year] DOA: 04/09/2020 PCP: Mosie Lukes, MD   Brief Narrative: Cynthia Young is an 86 y.o. female with a history of hiatal hernia w/GERD on prn PPI and hypothyroidism on synthroid who presented to the ED 5/26 with abdominal cramping, loose stools for about 4-5 weeks which had gotten progressively worse, sometimes associated with food intake. Evaluation with labs revealed leukocytosis to 12.7k, LFTs wnl. CT abd/pelvis showed pericholecystic fluid, intrahepatic biliary ductal dilatation, hepatic steatosis, and mid-sigmoid colitis vs. early diverticulitis. RUQ U/S demonstrated cholelithiasis with gallbladder wall thickening and distention with 29mm proximal CBD. The patient was admitted to [facility] with equivocal findings, underwent HIDA 5/27 showing acute cholecystitis, and subsequently had laparoscopic cholecystectomy by Dr. Lucia Gaskins 04/11/2020. During the procedure acute and purulent discharge was noted and intraoperative cholangiogram showed filling defects in the common bile duct with contrast not extending to small bowel. GI has been consulted for ERCP which is planned 5/29.  Assessment & Plan: Active Problems:   Acute cholecystitis  Acute on chronic cholecystitis: Confirmed on HIDA scan with acute and chronic appearing inflammation at time of lap chole with omental adhesions.  - s/p lap cholecystectomy by Dr. Lucia Gaskins 04/11/2020.  - Continuing antibiotics as GB had purulence. Receptive to general surgery recommendations in this regard.  - Continue analgesics, antiemetics. Low rate IVF with LR to continue.  Choledocholithiasis: +IOC during lap chole today with at least 2 filling defects in CBD.  - GI has been consulted, recommending clear liquids, holding lovenox this evening, and proceeding with ERCP 5/29.   Sigmoid colitis vs. early diverticulitis: Unclear whether this is a significant finding radiographically. No gross evidence of this when viewed  laparoscopically. - Continue bowel rest, IVF and abx as above.   Hypothyroidism: Recent TSH 1.48 - Continue synthroid (1/2 dose by IV while NPO)  2nd degree AV block Mobitz 1 (Wenkebach): Noted on telemetry with intermittently bradycardic rate.  - Avoid AV nodal agents.  - Cine this is asymptomatic, we will continue monitoring without need for atropine. If becomes symptomatic or changes to type II, would need cardiology consultation.  Elevated BP: Likely related to pain, resolved.   DVT prophylaxis: Lovenox Code Status: Full Family Communication: Daughters at bedside Disposition Plan:  Status is: Inpatient  Remains inpatient appropriate because:Ongoing active pain requiring inpatient pain management, Ongoing diagnostic testing needed not appropriate for outpatient work up and IV treatments appropriate due to intensity of illness or inability to take PO   Dispo: The patient is from: Home              Anticipated d/c is to: TBD, PT/OT to be consulted starting postoperatively              Anticipated d/c date is: 2 days              Patient currently is not medically stable to d/c.  Consultants:   General surgery  Gastroenterology  Procedures:   None  Antimicrobials:  Ceftriaxone, flagyl 5/26 >>    Subjective: Seen following surgery this AM, pain improved with vicodin, still mostly RUQ, felt tired and in pain post op but no delirium. Aware of plan for ERCP tomorrow.  Objective: Vitals:   04/11/20 1015 04/11/20 1030 04/11/20 1045 04/11/20 1059  BP: 135/61 (!) 151/69 (!) 135/58 135/62  Pulse: 85 88 81 81  Resp: 19 20 17 18   Temp:    98.3 F (36.8 C)  TempSrc:  SpO2: 98% 95% 97% 99%  Weight:      Height:        Intake/Output Summary (Last 24 hours) at 04/11/2020 1228 Last data filed at 04/11/2020 0934 Gross per 24 hour  Intake 1021.43 ml  Output 5 ml  Net 1016.43 ml   Filed Weights   04/09/20 0853  Weight: 54.4 kg   Gen: 86 y.o. female in no  distress Pulm: Nonlabored breathing room air. Clear. CV: Regular rate and rhythm. No murmur, rub, or gallop. No JVD, no dependent edema. GI: Abdomen soft, appropriately tender, non-distended, with normoactive bowel sounds.  Ext: Warm, no deformities Skin: Laparoscopic sites c/d/i without any other significant rashes, lesions or ulcers on visualized skin. Neuro: Alert and oriented. No focal neurological deficits. Psych: Judgement and insight appear fair. Mood euthymic & affect congruent. Behavior is appropriate.    Data Reviewed: I have personally reviewed following labs and imaging studies  CBC: Recent Labs  Lab 04/09/20 0927 04/10/20 0428 04/11/20 0420  WBC 12.7* 14.9* 14.8*  NEUTROABS 11.4*  --   --   HGB 14.0 12.7 11.6*  HCT 42.6 40.5 36.8  MCV 92.8 96.0 96.6  PLT 250 226 [id number]   Basic Metabolic Panel: Recent Labs  Lab 04/09/20 0927 04/10/20 0428 04/11/20 0420  NA 138 136 140  K 4.2 3.7 4.2  CL 102 102 111  CO2 24 20* 21*  GLUCOSE 159* 115* 87  BUN 13 11 14   CREATININE 0.73 0.76 0.84  CALCIUM 9.2 8.4* 8.7*   GFR: Estimated Creatinine Clearance: 34.9 mL/min (by C-G formula based on SCr of 0.84 mg/dL). Liver Function Tests: Recent Labs  Lab 04/09/20 0927 04/11/20 0420  AST 18 12*  ALT 13 9  ALKPHOS 59 45  BILITOT 0.7 0.7  PROT 7.1 5.6*  ALBUMIN 3.9 2.9*   Recent Labs  Lab 04/09/20 0927  LIPASE 19   Urine analysis:    Component Value Date/Time   COLORURINE YELLOW 02/15/2020 0403   APPEARANCEUR CLEAR 02/15/2020 0403   LABSPEC 1.015 02/15/2020 0403   PHURINE 6.0 02/15/2020 0403   GLUCOSEU NEGATIVE 02/15/2020 0403   HGBUR NEGATIVE 02/15/2020 0403   BILIRUBINUR negative 03/20/2020 1222   KETONESUR negative 03/20/2020 1222   KETONESUR NEGATIVE 02/15/2020 0403   PROTEINUR negative 03/20/2020 1222   PROTEINUR NEGATIVE 02/15/2020 0403   UROBILINOGEN 0.2 03/20/2020 1222   NITRITE Negative 03/20/2020 1222   NITRITE NEGATIVE 02/15/2020 0403   LEUKOCYTESUR  Negative 03/20/2020 1222   LEUKOCYTESUR TRACE (A) 02/15/2020 0403   Recent Results (from the past 240 hour(s))  SARS Coronavirus 2 by RT PCR (hospital order, performed in [place] hospital lab) Nasopharyngeal Nasopharyngeal Swab     Status: None   Collection Time: 04/09/20 12:23 PM   Specimen: Nasopharyngeal Swab  Result Value Ref Range Status   SARS Coronavirus 2 NEGATIVE NEGATIVE Final    Comment: (NOTE) SARS-CoV-2 target nucleic acids are NOT DETECTED. The SARS-CoV-2 RNA is generally detectable in upper and lower respiratory specimens during the acute phase of infection. The lowest concentration of SARS-CoV-2 viral copies this assay can detect is 250 copies / mL. A negative result does not preclude SARS-CoV-2 infection and should not be used as the sole basis for treatment or other patient management decisions.  A negative result may occur with improper specimen collection / handling, submission of specimen other than nasopharyngeal swab, presence of viral mutation(s) within the areas targeted by this assay, and inadequate number of viral copies (<250 copies / mL).  A negative result must be combined with clinical observations, patient history, and epidemiological information. Fact Sheet for Patients:   StrictlyIdeas.no Fact Sheet for Healthcare Providers: BankingDealers.co.za This test is not yet approved or cleared  by the [place] FDA and has been authorized for detection and/or diagnosis of SARS-CoV-2 by FDA under an Emergency Use Authorization (EUA).  This EUA will remain in effect (meaning this test can be used) for the duration of the COVID-19 declaration under Section 564(b)(1) of the Act, 21 U.S.C. section 360bbb-3(b)(1), unless the authorization is terminated or revoked sooner. Performed at [facility], [address] [place], [place] [address]       Radiology Studies: DG Cholangiogram  Operative  Result Date: 04/11/2020 CLINICAL DATA:  Intraoperative cholangiogram during laparoscopic cholecystectomy. EXAM: INTRAOPERATIVE CHOLANGIOGRAM FLUOROSCOPY TIME:  23 seconds COMPARISON:  Nuclear medicine HIDA scan-04/10/2020; right upper quadrant abdominal ultrasound-04/09/2020; CT abdomen and pelvis-04/09/2020 FINDINGS: Intraoperative cholangiographic images of the right upper abdominal quadrant during laparoscopic cholecystectomy are provided for review. Surgical clips overlie the expected location of the gallbladder fossa. Contrast injection demonstrates selective cannulation of the central aspect of the cystic duct. There is passage of contrast through the central aspect of the cystic duct with filling of a non dilated common bile duct. There is passage of contrast though the CBD and into the descending portion of the duodenum. There is minimal reflux of injected contrast into the common hepatic duct and central aspect of the non dilated intrahepatic biliary system. There is a minimal amount of contrast extravasation about the cystic duct cannulation site. There is a persistent near occlusive filling defect in the distal aspect of the CBD worrisome for choledocholithiasis. IMPRESSION: Findings worrisome for near occlusive choledocholithiasis. For evaluation and management with ERCP could be performed as indicated. Electronically Signed   By: Sandi Mariscal M.D.   On: 04/11/2020 10:00   NM Hepatobiliary Liver Func  Result Date: 04/10/2020 CLINICAL DATA:  Abdominal and chest pain for 3 weeks, severe for 3 days, nausea, vomiting; abnormal ultrasound abdomen with gallstones, gallbladder wall thickening, and distended gallbladder EXAM: NUCLEAR MEDICINE HEPATOBILIARY IMAGING TECHNIQUE: Sequential images of the abdomen were obtained out to 60 minutes following intravenous administration of radiopharmaceutical. RADIOPHARMACEUTICALS:  5.0 mCi Tc-43m  Choletec IV COMPARISON:  Ultrasound abdomen 04/09/2020  FINDINGS: Prompt tracer extraction from bloodstream indicating normal hepatocellular function. Prompt excretion of tracer into biliary tree. Duodenum visualized at 9 minutes. At 1 hour, gallbladder had not visualized. Patient received 2 mg of morphine IV and imaging was continued for 30 minutes. Gallbladder failed to visualize following morphine augmentation. Findings are consistent with cystic duct obstruction and acute cholecystitis. IMPRESSION: Patent CBD. Nonvisualization of the gallbladder despite morphine administration consistent with cystic duct obstruction and acute cholecystitis. Electronically Signed   By: Lavonia Dana M.D.   On: 04/10/2020 16:57    Scheduled Meds: . [START ON 04/12/2020] enoxaparin (LOVENOX) injection  40 mg Subcutaneous Q24H  . levothyroxine  25 mcg Intravenous Daily  . pantoprazole (PROTONIX) IV  40 mg Intravenous Daily  . senna  1 tablet Oral BID  . sodium chloride flush  3 mL Intravenous Q12H   Continuous Infusions: . sodium chloride 75 mL/hr at 04/11/20 1215  . cefTRIAXone (ROCEPHIN)  IV 2 g (04/11/20 1216)   And  . metronidazole 500 mg (04/11/20 0424)  . lactated ringers 50 mL/hr at 04/11/20 0658     LOS: 2 days   Time spent: 25 minutes.  Patrecia Pour, MD [facility] [email or web address] 04/11/2020, 12:28 PM

## 2020-04-11 NOTE — Op Note (Signed)
04/09/2020 - 04/11/2020  9:26 AM  PATIENT:  Cynthia Young, 86 y.o., female, MRN: [id number]  PREOP DIAGNOSIS:  cholecystitis  POSTOP DIAGNOSIS:   Acute and chronic cholecystitis, cholelithiasis, choledocholithiasis  PROCEDURE:   Procedure(s):  LAPAROSCOPIC CHOLECYSTECTOMY WITH INTRAOPERATIVE CHOLANGIOGRAM  SURGEON:   Alphonsa Overall, M.D.  ASSISTANT:   Saverio Danker, P.A.  ANESTHESIA:   general  Anesthesiologist: Myrtie Soman, MD CRNA: Claudia Desanctis, CRNA; Niel Hummer, CRNA  General  ASA: 3 Emergency  EBL:  minimal  ml  BLOOD ADMINISTERED: none  DRAINS: none   LOCAL MEDICATIONS USED:   30 cc of 1/4% marcaine  SPECIMEN:   Gall bladder  COUNTS CORRECT:  YES  INDICATIONS FOR PROCEDURE:  [name] is a 86 y.o. (DOB: 1930-07-15) white female whose primary care physician is Mosie Lukes, MD and comes for cholecystectomy.   The indications and risks of the gall bladder surgery were explained to the patient.  The risks include, but are not limited to, infection, bleeding, common bile duct injury and open surgery.  SURGERY:  The patient was taken to OR [facility] at [facility].  The abdomen was prepped with chloroprep.  The patient was Rocephin and Flagyl prior to the beginning of the operation.   A time out was held and the surgical checklist run.   An infraumbilical incision was made into the abdominal cavity.  A 12 mm Hasson trocar was inserted into the abdominal cavity through the infraumbilical incision and secured with a 0 Vicryl suture.  Three additional trocars were inserted: a 10 mm trocar in the sub-xiphoid location, a 5 mm trocar in the right mid subcostal area, and a 5 mm trocar in the right lateral subcostal area.   The abdomen was explored and the liver, stomach, and bowel that could be seen were unremarkable.   The gall bladder was acutely and chronically inflamed with purulence on the gall bladder.  There were omental adhesions that  encased the upper 1/2 of the gall bladder.   I dissected off the omentum and grasped the gall bladder and rotated it cephalad.  Disssection was carried down to the gall bladder/cystic duct junction and the cystic duct isolated.  A clip was placed on the gall bladder side of the cystic duct.   An intra-operative cholangiogram was shot.   The cystic duct was noted to be markedly thickened, where even getting a 10 mm clip across it was difficult.   The intra-operative cholangiogram was shot using a cut off Taut catheter placed through a 14 gauge angiocath in the RUQ.  The Taut catheter was inserted in the cut cystic duct and secured with an endoclip.  A cholangiogram was shot twice with 15 cc of 1/2 strength Isoview.  Using fluoroscopy, the cholangiogram showed the flow of contrast into the common bile duct and up the hepatic radicals, but did not go into the duodenum and there appear to be at least 2 filling defects in the distal common bile duct.     The Taut catheter was removed.  The cystic duct was ligated with 2 PDS endoloops and the cystic artery was identified and clipped.  The gall bladder was bluntly and sharpley dissected from the gall bladder bed.   After the gall bladder was removed from the liver, the gall bladder bed and Triangle of [place] were inspected.  There was no bleeding or bile leak.  The gall bladder was placed in a Ecco Sac bag and delivered through the  umbilicus.  The abdomen was irrigated with 1,500 cc saline.   I did place Surgicel in the gall bladder bed.   I spent some time at the end of the operation looking at the colon and small small bowel since there was evidence of possible colitis on the CT scan.  I saw no evidence of this.   The trocars were then removed.  I infiltrated 30 cc of 1/4% Marcaine into the incisions.  The umbilical port closed with a 0 Vicryl suture and the skin closed with 4-0 Monocryl.  The skin was painted with DermaBond.  The patient's sponge and needle  count were correct.  The patient was transported to the RR in good condition.  Alphonsa Overall, MD, [facility] Pager: [phone] Office phone:  [phone]

## 2020-04-11 NOTE — Progress Notes (Signed)
OT Cancellation Note  Patient Details Name: Cynthia Young MRN: [id number] DOB: 03-14-1930   Cancelled Treatment:    Reason Eval/Treat Not Completed: Patient at procedure or test/ unavailable. Patient currently off floor for surgery. Will re-attempt as appropriate.   Delbert Phenix OT Pager: [phone] 04/11/2020, 7:15 AM

## 2020-04-12 ENCOUNTER — Encounter (HOSPITAL_COMMUNITY)
Admission: EM | Disposition: A | Discharge status: Home Health | Payer: Self-pay | Source: Home / Self Care | Attending: Family Medicine

## 2020-04-12 ENCOUNTER — Inpatient Hospital Stay (HOSPITAL_COMMUNITY): Payer: Medicare Other | Admitting: Certified Registered"

## 2020-04-12 ENCOUNTER — Inpatient Hospital Stay (HOSPITAL_COMMUNITY): Payer: Medicare Other

## 2020-04-12 DIAGNOSIS — K259 Gastric ulcer, unspecified as acute or chronic, without hemorrhage or perforation: Secondary | ICD-10-CM

## 2020-04-12 DIAGNOSIS — K2970 Gastritis, unspecified, without bleeding: Secondary | ICD-10-CM

## 2020-04-12 HISTORY — PX: ERCP: SHX5425

## 2020-04-12 HISTORY — PX: SPHINCTEROTOMY: SHX5544

## 2020-04-12 HISTORY — PX: REMOVAL OF STONES: SHX5545

## 2020-04-12 HISTORY — PX: BIOPSY: SHX5522

## 2020-04-12 LAB — CBC
HCT: 35.6 % — ABNORMAL LOW (ref 36.0–46.0)
Hemoglobin: 11.1 g/dL — ABNORMAL LOW (ref 12.0–15.0)
MCH: 29.8 pg (ref 26.0–34.0)
MCHC: 31.2 g/dL (ref 30.0–36.0)
MCV: 95.7 fL (ref 80.0–100.0)
Platelets: 218 10*3/uL (ref 150–400)
RBC: 3.72 MIL/uL — ABNORMAL LOW (ref 3.87–5.11)
RDW: 13.7 % (ref 11.5–15.5)
WBC: 11.9 10*3/uL — ABNORMAL HIGH (ref 4.0–10.5)
nRBC: 0 % (ref 0.0–0.2)

## 2020-04-12 LAB — COMPREHENSIVE METABOLIC PANEL
ALT: 88 U/L — ABNORMAL HIGH (ref 0–44)
AST: 110 U/L — ABNORMAL HIGH (ref 15–41)
Albumin: 2.5 g/dL — ABNORMAL LOW (ref 3.5–5.0)
Alkaline Phosphatase: 180 U/L — ABNORMAL HIGH (ref 38–126)
Anion gap: 7 (ref 5–15)
BUN: 11 mg/dL (ref 8–23)
CO2: 23 mmol/L (ref 22–32)
Calcium: 8.2 mg/dL — ABNORMAL LOW (ref 8.9–10.3)
Chloride: 105 mmol/L (ref 98–111)
Creatinine, Ser: 0.68 mg/dL (ref 0.44–1.00)
GFR calc Af Amer: >60 mL/min (ref >60)
GFR calc non Af Amer: >60 mL/min (ref >60)
Glucose, Bld: 132 mg/dL — ABNORMAL HIGH (ref 70–99)
Potassium: 4.3 mmol/L (ref 3.5–5.1)
Sodium: 135 mmol/L (ref 135–145)
Total Bilirubin: 0.8 mg/dL (ref 0.3–1.2)
Total Protein: 5.3 g/dL — ABNORMAL LOW (ref 6.5–8.1)

## 2020-04-12 SURGERY — ERCP, WITH INTERVENTION IF INDICATED
Anesthesia: General

## 2020-04-12 MED ORDER — ROCURONIUM BROMIDE 10 MG/ML (PF) SYRINGE
PREFILLED_SYRINGE | INTRAVENOUS | Status: DC | PRN
  Administered 2020-04-12: 20 mg via INTRAVENOUS

## 2020-04-12 MED ORDER — GLUCAGON HCL RDNA (DIAGNOSTIC) 1 MG IJ SOLR
INTRAMUSCULAR | Status: DC | PRN
  Administered 2020-04-12 (×2): .25 mg via INTRAVENOUS

## 2020-04-12 MED ORDER — PANTOPRAZOLE SODIUM 40 MG PO TBEC
40.0000 mg | DELAYED_RELEASE_TABLET | Freq: Two times a day (BID) | ORAL | Status: DC
  Administered 2020-04-12 – 2020-04-13 (×2): 40 mg via ORAL
  Filled 2020-04-12: qty 1

## 2020-04-12 MED ORDER — INDOMETHACIN 50 MG RE SUPP
RECTAL | Status: AC
  Filled 2020-04-12: qty 2

## 2020-04-12 MED ORDER — LACTATED RINGERS IV SOLN
INTRAVENOUS | Status: DC | PRN
Start: 2020-04-12 — End: 2020-04-12

## 2020-04-12 MED ORDER — PROPOFOL 10 MG/ML IV BOLUS
INTRAVENOUS | Status: DC | PRN
  Administered 2020-04-12: 100 mg via INTRAVENOUS

## 2020-04-12 MED ORDER — DEXTROSE 5 % IV SOLN
INTRAVENOUS | Status: DC | PRN
  Administered 2020-04-12: 2 g via INTRAVENOUS

## 2020-04-12 MED ORDER — LACTATED RINGERS IV SOLN: INTRAVENOUS | Status: AC

## 2020-04-12 MED ORDER — EPHEDRINE SULFATE-NACL 50-0.9 MG/10ML-% IV SOSY
PREFILLED_SYRINGE | INTRAVENOUS | Status: DC | PRN
  Administered 2020-04-12: 5 mg via INTRAVENOUS

## 2020-04-12 MED ORDER — DEXAMETHASONE SODIUM PHOSPHATE 10 MG/ML IJ SOLN
INTRAMUSCULAR | Status: DC | PRN
  Administered 2020-04-12: 8 mg via INTRAVENOUS

## 2020-04-12 MED ORDER — FENTANYL CITRATE (PF) 100 MCG/2ML IJ SOLN
INTRAMUSCULAR | Status: DC | PRN
  Administered 2020-04-12: 50 ug via INTRAVENOUS

## 2020-04-12 MED ORDER — SUCCINYLCHOLINE CHLORIDE 200 MG/10ML IV SOSY
PREFILLED_SYRINGE | INTRAVENOUS | Status: DC | PRN
  Administered 2020-04-12: 50 mg via INTRAVENOUS

## 2020-04-12 MED ORDER — FENTANYL CITRATE (PF) 100 MCG/2ML IJ SOLN
INTRAMUSCULAR | Status: AC
  Filled 2020-04-12: qty 2

## 2020-04-12 MED ORDER — ONDANSETRON HCL 4 MG/2ML IJ SOLN
INTRAMUSCULAR | Status: DC | PRN
  Administered 2020-04-12: 4 mg via INTRAVENOUS

## 2020-04-12 MED ORDER — SUGAMMADEX SODIUM 200 MG/2ML IV SOLN
INTRAVENOUS | Status: DC | PRN
  Administered 2020-04-12: 150 mg via INTRAVENOUS

## 2020-04-12 MED ORDER — GLUCAGON HCL RDNA (DIAGNOSTIC) 1 MG IJ SOLR
INTRAMUSCULAR | Status: AC
  Filled 2020-04-12: qty 1

## 2020-04-12 MED ORDER — LIDOCAINE 2% (20 MG/ML) 5 ML SYRINGE
INTRAMUSCULAR | Status: DC | PRN
  Administered 2020-04-12: 50 mg via INTRAVENOUS

## 2020-04-12 MED ORDER — INDOMETHACIN 50 MG RE SUPP
RECTAL | Status: DC | PRN
  Administered 2020-04-12: 100 mg via RECTAL

## 2020-04-12 MED ORDER — SODIUM CHLORIDE 0.9 % IV SOLN
INTRAVENOUS | Status: DC | PRN
  Administered 2020-04-12: 30 mL

## 2020-04-12 NOTE — Op Note (Signed)
[facility] Patient Name: Cynthia Young Procedure Date: 04/12/2020 MRN: [id number] Attending MD: Justice Britain , MD Date of Birth: [birth year] CSN: [id number] Age: 86 Admit Type: Ambulatory Procedure:                ERCP Indications:              Bile duct stone(s), Filling defect on                            intraoperative cholangiogram, Evaluation and                            possible treatment of bile duct stone(s), Elevated                            liver enzymes Providers:                Justice Britain, MD, Cleda Daub, RN, Corie Chiquito, Technician Referring MD:             [facility], [facility] Medicines:                General Anesthesia, Ceftriaxone 2 g, Indomethacin                            100 mg PR, Glucagon 0.5 mg IV Complications:            No immediate complications. Estimated Blood Loss:     Estimated blood loss was minimal. Procedure:                Pre-Anesthesia Assessment:                           - Prior to the procedure, a History and Physical                            was performed, and patient medications and                            allergies were reviewed. The patient's tolerance of                            previous anesthesia was also reviewed. The risks                            and benefits of the procedure and the sedation                            options and risks were discussed with the patient.                            All questions were answered, and informed consent  was obtained. Prior Anticoagulants: The patient has                            taken Lovenox (enoxaparin), last dose was 2 days                            prior to procedure. ASA Grade Assessment: III - A                            patient with severe systemic disease. After                            reviewing the risks and benefits, the patient was        deemed in satisfactory condition to undergo the                            procedure.                           After obtaining informed consent, the scope was                            passed under direct vision. Throughout the                            procedure, the patient's blood pressure, pulse, and                            oxygen saturations were monitored continuously. The                            TJF-Q180V ([id number]) Olympus Duodenoscope was                            introduced through the mouth, and used to inject                            contrast into and used to inject contrast into the                            bile duct. The ERCP was accomplished without                            difficulty. The patient tolerated the procedure. Scope In: Scope Out: Findings:      A scout film of the abdomen was obtained. Surgical clips, consistent       with a previous cholecystectomy, were seen in the area of the right       upper quadrant of the abdomen.      The upper GI tract was traversed under direct vision without detailed       examination. Patchy moderate inflammation characterized by congestion       (edema), erythema and granularity was found in the cardia, in the       gastric fundus, in the gastric body, at  the incisura and in the gastric       antrum. One non-bleeding cratered gastric ulcer with a clean ulcer base       (Forrest Class III) was found on the lesser curvature of the stomach.       The lesion was 10 mm in largest dimension. Localized mildly erythematous       mucosa without active bleeding and with no stigmata of bleeding was       found in the duodenal bulb. Biopsies were taken in the cardia, in the       gastric body, at the incisura and in the gastric antrum through the ERCP       scope with the cold forceps for H. pylori evaluation and for histology.       The major papilla was small.      A short 0.035 inch Soft Jagwire was passed into the  biliary tree but was       preferential for the cystic duct initially. After manipulating the       sphincterotome, the wire was passed into the bile duct. The Autotome       sphincterotome was passed over the guidewire and the bile duct was then       deeply cannulated. Contrast was injected. I personally interpreted the       bile duct images. Ductal flow of contrast was adequate. Image quality       was adequate. Contrast extended to the hepatic ducts. Opacification of       the entire biliary tree except for the cystic duct and gallbladder was       successful. The middle third of the main bile duct and upper third of       the main bile duct were mildly dilated. The largest diameter was 10 mm.       The middle third of the main bile duct contained a filling defect       thought to be a stone. An 8 mm biliary sphincterotomy was made with a       monofilament Autotome sphincterotome using ERBE electrocautery. There       was no post-sphincterotomy bleeding. To discover objects, the biliary       tree was swept with a retrieval balloon starting at the bifurcation.       Sludge was swept from the duct. One black pigmented stone was removed.       No stones remained. An occlusion cholangiogram was performed that showed       no further significant biliary pathology.      A pancreatogram was not performed.      The duodenoscope was withdrawn from the patient. Impression:               - Gastritis. Non-bleeding gastric ulcer with a                            clean ulcer base (Forrest Class III). Biopsied for                            H. pylori evaluation.                           - Erythematous duodenopathy.                           -  The major papilla appeared to be small.                           - A filling defect consistent with a stone was seen                            on the cholangiogram.                           - The upper third of the main bile duct and middle                             third of the main bile duct were mildly dilated.                           - Choledocholithiasis was found. Complete removal                            was accomplished by biliary sphincterotomy and                            balloon sweep. Moderate Sedation:      Not Applicable - Patient had care per Anesthesia. Recommendation:           - The patient will be observed post-procedure,                            until all discharge criteria are met.                           - Return patient to hospital ward for ongoing care.                           - Advance diet as tolerated.                           - Observe patient's clinical course.                           - Check liver enzymes (AST, ALT, alkaline                            phosphatase, bilirubin) in the morning.                           - Watch for pancreatitis, bleeding, perforation,                            and cholangitis.                           - Would hold chemical VTE prophylaxis for 48 hours                            to decrease risk of post-sphincterotomy bleeding.  If anticoagulation is necessary consider heparin                            gtt without bolus in 6-hours and monitor closely.                           - Await path results.                           - PO Protonix or Omeprazole 40 mg BID x 69-month.                            Then decrease to once daily and maintain.                           - Repeat EGD in 2-3 months to check on healing of                            gastric ulceration/gastritis/duodenitis.                           - The findings and recommendations were discussed                            with the patient.                           - The findings and recommendations were discussed                            with the patient's family. Procedure Code(s):        --- Professional ---                           [id number] Endoscopic retrograde                             cholangiopancreatography (ERCP); with removal of                            calculi/debris from biliary/pancreatic duct(s)                           43262, Endoscopic retrograde                            cholangiopancreatography (ERCP); with                            sphincterotomy/papillotomy                           43261, Endoscopic retrograde                            cholangiopancreatography (ERCP); with biopsy,  single or multiple Diagnosis Code(s):        --- Professional ---                           K29.70, Gastritis, unspecified, without bleeding                           K25.9, Gastric ulcer, unspecified as acute or                            chronic, without hemorrhage or perforation                           K31.89, Other diseases of stomach and duodenum                           K83.8, Other specified diseases of biliary tract                           R93.2, Abnormal findings on diagnostic imaging of                            liver and biliary tract                           K80.50, Calculus of bile duct without cholangitis                            or cholecystitis without obstruction                           R74.8, Abnormal levels of other serum enzymes CPT copyright 2019 [employer]. All rights reserved. The codes documented in this report are preliminary and upon coder review may  be revised to meet current compliance requirements. Justice Britain, MD 04/12/2020 1:11:51 PM Number of Addenda: 0

## 2020-04-12 NOTE — Progress Notes (Signed)
OT Cancellation Note  Patient Details Name: Cynthia Young MRN: [id number] DOB: [birth year]   Cancelled Treatment:    Reason Eval/Treat Not Completed: Patient at procedure or test/ unavailable(surgery)  Dushawn Pusey,HILLARY 04/12/2020, 1:14 PM  Maurie Boettcher, OT/L   Acute OT Clinical Specialist Acute Rehabilitation Services Pager [phone] Office [phone]

## 2020-04-12 NOTE — Progress Notes (Signed)
PT Cancellation Note  Patient Details Name: Cynthia Young MRN: [id number] DOB: 1930-01-25   Cancelled Treatment:    Reason Eval/Treat Not Completed: Patient at procedure or test/unavailable  Pt off floor for ERCP will f/u as able. Maggie Font, PT Acute Rehab Services Pager [phone] [facility] [phone] [facility] [place] 04/12/2020, 1:34 PM

## 2020-04-12 NOTE — Progress Notes (Signed)
Subjective No acute events. Incisional soreness. Denies n/v per se. Has been NPO for planned ERCP today  Objective: Vital signs in last 24 hours: Temp:  [97.5 F (36.4 C)-98.4 F (36.9 C)] 98.1 F (36.7 C) (05/29 0535) Pulse Rate:  [60-96] 60 (05/29 0535) Resp:  [13-22] 18 (05/29 0535) BP: (125-151)/(58-88) 137/69 (05/29 0535) SpO2:  [92 %-99 %] 93 % (05/29 0535) Last BM Date: 04/08/20  Intake/Output from previous day: 05/28 0701 - 05/29 0700 In: 3118.5 [P.O.:1560; I.V.:1358.5; IV Piggyback:200] Out: 5 [Blood:5] Intake/Output this shift: No intake/output data recorded.  Gen: NAD, comfortable CV: RRR Pulm: Normal work of breathing Abd: Soft, mildly ttp around incisions. Not significantly distended Ext: SCDs in place  Lab Results: CBC  Recent Labs    04/11/20 0420 04/12/20 0419  WBC 14.8* 11.9*  HGB 11.6* 11.1*  HCT 36.8 35.6*  PLT 225 218   BMET Recent Labs    04/11/20 0420 04/12/20 0419  NA 140 135  K 4.2 4.3  CL 111 105  CO2 21* 23  GLUCOSE 87 132*  BUN 14 11  CREATININE 0.84 0.68  CALCIUM 8.7* 8.2*   PT/INR No results for input(s): LABPROT, INR in the last 72 hours. ABG No results for input(s): PHART, HCO3 in the last 72 hours.  Invalid input(s): PCO2, PO2  Studies/Results:  Anti-infectives: Anti-infectives (From admission, onward)   Start     Dose/Rate Route Frequency Ordered Stop   04/10/20 1200  cefTRIAXone (ROCEPHIN) 2 g in sodium chloride 0.9 % 100 mL IVPB     2 g 200 mL/hr over 30 Minutes Intravenous Every 24 hours 04/09/20 1609     04/09/20 2000  metroNIDAZOLE (FLAGYL) IVPB 500 mg     500 mg 100 mL/hr over 60 Minutes Intravenous Every 8 hours 04/09/20 1609     04/09/20 1130  cefTRIAXone (ROCEPHIN) 2 g in sodium chloride 0.9 % 100 mL IVPB     2 g 200 mL/hr over 30 Minutes Intravenous  Once 04/09/20 1120 04/09/20 1215   04/09/20 1130  metroNIDAZOLE (FLAGYL) IVPB 500 mg     500 mg 100 mL/hr over 60 Minutes Intravenous  Once 04/09/20  1120 04/09/20 1339       Assessment/Plan: Patient Active Problem List   Diagnosis Date Noted  . Acute cholecystitis 04/09/2020  . Pharyngitis 02/10/2020  . Varicose veins of both lower extremities 08/03/2019  . Hiatal hernia with GERD 03/16/2019  . Myalgia 09/25/2018  . Preventative health care 03/30/2018  . Hypothyroid   . Hyperlipidemia    Cynthia Young is a pleasant 45yoF whom is now s/p laparoscopic cholecystectomy with IOC 04/12/20 for acute cholecystitis - found to have cbd stones on IOC. No evident diverticulitis intraoperatively so no transmural process suspected - could be colitis based on CT?  -Recovering appropriately -Planning ERCP today with GI -Diet/dvt prophylaxis as per GI   LOS: 3 days   Sharon Mt. Dema Severin, M.D. [facility]

## 2020-04-12 NOTE — Anesthesia Procedure Notes (Signed)

## 2020-04-12 NOTE — Progress Notes (Signed)
PROGRESS NOTE  Cynthia Young  QBH:[id number] DOB: 1929/12/10 DOA: 04/09/2020 PCP: Mosie Lukes, MD   Brief Narrative: Cynthia Young is an 86 y.o. female with a history of hiatal hernia w/GERD on prn PPI and hypothyroidism on synthroid who presented to the ED 5/26 with abdominal cramping, loose stools for about 4-5 weeks which had gotten progressively worse, sometimes associated with food intake. Evaluation with labs revealed leukocytosis to 12.7k, LFTs wnl. CT abd/pelvis showed pericholecystic fluid, intrahepatic biliary ductal dilatation, hepatic steatosis, and mid-sigmoid colitis vs. early diverticulitis. RUQ U/S demonstrated cholelithiasis with gallbladder wall thickening and distention with 80m proximal CBD. [facility] 5/27 showing acute cholecystitis, and subsequently had laparoscopic cholecystectomy by Dr. NLucia Gaskins5/28/2021. During the procedure acute and purulent discharge was noted and intraoperative cholangiogram showed filling defects in the common bile duct with contrast not extending to small bowel. GI has been consulted for ERCP which is planned 5/29.  Assessment & Plan: Active Problems:   Acute cholecystitis  Acute on chronic cholecystitis: Confirmed on HIDA scan with acute and chronic appearing inflammation at time of lap chole with omental adhesions.  - s/p lap cholecystectomy by Dr. NLucia Gaskins5/28/2021.  - Continuing antibiotics as GB had purulence. Receptive to general surgery recommendations in this regard.  - Continue analgesics, antiemetics. Low rate IVF with LR to continue pending diet advancement.  Choledocholithiasis: +IOC during lap cholecystectomy, ERCP found stone removed by sphincterotomy and balloon sweep. - Monitor LFTs in AM (were elevated today) - Monitor for pancreatitis  Gastritic ulcer, duodenitis:  - Monitor gastric biopsy results for H. pylori - Initiate protonix or omeprazole 468mpo BID x3 months,  then once daily.  - Will need repeat EGD in 2-3 months for surveillance  Sigmoid colitis vs. early diverticulitis: Unclear whether this is a significant finding radiographically. No gross evidence of this when viewed laparoscopically. - Continue bowel rest, IVF and abx as above.   Hypothyroidism: Recent TSH 1.48 - Continue synthroid (1/2 dose by IV while NPO)  1st degree AVB, 2nd degree AV block Mobitz 1 (Wenkebach): Noted on telemetry with intermittently bradycardic rate.  - Avoid AV nodal agents.  - Recommend cardiology consultation as outpatient. If becomes symptomatic or changes to type II, would need cardiology consultation during hospitalization.  Elevated BP: Likely related to pain, resolved.   DVT prophylaxis: Lovenox (holding 48 hours post ERCP) Code Status: Full Family Communication: Daughter at bedside Disposition Plan:  Status is: Inpatient  Remains inpatient appropriate because:Ongoing active pain requiring inpatient pain management, Ongoing diagnostic testing needed not appropriate for outpatient work up and IV treatments appropriate due to intensity of illness or inability to take PO   Dispo: The patient is from: Home              Anticipated d/c is to: TBD, PT/OT to be consulted starting postoperatively              Anticipated d/c date is: 1 day              Patient currently is not medically stable to d/c.  Consultants:   General surgery  Gastroenterology  Procedures:   Laparoscopic cholecystectomy 04/11/2020 Dr. NeLucia Gaskins ERCP 04/12/2020 Dr. MaRush Landmark Impression:               - Gastritis. Non-bleeding gastric ulcer with a  clean ulcer base (Forrest Class III). Biopsied for                            H. pylori evaluation.                           - Erythematous duodenopathy.                           - The major papilla appeared to be small.                           - A filling defect consistent with a stone was seen                             on the cholangiogram.                           - The upper third of the main bile duct and middle                            third of the main bile duct were mildly dilated.                           - Choledocholithiasis was found. Complete removal                            was accomplished by biliary sphincterotomy and                            balloon sweep. Recommendation:           - The patient will be observed post-procedure,                            until all discharge criteria are met.                           - Return patient to hospital ward for ongoing care.                           - Advance diet as tolerated.                           - Observe patient's clinical course.                           - Check liver enzymes (AST, ALT, alkaline                            phosphatase, bilirubin) in the morning.                           - Watch for pancreatitis, bleeding, perforation,  and cholangitis.                           - Would hold chemical VTE prophylaxis for 48 hours                            to decrease risk of post-sphincterotomy bleeding.                            If anticoagulation is necessary consider heparin                            gtt without bolus in 6-hours and monitor closely.                           - Await path results.                           - PO Protonix or Omeprazole 40 mg BID x 54-month.                            Then decrease to once daily and maintain.                           - Repeat EGD in 2-3 months to check on healing of                            gastric ulceration/gastritis/duodenitis.                           - The findings and recommendations were discussed                            with the patient.                           - The findings and recommendations were discussed                            with the patient's family.  Antimicrobials:  Ceftriaxone, flagyl 5/26 >>     Subjective: No events overnight, eager for surgery, will restart diet afterward. No fevers, abdominal pain is stable, constant, generalized.  Objective: Vitals:   04/12/20 1304 04/12/20 1310 04/12/20 1320 04/12/20 1330  BP: (!) 165/70 (!) 170/64 (!) 175/74 (!) 159/68  Pulse: 68 66 64 63  Resp: (!) 2 19 14 18   Temp: 97.9 F (36.6 C)     TempSrc: Oral     SpO2: 100% 100% 100% 93%  Weight:      Height:        Intake/Output Summary (Last 24 hours) at 04/12/2020 1342 Last data filed at 04/12/2020 1303 Gross per 24 hour  Intake 1778.48 ml  Output 200 ml  Net 1578.48 ml   Filed Weights   04/09/20 0853 04/12/20 1151  Weight: 54.4 kg 54 kg   Gen: Pleasant, elderly female in no distress Pulm: Nonlabored breathing room air. Clear. CV: Regular rate and rhythm. Soft  systolic murmur, rub, or gallop. No JVD, no dependent edema. GI: Abdomen soft, diffusely tender, non-distended, with normoactive bowel sounds.  Ext: Warm, no deformities Skin: No new rashes, lesions or ulcers on visualized skin. Neuro: Alert and oriented. No focal neurological deficits. Psych: Judgement and insight appear fair. Mood euthymic & affect congruent. Behavior is appropriate.    Data Reviewed: I have personally reviewed following labs and imaging studies  CBC: Recent Labs  Lab 04/09/20 0927 04/10/20 0428 04/11/20 0420 04/12/20 0419  WBC 12.7* 14.9* 14.8* 11.9*  NEUTROABS 11.4*  --   --   --   HGB 14.0 12.7 11.6* 11.1*  HCT 42.6 40.5 36.8 35.6*  MCV 92.8 96.0 96.6 95.7  PLT 250 226 225 338   Basic Metabolic Panel: Recent Labs  Lab 04/09/20 0927 04/10/20 0428 04/11/20 0420 04/12/20 0419  NA 138 136 140 135  K 4.2 3.7 4.2 4.3  CL 102 102 111 105  CO2 24 20* 21* 23  GLUCOSE 159* 115* 87 132*  BUN 13 11 14 11   CREATININE 0.73 0.76 0.84 0.68  CALCIUM 9.2 8.4* 8.7* 8.2*   GFR: Estimated Creatinine Clearance: 36.4 mL/min (by C-G formula based on SCr of 0.68 mg/dL). Liver Function  Tests: Recent Labs  Lab 04/09/20 0927 04/11/20 0420 04/12/20 0419  AST 18 12* 110*  ALT 13 9 88*  ALKPHOS 59 45 180*  BILITOT 0.7 0.7 0.8  PROT 7.1 5.6* 5.3*  ALBUMIN 3.9 2.9* 2.5*   Recent Labs  Lab 04/09/20 0927  LIPASE 19   Urine analysis:    Component Value Date/Time   COLORURINE YELLOW 02/15/2020 0403   APPEARANCEUR CLEAR 02/15/2020 0403   LABSPEC 1.015 02/15/2020 0403   PHURINE 6.0 02/15/2020 0403   GLUCOSEU NEGATIVE 02/15/2020 0403   HGBUR NEGATIVE 02/15/2020 0403   BILIRUBINUR negative 03/20/2020 1222   KETONESUR negative 03/20/2020 1222   KETONESUR NEGATIVE 02/15/2020 0403   PROTEINUR negative 03/20/2020 1222   PROTEINUR NEGATIVE 02/15/2020 0403   UROBILINOGEN 0.2 03/20/2020 1222   NITRITE Negative 03/20/2020 1222   NITRITE NEGATIVE 02/15/2020 0403   LEUKOCYTESUR Negative 03/20/2020 1222   LEUKOCYTESUR TRACE (A) 02/15/2020 0403   Recent Results (from the past 240 hour(s))  SARS Coronavirus 2 by RT PCR (hospital order, performed in [facility] hospital lab) Nasopharyngeal Nasopharyngeal Swab     Status: None   Collection Time: 04/09/20 12:23 PM   Specimen: Nasopharyngeal Swab  Result Value Ref Range Status   SARS Coronavirus 2 NEGATIVE NEGATIVE Final    Comment: (NOTE) SARS-CoV-2 target nucleic acids are NOT DETECTED. The SARS-CoV-2 RNA is generally detectable in upper and lower respiratory specimens during the acute phase of infection. The lowest concentration of SARS-CoV-2 viral copies this assay can detect is 250 copies / mL. A negative result does not preclude SARS-CoV-2 infection and should not be used as the sole basis for treatment or other patient management decisions.  A negative result may occur with improper specimen collection / handling, submission of specimen other than nasopharyngeal swab, presence of viral mutation(s) within the areas targeted by this assay, and inadequate number of viral copies (<250 copies / mL). A negative result must  be combined with clinical observations, patient history, and epidemiological information. Fact Sheet for Patients:   StrictlyIdeas.no Fact Sheet for Healthcare Providers: BankingDealers.co.za This test is not yet approved or cleared  by the [place] FDA and has been authorized for detection and/or diagnosis of SARS-CoV-2 by FDA under an Emergency Use Authorization (EUA).  This EUA will remain in effect (meaning this test can be used) for the duration of the COVID-19 declaration under Section 564(b)(1) of the Act, 21 U.S.C. section 360bbb-3(b)(1), unless the authorization is terminated or revoked sooner. Performed at [facility], [address] [place], [place] [address]       Radiology Studies: DG Cholangiogram Operative  Result Date: 04/11/2020 CLINICAL DATA:  Intraoperative cholangiogram during laparoscopic cholecystectomy. EXAM: INTRAOPERATIVE CHOLANGIOGRAM FLUOROSCOPY TIME:  23 seconds COMPARISON:  Nuclear medicine HIDA scan-04/10/2020; right upper quadrant abdominal ultrasound-04/09/2020; CT abdomen and pelvis-04/09/2020 FINDINGS: Intraoperative cholangiographic images of the right upper abdominal quadrant during laparoscopic cholecystectomy are provided for review. Surgical clips overlie the expected location of the gallbladder fossa. Contrast injection demonstrates selective cannulation of the central aspect of the cystic duct. There is passage of contrast through the central aspect of the cystic duct with filling of a non dilated common bile duct. There is passage of contrast though the CBD and into the descending portion of the duodenum. There is minimal reflux of injected contrast into the common hepatic duct and central aspect of the non dilated intrahepatic biliary system. There is a minimal amount of contrast extravasation about the cystic duct cannulation site. There is a persistent near occlusive filling defect in the  distal aspect of the CBD worrisome for choledocholithiasis. IMPRESSION: Findings worrisome for near occlusive choledocholithiasis. For evaluation and management with ERCP could be performed as indicated. Electronically Signed   By: Sandi Mariscal M.D.   On: 04/11/2020 10:00   NM Hepatobiliary Liver Func  Result Date: 04/10/2020 CLINICAL DATA:  Abdominal and chest pain for 3 weeks, severe for 3 days, nausea, vomiting; abnormal ultrasound abdomen with gallstones, gallbladder wall thickening, and distended gallbladder EXAM: NUCLEAR MEDICINE HEPATOBILIARY IMAGING TECHNIQUE: Sequential images of the abdomen were obtained out to 60 minutes following intravenous administration of radiopharmaceutical. RADIOPHARMACEUTICALS:  5.0 mCi Tc-74m Choletec IV COMPARISON:  Ultrasound abdomen 04/09/2020 FINDINGS: Prompt tracer extraction from bloodstream indicating normal hepatocellular function. Prompt excretion of tracer into biliary tree. Duodenum visualized at 9 minutes. At 1 hour, gallbladder had not visualized. Patient received 2 mg of morphine IV and imaging was continued for 30 minutes. Gallbladder failed to visualize following morphine augmentation. Findings are consistent with cystic duct obstruction and acute cholecystitis. IMPRESSION: Patent CBD. Nonvisualization of the gallbladder despite morphine administration consistent with cystic duct obstruction and acute cholecystitis. Electronically Signed   By: MLavonia DanaM.D.   On: 04/10/2020 16:57    Scheduled Meds:  levothyroxine  25 mcg Intravenous Daily   pantoprazole  40 mg Oral BID AC   senna  1 tablet Oral BID   sodium chloride flush  3 mL Intravenous Q12H   Continuous Infusions:  cefTRIAXone (ROCEPHIN)  IV 2 g (04/11/20 1216)   And   metronidazole 500 mg (04/12/20 0329)   lactated ringers 50 mL/hr at 04/11/20 1307     LOS: 3 days   Time spent: 25 minutes.  RPatrecia Pour MD [facility] [email or web address] 04/12/2020, 1:42 PM

## 2020-04-12 NOTE — Interval H&P Note (Signed)
History and Physical Interval Note:  04/12/2020 11:21 AM  Cynthia Young  has presented today for surgery, with the diagnosis of Choledocholithiasis.  The various methods of treatment have been discussed with the patient and family. After consideration of risks, benefits and other options for treatment, the patient has consented to  Procedure(s): ENDOSCOPIC RETROGRADE CHOLANGIOPANCREATOGRAPHY (ERCP) (N/A) as a surgical intervention.  The patient's history has been reviewed, patient examined, no change in status, stable for surgery.  I have reviewed the patient's chart and labs.  Questions were answered to the patient's satisfaction.     [employer]

## 2020-04-12 NOTE — Anesthesia Preprocedure Evaluation (Addendum)
Anesthesia Evaluation  Patient identified by MRN, date of birth, ID band Patient awake    Reviewed: Allergy & Precautions, NPO status , Patient's Chart, lab work & pertinent test results  Airway Mallampati: II  TM Distance: >3 FB Neck ROM: Full    Dental  (+) Teeth Intact, Dental Advisory Given   Pulmonary neg pulmonary ROS,    breath sounds clear to auscultation       Cardiovascular negative cardio ROS   Rhythm:Regular Rate:Normal     Neuro/Psych  Neuromuscular disease negative psych ROS   GI/Hepatic Neg liver ROS, GERD  Medicated,  Endo/Other  Hypothyroidism   Renal/GU negative Renal ROS     Musculoskeletal  (+) Arthritis ,   Abdominal Normal abdominal exam  (+)   Peds  Hematology negative hematology ROS (+)   Anesthesia Other Findings   Reproductive/Obstetrics                            Anesthesia Physical Anesthesia Plan  ASA: III  Anesthesia Plan: General   Post-op Pain Management:    Induction: Intravenous  PONV Risk Score and Plan: 4 or greater and Ondansetron and Treatment may vary due to age or medical condition  Airway Management Planned: Oral ETT  Additional Equipment: None  Intra-op Plan:   Post-operative Plan: Extubation in OR  Informed Consent: I have reviewed the patients History and Physical, chart, labs and discussed the procedure including the risks, benefits and alternatives for the proposed anesthesia with the patient or authorized representative who has indicated his/her understanding and acceptance.     Dental advisory given  Plan Discussed with: CRNA  Anesthesia Plan Comments: (EKG: sinus bradycardia, PAC's noted.)       Anesthesia Quick Evaluation

## 2020-04-12 NOTE — Transfer of Care (Signed)
Immediate Anesthesia Transfer of Care Note  Patient: Cynthia Young  Procedure(s) Performed: ENDOSCOPIC RETROGRADE CHOLANGIOPANCREATOGRAPHY (ERCP) (N/A )  Patient Location: PACU and Endoscopy Unit  Anesthesia Type:General  Level of Consciousness: awake, alert , oriented and patient cooperative  Airway & Oxygen Therapy: Patient Spontanous Breathing and Patient connected to face mask oxygen  Post-op Assessment: Report given to RN, Post -op Vital signs reviewed and stable and Patient moving all extremities  Post vital signs: Reviewed and stable  Last Vitals:  Vitals Value Taken Time  BP    Temp    Pulse 70 04/12/20 1303  Resp 17 04/12/20 1303  SpO2 100 % 04/12/20 1303  Vitals shown include unvalidated device data.  Last Pain:  Vitals:   04/12/20 1151  TempSrc: Oral  PainSc: 0-No pain      Patients Stated Pain Goal: 2 ([id number])  Complications: No apparent anesthesia complications

## 2020-04-13 DIAGNOSIS — R933 Abnormal findings on diagnostic imaging of other parts of digestive tract: Secondary | ICD-10-CM

## 2020-04-13 DIAGNOSIS — K8043 Calculus of bile duct with acute cholecystitis with obstruction: Secondary | ICD-10-CM

## 2020-04-13 DIAGNOSIS — R945 Abnormal results of liver function studies: Secondary | ICD-10-CM

## 2020-04-13 LAB — COMPREHENSIVE METABOLIC PANEL
ALT: 81 U/L — ABNORMAL HIGH (ref 0–44)
AST: 77 U/L — ABNORMAL HIGH (ref 15–41)
Albumin: 2.5 g/dL — ABNORMAL LOW (ref 3.5–5.0)
Alkaline Phosphatase: 184 U/L — ABNORMAL HIGH (ref 38–126)
Anion gap: 7 (ref 5–15)
BUN: 15 mg/dL (ref 8–23)
CO2: 26 mmol/L (ref 22–32)
Calcium: 8.2 mg/dL — ABNORMAL LOW (ref 8.9–10.3)
Chloride: 108 mmol/L (ref 98–111)
Creatinine, Ser: 0.67 mg/dL (ref 0.44–1.00)
GFR calc Af Amer: >60 mL/min (ref >60)
GFR calc non Af Amer: >60 mL/min (ref >60)
Glucose, Bld: 135 mg/dL — ABNORMAL HIGH (ref 70–99)
Potassium: 4.1 mmol/L (ref 3.5–5.1)
Sodium: 141 mmol/L (ref 135–145)
Total Bilirubin: 0.9 mg/dL (ref 0.3–1.2)
Total Protein: 5.5 g/dL — ABNORMAL LOW (ref 6.5–8.1)

## 2020-04-13 LAB — CBC
HCT: 34.2 % — ABNORMAL LOW (ref 36.0–46.0)
Hemoglobin: 11 g/dL — ABNORMAL LOW (ref 12.0–15.0)
MCH: 30.1 pg (ref 26.0–34.0)
MCHC: 32.2 g/dL (ref 30.0–36.0)
MCV: 93.7 fL (ref 80.0–100.0)
Platelets: 253 10*3/uL (ref 150–400)
RBC: 3.65 MIL/uL — ABNORMAL LOW (ref 3.87–5.11)
RDW: 13.4 % (ref 11.5–15.5)
WBC: 8.4 10*3/uL (ref 4.0–10.5)
nRBC: 0 % (ref 0.0–0.2)

## 2020-04-13 MED ORDER — OMEPRAZOLE 40 MG PO CPDR: 40.0000 mg | DELAYED_RELEASE_CAPSULE | Freq: Every day | ORAL | 2 refills | Status: DC

## 2020-04-13 NOTE — TOC Progression Note (Signed)
Transition of Care [facility]) - Progression Note    Patient Details  Name: Cynthia Young MRN: [id number] Date of Birth: 1930/09/11  Transition [facility] CM/SW Contact  Joaquin Courts, RN Phone Number: 04/13/2020, 1:42 PM  Clinical Narrative:    CM spoke with patient and her daughter.  Patient declines [place] services at this time.  [employer] to deliver cane to bedside for home use.   Expected Discharge Plan: [place] Barriers to Discharge: No Barriers Identified  Expected Discharge Plan and Services Expected Discharge Plan: [place]   Discharge Planning Services: CM Consult Post Acute Care Choice: [place] arrangements for the past 2 months: Single Family Home Expected Discharge Date: 04/13/20               DME Arranged: Kasandra Knudsen DME Agency: [employer] Date DME Agency Contacted: 04/13/20 Time DME Agency Contacted: [phone] Representative spoke with at DME Agency: [place]: Refused [place]           Social Determinants of Health ([place]) Interventions    Readmission Risk Interventions No flowsheet data found.

## 2020-04-13 NOTE — Evaluation (Signed)
Occupational Therapy Evaluation Patient Details Name: Cynthia Young MRN: [id number] DOB: 30-Sep-1930 Today's Date: 04/13/2020    History of Present Illness 86 y.o. female with a history of hiatal hernia, GERD, hypothyroidism who presented to the ED 5/26 with abdominal cramping, loose stools.  s/p Laparoscopic cholecystectomy 04/11/2020 Dr. Lucia Gaskins. s/p ERCP 5/29   Clinical Impression   Patient with functional deficits listed below impacting safety/independence with self care. Supervision level for functional transfers and ADLs with min cues for safety with body mechanics. Patient furniture cruises from chair to bathroom and back. Will continue to follow.    Follow Up Recommendations  No OT follow up    Equipment Recommendations  None recommended by OT       Precautions / Restrictions Precautions Precautions: Fall Restrictions Weight Bearing Restrictions: No      Mobility Bed Mobility Overal bed mobility: Modified Independent             General bed mobility comments: NT--pt in chair on arrival  Transfers Overall transfer level: Needs assistance Equipment used: None Transfers: Sit to/from Stand Sit to Stand: Supervision         General transfer comment: for safety    Balance Overall balance assessment: Needs assistance Sitting-balance support: No upper extremity supported;Feet supported Sitting balance-Leahy Scale: Good     Standing balance support: Single extremity supported;No upper extremity supported Standing balance-Leahy Scale: Fair Standing balance comment: requires unilateral UE support for balance during dynamic tasks, holds onto furniture                           ADL either performed or assessed with clinical judgement   ADL Overall ADL's : Needs assistance/impaired     Grooming: Wash/dry hands;Supervision/safety;Standing   Upper Body Bathing: Set up;Sitting   Lower Body Bathing: Supervison/ safety;Sit to/from stand   Upper Body  Dressing : Set up;Sitting   Lower Body Dressing: Supervision/safety;Sit to/from stand Lower Body Dressing Details (indicate cue type and reason): to don clean underwear Toilet Transfer: Supervision/safety;Ambulation;Regular Toilet;Grab bars Toilet Transfer Details (indicate cue type and reason): min cues for safety with body mechanics Toileting- Clothing Manipulation and Hygiene: Supervision/safety;Sit to/from stand       Functional mobility during ADLs: Supervision/safety;Cueing for safety General ADL Comments: patient is close to baseline, reports feeling a little whoozy "from the pain medication" supervision for safety                   Pertinent Vitals/Pain Pain Assessment: 0-10 Pain Score: 7  Pain Location: abdomen Pain Descriptors / Indicators: Discomfort Pain Intervention(s): Monitored during session     Hand Dominance Right   Extremity/Trunk Assessment Upper Extremity Assessment Upper Extremity Assessment: Overall WFL for tasks assessed   Lower Extremity Assessment Lower Extremity Assessment: Defer to PT evaluation RLE Deficits / Details: grossly 3+/5 bil LEs RLE Sensation: history of peripheral neuropathy(per pt bil foot numbness at baseline) LLE Sensation: history of peripheral neuropathy       Communication Communication Communication: No difficulties   Cognition Arousal/Alertness: Awake/alert Behavior During Therapy: WFL for tasks assessed/performed Overall Cognitive Status: Within Functional Limits for tasks assessed                                                Home Living Family/patient expects to be discharged to:: Private residence Living  Arrangements: Other (Comment)(grandson living with her short term) Available Help at Discharge: Family;Other (Comment)(2 dtrs live nearby) Type of Home: Apartment Home Access: Level entry     Home Layout: One level     Bathroom Shower/Tub: Occupational psychologist: Standard      Home Equipment: None   Additional Comments: still drives, does own grocery shopping      Prior Functioning/Environment Level of Independence: Independent                 OT Problem List: Decreased activity tolerance;Impaired balance (sitting and/or standing);Pain;Decreased safety awareness      OT Treatment/Interventions: Self-care/ADL training;Therapeutic exercise;Energy conservation;DME and/or AE instruction;Therapeutic activities;Patient/family education;Balance training    OT Goals(Current goals can be found in the care plan section) Acute Rehab OT Goals Patient Stated Goal: to feel better OT Goal Formulation: With patient Time For Goal Achievement: 04/27/20 Potential to Achieve Goals: Good  OT Frequency: Min 2X/week    AM-PAC OT "6 Clicks" Daily Activity     Outcome Measure Help from another person eating meals?: None Help from another person taking care of personal grooming?: A Little Help from another person toileting, which includes using toliet, bedpan, or urinal?: A Little Help from another person bathing (including washing, rinsing, drying)?: A Little Help from another person to put on and taking off regular upper body clothing?: A Little Help from another person to put on and taking off regular lower body clothing?: A Little 6 Click Score: 19   End of Session  Activity Tolerance: Patient tolerated treatment well Patient left: in chair;with call bell/phone within reach  OT Visit Diagnosis: Other abnormalities of gait and mobility (R26.89);Pain Pain - part of body: (abdomen)                Time: [id number] OT Time Calculation (min): 18 min Charges:  OT General Charges $OT Visit: 1 Visit OT Evaluation $OT Eval Moderate Complexity: 1 Mod  Delbert Phenix OT Pager: [place] 04/13/2020, 12:02 PM

## 2020-04-13 NOTE — Evaluation (Signed)
Physical Therapy Evaluation Patient Details Name: Cynthia Young MRN: [id number] DOB: Jul 17, 1930 Today's Date: 04/13/2020   History of Present Illness  86 y.o. female with a history of hiatal hernia, GERD, hypothyroidism who presented to the ED 5/26 with abdominal cramping, loose stools.  s/p Laparoscopic cholecystectomy 04/11/2020 Dr. Lucia Gaskins. s/p ERCP 5/29  Clinical Impression  Pt admitted with above diagnosis.  Pt is independent at her baseline, today requiring HHA intermittently for hallway amb, pt is unsteady initially, gait stability improves with distance.  Will benefit from HHPT, may need SPC depending on progress, may want to defer to HHPT Pt currently with functional limitations due to the deficits listed below (see PT Problem List). Pt will benefit from skilled PT to increase their independence and safety with mobility to allow discharge to the venue listed below.       Follow Up Recommendations Home health PT;Supervision for mobility/OOB    Equipment Recommendations  Other (comment)(TBD may need SPC)    Recommendations for Other Services       Precautions / Restrictions Precautions Precautions: Fall      Mobility  Bed Mobility               General bed mobility comments: NT--pt in chair on arrival  Transfers Overall transfer level: Needs assistance   Transfers: Sit to/from Stand Sit to Stand: Min guard         General transfer comment: for safety  Ambulation/Gait Ambulation/Gait assistance: Min assist;Min guard Gait Distance (Feet): 110 Feet Assistive device: 1 person hand held assist Gait Pattern/deviations: Step-through pattern;Decreased stride length     General Gait Details: min to min/guard for safety, intermittent HHA for balance, pt more unsteady initially, stability improved with distance  Stairs            Wheelchair Mobility    Modified Rankin (Stroke Patients Only)       Balance Overall balance assessment: Needs  assistance(denies falls) Sitting-balance support: No upper extremity supported;Feet supported Sitting balance-Leahy Scale: Good     Standing balance support: Single extremity supported;No upper extremity supported Standing balance-Leahy Scale: Fair Standing balance comment: requires unilateral UE support for balance during dynamic tasks                             Pertinent Vitals/Pain Pain Assessment: 0-10 Pain Score: 3  Pain Location: right side/chest Pain Descriptors / Indicators: Discomfort;Grimacing Pain Intervention(s): Monitored during session;Limited activity within patient's tolerance    Home Living Family/patient expects to be discharged to:: Private residence Living Arrangements: Alone Available Help at Discharge: Family(dtrs) Type of Home: Apartment Home Access: Level entry     Home Layout: One level Home Equipment: None      Prior Function Level of Independence: Independent               Hand Dominance        Extremity/Trunk Assessment   Upper Extremity Assessment Upper Extremity Assessment: Defer to OT evaluation    Lower Extremity Assessment Lower Extremity Assessment: RLE deficits/detail;LLE deficits/detail RLE Deficits / Details: grossly 3+/5 bil LEs RLE Sensation: history of peripheral neuropathy(per pt bil foot numbness at baseline) LLE Sensation: history of peripheral neuropathy       Communication   Communication: No difficulties  Cognition Arousal/Alertness: Awake/alert Behavior During Therapy: WFL for tasks assessed/performed Overall Cognitive Status: Within Functional Limits for tasks assessed  General Comments      Exercises     Assessment/Plan    PT Assessment Patient needs continued PT services  PT Problem List Decreased activity tolerance;Decreased balance;Decreased knowledge of use of DME;Decreased mobility       PT Treatment Interventions DME  instruction;Therapeutic exercise;Gait training;Functional mobility training;Therapeutic activities;Patient/family education    PT Goals (Current goals can be found in the Care Plan section)  Acute Rehab PT Goals Patient Stated Goal: to feel better PT Goal Formulation: With patient Time For Goal Achievement: 04/28/20 Potential to Achieve Goals: Good    Frequency Min 3X/week   Barriers to discharge        Co-evaluation               AM-PAC PT "6 Clicks" Mobility  Outcome Measure Help needed turning from your back to your side while in a flat bed without using bedrails?: A Little Help needed moving from lying on your back to sitting on the side of a flat bed without using bedrails?: A Little Help needed moving to and from a bed to a chair (including a wheelchair)?: A Little Help needed standing up from a chair using your arms (e.g., wheelchair or bedside chair)?: A Little Help needed to walk in hospital room?: A Little Help needed climbing 3-5 steps with a railing? : A Little 6 Click Score: 18    End of Session Equipment Utilized During Treatment: Gait belt Activity Tolerance: Patient tolerated treatment well Patient left: in chair;with call bell/phone within reach;with chair alarm set   PT Visit Diagnosis: Difficulty in walking, not elsewhere classified (R26.2)    Time: [id number] PT Time Calculation (min) (ACUTE ONLY): 17 min   Charges:   PT Evaluation $PT Eval Low Complexity: [place], PT   Acute Rehab Dept [facility]): [id number]   04/13/2020   [facility] 04/13/2020, 11:00 AM

## 2020-04-13 NOTE — Progress Notes (Signed)
Subjective No acute events - successful ERCP yesterday. Feeling well. Tolerating diet and up mobilizing with therapies. Denies n/v. Denies significant abd pain.   Objective: Vital signs in last 24 hours: Temp:  [97.6 F (36.4 C)-98.4 F (36.9 C)] 98 F (36.7 C) (05/30 0839) Pulse Rate:  [55-68] 67 (05/30 0839) Resp:  [2-20] 14 (05/30 0839) BP: (153-178)/(64-81) 163/78 (05/30 0839) SpO2:  [93 %-100 %] 95 % (05/30 0506) Weight:  [54 kg] 54 kg (05/29 1151) Last BM Date: 04/08/20  Intake/Output from previous day: 05/29 0701 - 05/30 0700 In: 1560 [I.V.:1260; IV Piggyback:300] Out: 200 [Urine:200] Intake/Output this shift: No intake/output data recorded.  Gen: NAD, comfortable CV: RRR Pulm: Normal work of breathing Abd: Soft, minimal ttp around incisions. Not significantly distended. Incisions c/d/i without drainage or erythema Ext: SCDs in place  Lab Results: CBC  Recent Labs    04/12/20 0419 04/13/20 0348  WBC 11.9* 8.4  HGB 11.1* 11.0*  HCT 35.6* 34.2*  PLT 218 253   BMET Recent Labs    04/12/20 0419 04/13/20 0348  NA 135 141  K 4.3 4.1  CL 105 108  CO2 23 26  GLUCOSE 132* 135*  BUN 11 15  CREATININE 0.68 0.67  CALCIUM 8.2* 8.2*   PT/INR No results for input(s): LABPROT, INR in the last 72 hours. ABG No results for input(s): PHART, HCO3 in the last 72 hours.  Invalid input(s): PCO2, PO2  Studies/Results:  Anti-infectives: Anti-infectives (From admission, onward)   Start     Dose/Rate Route Frequency Ordered Stop   04/10/20 1200  cefTRIAXone (ROCEPHIN) 2 g in sodium chloride 0.9 % 100 mL IVPB     2 g 200 mL/hr over 30 Minutes Intravenous Every 24 hours 04/09/20 1609     04/09/20 2000  metroNIDAZOLE (FLAGYL) IVPB 500 mg     500 mg 100 mL/hr over 60 Minutes Intravenous Every 8 hours 04/09/20 1609     04/09/20 1130  cefTRIAXone (ROCEPHIN) 2 g in sodium chloride 0.9 % 100 mL IVPB     2 g 200 mL/hr over 30 Minutes Intravenous  Once 04/09/20 1120  04/09/20 1215   04/09/20 1130  metroNIDAZOLE (FLAGYL) IVPB 500 mg     500 mg 100 mL/hr over 60 Minutes Intravenous  Once 04/09/20 1120 04/09/20 1339       Assessment/Plan: Patient Active Problem List   Diagnosis Date Noted  . Acute cholecystitis 04/09/2020  . Pharyngitis 02/10/2020  . Varicose veins of both lower extremities 08/03/2019  . Hiatal hernia with GERD 03/16/2019  . Myalgia 09/25/2018  . Preventative health care 03/30/2018  . Hypothyroid   . Hyperlipidemia    Cynthia Young is a pleasant 18yoF whom is now s/p laparoscopic cholecystectomy with IOC 04/12/20 for acute cholecystitis - found to have cbd stones on IOC. No evident diverticulitis intraoperatively so no transmural process suspected - could be colitis based on CT?  -Recovering appropriately -ERCP cleared duct -LFTs trending in right direction, tbili remains normal -Ok for regular diet; dvt prophylaxis as per GI -She is ok for discharge from our perspective   LOS: 4 days   Sharon Mt. Dema Severin, M.D. [facility]

## 2020-04-13 NOTE — Progress Notes (Addendum)
Gastroenterology Inpatient Follow-up Note   PATIENT IDENTIFICATION  Cynthia Young is a 86 y.o. female with a pmh significant for hyperlipidemia, hypothyroidism, arthritis, status post cholecystectomy with finding of choledocholithiasis now status post ERCP.   Hospital Day: 5  SUBJECTIVE  Patient is doing well this morning. The patient denies fevers or chills. Patient has some abdominal discomfort mostly around her incisions. Liver tests were reviewed and are stable to slightly improved from yesterday. Leukocytosis improving. No evidence of post ERCP pancreatitis based on patient's clinical exam today. She is anticipating discharge hopefully later today.   OBJECTIVE  Scheduled Inpatient Medications:  . levothyroxine  25 mcg Intravenous Daily  . pantoprazole  40 mg Oral BID AC  . senna  1 tablet Oral BID  . sodium chloride flush  3 mL Intravenous Q12H   Continuous Inpatient Infusions:  . cefTRIAXone (ROCEPHIN)  IV 2 g (04/11/20 1216)   And  . metronidazole 500 mg (04/13/20 0444)   PRN Inpatient Medications: acetaminophen, HYDROcodone-acetaminophen, magnesium hydroxide, morphine injection, ondansetron **OR** ondansetron (ZOFRAN) IV, sorbitol   Physical Examination  Temp:  [97.6 F (36.4 C)-98.4 F (36.9 C)] 98.3 F (36.8 C) (05/30 1226) Pulse Rate:  [55-67] 63 (05/30 1226) Resp:  [14-18] 18 (05/30 1226) BP: (153-168)/(78-81) 168/79 (05/30 1226) SpO2:  [95 %] 95 % (05/30 0506) Temp (24hrs), Avg:98.1 F (36.7 C), Min:97.6 F (36.4 C), Max:98.4 F (36.9 C)  Weight: 54 kg GEN: NAD, appears younger than stated age, accompanied by daughter at bedside PSYCH: Cooperative, without pressured speech EYE: Conjunctivae pink, sclerae anicteric ENT: MMM CV: Nontachycardic RESP: No significant wheezing GI: NABS, soft, minimal tenderness to palpation around incisions, nondistended  MSK/EXT: No lower extremity edema SKIN: No jaundice NEURO:  Alert & Oriented x 3, no focal  deficits   Review of Data   Laboratory Studies   Recent Labs  Lab 04/13/20 0348  NA 141  K 4.1  CL 108  CO2 26  BUN 15  CREATININE 0.67  GLUCOSE 135*  CALCIUM 8.2*   Recent Labs  Lab 04/13/20 0348  AST 77*  ALT 81*  ALKPHOS 184*    Recent Labs  Lab 04/11/20 0420 04/11/20 0420 04/12/20 0419 04/12/20 0419 04/13/20 0348  WBC 14.8*   < > 11.9*   < > 8.4  HGB 11.6*   < > 11.1*   < > 11.0*  HCT 36.8   < > 35.6*   < > 34.2*  PLT 225  --  218  --  253   < > = values in this interval not displayed.   No results for input(s): APTT, INR in the last 168 hours.  Imaging Studies  No new imaging studies to review  GI Procedures and Studies  ERCP - Gastritis. Non-bleeding gastric ulcer with a clean ulcer base (Forrest Class III). Biopsied for H. pylori evaluation. - Erythematous duodenopathy. - The major papilla appeared to be small. - A filling defect consistent with a stone was seen on the cholangiogram. - The upper third of the main bile duct and middle third of the main bile duct were mildly dilated. - Choledocholithiasis was found. Complete removal was accomplished by biliary sphincterotomy and balloon sweep.   ASSESSMENT  Cynthia Young is a 86 y.o. female with a pmh significant for hyperlipidemia, hypothyroidism, arthritis, status post cholecystectomy with finding of choledocholithiasis now status post ERCP.    The patient is doing well.  We went over the results of her ERCP once again in detail today.  No evidence of post ERCP pancreatitis.  Patient had evidence of a gastric ulcer likely secondary to inflammation from her HIDA.  I do not believe that she actually had underlying colitis.  From my standpoint the patient does not necessarily require any further antibiotics from a GI standpoint unless that were to be indicated from a surgical standpoint.  I think she is okay from discharge perspective from GI.  She will minimize any nonsteroidal use for the next few weeks  to decrease the risk of post sphincterotomy bleeding.  Increased omeprazole/Prilosec to twice daily dosing to heal the gastric ulcer.  Plan to see her back in a few weeks to assess how she is doing and repeat endoscopy in approximately 3 months.  Patient will need a hepatic function panel in 1 to 2 weeks which we will arrange or she can have done at her primary care doctor who is part of [place].  Patient and daughter appreciative for all the care that they have received through the GI service.   PLAN/RECOMMENDATIONS  We will arrange a hepatic function panel in 1 to 2 weeks Follow-up in clinic with me which will be arranged in 4 to 6 weeks Follow-up pathology Continue omeprazole or Protonix 40 mg twice daily for now Repeat EGD in 3 months to assess gastric ulcer healing Low-fat diet for next 1 week to minimize risk of pancreatitis Minimize NSAID use as able When patient follows up in clinic, we should discuss potential flexible sigmoidoscopy versus colonoscopy for the reported findings of colitis although clinically she never had colitis coming into the hospital GI will sign off but please page/call with questions or concerns.   Justice Britain, MD [facility] Advanced Endoscopy Office # [phone]    LOS: 4 days  Irving Copas  04/13/2020, 2:39 PM

## 2020-04-13 NOTE — Discharge Summary (Signed)
Physician Discharge Summary  Cynthia Young XTK:[id number] DOB: 1930-01-11 DOA: 04/09/2020  PCP: Mosie Lukes, MD  Admit date: 04/09/2020 Discharge date: 04/13/2020  Admitted From: Home Disposition: Home   Recommendations for Outpatient Follow-up:  1. Follow up with PCP in 1-2 weeks 2. Please obtain CMP and CBC at follow up 3. Continue follow up with cardiology, Dr. Audie Box, continued to have asymptomatic 2nd degree AVB Mobitz 1 and 1st Deg AVB while admitted.  4. Follow up with surgery 6/15 scheduled prior to discharge. 5. Follow up with GI, started on PPI BID 6. Please follow up on the following pending results: EGD biopsy  Home Health: PT, OT Equipment/Devices: [facility] Discharge Condition: Stable CODE STATUS: Full Diet recommendation: As tolerated, low fat.   Brief/Interim Summary: Cynthia Young is an 86 y.o. female with a history of hiatal hernia w/GERD on PPI and hypothyroidism on synthroid who presented to the ED 5/26 with abdominal cramping, loose stools for about 4-5 weeks which had gotten progressively worse, sometimes associated with food intake. Evaluation with labs revealed leukocytosis to 12.7k, LFTs wnl. CT abd/pelvis showed pericholecystic fluid, intrahepatic biliary ductal dilatation, hepatic steatosis, and mid-sigmoid colitis vs. early diverticulitis. RUQ U/S demonstrated cholelithiasis with gallbladder wall thickening and distention with 44m proximal CBD. [facility] 5/27 showing acute cholecystitis, and subsequently had laparoscopic cholecystectomy by Dr. NLucia Gaskins5/28/2021. During the procedure acute and purulent discharge was noted and intraoperative cholangiogram showed filling defects in the common bile duct with contrast not extending to small bowel. Dr. MRush Landmark GI, performed ERCP 5/29 with successful evacuation of CBD stone, sphincterotomy, and biopsy of a gastric ulcer. Postprocedural course has been  unremarkable, WBC has normalized, LFTs have shown improvement, she's not required pain medications, and is tolerating a diet. After clearing with general surgery and GI, she will be discharged in stable condition 5/30.  Discharge Diagnoses:  Active Problems:   Acute cholecystitis  Acute on chronic cholecystitis: Confirmed on HIDA scan with acute and chronic appearing inflammation at time of lap chole with omental adhesions.  - s/p lap cholecystectomy by Dr. NLucia Gaskins5/28/2021. No further antibiotics recommended by surgery or GI. - Has not required analgesic or antiemetic since procedure.  Choledocholithiasis: +IOC during lap cholecystectomy, ERCP found stone removed by sphincterotomy and balloon sweep. - Monitor LFTs at follow up, improving at discharge.  Gastritic ulcer, duodenitis:  - Monitor gastric biopsy results for H. pylori (pending at discharge) - Initiate omeprazole 449mpo BID x3 months (prescribed), then once daily.  - GI to follow up with patient. Will need repeat EGD in 2-3 months for surveillance  Sigmoid colitis vs. early diverticulitis: Unclear whether this is a significant finding radiographically. No gross evidence of this when viewed laparoscopically. - Treated with bowel rest, IVF, abx. No further Tx planned.  Hypothyroidism: Recent TSH 1.48 - Continue synthroid   1st degree AVB, 2nd degree AV block Mobitz 1 (Wenkebach): Noted on telemetry with intermittently bradycardic rate.  - Discussed with cardiology, Dr. CrStanford Breedwho reviewed cardiac monitoring data from hospitalization.  - Avoid AV nodal agents.   Elevated BP: Likely related to pain, resolved.   Discharge Instructions Discharge Instructions    Discharge instructions   Complete by: As directed    You were treated for acute cholecystitis (gallbladder infection) and choledocholithiasis (stone in the bile duct) by removing the gallbladder and the stone. Since your liver function tests are improving, your  white blood cell count is normal, you have  no fever and are able to eat and drink, you have been cleared for discharge by surgery and GI with the following recommendations:  - Take omeprazole 73m twice daily for 3 months, then once daily thereafter. A new prescription was sent to your pharmacy for this.  - Follow up with GI for repeat EGD in a couple months ([facility] should reach out to schedule this) - Follow up with your PCP in 1-2 weeks for repeat liver function testing and to discuss referral to cardiology.  - If your symptoms return, seek medical attention right away.     Allergies as of 04/13/2020   No Known Allergies     Medication List    STOP taking these medications   omeprazole 20 MG tablet Commonly known as: PRILOSEC OTC   sodium polystyrene 15 GM/60ML suspension Commonly known as: KAYEXALATE     TAKE these medications   levothyroxine 50 MCG tablet Commonly known as: SYNTHROID Take 1 tablet (50 mcg total) by mouth daily.   omeprazole 40 MG capsule Commonly known as: PRILOSEC Take 1 capsule (40 mg total) by mouth daily.            Durable Medical Equipment  (From admission, onward)         Start     Ordered   04/13/20 1114  For home use only DME Cane  Once     04/13/20 1113         Follow-up Information    Surgery[facility] 04/29/2020.   Specialty: General Surgery Why: 11:30 am, arrive by 11:00am for check in process. Please bring photo ID and insurance card  Contact information: [address] [place] [phone]        BMosie Lukes MD. Schedule an appointment as soon as possible for a visit in 1 week(s).   Specialty: Family Medicine Contact information: [address] [phone]       OGeralynn Rile MD .   Specialties: Internal Medicine, Cardiology, Radiology Contact information: [place] [phone]         No Known Allergies  Consultations:  General Surgery, Dr. NLucia Gaskins GI, Dr. MRush Landmark Cardiology (phone consult) Dr. CStanford Breed Procedures/Studies: DG Cholangiogram Operative  Result Date: 04/11/2020 CLINICAL DATA:  Intraoperative cholangiogram during laparoscopic cholecystectomy. EXAM: INTRAOPERATIVE CHOLANGIOGRAM FLUOROSCOPY TIME:  23 seconds COMPARISON:  Nuclear medicine HIDA scan-04/10/2020; right upper quadrant abdominal ultrasound-04/09/2020; CT abdomen and pelvis-04/09/2020 FINDINGS: Intraoperative cholangiographic images of the right upper abdominal quadrant during laparoscopic cholecystectomy are provided for review. Surgical clips overlie the expected location of the gallbladder fossa. Contrast injection demonstrates selective cannulation of the central aspect of the cystic duct. There is passage of contrast through the central aspect of the cystic duct with filling of a non dilated common bile duct. There is passage of contrast though the CBD and into the descending portion of the duodenum. There is minimal reflux of injected contrast into the common hepatic duct and central aspect of the non dilated intrahepatic biliary system. There is a minimal amount of contrast extravasation about the cystic duct cannulation site. There is a persistent near occlusive filling defect in the distal aspect of the CBD worrisome for choledocholithiasis. IMPRESSION: Findings worrisome for near occlusive choledocholithiasis. For evaluation and management with ERCP could be performed as indicated. Electronically Signed   By: JSandi MariscalM.D.   On:  04/11/2020 10:00   NM Hepatobiliary Liver Func  Result Date: 04/10/2020 CLINICAL DATA:  Abdominal and chest pain for 3 weeks, severe for 3 days, nausea, vomiting; abnormal ultrasound abdomen with gallstones, gallbladder wall thickening, and distended gallbladder EXAM: NUCLEAR MEDICINE HEPATOBILIARY IMAGING TECHNIQUE: Sequential  images of the abdomen were obtained out to 60 minutes following intravenous administration of radiopharmaceutical. RADIOPHARMACEUTICALS:  5.0 mCi Tc-84m Choletec IV COMPARISON:  Ultrasound abdomen 04/09/2020 FINDINGS: Prompt tracer extraction from bloodstream indicating normal hepatocellular function. Prompt excretion of tracer into biliary tree. Duodenum visualized at 9 minutes. At 1 hour, gallbladder had not visualized. Patient received 2 mg of morphine IV and imaging was continued for 30 minutes. Gallbladder failed to visualize following morphine augmentation. Findings are consistent with cystic duct obstruction and acute cholecystitis. IMPRESSION: Patent CBD. Nonvisualization of the gallbladder despite morphine administration consistent with cystic duct obstruction and acute cholecystitis. Electronically Signed   By: MLavonia DanaM.D.   On: 04/10/2020 16:57   CT ABDOMEN PELVIS W CONTRAST  Result Date: 04/09/2020 CLINICAL DATA:  Abdominal pain EXAM: CT ABDOMEN AND PELVIS WITH CONTRAST TECHNIQUE: Multidetector CT imaging of the abdomen and pelvis was performed using the standard protocol following bolus administration of intravenous contrast. CONTRAST:  1034mOMNIPAQUE IOHEXOL 300 MG/ML  SOLN COMPARISON:  None. FINDINGS: Lower chest: There is mild atelectatic change in the anterior left base. Lung bases otherwise are clear. Hepatobiliary: There is a degree of hepatic steatosis. No focal liver lesions are evident. The gallbladder appears distended with suggestion of pericholecystic fluid. There is an area of increased attenuation in the inferior aspect of the gallbladder measuring 3.1 x 2.8 cm. Question mass within the gallbladder versus accumulation of sludge. There is borderline intrahepatic biliary duct dilatation. There is no common hepatic or common bile duct dilatation. No obstructing lesions seen in the biliary ductal system. Pancreas: No appreciable pancreatic mass or inflammatory focus. Spleen: No  splenic lesions are evident. Adrenals/Urinary Tract: Adrenals bilaterally appear unremarkable. There is a 5 mm probable cyst in the medial upper to mid right kidney. No evident hydronephrosis on either side. There is no appreciable renal or ureteral calculus on either side. Urinary bladder is midline with wall thickness within normal limits. Stomach/Bowel: There are scattered sigmoid diverticula which do not appear overtly inflamed. There is, however, mild wall thickening in the mid sigmoid colon with subtle mesenteric thickening in this area. No similar areas of apparent bowel inflammation noted elsewhere in the abdomen or pelvis. No evident bowel obstruction. The terminal ileum appears unremarkable. There is no evident free air or portal venous air. Vascular/Lymphatic: No abdominal aortic aneurysm. There is aortic atherosclerosis. Major venous structures appear patent. There is no evident adenopathy in the abdomen or pelvis. Reproductive: Uterus appears postmenopausal. There is extensive periuterine vascular calcification. No pelvic mass evident. Other: Appendix appears normal. No abscess or ascites evident in the abdomen or pelvis. Musculoskeletal: There is slight anterolisthesis of L4 on L5. No pars defects demonstrable on this study. There is degenerative change in the mid to lower lumbar spine. There are no appreciable blastic or lytic bone lesions. There is no intramuscular or abdominal wall lesion. IMPRESSION: 1. Gallbladder appears distended with suggestion of pericholecystic fluid. Question mass versus accumulated sludge in the inferior gallbladder. Question a degree of acute cholecystitis. Advise correlation with ultrasound of the gallbladder to further evaluate. Slight intrahepatic biliary duct dilatation may be a consequence of the changes is in the gallbladder. 2. Colitis versus early diverticulitis in the mid sigmoid  colon. No bowel inflammation elsewhere noted. No bowel obstruction. No abscess in the  abdomen or pelvis. Appendix appears normal. 3. Extensive periuterine vascular calcification. Question pelvic congestion syndrome. 4.  Hepatic steatosis. 5.  Aortic Atherosclerosis (ICD10-I70.0). Electronically Signed   By: Lowella Grip III M.D.   On: 04/09/2020 11:09   DG Chest Portable 1 View  Result Date: 04/09/2020 CLINICAL DATA:  Chest pain EXAM: PORTABLE CHEST 1 VIEW COMPARISON:  October 18, 2019 FINDINGS: Lungs are clear. There is an azygos lobe on the right, an anatomic variant. Heart is upper normal in size with pulmonary vascularity normal. No adenopathy. No pneumothorax. No bone lesions. IMPRESSION: Lungs clear.  Heart upper normal in size. Electronically Signed   By: Lowella Grip III M.D.   On: 04/09/2020 09:38   DG ERCP  Result Date: 04/12/2020 CLINICAL DATA:  Bile duct stones. EXAM: ERCP TECHNIQUE: Multiple spot images obtained with the fluoroscopic device and submitted for interpretation post-procedure. FLUOROSCOPY TIME:  Fluoroscopy Time:  2 minutes and 39 seconds COMPARISON:  Intraoperative cholangiogram 02/10/2020 FINDINGS: Retrograde cholangiogram was performed. Filling defects in the common bile duct are compatible with stones. Wire was advanced into the biliary system. Balloon sweep was performed for stone removal. IMPRESSION: Choledocholithiasis.  Stone removal. These images were submitted for radiologic interpretation only. Please see the procedural report for the amount of contrast and the fluoroscopy time utilized. Electronically Signed   By: Markus Daft M.D.   On: 04/12/2020 14:14   US Abdomen Limited RUQ  Result Date: 04/09/2020 CLINICAL DATA:  Abdominal pain. Abnormal gallbladder on CT study from earlier today. EXAM: ULTRASOUND ABDOMEN LIMITED RIGHT UPPER QUADRANT COMPARISON:  04/09/2020 CT abdomen/pelvis. FINDINGS: Gallbladder: Numerous layering shadowing gallstones in the gallbladder, largest 8 mm. Mild diffuse gallbladder wall thickening. Mildly distended  gallbladder. No pericholecystic fluid. No sonographic Murphy's sign reported. Common bile duct: Diameter: 6 mm proximally.  Mid to distal CBD obscured by bowel gas. Liver: No focal lesion identified. Within normal limits in parenchymal echogenicity. Portal vein is patent on color Doppler imaging with normal direction of blood flow towards the liver. Other: None. IMPRESSION: 1. Cholelithiasis with mild diffuse gallbladder wall thickening. Mildly distended gallbladder. No pericholecystic fluid. No sonographic Murphy sign. Ultrasound findings are equivocal for acute cholecystitis. Consider further evaluation with hepatobiliary scintigraphy as clinically warranted. 2. Top normal caliber common bile duct (6 mm diameter). 3. Normal liver. Electronically Signed   By: Ilona Sorrel M.D.   On: 04/09/2020 12:20     Laparoscopic cholecystectomy 04/11/2020 Dr. Lucia Gaskins.  ERCP 04/12/2020 Dr. Rush Landmark:  Impression: - Gastritis. Non-bleeding gastric ulcer with a  clean ulcer base (Forrest Class III). Biopsied for  H. pylori evaluation. - Erythematous duodenopathy. - The major papilla appeared to be small. - A filling defect consistent with a stone was seen  on the cholangiogram. - The upper third of the main bile duct and middle  third of the main bile duct were mildly dilated. - Choledocholithiasis was found. Complete removal  was accomplished by biliary sphincterotomy and  balloon sweep. Recommendation: - The patient will be observed post-procedure,  until all discharge criteria are met. - Return patient to hospital ward for ongoing  care. - Advance diet as tolerated. - Observe patient's clinical course. - Check liver enzymes (AST, ALT, alkaline  phosphatase, bilirubin) in the morning. - Watch for pancreatitis, bleeding, perforation,  and cholangitis. - Would hold chemical VTE prophylaxis for 48 hours  to decrease risk of post-sphincterotomy bleeding.  If anticoagulation is  necessary consider heparin  gtt without bolus in 6-hours and monitor closely. - Await path results. - PO Protonix or Omeprazole 40 mg BID x 29-month.  Then decrease to once daily and maintain. - Repeat EGD in 2-3 months to check on healing of  gastric ulceration/gastritis/duodenitis. - The findings and recommendations were discussed  with the patient. - The findings and recommendations were discussed  with the patient's family.   Subjective: Abdominal pain is significantly improved, not made worse with po intake, tolerating a diet. Got up to walk halls with PT this morning. No nausea or vomiting or diarrhea. Wants to go home. No palpitations, chest pain, dyspnea.  Discharge Exam: Vitals:   04/13/20 0506 04/13/20 0839  BP: (!) 160/78 (!) 163/78  Pulse: 61 67  Resp: 18 14  Temp: 98.4 F (36.9 C) 98 F (36.7 C)  SpO2: 95%    General: Pt is alert, awake, not in acute distress Cardiovascular: RRR, S1/S2 +, no rubs, no gallops Respiratory: CTA bilaterally, no wheezing, no rhonchi Abdominal: Soft, no tenderness to moderate palpation anywhere, some tenderness  to RUQ deeper palpation, surgical sites c/d/i w/dermabond, ND, bowel sounds + Extremities: No edema, no cyanosis  Labs: BNP (last 3 results) No results for input(s): BNP in the last 8760 hours. Basic Metabolic Panel: Recent Labs  Lab 04/09/20 0927 04/10/20 0428 04/11/20 0420 04/12/20 0419 04/13/20 0348  NA 138 136 140 135 141  K 4.2 3.7 4.2 4.3 4.1  CL 102 102 111 105 108  CO2 24 20* 21* 23 26  GLUCOSE 159* 115* 87 132* 135*  BUN 13 11 14 11 15   CREATININE 0.73 0.76 0.84 0.68 0.67  CALCIUM 9.2 8.4* 8.7* 8.2* 8.2*   Liver Function Tests: Recent Labs  Lab 04/09/20 0927 04/11/20 0420 04/12/20 0419 04/13/20 0348  AST 18 12* 110* 77*  ALT 13 9 88* 81*  ALKPHOS 59 45 180* 184*  BILITOT 0.7 0.7 0.8 0.9  PROT 7.1 5.6* 5.3* 5.5*  ALBUMIN 3.9 2.9* 2.5* 2.5*   Recent Labs  Lab 04/09/20 0927  LIPASE 19   No results for input(s): AMMONIA in the last 168 hours. CBC: Recent Labs  Lab 04/09/20 0927 04/10/20 0428 04/11/20 0420 04/12/20 0419 04/13/20 0348  WBC 12.7* 14.9* 14.8* 11.9* 8.4  NEUTROABS 11.4*  --   --   --   --   HGB 14.0 12.7 11.6* 11.1* 11.0*  HCT 42.6 40.5 36.8 35.6* 34.2*  MCV 92.8 96.0 96.6 95.7 93.7  PLT 250 226 225 218 253   Cardiac Enzymes: No results for input(s): CKTOTAL, CKMB, CKMBINDEX, TROPONINI in the last 168 hours. BNP: Invalid input(s): POCBNP CBG: No results for input(s): GLUCAP in the last 168 hours. D-Dimer No results for input(s): DDIMER in the last 72 hours. Hgb A1c No results for input(s): HGBA1C in the last 72 hours. Lipid Profile No results for input(s): CHOL, HDL, LDLCALC, TRIG, CHOLHDL, LDLDIRECT in the last 72 hours. Thyroid function studies No results for input(s): TSH, T4TOTAL, T3FREE, THYROIDAB in the last 72 hours.  Invalid input(s): FREET3 Anemia work up No results for input(s): VITAMINB12, FOLATE, FERRITIN, TIBC, IRON, RETICCTPCT in the last 72 hours. Urinalysis    Component Value Date/Time   COLORURINE YELLOW  02/15/2020 0403   APPEARANCEUR CLEAR 02/15/2020 0403   LABSPEC 1.015 02/15/2020 0403   PHURINE 6.0 02/15/2020 0403   GLUCOSEU NEGATIVE 02/15/2020 0403   HGBUR NEGATIVE 02/15/2020 0403   BILIRUBINUR negative 03/20/2020 1222   KETONESUR negative 03/20/2020 1222  KETONESUR NEGATIVE 02/15/2020 0403   PROTEINUR negative 03/20/2020 1222   PROTEINUR NEGATIVE 02/15/2020 0403   UROBILINOGEN 0.2 03/20/2020 1222   NITRITE Negative 03/20/2020 1222   NITRITE NEGATIVE 02/15/2020 0403   LEUKOCYTESUR Negative 03/20/2020 1222   LEUKOCYTESUR TRACE (A) 02/15/2020 0403    Microbiology Recent Results (from the past 240 hour(s))  SARS Coronavirus 2 by RT PCR (hospital order, performed in [facility] hospital lab) Nasopharyngeal Nasopharyngeal Swab     Status: None   Collection Time: 04/09/20 12:23 PM   Specimen: Nasopharyngeal Swab  Result Value Ref Range Status   SARS Coronavirus 2 NEGATIVE NEGATIVE Final    Comment: (NOTE) SARS-CoV-2 target nucleic acids are NOT DETECTED. The SARS-CoV-2 RNA is generally detectable in upper and lower respiratory specimens during the acute phase of infection. The lowest concentration of SARS-CoV-2 viral copies this assay can detect is 250 copies / mL. A negative result does not preclude SARS-CoV-2 infection and should not be used as the sole basis for treatment or other patient management decisions.  A negative result may occur with improper specimen collection / handling, submission of specimen other than nasopharyngeal swab, presence of viral mutation(s) within the areas targeted by this assay, and inadequate number of viral copies (<250 copies / mL). A negative result must be combined with clinical observations, patient history, and epidemiological information. Fact Sheet for Patients:   StrictlyIdeas.no Fact Sheet for Healthcare Providers: BankingDealers.co.za This test is not yet approved or cleared  by the  [place] FDA and has been authorized for detection and/or diagnosis of SARS-CoV-2 by FDA under an Emergency Use Authorization (EUA).  This EUA will remain in effect (meaning this test can be used) for the duration of the COVID-19 declaration under Section 564(b)(1) of the Act, 21 U.S.C. section 360bbb-3(b)(1), unless the authorization is terminated or revoked sooner. Performed at [facility], [address] [place], [place] [address]     Time coordinating discharge: Approximately 40 minutes  Patrecia Pour, MD  [facility] 04/13/2020, 11:28 AM

## 2020-04-14 NOTE — Anesthesia Postprocedure Evaluation (Signed)
Anesthesia Post Note  Patient: Cynthia Young  Procedure(s) Performed: ENDOSCOPIC RETROGRADE CHOLANGIOPANCREATOGRAPHY (ERCP) (N/A ) BIOPSY SPHINCTEROTOMY REMOVAL OF STONES     Patient location during evaluation: PACU Anesthesia Type: General Level of consciousness: awake and alert Pain management: pain level controlled Vital Signs Assessment: post-procedure vital signs reviewed and stable Respiratory status: spontaneous breathing, nonlabored ventilation, respiratory function stable and patient connected to nasal cannula oxygen Cardiovascular status: blood pressure returned to baseline and stable Postop Assessment: no apparent nausea or vomiting Anesthetic complications: no    Last Vitals:  Vitals:   04/13/20 0839 04/13/20 1226  BP: (!) 163/78 (!) 168/79  Pulse: 67 63  Resp: 14 18  Temp: 36.7 C 36.8 C  SpO2:      Last Pain:  Vitals:   04/13/20 0839  TempSrc:   PainSc: 0-No pain                 Effie Berkshire

## 2020-04-15 ENCOUNTER — Telehealth: Payer: Self-pay

## 2020-04-15 ENCOUNTER — Encounter: Payer: Self-pay | Admitting: *Deleted

## 2020-04-15 ENCOUNTER — Other Ambulatory Visit: Payer: Self-pay

## 2020-04-15 DIAGNOSIS — K810 Acute cholecystitis: Secondary | ICD-10-CM

## 2020-04-15 LAB — SURGICAL PATHOLOGY

## 2020-04-15 NOTE — Telephone Encounter (Signed)
05/12/20 at 230 pm appt with Dr Rush Landmark Lab order entered for 1-2 week HFP  EGD recall in Epic for 3 months.   The patient has been notified of this information and all questions answered.

## 2020-04-15 NOTE — Telephone Encounter (Signed)
-----   Message from Irving Copas., MD sent at 04/13/2020  2:43 PM EDT ----- Regarding: Follow up Cynthia Young, Please set up a follow up in clinic in 4-6 weeks with me. She needs HFP in 1-2 weeks (unless she gets this done by her PCP office - Dr. Randel Pigg). EGD recall in 70-month in the [facility] for gastric ulcer follow up. Thanks. GM

## 2020-04-16 NOTE — Telephone Encounter (Signed)
Has not been able to have a BM since last week Wednesday and is beginning to get concerned. Please advice-she can be reached at [phone].

## 2020-04-16 NOTE — Telephone Encounter (Signed)
Spoke with pt and discussed with her she can take Miralax otc 1-3 doses to have BM and then 1 dose daily as needed. Pt also had surgical questions, she was given the number for [facility] to call regarding those questions.

## 2020-04-18 ENCOUNTER — Encounter: Payer: Self-pay | Admitting: Gastroenterology

## 2020-04-21 ENCOUNTER — Telehealth: Payer: Self-pay | Admitting: Family Medicine

## 2020-04-21 NOTE — Progress Notes (Signed)
  Chronic Care Management   Note  04/21/2020 Name: Cynthia Young MRN: [id number] DOB: [birth year]  Cynthia Young is a 86 y.o. year old female who is a primary care patient of Mosie Lukes, MD. I reached out to [facility] by phone today in response to a referral sent by [name]'s PCP, Mosie Lukes, MD.   Cynthia Young was given information about Chronic Care Management services today including:  1. CCM service includes personalized support from designated clinical staff supervised by her physician, including individualized plan of care and coordination with other care providers 2. 24/7 contact phone numbers for assistance for urgent and routine care needs. 3. Service will only be billed when office clinical staff spend 20 minutes or more in a month to coordinate care. 4. Only one practitioner may furnish and bill the service in a calendar month. 5. The patient may stop CCM services at any time (effective at the end of the month) by phone call to the office staff.   Patient agreed to services and verbal consent obtained.   This note is not being shared with the patient for the following reason: To respect privacy (The patient or proxy has requested that the information not be shared).  Follow up plan:   Earney Hamburg Upstream Scheduler

## 2020-04-22 ENCOUNTER — Encounter: Payer: Self-pay | Admitting: *Deleted

## 2020-04-22 ENCOUNTER — Other Ambulatory Visit: Payer: Self-pay | Admitting: *Deleted

## 2020-04-22 NOTE — Patient Outreach (Signed)
[facility]) Care Management [facility] CM Telephone Outreach EMMI Red Alert notification PCP office completes Transition of Care follow up post-hospital discharge Post-hospital discharge day # 9  04/22/2020  [name] [birth year] [id number]  EMMI Red-Alert notification/ General Discharge EMMI call date/ day #: Friday April 18, 2020; day # 4 Red- Alert reason(s):  "has questions/ problems"  Successful telephone outreach attempt to Cynthia Young, 86 y/o female referred to [facility] RN CM yesterday by [facility] CMA for EMMI Red-Alert notification as above; patient has recent hospitalization May 26-30, 2021 for acute cholecystitis/ abdominal pain/ diarrhea; she had ERCP and lap-cholecystectomy while hospitalized; she was discharged to home/ self-care with home health services in place, however, patient refused home health services.  Patient has history including, but not limited to, HLD; hypothyroidism; GERD/ hiatal hernia; arthritis; and bradycardia with asymptomatic AV block.  HIPAA/ identity verified with patient and purpose of call/ [facility] CM services were discussed with patient; patient reports today, "having no problems;" stating that the problems she was having at time of [facility] automated call have now resolved; patient reports that she has a scheduled appointment with her PCP tomorrow and would prefer to discuss her clinical condition with her doctor; she confirms that her daughter's will provide transportation to all of her scheduled provider appointments post-hospital discharge.  She declines completing EMMI screening call today, assuring me that she is doing fine and stating several times that she wishes to first talk to her team of doctors around her post-hospital discharge care.  Explained to patient that I would place [facility] CM letter in mail to her, should she wish to participate in [facility] CM program in the future and encouraged her to contact [facility] CM if she changes her mind.  She is appreciative of  call today.  Plan:  Will make patient inactive with [facility] CM as she declines completing EMMI screening call and declines ongoing [facility] CM services and will make patient's PCP aware of same  Oneta Rack, RN, BSN, [place] Coordinator [facility] Care Management  [phone]

## 2020-04-22 NOTE — Progress Notes (Addendum)
[facility] [address] [place], [place] [address] [phone]  Date:  04/23/2020   Name:  Cynthia Young   DOB:  September 02, 1930   MRN:  [id number]  PCP:  Mosie Lukes, MD    Chief Complaint: Hospitalization Follow-up (gall bladder removal)   History of Present Illness:  Cynthia Young is a 86 y.o. very pleasant female patient who presents with the following:  Primary patient of my partner Dr. Charlett Blake here today for hospital follow-up I saw her myself in early May with symptoms of diarrhea and abdominal cramping-improved at that time.  She then got worse again, was found to have Choledocholithiasis treated surgically as below  Admit date: 04/09/2020 Discharge date: 04/13/2020 Recommendations for Outpatient Follow-up:  1. Follow up with PCP in 1-2 weeks 2. Please obtain CMP and CBC at follow up 3. Continue follow up with cardiology, Dr. Audie Box, continued to have asymptomatic 2nd degree AVB Mobitz 1 and 1st Deg AVB while admitted.  4. Follow up with surgery 6/15 scheduled prior to discharge. 5. Follow up with GI, started on PPI BID 6. Please follow up on the following pending results: EGD biopsy  Home Health: PT, OT  Brief/Interim Summary: Cynthia Noscheseis an 86 y.o.femalewith a history of hiatal hernia w/GERD on PPI and hypothyroidism on synthroid who presented to the ED 5/26 with abdominal cramping, loose stools for about 4-5 weeks which had gotten progressively worse, sometimes associated with food intake. Evaluation with labs revealed leukocytosis to 12.7k, LFTs wnl. CT abd/pelvis showed pericholecystic fluid, intrahepatic biliary ductal dilatation, hepatic steatosis, and mid-sigmoid colitis vs. early diverticulitis. RUQ U/S demonstrated cholelithiasis with gallbladder wall thickening and distention with 30mm proximal CBD. The patient was admitted to [facility] with equivocal findings, underwent HIDA 5/27 showing acute  cholecystitis, and subsequently had laparoscopic cholecystectomy by Dr. Lucia Gaskins 04/11/2020. During the procedure acute and purulent discharge was noted and intraoperative cholangiogram showed filling defects in the common bile duct with contrast not extending to small bowel. Dr. Rush Landmark, GI, performed ERCP 5/29 with successful evacuation of CBD stone, sphincterotomy, and biopsy of a gastric ulcer. Postprocedural course has been unremarkable, WBC has normalized, LFTs have shown improvement, she's not required pain medications, and is tolerating a diet. After clearing with general surgery and GI, she will be discharged in stable condition 5/30.  Discharge Diagnoses:  Active Problems:   Acute cholecystitis Acute on chronic cholecystitis: Confirmed on HIDA scan with acute and chronic appearing inflammation at time of lap chole with omental adhesions.  - s/p lap cholecystectomy by Dr. Lucia Gaskins 04/11/2020. No further antibiotics recommended by surgery or GI. - Has not required analgesic or antiemetic since procedure. Choledocholithiasis: +IOC during lap cholecystectomy, ERCP found stone removed by sphincterotomy and balloon sweep. - Monitor LFTs at follow up, improving at discharge.  Gastritic ulcer, duodenitis:  - Monitor gastric biopsy results for H. pylori (pending at discharge) - Initiate omeprazole 40mg  po BID x3 months (prescribed), then once daily.  - GI to follow up with patient. Will need repeat EGD in 2-3 months for surveillance  Sigmoid colitis vs. early diverticulitis: Unclear whether this is a significant finding radiographically. No gross evidence of this when viewed laparoscopically. - Treated with bowel rest, IVF, abx. No further Tx planned. Hypothyroidism: Recent TSH 1.48 - Continue synthroid  1st degree AVB,2nd degree AV block Mobitz 1 (Wenkebach): Noted on telemetry with intermittently bradycardic rate.  - Discussed with cardiology, Dr. Stanford Breed, who reviewed cardiac  monitoring  data from hospitalization.  - Avoid AV nodal agents.  Elevated BP: Likely related to pain, resolved.    At this point she notes good days and bad days, but overall feels that she is recovering She lost weight during her illness as below.  She is gradually getting her appetite back  Wt Readings from Last 3 Encounters:  04/23/20 113 lb (51.3 kg)  04/12/20 119 lb 0.8 oz (54 kg)  03/20/20 118 lb (53.5 kg)   3/21- 123 lbs  9/21- 124 lbs  She went from having diarrhea to now constipatoin She is passing small hard pieces of stool which is uncomfortable for her  She noted a bit of discharge from her umbilical incision today, was not sure if this is a problem  She also has noted some urinary frequency, other than no dysuria or hematuria  No fever or chills She has an appointment to see cardiology for discussion of AV nodal block later this month No chest pain  Patient Active Problem List   Diagnosis Date Noted  . Acute cholecystitis 04/09/2020  . Pharyngitis 02/10/2020  . Varicose veins of both lower extremities 08/03/2019  . Hiatal hernia with GERD 03/16/2019  . Myalgia 09/25/2018  . Preventative health care 03/30/2018  . Hypothyroid   . Hyperlipidemia     Past Medical History:  Diagnosis Date  . Arthritis   . Hyperlipidemia   . Hypothyroid     Past Surgical History:  Procedure Laterality Date  . BIOPSY  04/12/2020   Procedure: BIOPSY;  Surgeon: Rush Landmark Telford Nab., MD;  Location: [facility];  Service: Gastroenterology;;  . Cynthia Young Left   . CHOLECYSTECTOMY N/A 04/11/2020   Procedure: LAPAROSCOPIC CHOLECYSTECTOMY WITH INTRAOPERATIVE CHOLANGIOGRAM;  Surgeon: Alphonsa Overall, MD;  Location: [facility];  Service: General;  Laterality: N/A;  . ERCP N/A 04/12/2020   Procedure: ENDOSCOPIC RETROGRADE CHOLANGIOPANCREATOGRAPHY (ERCP);  Surgeon: Irving Copas., MD;  Location: [facility];  Service: Gastroenterology;  Laterality: N/A;  . FOOT SURGERY Right    bunion   . REMOVAL OF STONES  04/12/2020   Procedure: REMOVAL OF STONES;  Surgeon: Rush Landmark Telford Nab., MD;  Location: [facility];  Service: Gastroenterology;;  . [name]  04/12/2020   Procedure: [name];  Surgeon: Rush Landmark Telford Nab., MD;  Location: [facility];  Service: Gastroenterology;;    Social History   Tobacco Use  . Smoking status: Never Smoker  . Smokeless tobacco: Never Used  Vaping Use  . Vaping Use: Never used  Substance Use Topics  . Alcohol use: Yes    Comment: socially  . Drug use: Never    Family History  Problem Relation Age of Onset  . Cancer Mother   . Heart disease Father   . Arthritis Father        rheumatoid  . Cancer Brother        colon    No Known Allergies  Medication list has been reviewed and updated.  Current Outpatient Medications on File Prior to Visit  Medication Sig Dispense Refill  . levothyroxine (SYNTHROID) 50 MCG tablet Take 1 tablet (50 mcg total) by mouth daily. 90 tablet 1  . omeprazole (PRILOSEC) 40 MG capsule Take 1 capsule (40 mg total) by mouth daily. 30 capsule 2  . Polyethylene Glycol 3350 (MIRALAX PO) Take by mouth as needed.     No current facility-administered medications on file prior to visit.    Review of Systems:  As per HPI- otherwise negative.   Physical Examination: Vitals:  04/23/20 1454  BP: 112/60  Pulse: 71  Resp: 17  Temp: 97.7 F (36.5 C)  SpO2: 98%   Vitals:   04/23/20 1454  Weight: 113 lb (51.3 kg)  Height: 4\' 11"  (1.499 m)   Body mass index is 22.82 kg/m. Ideal Body Weight: Weight in (lb) to have BMI = 25: 123.5  GEN: no acute distress.  Looks well and younger than age 71: Atraumatic, Normocephalic.  Ears and Nose: No external deformity. CV: RRR, No M/G/R. No JVD. No thrill. No extra heart sounds. PULM: CTA B, no wheezes, crackles, rhonchi. No retractions. No resp. distress. No accessory muscle use.  ABD: S, ND, +BS. No rebound. No HSM.  Laparoscopic port  incisions appear to be healing well.  No evidence of acute infection. She notes mild discomfort over the lower abdomen and bladder EXTR: No c/c/e PSYCH: Normally interactive. Conversant.   Results for orders placed or performed in visit on 04/23/20  Urine Culture   Specimen: Urine  Result Value Ref Range   MICRO NUMBER: [id number]    SPECIMEN QUALITY: Adequate    Sample Source NOT GIVEN    STATUS: FINAL    ISOLATE 1:      Less than 10,000 CFU/mL of single Gram positive organism isolated. No further testing will be performed. If clinically indicated, recollection using a method to minimize contamination, with prompt transfer to Urine Culture Transport Tube, is recommended.  CBC  Result Value Ref Range   WBC 9.3 4.0 - 10.5 K/uL   RBC 4.37 3.87 - 5.11 Mil/uL   Platelets 362.0 150 - 400 K/uL   Hemoglobin 13.3 12.0 - 15.0 g/dL   HCT 40.2 36 - 46 %   MCV 92.1 78.0 - 100.0 fl   MCHC 33.0 30.0 - 36.0 g/dL   RDW 13.9 11.5 - 15.5 %  Comprehensive metabolic panel  Result Value Ref Range   Sodium 135 135 - 145 mEq/L   Potassium 5.5 (H) 3.5 - 5.1 mEq/L   Chloride 101 96 - 112 mEq/L   CO2 28 19 - 32 mEq/L   Glucose, Bld 108 (H) 70 - 99 mg/dL   BUN 16 6 - 23 mg/dL   Creatinine, Ser 0.84 0.40 - 1.20 mg/dL   Total Bilirubin 0.4 0.2 - 1.2 mg/dL   Alkaline Phosphatase 102 39 - 117 U/L   AST 15 0 - 37 U/L   ALT 11 0 - 35 U/L   Total Protein 7.2 6.0 - 8.3 g/dL   Albumin 3.9 3.5 - 5.2 g/dL   GFR 63.95 >60.00 mL/min   Calcium 9.6 8.4 - 10.5 mg/dL  POCT urinalysis dipstick  Result Value Ref Range   Color, UA yellow yellow   Clarity, UA clear clear   Glucose, UA negative negative mg/dL   Bilirubin, UA negative negative   Ketones, POC UA negative negative mg/dL   Spec Grav, UA 1.020 1.010 - 1.025   Blood, UA negative negative   pH, UA 5.0 5.0 - 8.0   Protein Ur, POC negative negative mg/dL   Urobilinogen, UA 0.2 0.2 or 1.0 E.U./dL   Nitrite, UA Negative Negative   Leukocytes, UA Negative  Negative    Assessment and Plan: Hospital discharge follow-up - Plan: CBC, Comprehensive metabolic panel  Urinary frequency - Plan: Urine Culture, POCT urinalysis dipstick  Acute blood loss anemia - Plan: CBC  Hyperkalemia - Plan: Potassium  Here today to follow-up from recent hospital admission with choledocholithiasis.  Discussed in detail, patient is making  positive progress.  I encouraged her to give herself time to heal from recent significant illness  UA appears benign, but will check urine culture Follow-up CBC and CMP today She is seeing surgery next week for postop follow-up  I have asked her to contact myself or her primary care provider with any concerns in the meantime  This visit occurred during the SARS-CoV-2 public health emergency.  Safety protocols were in place, including screening questions prior to the visit, additional usage of staff PPE, and extensive cleaning of exam room while observing appropriate contact time as indicated for disinfecting solutions.    Signed Lamar Blinks, MD  Received her labs 6/10 Received urine culture 6/13 Pt has not read my last mychart message - will ask staff to give her a call re: high K  Results for orders placed or performed in visit on 04/23/20  Urine Culture   Specimen: Urine  Result Value Ref Range   MICRO NUMBER: [id number]    SPECIMEN QUALITY: Adequate    Sample Source NOT GIVEN    STATUS: FINAL    ISOLATE 1:      Less than 10,000 CFU/mL of single Gram positive organism isolated. No further testing will be performed. If clinically indicated, recollection using a method to minimize contamination, with prompt transfer to Urine Culture Transport Tube, is recommended.  CBC  Result Value Ref Range   WBC 9.3 4.0 - 10.5 K/uL   RBC 4.37 3.87 - 5.11 Mil/uL   Platelets 362.0 150 - 400 K/uL   Hemoglobin 13.3 12.0 - 15.0 g/dL   HCT 40.2 36 - 46 %   MCV 92.1 78.0 - 100.0 fl   MCHC 33.0 30.0 - 36.0 g/dL   RDW 13.9 11.5 - 15.5  %  Comprehensive metabolic panel  Result Value Ref Range   Sodium 135 135 - 145 mEq/L   Potassium 5.5 (H) 3.5 - 5.1 mEq/L   Chloride 101 96 - 112 mEq/L   CO2 28 19 - 32 mEq/L   Glucose, Bld 108 (H) 70 - 99 mg/dL   BUN 16 6 - 23 mg/dL   Creatinine, Ser 0.84 0.40 - 1.20 mg/dL   Total Bilirubin 0.4 0.2 - 1.2 mg/dL   Alkaline Phosphatase 102 39 - 117 U/L   AST 15 0 - 37 U/L   ALT 11 0 - 35 U/L   Total Protein 7.2 6.0 - 8.3 g/dL   Albumin 3.9 3.5 - 5.2 g/dL   GFR 63.95 >60.00 mL/min   Calcium 9.6 8.4 - 10.5 mg/dL  POCT urinalysis dipstick  Result Value Ref Range   Color, UA yellow yellow   Clarity, UA clear clear   Glucose, UA negative negative mg/dL   Bilirubin, UA negative negative   Ketones, POC UA negative negative mg/dL   Spec Grav, UA 1.020 1.010 - 1.025   Blood, UA negative negative   pH, UA 5.0 5.0 - 8.0   Protein Ur, POC negative negative mg/dL   Urobilinogen, UA 0.2 0.2 or 1.0 E.U./dL   Nitrite, UA Negative Negative   Leukocytes, UA Negative Negative

## 2020-04-23 ENCOUNTER — Encounter: Payer: Self-pay | Admitting: Family Medicine

## 2020-04-23 ENCOUNTER — Other Ambulatory Visit: Payer: Self-pay

## 2020-04-23 ENCOUNTER — Ambulatory Visit (INDEPENDENT_AMBULATORY_CARE_PROVIDER_SITE_OTHER): Payer: Medicare Other | Admitting: Family Medicine

## 2020-04-23 ENCOUNTER — Ambulatory Visit: Payer: Medicare Other | Admitting: Family Medicine

## 2020-04-23 VITALS — BP 112/60 | HR 71 | Temp 97.7°F | Resp 17 | Ht 59.0 in | Wt 113.0 lb

## 2020-04-23 DIAGNOSIS — R350 Frequency of micturition: Secondary | ICD-10-CM | POA: Diagnosis not present

## 2020-04-23 DIAGNOSIS — D62 Acute posthemorrhagic anemia: Secondary | ICD-10-CM | POA: Diagnosis not present

## 2020-04-23 DIAGNOSIS — E875 Hyperkalemia: Secondary | ICD-10-CM

## 2020-04-23 DIAGNOSIS — Z09 Encounter for follow-up examination after completed treatment for conditions other than malignant neoplasm: Secondary | ICD-10-CM | POA: Diagnosis not present

## 2020-04-23 LAB — POCT URINALYSIS DIP (MANUAL ENTRY)
Bilirubin, UA: NEGATIVE
Blood, UA: NEGATIVE
Glucose, UA: NEGATIVE mg/dL
Ketones, POC UA: NEGATIVE mg/dL
Leukocytes, UA: NEGATIVE
Nitrite, UA: NEGATIVE
Protein Ur, POC: NEGATIVE mg/dL
Spec Grav, UA: 1.02 (ref 1.010–1.025)
Urobilinogen, UA: 0.2 E.U./dL
pH, UA: 5 (ref 5.0–8.0)

## 2020-04-23 NOTE — Patient Instructions (Addendum)
It was good to see you today- I will be in touch with your labs asap Please let me know if you are not continuing to improve over the next few weeks

## 2020-04-24 ENCOUNTER — Telehealth: Payer: Self-pay | Admitting: Family Medicine

## 2020-04-24 ENCOUNTER — Encounter: Payer: Self-pay | Admitting: Family Medicine

## 2020-04-24 LAB — URINE CULTURE
MICRO NUMBER:: 10571188
SPECIMEN QUALITY:: ADEQUATE

## 2020-04-24 LAB — CBC
HCT: 40.2 % (ref 36.0–46.0)
Hemoglobin: 13.3 g/dL (ref 12.0–15.0)
MCHC: 33 g/dL (ref 30.0–36.0)
MCV: 92.1 fl (ref 78.0–100.0)
Platelets: 362 10*3/uL (ref 150.0–400.0)
RBC: 4.37 Mil/uL (ref 3.87–5.11)
RDW: 13.9 % (ref 11.5–15.5)
WBC: 9.3 10*3/uL (ref 4.0–10.5)

## 2020-04-24 LAB — COMPREHENSIVE METABOLIC PANEL
ALT: 11 U/L (ref 0–35)
AST: 15 U/L (ref 0–37)
Albumin: 3.9 g/dL (ref 3.5–5.2)
Alkaline Phosphatase: 102 U/L (ref 39–117)
BUN: 16 mg/dL (ref 6–23)
CO2: 28 mEq/L (ref 19–32)
Calcium: 9.6 mg/dL (ref 8.4–10.5)
Chloride: 101 mEq/L (ref 96–112)
Creatinine, Ser: 0.84 mg/dL (ref 0.40–1.20)
GFR: 63.95 mL/min (ref >60.00)
Glucose, Bld: 108 mg/dL — ABNORMAL HIGH (ref 70–99)
Potassium: 5.5 mEq/L — ABNORMAL HIGH (ref 3.5–5.1)
Sodium: 135 mEq/L (ref 135–145)
Total Bilirubin: 0.4 mg/dL (ref 0.2–1.2)
Total Protein: 7.2 g/dL (ref 6.0–8.3)

## 2020-04-24 NOTE — Telephone Encounter (Signed)
For constipation usually recommend increased hydration and fiber in diet. Daily probiotics. If bowels not moving can use MOM 2 tbls po in 4 oz of warm prune juice by mouth every 2-3 days. If no results then repeat in 4 hours with  Dulcolax suppository pr, may repeat again in 4 more hours as needed. Seek care if symptoms worsen. Consider daily Miralax with Benefiber and/or Dulcolax if symptoms persist.

## 2020-04-24 NOTE — Addendum Note (Signed)
Addended by: Lamar Blinks C on: 04/24/2020 02:15 PM   Modules accepted: Orders

## 2020-04-24 NOTE — Telephone Encounter (Signed)
Patient was seen by Dr. Lorelei Pont yesterday.

## 2020-04-24 NOTE — Telephone Encounter (Signed)
Caller :Cynthia Young Call Back # [phone]  Patient calling in regards to constipation, patient states that she has been constipated seen gallbladder removal. Please Advise

## 2020-04-25 ENCOUNTER — Telehealth: Payer: Self-pay

## 2020-04-25 NOTE — Telephone Encounter (Signed)
Spoke with patient regarding constipation.  Patient reports adequate bowel movement this morning, feeling much better.  Patient states she has been taking Miralax daily, did take a vegetable laxative yesterday.  Encouraged to increase fluid intake and fiber in diet.

## 2020-04-25 NOTE — Telephone Encounter (Signed)
Nurse Assessment Nurse: Windle Guard, RN, Lesa Date/Time Eilene Ghazi Time): 04/24/2020 5:57:36 PM Confirm and document reason for call. If symptomatic, describe symptoms. ---Caller states she has gallbladder removed 2 weeks ago. Her belly button incision is leaking blood. Has the patient had close contact with a person known or suspected to have the novel coronavirus illness OR traveled / lives in area with major community spread (including international travel) in the last 14 days from the onset of symptoms? * If Asymptomatic, screen for exposure and travel within the last 14 days. ---No Does the patient have any new or worsening symptoms? ---Yes Will a triage be completed? ---Yes Related visit to physician within the last 2 weeks? ---Yes Does the PT have any chronic conditions? (i.e. diabetes, asthma, this includes High risk factors for pregnancy, etc.) ---No Is this a behavioral health or substance abuse call? ---No Guidelines Guideline Title Affirmed Question Affirmed Notes Nurse Date/Time Eilene Ghazi Time) Post-Op Incision Symptoms and Questions [1] Bleeding from incision AND [2] won't stop after 10 minutes of direct pressure Conner, RN, Lesa 04/24/2020 6:00:43 PM Disp. Time Eilene Ghazi Time) Disposition Final User 04/24/2020 6:08:32 PM Called On-Call Provider Conner, RN, Emmaline Kluver 04/24/2020 6:04:27 PM Go to ED Now Yes Conner, RN, LesaPLEASE NOTE: All timestamps contained within this report are represented as [place] Standard Time. CONFIDENTIALTY NOTICE: This fax transmission is intended only for the addressee. It contains information that is legally privileged, confidential or otherwise protected from use or disclosure. If you are not the intended recipient, you are strictly prohibited from reviewing, disclosing, copying using or disseminating any of this information or taking any action in reliance on or regarding this information. If you have received this fax in error, please notify us  immediately by telephone so that we can arrange for its return to [place]. Phone: [phone], Toll-Free: [phone], Fax: [phone] Page: 2 of 2 Call Id: [id number] [place] Disagree/Comply Comply Caller Understands Yes PreDisposition Did not know what to do Care Advice Given Per Guideline GO TO ED NOW: * You need to be seen in the Emergency Department. * Go to the ED at ___________ [place] now. Drive carefully. CONTINUE DIRECT PRESSURE FOR BLEEDING: * Put direct pressure on the bleeding area with a sterile gauze or clean cloth. ANOTHER ADULT SHOULD DRIVE: * It is better and safer if another adult drives instead of you. CARE ADVICE per Post-Op Incision Symptoms and Questions (Adult) guideline. * Continue doing this until seen. Comments User: Starleen Blue, RN Date/Time Eilene Ghazi Time): 04/24/2020 6:10:24 PM Gave caller Dr. Gwynn Burly advice. Caller verbalizes understanding Referrals [facility] - ED Paging DoctorName Phone DateTime Result/Outcome Message Type Notes Cathlean Cower- MD [phone] 04/24/2020 6:08:32 PM Called On Call Provider - Reached Doctor Paged Cathlean Cower- MD 04/24/2020 6:09:26 PM Spoke with On Call - General Message Result Dr. Jenny Reichmann states if no fever, she can follow up with Primary tomorrow

## 2020-04-25 NOTE — Telephone Encounter (Signed)
Spoke with patient regarding symptoms.  Patient reports belly button incision leaking blood, placed band-aid over site.  Today, leaking is minimal, orange in color.  Advised to call surgeon.

## 2020-04-29 ENCOUNTER — Telehealth: Payer: Self-pay | Admitting: Family Medicine

## 2020-04-29 NOTE — Telephone Encounter (Signed)
Daughter called to say she wont be able to take Cynthia Young to [place] until Monday. If it needs to be sooner or if Provider wants it done another way please give them a call.

## 2020-04-29 NOTE — Telephone Encounter (Signed)
Ok to wait until Monday for elam? if not I will try and get kim to override the lab schedule?

## 2020-04-29 NOTE — Addendum Note (Signed)
Addended by: Wynonia Musty A on: 04/29/2020 11:29 AM   Modules accepted: Orders

## 2020-05-03 NOTE — Progress Notes (Signed)
Cardiology Office Note:   Date:  05/05/2020  NAME:  Cynthia Young    MRN: [id number] DOB:  Jun 28, 1930   PCP:  Mosie Lukes, MD  Cardiologist:  Evalina Field, MD  Electrophysiologist:  None   Referring MD: Mosie Lukes, MD   Chief Complaint  Patient presents with  . Chest Pain   History of Present Illness:   Cynthia Young is a 86 y.o. female with a hx of GERD/hiatal hernia, Wenckebach who presents for follow-up. She was evaluated in December for atypical GP consistent with GERD. We started her on prilosec and she presents for follow-up today.   She recently had gallbladder surgery.  She reports has had no further episodes of chest pain since that time.  This was done in May 2021.  She reports that she was noted to have some slow heart rates in the hospital informed to follow-up with a cardiologist today.  I did review her EKG from Apr 09, 2020 which shows a left anterior fascicular block, sinus bradycardia and clear evidence of second-degree AV block Mobitz type I (Wenckebach).  She reports she is not having any chest pain or shortness of breath at this time.  She describes no dizziness or lightheadedness when she exerts herself.  She is still getting over her recent hospitalization and appetite is good.  She reports that she is not had any real symptoms.  She is a bit tired but getting over surgery this is not unexpected.  She does present with her daughter today.  She reports she has neuropathy and I informed her that she has great pulses in the lower extremities and is unlikely to have PAD.  She does have venous insufficiency which is quite apparent on exam.  Other than that she is without symptoms.   Problem List 1. HLD -T chol 220, HLD 59, LDL 137, TG 117 2.  First-degree AV block/sinus bradycardia/LAFB -Wenckebach 3. GERD 4. Hiatal hernia   Past Medical History: Past Medical History:  Diagnosis Date  . Arthritis   . Hyperlipidemia   . Hypothyroid     Past Surgical  History: Past Surgical History:  Procedure Laterality Date  . BIOPSY  04/12/2020   Procedure: BIOPSY;  Surgeon: Rush Landmark Telford Nab., MD;  Location: [facility];  Service: Gastroenterology;;  . Lillard Anes Left   . CHOLECYSTECTOMY N/A 04/11/2020   Procedure: LAPAROSCOPIC CHOLECYSTECTOMY WITH INTRAOPERATIVE CHOLANGIOGRAM;  Surgeon: Alphonsa Overall, MD;  Location: [facility];  Service: General;  Laterality: N/A;  . ERCP N/A 04/12/2020   Procedure: ENDOSCOPIC RETROGRADE CHOLANGIOPANCREATOGRAPHY (ERCP);  Surgeon: Irving Copas., MD;  Location: [facility];  Service: Gastroenterology;  Laterality: N/A;  . FOOT SURGERY Right    bunion  . REMOVAL OF STONES  04/12/2020   Procedure: REMOVAL OF STONES;  Surgeon: Rush Landmark Telford Nab., MD;  Location: [facility];  Service: Gastroenterology;;  . [name]  04/12/2020   Procedure: [name];  Surgeon: Irving Copas., MD;  Location: [facility];  Service: Gastroenterology;;    Current Medications: Current Meds  Medication Sig  . levothyroxine (SYNTHROID) 50 MCG tablet Take 1 tablet (50 mcg total) by mouth daily.  Marland Kitchen omeprazole (PRILOSEC) 40 MG capsule Take 1 capsule (40 mg total) by mouth daily.  . Polyethylene Glycol 3350 (MIRALAX PO) Take by mouth as needed.     Allergies:    Patient has no known allergies.   Social History: Social History   Socioeconomic History  . Marital status: Widowed    Spouse name: Not  on file  . Number of children: Not on file  . Years of education: Not on file  . Highest education level: Not on file  Occupational History  . Not on file  Tobacco Use  . Smoking status: Never Smoker  . Smokeless tobacco: Never Used  Vaping Use  . Vaping Use: Never used  Substance and Sexual Activity  . Alcohol use: Yes    Comment: socially  . Drug use: Never  . Sexual activity: Not Currently  Other Topics Concern  . Not on file  Social History Narrative   worked in [employer], short time. No  cigarettes or drug use. Live by self no dietary restictions and walks daily   Social Determinants of Health   Financial Resource Strain:   . Difficulty of Paying Living Expenses:   Food Insecurity:   . Worried About Charity fundraiser in the Last Year:   . Arboriculturist in the Last Year:   Transportation Needs:   . Film/video editor (Medical):   Marland Kitchen Lack of Transportation (Non-Medical):   Physical Activity:   . Days of Exercise per Week:   . Minutes of Exercise per Session:   Stress:   . Feeling of Stress :   Social Connections:   . Frequency of Communication with Friends and Family:   . Frequency of Social Gatherings with Friends and Family:   . Attends Religious Services:   . Active Member of Clubs or Organizations:   . Attends Archivist Meetings:   Marland Kitchen Marital Status:      Family History: The patient's family history includes Arthritis in her father; Cancer in her brother and mother; Heart disease in her father.  ROS:   All other ROS reviewed and negative. Pertinent positives noted in the HPI.     EKGs/Labs/Other Studies Reviewed:   The following studies were personally reviewed by me today:  EKG:  EKG reviewed in office dated 04/09/2020.  The ekg ordered today demonstrates sinus bradycardia, heart rate 55, left anterior fascicular block, second-degree AV block type I (Wenckebach), and was personally reviewed by me.   EKG was ordered in office today and personally reviewed by myself, his EKG demonstrates sinus bradycardia, heart rate 54, first-degree AV block, 224 ms, left anterior fascicular block  Recent Labs: 02/28/2020: Magnesium 2.2; TSH 1.48 04/23/2020: ALT 11; BUN 16; Creatinine, Ser 0.84; Hemoglobin 13.3; Platelets 362.0; Potassium 5.5; Sodium 135   Recent Lipid Panel    Component Value Date/Time   CHOL 204 (H) 02/28/2020 1138   TRIG 89.0 02/28/2020 1138   HDL 51.60 02/28/2020 1138   CHOLHDL 4 02/28/2020 1138   VLDL 17.8 02/28/2020 1138    LDLCALC 134 (H) 02/28/2020 1138    Physical Exam:   VS:  BP 124/72   Pulse 62   Temp 98.1 F (36.7 C)   Ht 4\' 11"  (1.499 m)   Wt 113 lb 9.6 oz (51.5 kg)   SpO2 98%   BMI 22.94 kg/m    Wt Readings from Last 3 Encounters:  05/05/20 113 lb 9.6 oz (51.5 kg)  04/23/20 113 lb (51.3 kg)  04/12/20 119 lb 0.8 oz (54 kg)    General: Well nourished, well developed, in no acute distress Heart: Atraumatic, normal size  Eyes: PEERLA, EOMI  Neck: Supple, no JVD Endocrine: No thryomegaly Cardiac: Normal S1, S2; RRR; no murmurs, rubs, or gallops Lungs: Clear to auscultation bilaterally, no wheezing, rhonchi or rales  Abd: Soft, nontender, no  hepatomegaly  Ext: No edema, pulses 2+ Musculoskeletal: No deformities, BUE and BLE strength normal and equal Skin: Warm and dry, no rashes   Neuro: Alert and oriented to person, place, time, and situation, CNII-XII grossly intact, no focal deficits  Psych: Normal mood and affect   ASSESSMENT:   Ceniya Fowers is a 86 y.o. female who presents for the following: 1. Chest pain, unspecified type   2. Sinus bradycardia   3. Mobitz type I Wenckebach atrioventricular block   4. LAFB (left anterior fascicular block)     PLAN:   1. Chest pain, unspecified type -No further episodes.  Improved with Prilosec.  We will hold on cardiac work-up at this time.  2. Sinus Bradycardia  3. Mobitz type I Wenckebach atrioventricular block 4. LAFB (left anterior fascicular block) -She had episodes of sinus bradycardia with Mobitz type I (Wenckebach) during hospitalization.  Thyroid studies in April were normal.  No echocardiogram in our system.  We will obtain echocardiogram. -Her EKG today demonstrates sinus bradycardia with first-degree AV block 224 ms and left anterior fascicular block.  No alarming symptoms.  She was counseled on symptoms to look out for which include dizziness and lightheadedness.  She has not had today.  She should avoid any AV nodal agents moving  forward.  We will see her on a yearly basis to make sure her echocardiogram is normal.   Disposition: Return in about 6 months (around 11/04/2020).  Medication Adjustments/Labs and Tests Ordered: Current medicines are reviewed at length with the patient today.  Concerns regarding medicines are outlined above.  Orders Placed This Encounter  Procedures  . EKG 12-Lead  . ECHOCARDIOGRAM COMPLETE   No orders of the defined types were placed in this encounter.   Patient Instructions  Medication Instructions:  The current medical regimen is effective;  continue present plan and medications.  *If you need a refill on your cardiac medications before your next appointment, please call your pharmacy*   Testing/Procedures: Echocardiogram - Your physician has requested that you have an echocardiogram. Echocardiography is a painless test that uses sound waves to create images of your heart. It provides your doctor with information about the size and shape of your heart and how well your heart's chambers and valves are working. This procedure takes approximately one hour. There are no restrictions for this procedure. This will be performed at our [facility] location - [address], [address].    Follow-Up: At [facility], you and your health needs are our priority.  As part of our continuing mission to provide you with exceptional heart care, we have created designated Provider Care Teams.  These Care Teams include your primary Cardiologist (physician) and Advanced Practice Providers (APPs -  Physician Assistants and Nurse Practitioners) who all work together to provide you with the care you need, when you need it.  We recommend signing up for the patient portal called "MyChart".  Sign up information is provided on this After Visit Summary.  MyChart is used to connect with patients for Virtual Visits (Telemedicine).  Patients are able to view lab/test results, encounter notes, upcoming  appointments, etc.  Non-urgent messages can be sent to your provider as well.   To learn more about what you can do with MyChart, go to [email or web address].    Your next appointment:   12 month(s)  The format for your next appointment:   In Person  Provider:   Eleonore Chiquito, MD  Time Spent with Patient: I have spent a total of 35 minutes with patient reviewing hospital notes, telemetry, EKGs, labs and examining the patient as well as establishing an assessment and plan that was discussed with the patient.  > 50% of time was spent in direct patient care.  Signed, Addison Naegeli. Audie Box, [place]  [address], [place], [place] [address] [phone]  05/05/2020 10:45 AM

## 2020-05-05 ENCOUNTER — Encounter: Payer: Self-pay | Admitting: Cardiovascular Disease

## 2020-05-05 ENCOUNTER — Ambulatory Visit (INDEPENDENT_AMBULATORY_CARE_PROVIDER_SITE_OTHER): Payer: Medicare Other | Admitting: Cardiovascular Disease

## 2020-05-05 ENCOUNTER — Other Ambulatory Visit: Payer: Self-pay

## 2020-05-05 ENCOUNTER — Other Ambulatory Visit (INDEPENDENT_AMBULATORY_CARE_PROVIDER_SITE_OTHER): Payer: Medicare Other

## 2020-05-05 VITALS — BP 124/72 | HR 62 | Temp 98.1°F | Ht 59.0 in | Wt 113.6 lb

## 2020-05-05 DIAGNOSIS — R001 Bradycardia, unspecified: Secondary | ICD-10-CM | POA: Diagnosis not present

## 2020-05-05 DIAGNOSIS — I444 Left anterior fascicular block: Secondary | ICD-10-CM

## 2020-05-05 DIAGNOSIS — I441 Atrioventricular block, second degree: Secondary | ICD-10-CM

## 2020-05-05 DIAGNOSIS — R079 Chest pain, unspecified: Secondary | ICD-10-CM

## 2020-05-05 DIAGNOSIS — E875 Hyperkalemia: Secondary | ICD-10-CM | POA: Diagnosis not present

## 2020-05-05 LAB — POTASSIUM: Potassium: 5.4 mEq/L — ABNORMAL HIGH (ref 3.5–5.1)

## 2020-05-05 NOTE — Patient Instructions (Signed)
Medication Instructions:  The current medical regimen is effective;  continue present plan and medications.  *If you need a refill on your cardiac medications before your next appointment, please call your pharmacy*   Testing/Procedures: Echocardiogram - Your physician has requested that you have an echocardiogram. Echocardiography is a painless test that uses sound waves to create images of your heart. It provides your doctor with information about the size and shape of your heart and how well your heart's chambers and valves are working. This procedure takes approximately one hour. There are no restrictions for this procedure. This will be performed at our [address] location - [address], [address].    Follow-Up: At [facility], you and your health needs are our priority.  As part of our continuing mission to provide you with exceptional heart care, we have created designated Provider Care Teams.  These Care Teams include your primary Cardiologist (physician) and Advanced Practice Providers (APPs -  Physician Assistants and Nurse Practitioners) who all work together to provide you with the care you need, when you need it.  We recommend signing up for the patient portal called "MyChart".  Sign up information is provided on this After Visit Summary.  MyChart is used to connect with patients for Virtual Visits (Telemedicine).  Patients are able to view lab/test results, encounter notes, upcoming appointments, etc.  Non-urgent messages can be sent to your provider as well.   To learn more about what you can do with MyChart, go to [email or web address].    Your next appointment:   12 month(s)  The format for your next appointment:   In Person  Provider:   [place] O'[place]al, MD    

## 2020-05-08 ENCOUNTER — Telehealth: Payer: Self-pay | Admitting: *Deleted

## 2020-05-08 ENCOUNTER — Telehealth: Payer: Self-pay | Admitting: Gastroenterology

## 2020-05-08 DIAGNOSIS — K810 Acute cholecystitis: Secondary | ICD-10-CM

## 2020-05-08 NOTE — Telephone Encounter (Signed)
Left message on machine to call back  

## 2020-05-08 NOTE — Telephone Encounter (Signed)
Dr Rush Landmark the pt's daughter is calling with questions about why the pt needs a follow up prior to the 3 month recall EGD.  I tried to explain that it is to discuss flex vs colon and she asked that you call her directly to discuss.  [phone] ask for Izora Gala     04/13/2020  2:43 PM EDT ----- Regarding: Follow up Armin Yerger, Please set up a follow up in clinic in 4-6 weeks with me. She needs HFP in 1-2 weeks (unless she gets this done by her PCP office - Dr. Randel Pigg). EGD recall in 72-month in the [facility] for gastric ulcer follow up. Thanks. GM

## 2020-05-08 NOTE — Telephone Encounter (Signed)
I spoke with the patient's daughter. Her liver tests were completed by her PCP when she got a CMP. Liver tests are normal. She does not need repeat liver test at this time. We will cancel next week's clinic visit. We will reschedule a clinic visit in approximately 4 to 6 weeks with myself. At that time we will discuss the role of repeat endoscopy as well as consideration of sigmoidoscopy versus colonoscopy in the setting of her abnormal CT abdomen/pelvis that had shown sigmoid thickening to ensure that we are not missing anything at that time. The patient's daughter agreed with this plan of action.  Justice Britain, MD [facility] Advanced Endoscopy Office # [phone]

## 2020-05-08 NOTE — Telephone Encounter (Signed)
Pt's daughter Izora Gala would like to speak with MD nurse about pt's upcoming appt on 6/28. She is inquiring about the necessity of this appt. Pls call her.

## 2020-05-08 NOTE — Telephone Encounter (Signed)
Daughter Cynthia Young [phone]) called and wanted to know why liver test was drawn that GI ordered.  They were told that they should be able to get it done when she get her blood drawn.  No order was put in.  Advised her that our doctor has to put in for the test and that we would be unable to pull orders from another office.  I advised her that I did try to add on test (okayed by Dr. Lorelei Pont)  but was it was unable to be added.  Patient would have to be redrawn again.  Will print order to place up front for them to go to the 3rd floor drawing station.  Order printed and placed up front for pickup.

## 2020-05-09 ENCOUNTER — Other Ambulatory Visit: Payer: Self-pay | Admitting: Family Medicine

## 2020-05-09 ENCOUNTER — Encounter: Payer: Self-pay | Admitting: Family Medicine

## 2020-05-09 DIAGNOSIS — E875 Hyperkalemia: Secondary | ICD-10-CM

## 2020-05-09 NOTE — Telephone Encounter (Signed)
Appt has been cancelled and will have the pt daughter to call to make a follow up appt in about 6 weeks.

## 2020-05-12 ENCOUNTER — Ambulatory Visit: Payer: Medicare Other | Admitting: Gastroenterology

## 2020-05-12 ENCOUNTER — Telehealth: Payer: Self-pay | Admitting: Family Medicine

## 2020-05-12 DIAGNOSIS — E875 Hyperkalemia: Secondary | ICD-10-CM

## 2020-05-12 NOTE — Telephone Encounter (Signed)
Hi Cynthia Young- I was not sure what you wanted to do with her slightly elevated K.   She is not on any meds that should cause this and has normal renal function.  I did a repeat and improved from 5.5 to 5.4.  Do you want me to order any further particular follow-up?   Thank you! Jess

## 2020-05-12 NOTE — Telephone Encounter (Signed)
-----   Message from Irving Copas., MD sent at 05/09/2020  6:28 AM EDT ----- Regarding: RE: Keane Scrape, Clinic visit next week is being rescheduled.  I have an urgent issue that I will be away the next few weeks so patients are being rescheduled.   Sorry. Thanks. Gabe ----- Message ----- From: Darreld Mclean, MD Sent: 05/09/2020   6:22 AM EDT To: Irving Copas., MD  Marlowe Sax  I believe you are seeing Ariel this coming Monday.  I wondered if your office would be able to check a potassium level for me when she is in?  We are following her mild hyperkalemia and I would like to recheck.  Will put an order for a BMP on her chart, please let me know if this is ok with your office.   Thanks so much and sorry for all the confusion about her LFTs!   Jess Gianna Calef

## 2020-05-12 NOTE — Telephone Encounter (Signed)
I think I would have her and her family review her diet and try and cut down on any potassium rich foods and continue to hydrate well. Then recheck cmp next week then if still high I will start her on Kayxylate.

## 2020-05-14 NOTE — Telephone Encounter (Signed)
Spoke with patient and advised of results.  Appointment scheduled for lab next week.

## 2020-05-14 NOTE — Telephone Encounter (Signed)
Wrote letter to pt as she did not ready my recent mychart message.  Cynthia Young also called her.  She will come in for a BMP next week, work on reducing K content of foods.  I sent her a list of high K foods

## 2020-05-14 NOTE — Addendum Note (Signed)
Addended by: Lamar Blinks C on: 05/14/2020 01:01 PM   Modules accepted: Orders

## 2020-05-22 ENCOUNTER — Other Ambulatory Visit (INDEPENDENT_AMBULATORY_CARE_PROVIDER_SITE_OTHER): Payer: Medicare Other

## 2020-05-22 ENCOUNTER — Other Ambulatory Visit: Payer: Self-pay

## 2020-05-22 DIAGNOSIS — E875 Hyperkalemia: Secondary | ICD-10-CM

## 2020-05-23 ENCOUNTER — Encounter: Payer: Self-pay | Admitting: Family Medicine

## 2020-05-23 ENCOUNTER — Ambulatory Visit (HOSPITAL_COMMUNITY): Payer: Medicare Other | Attending: Internal Medicine

## 2020-05-23 DIAGNOSIS — R001 Bradycardia, unspecified: Secondary | ICD-10-CM | POA: Insufficient documentation

## 2020-05-23 LAB — BASIC METABOLIC PANEL
BUN: 16 mg/dL (ref 6–23)
CO2: 28 mEq/L (ref 19–32)
Calcium: 10 mg/dL (ref 8.4–10.5)
Chloride: 105 mEq/L (ref 96–112)
Creatinine, Ser: 0.86 mg/dL (ref 0.40–1.20)
GFR: 62.22 mL/min (ref >60.00)
Glucose, Bld: 133 mg/dL — ABNORMAL HIGH (ref 70–99)
Potassium: 5 mEq/L (ref 3.5–5.1)
Sodium: 140 mEq/L (ref 135–145)

## 2020-06-02 ENCOUNTER — Telehealth: Payer: Self-pay | Admitting: Family Medicine

## 2020-06-02 NOTE — Telephone Encounter (Signed)
Yes it is OK for her to take the antibiotic prior to dental work

## 2020-06-02 NOTE — Telephone Encounter (Signed)
Patient states that she will have a tooth removed and would like to know if it is ok to take : Cefalexin 500mg  . Per Dentist patient needs to take before removal of teeth.     Please Advise

## 2020-06-03 NOTE — Telephone Encounter (Signed)
Patient notified

## 2020-06-04 NOTE — Telephone Encounter (Signed)
The pt has been advised that the appt with Dr Rush Landmark was cancelled with her daughter and Dr Rush Landmark.  She states she will speak with her daughter and call back when she decides to schedule followup.

## 2020-06-04 NOTE — Telephone Encounter (Signed)
Pt is requesting a call back from a nurse in regards as to why she needs an appt with Dr Rush Landmark.

## 2020-06-09 ENCOUNTER — Emergency Department (HOSPITAL_BASED_OUTPATIENT_CLINIC_OR_DEPARTMENT_OTHER): Payer: Medicare Other

## 2020-06-09 ENCOUNTER — Other Ambulatory Visit: Payer: Self-pay

## 2020-06-09 ENCOUNTER — Emergency Department (HOSPITAL_BASED_OUTPATIENT_CLINIC_OR_DEPARTMENT_OTHER)
Admission: EM | Admit: 2020-06-09 | Discharge: 2020-06-09 | Disposition: A | Discharge status: Home / Self Care | Payer: Medicare Other | Attending: Emergency Medicine | Admitting: Emergency Medicine

## 2020-06-09 ENCOUNTER — Encounter (HOSPITAL_BASED_OUTPATIENT_CLINIC_OR_DEPARTMENT_OTHER): Payer: Self-pay | Admitting: Emergency Medicine

## 2020-06-09 DIAGNOSIS — Y92002 Bathroom of unspecified non-institutional (private) residence single-family (private) house as the place of occurrence of the external cause: Secondary | ICD-10-CM | POA: Diagnosis not present

## 2020-06-09 DIAGNOSIS — S22080A Wedge compression fracture of T11-T12 vertebra, initial encounter for closed fracture: Secondary | ICD-10-CM | POA: Insufficient documentation

## 2020-06-09 DIAGNOSIS — S3992XA Unspecified injury of lower back, initial encounter: Secondary | ICD-10-CM | POA: Diagnosis not present

## 2020-06-09 DIAGNOSIS — Y9389 Activity, other specified: Secondary | ICD-10-CM | POA: Diagnosis not present

## 2020-06-09 DIAGNOSIS — R10814 Left lower quadrant abdominal tenderness: Secondary | ICD-10-CM | POA: Insufficient documentation

## 2020-06-09 DIAGNOSIS — S3991XA Unspecified injury of abdomen, initial encounter: Secondary | ICD-10-CM | POA: Diagnosis not present

## 2020-06-09 DIAGNOSIS — Y999 Unspecified external cause status: Secondary | ICD-10-CM | POA: Insufficient documentation

## 2020-06-09 DIAGNOSIS — E039 Hypothyroidism, unspecified: Secondary | ICD-10-CM | POA: Diagnosis not present

## 2020-06-09 DIAGNOSIS — W010XXA Fall on same level from slipping, tripping and stumbling without subsequent striking against object, initial encounter: Secondary | ICD-10-CM | POA: Diagnosis not present

## 2020-06-09 DIAGNOSIS — M545 Low back pain, unspecified: Secondary | ICD-10-CM

## 2020-06-09 LAB — CBC
HCT: 42.8 % (ref 36.0–46.0)
Hemoglobin: 13.5 g/dL (ref 12.0–15.0)
MCH: 29.8 pg (ref 26.0–34.0)
MCHC: 31.5 g/dL (ref 30.0–36.0)
MCV: 94.5 fL (ref 80.0–100.0)
Platelets: 247 10*3/uL (ref 150–400)
RBC: 4.53 MIL/uL (ref 3.87–5.11)
RDW: 14 % (ref 11.5–15.5)
WBC: 8.5 10*3/uL (ref 4.0–10.5)
nRBC: 0 % (ref 0.0–0.2)

## 2020-06-09 LAB — COMPREHENSIVE METABOLIC PANEL
ALT: 16 U/L (ref 0–44)
AST: 21 U/L (ref 15–41)
Albumin: 3.8 g/dL (ref 3.5–5.0)
Alkaline Phosphatase: 74 U/L (ref 38–126)
Anion gap: 11 (ref 5–15)
BUN: 17 mg/dL (ref 8–23)
CO2: 26 mmol/L (ref 22–32)
Calcium: 9.3 mg/dL (ref 8.9–10.3)
Chloride: 104 mmol/L (ref 98–111)
Creatinine, Ser: 0.89 mg/dL (ref 0.44–1.00)
GFR calc Af Amer: >60 mL/min (ref >60)
GFR calc non Af Amer: 58 mL/min — ABNORMAL LOW (ref >60)
Glucose, Bld: 120 mg/dL — ABNORMAL HIGH (ref 70–99)
Potassium: 4.3 mmol/L (ref 3.5–5.1)
Sodium: 141 mmol/L (ref 135–145)
Total Bilirubin: 0.6 mg/dL (ref 0.3–1.2)
Total Protein: 7 g/dL (ref 6.5–8.1)

## 2020-06-09 MED ORDER — OXYCODONE-ACETAMINOPHEN 5-325 MG PO TABS: 1.0000 | ORAL_TABLET | Freq: Once | ORAL | Status: DC

## 2020-06-09 MED ORDER — IOHEXOL 300 MG/ML  SOLN
100.0000 mL | Freq: Once | INTRAMUSCULAR | Status: AC | PRN
  Administered 2020-06-09: 100 mL via INTRAVENOUS

## 2020-06-09 MED ORDER — HYDROCODONE-ACETAMINOPHEN 5-325 MG PO TABS
1.0000 | ORAL_TABLET | Freq: Once | ORAL | Status: AC
  Administered 2020-06-09: 1 via ORAL
  Filled 2020-06-09: qty 1

## 2020-06-09 MED ORDER — FENTANYL CITRATE (PF) 100 MCG/2ML IJ SOLN
50.0000 ug | Freq: Once | INTRAMUSCULAR | Status: AC
  Administered 2020-06-09: 50 ug via INTRAVENOUS
  Filled 2020-06-09: qty 2

## 2020-06-09 MED ORDER — KETOROLAC TROMETHAMINE 30 MG/ML IJ SOLN
15.0000 mg | Freq: Once | INTRAMUSCULAR | Status: AC
  Administered 2020-06-09: 15 mg via INTRAVENOUS
  Filled 2020-06-09: qty 1

## 2020-06-09 MED ORDER — HYDROCODONE-ACETAMINOPHEN 5-325 MG PO TABS: 2.0000 | ORAL_TABLET | ORAL | 0 refills | Status: DC | PRN

## 2020-06-09 NOTE — ED Notes (Signed)
HIGH FALL RISK client, grandson at bedside, sr x 2 up, callbell within reach, pt instructed not to get up off stretcher without staff in room

## 2020-06-09 NOTE — ED Notes (Signed)
When pts grandson left the room for her to get into a gown, she said she ran into the bathroom because she had to pee so bad. When she fell, she sts she "peed all over myself on the floor."  So she had to get up and get in the shower to get clean.

## 2020-06-09 NOTE — ED Notes (Signed)
ED Provider at bedside. 

## 2020-06-09 NOTE — ED Triage Notes (Addendum)
Pt "ran into the bathroom this morning to take a pill and the next thing I knew I was on the floor."  Fell onto back and is having low back pain.  Pt did get up and took a shower and got dressed before coming to the ED. Ambulates normally  Sts she did not hit anything on the way down.  No LOC.  Sts her abdomen is starting to hurt now.  Sts she had gall bladder surgery a couple months ago and is concerned that she might have injured something inside during the fall.

## 2020-06-09 NOTE — ED Provider Notes (Signed)
[facility] DEPARTMENT Provider Note   CSN: [id number] Arrival date & time: 06/09/20  6644     History Chief Complaint  Patient presents with  . Fall    Cynthia Young is a 86 y.o. female.  86yo F w/ PMH including HLD, hypothyroidism, GERD who p/w fall and back pain.  Just prior to arrival, the patient woke up this morning had to urinate and was trying to rush to the bathroom when she thinks that she lost her balance and fell, landing on her back.  She did not hit her head or lose consciousness.  She has had severe, constant low back pain since the fall.  She reports chronic numbness of her legs from neuropathy but denies any change in sensation in her lower extremities.  No leg weakness.  Her pain has been wrapping around to her lower abdomen and she is concerned she may have injured something in her abdomen because she recently had cholecystectomy 75mo ago and is still healing from it.  She denies any vision changes, vomiting, extremity injury, or anticoagulant use.  The history is provided by the patient.  Fall       Past Medical History:  Diagnosis Date  . Arthritis   . Hyperlipidemia   . Hypothyroid     Patient Active Problem List   Diagnosis Date Noted  . Acute cholecystitis 04/09/2020  . Pharyngitis 02/10/2020  . Varicose veins of both lower extremities 08/03/2019  . Hiatal hernia with GERD 03/16/2019  . Myalgia 09/25/2018  . Preventative health care 03/30/2018  . Hypothyroid   . Hyperlipidemia     Past Surgical History:  Procedure Laterality Date  . BIOPSY  04/12/2020   Procedure: BIOPSY;  Surgeon: Rush Landmark Telford Nab., MD;  Location: [facility];  Service: Gastroenterology;;  . Lillard Anes Left   . CHOLECYSTECTOMY N/A 04/11/2020   Procedure: LAPAROSCOPIC CHOLECYSTECTOMY WITH INTRAOPERATIVE CHOLANGIOGRAM;  Surgeon: Alphonsa Overall, MD;  Location: [facility];  Service: General;  Laterality: N/A;  . ERCP N/A 04/12/2020   Procedure: ENDOSCOPIC  RETROGRADE CHOLANGIOPANCREATOGRAPHY (ERCP);  Surgeon: Irving Copas., MD;  Location: [facility];  Service: Gastroenterology;  Laterality: N/A;  . FOOT SURGERY Right    bunion  . REMOVAL OF STONES  04/12/2020   Procedure: REMOVAL OF STONES;  Surgeon: Rush Landmark Telford Nab., MD;  Location: [facility];  Service: Gastroenterology;;  . [name]  04/12/2020   Procedure: [name];  Surgeon: Mansouraty, Telford Nab., MD;  Location: [facility];  Service: Gastroenterology;;     OB History    Gravida  3   Para  3   Term      Preterm      AB      Living        SAB      TAB      Ectopic      Multiple      Live Births              Family History  Problem Relation Age of Onset  . Cancer Mother   . Heart disease Father   . Arthritis Father        rheumatoid  . Cancer Brother        colon    Social History   Tobacco Use  . Smoking status: Never Smoker  . Smokeless tobacco: Never Used  Vaping Use  . Vaping Use: Never used  Substance Use Topics  . Alcohol use: Yes    Comment: socially  .  Drug use: Never    Home Medications Prior to Admission medications   Medication Sig Start Date End Date Taking? Authorizing Provider  cephALEXin (KEFLEX) 500 MG capsule TAKE TWO CAPSULES BY MOUTH NOW FOR THE FIRST DOSE THEN TAKE ONE CAPSULE BY MOUTH EVERY 6 HOURS UNTIL GONE 06/02/20   [provider]  HYDROcodone-acetaminophen (NORCO/VICODIN) 5-325 MG tablet Take 2 tablets by mouth every 4 (four) hours as needed for up to 5 days for moderate pain or severe pain. 06/09/20 06/14/20  Vinson Tietze, Wenda Overland, MD  levothyroxine (SYNTHROID) 50 MCG tablet Take 1 tablet (50 mcg total) by mouth daily. 03/03/20   Mosie Lukes, MD  omeprazole (PRILOSEC) 40 MG capsule Take 1 capsule (40 mg total) by mouth daily. 04/13/20 07/12/20  Patrecia Pour, MD  Polyethylene Glycol 3350 (MIRALAX PO) Take by mouth as needed.    [provider]    Allergies    Patient  has no known allergies.  Review of Systems   Review of Systems All other systems reviewed and are negative except that which was mentioned in HPI  Physical Exam Updated Vital Signs BP (!) 134/65 (BP Location: Left Arm)   Pulse 52   Temp 98 F (36.7 C) (Oral)   Resp 18   Ht 4\' 11"  (1.499 m)   Wt 51.5 kg   SpO2 98%   BMI 22.92 kg/m   Physical Exam Vitals and nursing note reviewed.  Constitutional:      Appearance: She is well-developed.     Comments: In mild distress  Due to pain  HENT:     Head: Normocephalic and atraumatic.     Nose: Nose normal.  Eyes:     Conjunctiva/sclera: Conjunctivae normal.  Cardiovascular:     Rate and Rhythm: Normal rate and regular rhythm.     Heart sounds: Normal heart sounds. No murmur heard.   Pulmonary:     Effort: Pulmonary effort is normal.     Breath sounds: Normal breath sounds.  Abdominal:     General: Bowel sounds are normal. There is no distension.     Palpations: Abdomen is soft.     Tenderness: There is abdominal tenderness (L lateral lower quadrant).     Comments: Healed surgical scars  Musculoskeletal:        General: Normal range of motion.     Right lower leg: No edema.     Left lower leg: No edema.     Comments: No focal tenderness of midline spine, pt rubbing upper and mid lumbar back but no bruising  Skin:    General: Skin is warm and dry.  Neurological:     Mental Status: She is alert and oriented to person, place, and time.     Deep Tendon Reflexes: Reflexes normal.     Comments: Fluent speech Normal sensation BLE  Psychiatric:        Judgment: Judgment normal.     ED Results / Procedures / Treatments   Labs (all labs ordered are listed, but only abnormal results are displayed) Labs Reviewed  COMPREHENSIVE METABOLIC PANEL - Abnormal; Notable for the following components:      Result Value   Glucose, Bld 120 (*)    GFR calc non Af Amer 58 (*)    All other components within normal limits  CBC     EKG None  Radiology CT THORACIC SPINE WO CONTRAST  Result Date: 06/09/2020 CLINICAL DATA:  Fall EXAM: CT lumbar and thoracic spine without contrast  TECHNIQUE: Multiplanar CT images of the thoracic and lumbar spine were reconstructed from contemporary CT of the chest, abdomen, and pelvis. CONTRAST:  No additional COMPARISON:  CT abdomen 04/09/2020 FINDINGS: Alignment: There is no anteroposterior listhesis in the thoracic spine. Grade 1 anterolisthesis at L4-L5 secondary to arthropathy. Vertebrae: There is new compression deformity T12 with less than 50% loss of height at the superior endplate. Minor superior endplate retropulsion. There is mild degenerative endplate irregularity. Diffusely decreased osseous mineralization. Paraspinal and other soft tissues: Extra-spinal findings are better evaluated on concurrent dedicated imaging. Disc levels: Mild degenerative changes are present at thoracic levels without high-grade osseous encroachment on the spinal canal or neural foramina. Multilevel disc bulges and facet hypertrophy at lumbar levels without evidence of high-grade stenosis. IMPRESSION: T12 compression fracture with less than 50% loss of height and minor osseous retropulsion. This is age-indeterminate but new since 04/09/2020. Electronically Signed   By: Macy Mis M.D.   On: 06/09/2020 11:01   CT Abdomen Pelvis W Contrast  Result Date: 06/09/2020 CLINICAL DATA:  Abdominal trauma fall, left lower quadrant and low back pain. EXAM: CT ABDOMEN AND PELVIS WITH CONTRAST TECHNIQUE: Multidetector CT imaging of the abdomen and pelvis was performed using the standard protocol following bolus administration of intravenous contrast. CONTRAST:  153mL OMNIPAQUE IOHEXOL 300 MG/ML  SOLN COMPARISON:  None. FINDINGS: Lower chest: No acute abnormality. Hepatobiliary: Prior cholecystectomy. No focal hepatic abnormality. No hepatic injury or perihepatic hematoma. Pancreas: No focal abnormality or ductal  dilatation. Spleen: No splenic injury or perisplenic hematoma. Adrenals/Urinary Tract: No adrenal hemorrhage or renal injury identified. Bladder is unremarkable. Adrenal glands unremarkable. Stomach/Bowel: Colonic diverticulosis, most notable in the sigmoid colon. No active diverticulitis. Normal appendix. Stomach and small bowel decompressed, unremarkable. Vascular/Lymphatic: Aortic atherosclerosis. No evidence of aneurysm or adenopathy. Reproductive: Uterus and adnexa unremarkable.  No mass. Other: No free fluid or free air. Musculoskeletal: Mild T12 compression fracture, new since prior study. IMPRESSION: No acute findings in the abdomen or pelvis. Mild T12 compression fracture, new since previous study. Electronically Signed   By: Rolm Baptise M.D.   On: 06/09/2020 10:35   CT L-SPINE NO CHARGE  Result Date: 06/09/2020 CLINICAL DATA:  Fall EXAM: CT lumbar and thoracic spine without contrast TECHNIQUE: Multiplanar CT images of the thoracic and lumbar spine were reconstructed from contemporary CT of the chest, abdomen, and pelvis. CONTRAST:  No additional COMPARISON:  CT abdomen 04/09/2020 FINDINGS: Alignment: There is no anteroposterior listhesis in the thoracic spine. Grade 1 anterolisthesis at L4-L5 secondary to arthropathy. Vertebrae: There is new compression deformity T12 with less than 50% loss of height at the superior endplate. Minor superior endplate retropulsion. There is mild degenerative endplate irregularity. Diffusely decreased osseous mineralization. Paraspinal and other soft tissues: Extra-spinal findings are better evaluated on concurrent dedicated imaging. Disc levels: Mild degenerative changes are present at thoracic levels without high-grade osseous encroachment on the spinal canal or neural foramina. Multilevel disc bulges and facet hypertrophy at lumbar levels without evidence of high-grade stenosis. IMPRESSION: T12 compression fracture with less than 50% loss of height and minor osseous  retropulsion. This is age-indeterminate but new since 04/09/2020. Electronically Signed   By: Macy Mis M.D.   On: 06/09/2020 11:01    Procedures Procedures (including critical care time)  Medications Ordered in ED Medications  fentaNYL (SUBLIMAZE) injection 50 mcg (50 mcg Intravenous Given 06/09/20 0914)  iohexol (OMNIPAQUE) 300 MG/ML solution 100 mL (100 mLs Intravenous Contrast Given 06/09/20 1002)  HYDROcodone-acetaminophen (NORCO/VICODIN) 5-325 MG per  tablet 1 tablet (1 tablet Oral Given 06/09/20 1131)  ketorolac (TORADOL) 30 MG/ML injection 15 mg (15 mg Intravenous Given 06/09/20 1131)    ED Course  I have reviewed the triage vital signs and the nursing notes.  Pertinent labs & imaging results that were available during my care of the patient were reviewed by me and considered in my medical decision making (see chart for details).    MDM Rules/Calculators/A&P                          Patient was very uncomfortable on exam but neurologically intact with no physical exam findings suggestive of spinal cord injury.  Obtained CTs of T and L-spine as well as abdomen because she was complaining of abdominal pain since the fall.  CT shows T12 compression fracture with less than 50% loss of height, no other acute findings.  After receiving above medications, pain seems adequately controlled on repeat exam.  I discussed supportive measures at home including continued pain control and cautioned patient on side effects of pain medications.  Provided with neurosurgery spine follow-up information.  Extensively reviewed return precautions including any new neurologic deficits or signs/symptoms of cauda equina.  She voiced understanding. Final Clinical Impression(s) / ED Diagnoses Final diagnoses:  Low back pain  Compression fracture of T12 vertebra, initial encounter [facility])    Rx / DC Orders ED Discharge Orders         Ordered    HYDROcodone-acetaminophen (NORCO/VICODIN) 5-325 MG tablet   Every 4 hours PRN     Discontinue  Reprint     06/09/20 [place], Wenda Overland, MD 06/09/20 1231

## 2020-06-12 ENCOUNTER — Telehealth: Payer: Self-pay | Admitting: *Deleted

## 2020-06-12 ENCOUNTER — Telehealth: Payer: Self-pay | Admitting: Family Medicine

## 2020-06-12 ENCOUNTER — Telehealth: Payer: Medicare Other

## 2020-06-12 NOTE — Telephone Encounter (Signed)
Patient called in reference to fall, patient states fell Monday July 26,2021. Seen in ED for back pain and given pain medication and it not working . Patient would like to know if she can take tylenol

## 2020-06-12 NOTE — Telephone Encounter (Signed)
See other phone note

## 2020-06-12 NOTE — Telephone Encounter (Signed)
Patient called with questions about her ED visit about what she should do.  I advised her did she have her AVS and she did and we went over it together and she will call the Neurosurgeon to make follow up appt.

## 2020-06-13 ENCOUNTER — Telehealth: Payer: Self-pay | Admitting: *Deleted

## 2020-06-13 NOTE — Telephone Encounter (Signed)
Patient was seen in the ED on 06/09/20 for a fall and she has a compression fracture.   She was given hydrocodone and has now finished that up and would like to know if she can try OTC extra strength Tylenol?  If so how much should she take.  She is trying to call the neurosurgeon office to make a follow up appt.  She left a message with them yesterday and they have not called her back yet.

## 2020-06-13 NOTE — Telephone Encounter (Signed)
Patient notified

## 2020-06-13 NOTE — Telephone Encounter (Signed)
OK to take Tylenol 1000 mg (2 extra strength tabs) or 975 mg (3 regular strength tabs) every 6 hours as needed.

## 2020-06-13 NOTE — Telephone Encounter (Signed)
Agree with the Tylenol. Thank you.

## 2020-06-16 ENCOUNTER — Telehealth: Payer: Self-pay

## 2020-06-16 ENCOUNTER — Other Ambulatory Visit: Payer: Self-pay | Admitting: Family Medicine

## 2020-06-16 MED ORDER — HYDROCODONE-ACETAMINOPHEN 5-325 MG PO TABS: 1.0000 | ORAL_TABLET | Freq: Four times a day (QID) | ORAL | 0 refills | Status: AC | PRN

## 2020-06-16 NOTE — Telephone Encounter (Signed)
Patient's Daughter is reference to pain medication, Patient's daughter would like to know the status of After Hours call from Sunday.   Please Advise

## 2020-06-16 NOTE — Telephone Encounter (Signed)
I have sent in a refill on the Hydrocodone medication. Have her let us know when she hears from neurosurgery

## 2020-06-16 NOTE — Telephone Encounter (Signed)
Patient stated that the strength of hydrocodone did not work for her.  She feels like it is not strong enough. She would like something stronger.

## 2020-06-16 NOTE — Telephone Encounter (Signed)
Pt's daughter- [name] called back--[phone]   ---Caller states that mother fell on 26th of July and is still in a bit of pain. She has a FX vertebra--T12. Was seen in the ED. Pain is noted in the back. Hydrocodone/Acetaminophen administered. She has taken all of the medication and she needs more. She states that the extra strength Tylenol that she has is not helping.   Spoke with patient last week.  She is suppose to follow up with Neurosurgeon.  She had called and left a message had not heard back from them and was suppose to call back.

## 2020-06-16 NOTE — Telephone Encounter (Signed)
Please see my previous message

## 2020-06-16 NOTE — Telephone Encounter (Signed)
Patient stated that she did try and take 2 tabs as prescribed on previous script, which was to take 2 tabs every 4 hours.  She is calling the neurosurgeon back today again to get appt scheduled.

## 2020-06-16 NOTE — Telephone Encounter (Signed)
I already sent it in. Did they ever try 2 or 1.5 if so and that worked they can use it up that way then we can send in a higher strength next time. If they did not try it they should try and tell us if 1.5 or 2 tabs works better so we know what to do next. If she has any side effects or it does not work she should let us know so we can check something else. We could do a virtual visit tomorrow to discuss options if she would like.

## 2020-06-16 NOTE — Telephone Encounter (Signed)
Patient is called to check the status of medication. Patient states hydrocodone doesn't work, she would like something different. She does not want a virtual appointment "they don't work". Patient states she is in a lot of pain.

## 2020-06-16 NOTE — Telephone Encounter (Signed)
Pt's daughter- [name] called back--[phone] (Secondary)   Nurse Assessment Nurse: Noberto Retort, RN, Malvin Johns Date/Time Eilene Ghazi Time): 06/15/2020 12:36:35 PM Confirm and document reason for call. If symptomatic, describe symptoms. ---Caller states that mother fell on 26th of July and is still in a bit of pain. She has a FX vertebra--T12. Was seen in the ED. Pain is noted in the back. Hydrocodone/Acetaminophen administered. She has taken all of the medication and she needs more. She states that the extra strength Tylenol that she has is not helping. Has the patient had close contact with a person known or suspected to have the novel coronavirus illness OR traveled / lives in area with major community spread (including international travel) in the last 14 days from the onset of symptoms? * If Asymptomatic, screen for exposure and travel within the last 14 days. ---No Does the patient have any new or worsening symptoms? ---Yes Will a triage be completed? ---Yes Related visit to physician within the last 2 weeks? ---Yes Does the PT have any chronic conditions? (i.e. diabetes, asthma, this includes High risk factors for pregnancy, etc.) ---No Is this a behavioral health or substance abuse call? ---No Nurse: Noberto Retort, RN, Malvin Johns Date/Time (Eastern Time): 06/15/2020 12:42:25 PM Please select the assessment type ---Request for controlled medication refill Is there an on-call physician for the client? ---Yes Do the client directives specifically allow for paging the on-call regarding scheduled drugs? ---No PLEASE NOTE: All timestamps contained within this report are represented as [place] Standard Time. CONFIDENTIALTY NOTICE: This fax transmission is intended only for the addressee. It contains information that is legally privileged, confidential or otherwise protected from use or disclosure. If you are not the intended recipient, you are strictly prohibited from reviewing, disclosing, copying  using or disseminating any of this information or taking any action in reliance on or regarding this information. If you have received this fax in error, please notify us immediately by telephone so that we can arrange for its return to [place]. Phone: [phone], Toll-Free: [phone], Fax: [phone] Page: 2 of 2 Call Id: [id number] Guidelines Guideline Title Affirmed Question Affirmed Notes Nurse Date/Time Eilene Ghazi Time) Back Pain [1] SEVERE back pain (e.g., excruciating, unable to do any normal activities) AND [2] not improved 2 hours after pain medicine Noberto Retort, RN, Malvin Johns 06/15/2020 12:39:18 PM Disp. Time Eilene Ghazi Time) Disposition Final User 06/15/2020 12:41:47 PM See HCP within 4 Hours (or PCP triage) Yes Noberto Retort, RN, Renne Musca Disagree/Comply Disagree Caller Understands Yes PreDisposition InappropriateToAsk Care Advice Given Per Guideline SEE HCP WITHIN 4 HOURS (OR PCP TRIAGE): * IF OFFICE WILL BE CLOSED AND NO PCP (PRIMARY CARE PROVIDER) SECOND-LEVEL TRIAGE: You need to be seen within the next 3 or 4 hours. A nearby Urgent Care Center [facility]) is often a good source of care. Another choice is to go to the ED. Go sooner if you become worse. PAIN MEDICINES: * For pain relief, you can take either acetaminophen, ibuprofen, or naproxen. PAIN MEDICINES - EXTRA NOTES AND WARNINGS: * Use the lowest amount of medicine that makes your pain better. * Before taking any medicine, read all the instructions on the package. CALL BACK IF: * You become worse. CARE ADVICE given per Back Pain (Adult) guideline. Referrals GO TO FACILITY REFUSED

## 2020-06-16 NOTE — Telephone Encounter (Signed)
Nurse Assessment Nurse: Susy Manor, RN, Megan Date/Time (Eastern Time): 06/15/2020 11:11:57 AM Confirm and document reason for call. If symptomatic, describe symptoms. ---Caller states she fell on her back last Monday. She had a CT done it showed a fractured vertebrate. Caller states she was given hydrocodone. That hasn't helped. She is continuing to have pain. Has the patient had close contact with a person known or suspected to have the novel coronavirus illness OR traveled / lives in area with major community spread (including international travel) in the last 14 days from the onset of symptoms? * If Asymptomatic, screen for exposure and travel within the last 14 days. ---No Does the patient have any new or worsening symptoms? ---Yes Will a triage be completed? ---Yes Related visit to physician within the last 2 weeks? ---Yes Does the PT have any chronic conditions? (i.e. diabetes, asthma, this includes High risk factors for pregnancy, etc.) ---Unknown Is this a behavioral health or substance abuse call? ---No Nurse: Susy Manor, RN, Megan Date/Time (Eastern Time): 06/15/2020 11:14:59 AM Please select the assessment type ---Verbal order / New medication order Additional Documentation ---Patient requesting pain medication. Does the client directives allow for assistance with medications after hours? ---No PLEASE NOTE: All timestamps contained within this report are represented as [place] Standard Time. CONFIDENTIALTY NOTICE: This fax transmission is intended only for the addressee. It contains information that is legally privileged, confidential or otherwise protected from use or disclosure. If you are not the intended recipient, you are strictly prohibited from reviewing, disclosing, copying using or disseminating any of this information or taking any action in reliance on or regarding this information. If you have received this fax in error, please notify us immediately by telephone so that we  can arrange for its return to [place]. Phone: [phone], Toll-Free: [phone], Fax: [phone] Page: 2 of 2 Call Id: [id number] Guidelines Guideline Title Affirmed Question Affirmed Notes Nurse Date/Time Eilene Ghazi Time) Back Injury [1] SEVERE PAIN in kidney area (flank) AND [2] follows direct blow to that site Susy Manor, RN, Megan 06/15/2020 11:16:22 AM Disp. Time Eilene Ghazi Time) Disposition Final User 06/15/2020 11:23:16 AM Go to ED Now Yes Susy Manor, RN, Jinny Blossom Caller Disagree/Comply Disagree Caller Understands Yes PreDisposition Call Doctor Care Advice Given Per Guideline GO TO ED NOW: CARE ADVICE given per Back Injury (Adult) guideline. Referrals GO TO FACILITY REFUSED

## 2020-06-17 ENCOUNTER — Other Ambulatory Visit: Payer: Self-pay | Admitting: Family Medicine

## 2020-06-17 ENCOUNTER — Telehealth: Payer: Self-pay | Admitting: Gastroenterology

## 2020-06-17 NOTE — Telephone Encounter (Signed)
I am willing to let her try oxycodone but this is a controlled substance so she will need an appointment to discuss how it worked and document response. I will send in #30 of the oxycodone/APAP 7.5 mg and she can try for a few days but we will need an appointment to reassess.

## 2020-06-17 NOTE — Telephone Encounter (Signed)
The pt called complaining with abd pain.  She has had this pain for some time.  She was scheduled for a follow up however her daughter called and cancelled.  She stated she would call back to reschedule if she decided.  She is also due for EGD and was to discuss flex vs colon.  Appt made for 8/10 with Colleen to discuss.

## 2020-06-17 NOTE — Telephone Encounter (Signed)
Patient called states she is having a lot of abdominal pain and is highly concerned please advise

## 2020-06-17 NOTE — Telephone Encounter (Signed)
Patient will hold on that oxycodone.  She picked up the hydrocodone before I could call her back.

## 2020-06-17 NOTE — Telephone Encounter (Signed)
Just as well since [employer] not working

## 2020-06-19 ENCOUNTER — Telehealth: Payer: Self-pay | Admitting: Gastroenterology

## 2020-06-19 ENCOUNTER — Telehealth: Payer: Self-pay | Admitting: *Deleted

## 2020-06-19 NOTE — Telephone Encounter (Signed)
The pt called to get a sooner appt than 8/10 with Colleen.  I advised her that no sooner appt available.  She states she is very upset that we can not see her today that she is in so much pain she can not walk.  I advised her that if she is in that much pain she should be evaluated at the ED. The pt said well you are my doctor that removed my gallbladder and should see me today.  I told her that we are not the surgeons office and she states well I want to see someone at your office today.  I again advised that we have no appts today and she states " I am not arguing about this" and hung up.

## 2020-06-19 NOTE — Telephone Encounter (Signed)
FYI

## 2020-06-19 NOTE — Telephone Encounter (Signed)
Cynthia Young, I am sorry to hear about the discomfort that she is having. As you are correct we didn't remove her gallbladder. Certainly sorry we do not have an earlier appointment. There is some notation in chart that she is having constipation. Would recommend aggressive hydration.  If no BM for the last few days, suspect constipation causing issues. Recommend the medications as outlined by Dr. Charlett Blake in her notation on MyChart/EPIC. If no BM after the medications noted then she needs and Enema. Maybe she can come in for some labs (CBC/CMP/Amylase/Lipase) so we can see if any issues are occurring, but I suspect this is likely referring pain from her acute back fracture. Can also come in for a 2-view KUB to check for acute issues and see what the stool burden in colon is doing. Thanks. GM

## 2020-06-19 NOTE — Telephone Encounter (Signed)
Can also encourage her to try the following to clean out her bowels. increase hydration and Daily probiotics. If bowels not moving use MOM 2 tbls po in 4 oz of warm prune juice by mouth. If no results then repeat in 4 hours with  Dulcolax suppository pr, may repeat again in 4 more hours as needed.This often cleans people out.

## 2020-06-19 NOTE — Telephone Encounter (Signed)
Left message on machine to call back  

## 2020-06-19 NOTE — Telephone Encounter (Signed)
Patient called and stated that she was having some abdominal pain and discomfort along with her back pain from back fracture.  She stated that it hurts every time she gets up to go to the bathroom.  She is bloated.  I asked when was the last time she had bowel movement.  She stated that last bowel movement was Saturday. Advised her to call GI now to to if they can get her before Tuesday.  She does not want to go to ED.

## 2020-06-20 NOTE — Telephone Encounter (Signed)
Left message on machine to call back will await further communication from the pt  

## 2020-06-23 NOTE — Telephone Encounter (Signed)
See GI note

## 2020-06-24 ENCOUNTER — Encounter: Payer: Self-pay | Admitting: Nurse Practitioner

## 2020-06-24 ENCOUNTER — Other Ambulatory Visit (INDEPENDENT_AMBULATORY_CARE_PROVIDER_SITE_OTHER): Payer: Medicare Other

## 2020-06-24 ENCOUNTER — Other Ambulatory Visit: Payer: Self-pay

## 2020-06-24 ENCOUNTER — Ambulatory Visit (INDEPENDENT_AMBULATORY_CARE_PROVIDER_SITE_OTHER): Payer: Medicare Other | Admitting: Nurse Practitioner

## 2020-06-24 ENCOUNTER — Ambulatory Visit (INDEPENDENT_AMBULATORY_CARE_PROVIDER_SITE_OTHER)
Admission: RE | Admit: 2020-06-24 | Discharge: 2020-06-24 | Disposition: A | Discharge status: Home / Self Care | Payer: Medicare Other | Source: Ambulatory Visit | Attending: Nurse Practitioner | Admitting: Nurse Practitioner

## 2020-06-24 VITALS — BP 140/60 | HR 52 | Ht <= 58 in | Wt 108.5 lb

## 2020-06-24 DIAGNOSIS — R1010 Upper abdominal pain, unspecified: Secondary | ICD-10-CM

## 2020-06-24 DIAGNOSIS — K259 Gastric ulcer, unspecified as acute or chronic, without hemorrhage or perforation: Secondary | ICD-10-CM

## 2020-06-24 DIAGNOSIS — S22080A Wedge compression fracture of T11-T12 vertebra, initial encounter for closed fracture: Secondary | ICD-10-CM | POA: Diagnosis not present

## 2020-06-24 DIAGNOSIS — K5900 Constipation, unspecified: Secondary | ICD-10-CM

## 2020-06-24 DIAGNOSIS — R109 Unspecified abdominal pain: Secondary | ICD-10-CM | POA: Diagnosis not present

## 2020-06-24 DIAGNOSIS — M546 Pain in thoracic spine: Secondary | ICD-10-CM | POA: Diagnosis not present

## 2020-06-24 LAB — CBC WITH DIFFERENTIAL/PLATELET
Basophils Absolute: 0.1 10*3/uL (ref 0.0–0.1)
Basophils Relative: 1.2 % (ref 0.0–3.0)
Eosinophils Absolute: 0.1 10*3/uL (ref 0.0–0.7)
Eosinophils Relative: 1.4 % (ref 0.0–5.0)
HCT: 41.2 % (ref 36.0–46.0)
Hemoglobin: 13.5 g/dL (ref 12.0–15.0)
Lymphocytes Relative: 26.7 % (ref 12.0–46.0)
Lymphs Abs: 2.1 10*3/uL (ref 0.7–4.0)
MCHC: 32.6 g/dL (ref 30.0–36.0)
MCV: 91.6 fl (ref 78.0–100.0)
Monocytes Absolute: 0.7 10*3/uL (ref 0.1–1.0)
Monocytes Relative: 9.4 % (ref 3.0–12.0)
Neutro Abs: 4.8 10*3/uL (ref 1.4–7.7)
Neutrophils Relative %: 61.3 % (ref 43.0–77.0)
Platelets: 291 10*3/uL (ref 150.0–400.0)
RBC: 4.5 Mil/uL (ref 3.87–5.11)
RDW: 14.4 % (ref 11.5–15.5)
WBC: 7.9 10*3/uL (ref 4.0–10.5)

## 2020-06-24 LAB — COMPREHENSIVE METABOLIC PANEL
ALT: 6 U/L (ref 0–35)
AST: 11 U/L (ref 0–37)
Albumin: 3.9 g/dL (ref 3.5–5.2)
Alkaline Phosphatase: 100 U/L (ref 39–117)
BUN: 20 mg/dL (ref 6–23)
CO2: 25 mEq/L (ref 19–32)
Calcium: 9.3 mg/dL (ref 8.4–10.5)
Chloride: 107 mEq/L (ref 96–112)
Creatinine, Ser: 0.81 mg/dL (ref 0.40–1.20)
GFR: 66.66 mL/min (ref >60.00)
Glucose, Bld: 133 mg/dL — ABNORMAL HIGH (ref 70–99)
Potassium: 3.9 mEq/L (ref 3.5–5.1)
Sodium: 140 mEq/L (ref 135–145)
Total Bilirubin: 0.2 mg/dL (ref 0.2–1.2)
Total Protein: 7 g/dL (ref 6.0–8.3)

## 2020-06-24 LAB — LIPASE: Lipase: 28 U/L (ref 11.0–59.0)

## 2020-06-24 LAB — AMYLASE: Amylase: 30 U/L (ref 27–131)

## 2020-06-24 MED ORDER — OXYCODONE-ACETAMINOPHEN 7.5-325 MG PO TABS: 1.0000 | ORAL_TABLET | ORAL | 0 refills | Status: DC | PRN

## 2020-06-24 MED ORDER — OMEPRAZOLE 40 MG PO CPDR: 40.0000 mg | DELAYED_RELEASE_CAPSULE | Freq: Every day | ORAL | 1 refills | Status: DC

## 2020-06-24 NOTE — Patient Instructions (Addendum)
If you are age 84 or older, your body mass index should be between 23-30. Your Body mass index is 22.68 kg/m. If this is out of the aforementioned range listed, please consider follow up with your Primary Care Provider.  If you are age 71 or younger, your body mass index should be between 19-25. Your Body mass index is 22.68 kg/m. If this is out of the aformentioned range listed, please consider follow up with your Primary Care Provider.   Your provider has requested that you go to the basement level for lab work before leaving today. Press "B" on the elevator. The lab is located at the first door on the left as you exit the elevator.   While you are in the basement having your labs, please go to radiology and have an abdominal xray   We have sent the following medications to your pharmacy for you to pick up at your convenience: Omeprazole 40 mg   Take Miralax 1 capful mixed in 8 ounces of water at bed time for constipation as tolerated.   Due to recent changes in healthcare laws, you may see the results of your imaging and laboratory studies on MyChart before your provider has had a chance to review them.  We understand that in some cases there may be results that are confusing or concerning to you. Not all laboratory results come back in the same time frame and the provider may be waiting for multiple results in order to interpret others.  Please give [place] 48 hours in order for your provider to thoroughly review all the results before contacting the office for clarification of your results.

## 2020-06-24 NOTE — Progress Notes (Signed)
06/24/2020 Cynthia Young [id number] 1930/03/02   Chief Complaint: Constipation, upper abdominal pain   History of Present Illness: Cynthia Young is an 86 year old female with a past medical history of arthritis, second-degree A-V block Mobitz 1,  hypothyroidism and GERD. S/P cholecystectomy 04/11/2020 with + IOC s/p ERCP/enterotomy with a stone extraction from the common bile duct.  She was admitted to [facility] on 04/09/2020 with abdominal pain and loose stools.  An abdominal/pelvic CT identified pericholecystic fluid, intrahepatic biliary ductal dilatation, hepatic stenosis and mid sigmoid colitis versus early diverticulitis.  A right upper quadrant ultrasound identified cholelithiasis with gallbladder wall thickening and distention with 6 mm proximal CBD.  A HIDA scan showed acute cholecystitis and she subsequently underwent a laparoscopic cholecystectomy by Dr. Lucia Gaskins 04/11/2020.  During her surgery, acute and purulent discharge was noted and an intraoperative cholangiogram showed a filling defect in the common bile duct with contrast not extending to the small bowel.  She then underwent an ERCP by Dr. Rush Landmark 5/29 which resulted in a  sphincterotomy and removal of a common bile duct stone.  An EGD was done at the same time of the ERCP which identified a gastric ulcer.  She was advised to continue Omeprazole 40 mg twice daily for 8 weeks then daily and to repeat an EGD in 2 to 3 months.  Labs at the time of discharged 04/13/2020 showed alk phos level of 184, AST 77, ALT 81, total bili 0.9.  WBC 8.4.  Hemoglobin 11.0.  She was feeling relatively well post CCY surgery and ERCP until she fell at home fell and injured her back. She presented to [place] emergency room on 06/09/2020.  She also noted having upper abdominal pain for a about one week prior to her fall.  CBC and LFTs were normal. A CT  Scan of the spine showed a compression fracture to the T12.  An abdominal/pelvic  CT scan with contrast without acute findings in the abdomen or pelvis. The prior CT findings of sigmoid colitis vs diverticulitis was not present.  She was discharged home on hydrocodone/acetaminophen 5-325 mg 1 tab p.o. every 4 hours as needed.  She presents to our office today with complaints of constipation and upper abdominal pain that started approximately 1 week ago.  She continues to take hydrocodone/acetaminophen 1 tab twice daily.  No NSAIDs.  She is using MiraLAX once daily which has improved her constipation.  If she stops the MiraLAX she gets constipated again.  However, this morning she passed a softer to almost loose stool was urgent.  She slightly soiled herself before reaching the bathroom.  Her stool color is brown.  No melena or rectal bleeding.  She complains of having upper abdominal discomfort which she questions if this is triggered by her recent fall or sequela from her gallbladder surgery and stomach ulcer.  She continues to take omeprazole 40 mg twice daily.  No dysphagia or heartburn.  No nausea or vomiting.  She has lost 5 pounds over the past 6 weeks.  Her weight in our office today is 108 pounds.  86/21/2021 was 113 pounds.  No fever, sweats or chills. Brother with history of colon cancer.   CBC Latest Ref Rng & Units 06/09/2020 04/23/2020 04/13/2020  WBC 4.0 - 10.5 K/uL 8.5 9.3 8.4  Hemoglobin 12.0 - 15.0 g/dL 13.5 13.3 11.0(L)  Hematocrit 36 - 46 % 42.8 40.2 34.2(L)  Platelets 150 - 400 K/uL 247 362.0 253  CMP Latest Ref Rng & Units 06/09/2020 05/22/2020 05/05/2020  Glucose 70 - 99 mg/dL 120(H) 133(H) -  BUN 8 - 23 mg/dL 17 16 -  Creatinine 0.44 - 1.00 mg/dL 0.89 0.86 -  Sodium 135 - 145 mmol/L 141 140 -  Potassium 3.5 - 5.1 mmol/L 4.3 5.0 5.4 No hemolysis seen(H)  Chloride 98 - 111 mmol/L 104 105 -  CO2 22 - 32 mmol/L 26 28 -  Calcium 8.9 - 10.3 mg/dL 9.3 10.0 -  Total Protein 6.5 - 8.1 g/dL 7.0 - -  Total Bilirubin 0.3 - 1.2 mg/dL 0.6 - -  Alkaline Phos 38 - 126 U/L  74 - -  AST 15 - 41 U/L 21 - -  ALT 0 - 44 U/L 16 - -   CTAP 06/09/2020:  No acute findings in the abdomen or pelvis. Mild T12 compression fracture, new since previous study.  CTAP 04/09/2020: 1. Gallbladder appears distended with suggestion of pericholecystic fluid. Question mass versus accumulated sludge in the inferior gallbladder. Question a degree of acute cholecystitis. Advise correlation with ultrasound of the gallbladder to further evaluate. Slight intrahepatic biliary duct dilatation may be a consequence of the changes is in the gallbladder.  2. Colitis versus early diverticulitis in the mid sigmoid colon. No bowel inflammation elsewhere noted. No bowel obstruction. No abscess in the abdomen or pelvis. Appendix appears normal.  3. Extensive periuterine vascular calcification. Question pelvic congestion syndrome.  4.  Hepatic steatosis.  5.  Aortic Atherosclerosis    Past Medical History:  Diagnosis Date  . Arthritis   . Hyperlipidemia   . Hypothyroid      Current Outpatient Medications on File Prior to Visit  Medication Sig Dispense Refill  . levothyroxine (SYNTHROID) 50 MCG tablet Take 1 tablet (50 mcg total) by mouth daily. 90 tablet 1  . Polyethylene Glycol 3350 (MIRALAX PO) Take by mouth as needed.     No current facility-administered medications on file prior to visit.    No Known Allergies  Current Medications, Allergies, Past Medical History, Past Surgical History, Family History and Social History were reviewed in [employer] record.   Physical Exam: BP 140/60 (BP Location: Left Arm, Patient Position: Sitting, Cuff Size: Normal)   Pulse (!) 52 Comment: irregular  Ht 4' 10"  (1.473 m) Comment: height measured without shoes  Wt 108 lb 8 oz (49.2 kg)   BMI 22.68 kg/m  General: 86 year old female in no acute distress. Head: Normocephalic and atraumatic. Eyes: No scleral icterus. Conjunctiva pink . Ears: Normal auditory  acuity. Mouth: Dentition intact. No ulcers or lesions.  Lungs: Clear throughout to auscultation. Heart: Irregular rhythm, bradycardic.  Abdomen: Soft, mild gaseous distension. Nontender. No masses or hepatomegaly. Normal bowel sounds x 4 quadrants.  Rectal: Patient declined rectal exam to rule out fecal impaction (she was worried she would soil self while in our office). Extremities: No edema. Neurological: Alert oriented x 4.  Psychological: Alert and cooperative. Normal mood and affect  Assessment and Recommendations:  63.  86 year old female with upper abdominal pain.  Status post laparoscopic cholecystectomy with positive IOC 04/11/2020.  Status post ERCP and sphincterotomy with common bile duct stone extraction 04/12/2020. -CBC, CMP, lipase and amylase level (patient refuses to have her labs done today, she will consider returning to the lab in the next few days). -Continue omeprazole 40 mg twice daily for now.  If her upper abdominal discomfort resolves then Omeprazole to be reduced to 40 mg once daily. -Repeat  EGD mid to late September 2021 to assess healing of gastric ulcer.  Patient initially consented to schedule the procedure then preferred not to proceed with endoscopic evaluation. -Recommended follow up with Dr. Rush Landmark in the next 1 to 2 months (would be best if her family/daughter accompanied her to her follow up appointment). -Patient to call our office if her symptoms worsen  2.  Altered bowel pattern.  Constipation secondary to opioid use.   Somewhat looser stool today. CTAP 04/09/2020 showed possible sigmoid diverticulitis vs colitis, no mass. Repeat CTAP 06/09/2020 showed a normal colon.  -KUB to rule out stool filled colon (patient declined xray for today, she will complete in the next few day. -No plans for sigmoidoscopy at this time  3. T 12 compression fracture

## 2020-06-25 ENCOUNTER — Telehealth: Payer: Self-pay | Admitting: Family Medicine

## 2020-06-25 NOTE — Progress Notes (Signed)
Attending Physician's Attestation   I have reviewed the chart.   I agree with the Advanced Practitioner's note, impression, and recommendations with any updates as below. Endoscopic evaluation to follow-up ulcer disease is reasonable.  Patient's symptoms persist and in setting of prior question colitis on imaging, colonoscopic evaluation could be considered although with age 86 to try to minimize risks as much as possible.  Certainly, patient has ability to choose what she feels comfortable in regards to doing and further evaluation/exploration of her symptoms.  Happy to see her back in future.   Justice Britain, MD [facility] Office # [phone]

## 2020-06-25 NOTE — Telephone Encounter (Signed)
Left message for pharmacist to call back.

## 2020-06-25 NOTE — Telephone Encounter (Signed)
Per [facility], they received two prescriptions for the same medication. They would like to know what prescription to fill. One from [place] and the other for a DR in [place].  oxyCODONE-acetaminophen (PERCOCET) 7.5-325 MG tablet [[id number]]      Please advise

## 2020-06-26 NOTE — Telephone Encounter (Signed)
Patient went to neurosurgery and they wrote for hydrocodone and patient will just get the oxycodone written by Dr. Charlett Blake.

## 2020-06-30 ENCOUNTER — Telehealth: Payer: Self-pay | Admitting: General Surgery

## 2020-06-30 NOTE — Telephone Encounter (Signed)
-----   Message from Noralyn Pick, NP sent at 06/29/2020 11:15 AM EDT ----- Olivia Mackie, pls inform the patient her nonfasting glucose level was elevated, recheck fasting glucose level with PCP next few months. Her LFTs and pancreatic enzymes are fine. Note, I would recommend patient to schedule a follow up appt with Dr. Rush Landmark in the next 4 to 6 weeks accompanied by her daughter. Thx

## 2020-06-30 NOTE — Telephone Encounter (Signed)
Notified the patient of her labs, patient verbalized understanding and scheduled her appointment to follow up with Dr Rush Landmark.

## 2020-07-12 ENCOUNTER — Emergency Department (HOSPITAL_BASED_OUTPATIENT_CLINIC_OR_DEPARTMENT_OTHER)
Admission: EM | Admit: 2020-07-12 | Discharge: 2020-07-12 | Disposition: A | Discharge status: Home / Self Care | Payer: Medicare Other | Attending: Emergency Medicine | Admitting: Emergency Medicine

## 2020-07-12 ENCOUNTER — Other Ambulatory Visit: Payer: Self-pay

## 2020-07-12 ENCOUNTER — Encounter (HOSPITAL_BASED_OUTPATIENT_CLINIC_OR_DEPARTMENT_OTHER): Payer: Self-pay

## 2020-07-12 DIAGNOSIS — K047 Periapical abscess without sinus: Secondary | ICD-10-CM | POA: Insufficient documentation

## 2020-07-12 DIAGNOSIS — Z79899 Other long term (current) drug therapy: Secondary | ICD-10-CM | POA: Insufficient documentation

## 2020-07-12 DIAGNOSIS — J029 Acute pharyngitis, unspecified: Secondary | ICD-10-CM | POA: Diagnosis not present

## 2020-07-12 DIAGNOSIS — E039 Hypothyroidism, unspecified: Secondary | ICD-10-CM | POA: Insufficient documentation

## 2020-07-12 DIAGNOSIS — Z79890 Hormone replacement therapy (postmenopausal): Secondary | ICD-10-CM | POA: Insufficient documentation

## 2020-07-12 LAB — GROUP A STREP BY PCR: Group A Strep by PCR: NOT DETECTED

## 2020-07-12 MED ORDER — AMOXICILLIN-POT CLAVULANATE 875-125 MG PO TABS: 1.0000 | ORAL_TABLET | Freq: Two times a day (BID) | ORAL | 0 refills | Status: AC

## 2020-07-12 NOTE — ED Triage Notes (Signed)
Pt c/o sore throat x 4 days. Pt recently had dental surgery. Denies URI symptoms.

## 2020-07-12 NOTE — Discharge Instructions (Signed)
Your symptoms are most likely from a dental infection.  It is imperative that you speak to your dentist about this as we spoke about.  I want you to take the antibiotics as prescribed.  Please speak to your pharmacist about any interactions or side effects with this medication.  You can take Tylenol as prescribed on the bottle for pain.  Please come back to the emergency department for any new worsening concerning symptoms such as drooling, inability to move your neck, difficulty breathing. I hope you feel better!

## 2020-07-12 NOTE — ED Provider Notes (Signed)
[facility] EMERGENCY DEPARTMENT Provider Note   CSN: [id number] Arrival date & time: 07/12/20  1316     History Chief Complaint  Patient presents with  . Sore Throat    Cynthia Young is a 86 y.o. female with past medical history of arthritis, hyperlipidemia, hypothyroid that presents the emergency department today for sore throat for the past 4 days. Patient recently had dental procedure where Cynthia Young had tooth extracted last week, denies any immediate complications with that.  States that since then Cynthia Young has had sore throat on the side where Cynthia Young had her tooth extracted, left side along with left ear pain.  Denies any tinnitus, otorrhea, dizziness.  States that throat hurts at all times, not specifically when swallowing.  Has been able to eat and drink. Denies any trismus, facial swelling, drooling, odynophagia, trouble swallowing, trouble breathing..  Denies any URI-like symptoms including fevers, chills, congestion, ear pain, sinus pain, myalgias.  Denies any shortness of breath or chest pain.  Has been vaccinated for Covid.  Denies any sick contacts. HPI     Past Medical History:  Diagnosis Date  . Arthritis   . Hyperlipidemia   . Hypothyroid     Patient Active Problem List   Diagnosis Date Noted  . Acute cholecystitis 04/09/2020  . Pharyngitis 02/10/2020  . Varicose veins of both lower extremities 08/03/2019  . Hiatal hernia with GERD 03/16/2019  . Myalgia 09/25/2018  . Preventative health care 03/30/2018  . Hypothyroid   . Hyperlipidemia     Past Surgical History:  Procedure Laterality Date  . BACK SURGERY    . BIOPSY  04/12/2020   Procedure: BIOPSY;  Surgeon: Rush Landmark Telford Nab., MD;  Location: [facility];  Service: Gastroenterology;;  . Lillard Anes Left   . CHOLECYSTECTOMY N/A 04/11/2020   Procedure: LAPAROSCOPIC CHOLECYSTECTOMY WITH INTRAOPERATIVE CHOLANGIOGRAM;  Surgeon: Alphonsa Overall, MD;  Location: [facility];  Service: General;  Laterality: N/A;  .  ERCP N/A 04/12/2020   Procedure: ENDOSCOPIC RETROGRADE CHOLANGIOPANCREATOGRAPHY (ERCP);  Surgeon: Irving Copas., MD;  Location: [facility];  Service: Gastroenterology;  Laterality: N/A;  . FOOT SURGERY Right    bunion  . REMOVAL OF STONES  04/12/2020   Procedure: REMOVAL OF STONES;  Surgeon: Rush Landmark Telford Nab., MD;  Location: [facility];  Service: Gastroenterology;;  . [name]  04/12/2020   Procedure: [name];  Surgeon: Mansouraty, Telford Nab., MD;  Location: [facility];  Service: Gastroenterology;;     OB History    Gravida  3   Para  3   Term      Preterm      AB      Living        SAB      TAB      Ectopic      Multiple      Live Births              Family History  Problem Relation Age of Onset  . Cancer Mother   . Heart disease Father   . Arthritis Father        rheumatoid  . Cancer Brother        colon    Social History   Tobacco Use  . Smoking status: Never Smoker  . Smokeless tobacco: Never Used  Vaping Use  . Vaping Use: Never used  Substance Use Topics  . Alcohol use: Yes    Comment: socially  . Drug use: Never    Home Medications Prior to Admission medications  Medication Sig Start Date End Date Taking? Authorizing Provider  amoxicillin-clavulanate (AUGMENTIN) 875-125 MG tablet Take 1 tablet by mouth every 12 (twelve) hours for 7 days. 07/12/20 07/19/20  Alfredia Client, PA-C  levothyroxine (SYNTHROID) 50 MCG tablet Take 1 tablet (50 mcg total) by mouth daily. 03/03/20   Mosie Lukes, MD  omeprazole (PRILOSEC) 40 MG capsule Take 1 capsule (40 mg total) by mouth daily. 06/24/20   Noralyn Pick, NP  oxyCODONE-acetaminophen (PERCOCET) 7.5-325 MG tablet Take 1 tablet by mouth every 4 (four) hours as needed for severe pain. 06/24/20   Mosie Lukes, MD  Polyethylene Glycol 3350 (MIRALAX PO) Take by mouth as needed.    [provider]    Allergies    Patient has no known allergies.  Review  of Systems   Review of Systems  Constitutional: Negative for diaphoresis, fatigue and fever.  HENT: Positive for sore throat. Negative for congestion, dental problem, drooling, ear discharge, ear pain, facial swelling, hearing loss, mouth sores, sinus pressure, sinus pain, sneezing, tinnitus, trouble swallowing and voice change.   Eyes: Negative for visual disturbance.  Respiratory: Negative for shortness of breath.   Cardiovascular: Negative for chest pain.  Gastrointestinal: Negative for nausea and vomiting.  Musculoskeletal: Negative for back pain and myalgias.  Skin: Negative for color change, pallor, rash and wound.  Neurological: Negative for syncope, weakness, light-headedness, numbness and headaches.  Psychiatric/Behavioral: Negative for behavioral problems and confusion.    Physical Exam Updated Vital Signs BP (!) 171/74 (BP Location: Left Arm)   Pulse 62   Temp 98 F (36.7 C) (Oral)   Resp 18   Ht 4\' 10"  (1.473 m)   Wt 49 kg   SpO2 97%   BMI 22.57 kg/m   Physical Exam Constitutional:      General: Cynthia Young is not in acute distress.    Appearance: Normal appearance. Cynthia Young is not ill-appearing, toxic-appearing or diaphoretic.     Comments: Patient without acute respiratory stress.  Patient is sitting comfortably in bed, no tripoding, use of accessory muscles.  Patient is speaking to me in full sentences.  Handling secretions well.  HENT:     Head: Normocephalic and atraumatic.     Jaw: There is normal jaw occlusion. No trismus, tenderness, swelling, pain on movement or malocclusion.     Comments: No facial swelling noted.  No trismus.  No erythema or warmth around face.    Right Ear: Tympanic membrane and ear canal normal. No swelling or tenderness. No middle ear effusion. Tympanic membrane is not erythematous.     Left Ear: Tympanic membrane and ear canal normal. No drainage, swelling or tenderness.  No middle ear effusion. Tympanic membrane is not erythematous.     Nose: No  congestion or rhinorrhea.     Right Sinus: No maxillary sinus tenderness or frontal sinus tenderness.     Left Sinus: No maxillary sinus tenderness or frontal sinus tenderness.     Mouth/Throat:     Mouth: Mucous membranes are moist. No oral lesions.     Dentition: Normal dentition.     Tongue: No lesions.     Palate: No mass and lesions.     Pharynx: Oropharynx is clear. Uvula midline. No pharyngeal swelling, oropharyngeal exudate, posterior oropharyngeal erythema or uvula swelling.     Tonsils: No tonsillar exudate or tonsillar abscesses. 1+ on the right. 1+ on the left.      Comments: Tenderness in the area depicted above.  No abscess noted.  Patient without tonsillar enlargement or exudate.  No signs of peritonsillar abscess, palate without any tenderness or masses palpated.  No swelling under the tongue, uvula is midline without any inflammation. Eyes:     General: No visual field deficit.       Right eye: No discharge.        Left eye: No discharge.     Extraocular Movements: Extraocular movements intact.     Conjunctiva/sclera: Conjunctivae normal.     Pupils: Pupils are equal, round, and reactive to light.  Neck:     Comments: Is able to move neck in all directions without pain. Cardiovascular:     Rate and Rhythm: Normal rate and regular rhythm.     Pulses: Normal pulses.     Heart sounds: Normal heart sounds. No murmur heard.  No friction rub. No gallop.   Pulmonary:     Effort: Pulmonary effort is normal. No respiratory distress.     Breath sounds: Normal breath sounds. No stridor. No wheezing, rhonchi or rales.  Chest:     Chest wall: No tenderness.  Abdominal:     General: Abdomen is flat. Bowel sounds are normal. There is no distension.     Palpations: Abdomen is soft.     Tenderness: There is no abdominal tenderness. There is no right CVA tenderness or left CVA tenderness.  Musculoskeletal:        General: No swelling or tenderness. Normal range of motion.      Cervical back: Full passive range of motion without pain and normal range of motion. No rigidity or tenderness. No pain with movement. Normal range of motion.     Right lower leg: No edema.     Left lower leg: No edema.  Lymphadenopathy:     Cervical: No cervical adenopathy.  Skin:    General: Skin is warm and dry.     Capillary Refill: Capillary refill takes less than 2 seconds.     Findings: No erythema or rash.  Neurological:     General: No focal deficit present.     Mental Status: Cynthia Young is alert and oriented to person, place, and time.     Cranial Nerves: Cranial nerves are intact. No cranial nerve deficit or facial asymmetry.     Motor: Motor function is intact. No weakness.     Coordination: Coordination is intact.     Gait: Gait is intact. Gait normal.  Psychiatric:        Mood and Affect: Mood normal.     ED Results / Procedures / Treatments   Labs (all labs ordered are listed, but only abnormal results are displayed) Labs Reviewed  GROUP A STREP BY PCR    EKG None  Radiology No results found.  Procedures Procedures (including critical care time)  Medications Ordered in ED Medications - No data to display  ED Course  I have reviewed the triage vital signs and the nursing notes.  Pertinent labs & imaging results that were available during my care of the patient were reviewed by me and considered in my medical decision making (see chart for details).    MDM Rules/Calculators/A&P                         Gesenia Bantz is a 86 y.o. female with past medical history of arthritis, hyperlipidemia, hypothyroid that presents the emergency department today for sore throat for the past 4 days, physical exam benign.  Patient most likely  has sore throat from tooth infection, patient recently did have dental surgery.  Will prescribe Augmentin for patient, patient wants antibiotics.  No concerns for deep space infection, peritonsillar abscess, Ludwig's.  Patient is able to move  neck, no trismus, no drooling, afebrile.  Patient is well-appearing.  Discussed that it is imperative to follow-up with dentist on Monday, patient agreed.  Patient to be discharged.   Shared decision-making about further imaging and lab test, patient declined at this time states Cynthia Young will come back if symptoms worsen.  Doubt need for further emergent work up at this time. I explained the diagnosis and have given explicit precautions to return to the ER including for any other new or worsening symptoms. The patient understands and accepts the medical plan as it's been dictated and I have answered their questions. Discharge instructions concerning home care and prescriptions have been given. The patient is STABLE and is discharged to home in good condition.Did discuss the patient was hypertensive today needs to follow-up with primary care.  Patient in agreement.  Final Clinical Impression(s) / ED Diagnoses Final diagnoses:  Dental infection    Rx / DC Orders ED Discharge Orders         Ordered    amoxicillin-clavulanate (AUGMENTIN) 875-125 MG tablet  Every 12 hours        07/12/20 1606           Alfredia Client, PA-C 07/12/20 1651    Maudie Flakes, MD 07/12/20 2255

## 2020-07-14 ENCOUNTER — Telehealth: Payer: Self-pay

## 2020-07-14 NOTE — Telephone Encounter (Signed)
Patient evaluated/treated in ER for dental infection.    [facility] Night - Client TELEPHONE ADVICE RECORD AccessNurse Patient Name: LOUELLEN HALDEMAN Gender: Female DOB: [birth year] Age: 86 Y 64 M 20 D Return Phone Number: [phone] (Primary) Address: City/State/Zip: [place] [address] Client [facility] - Client Client Site [facility] - Night Physician Penni Homans - MD Contact Type Call Who Is Calling Patient / Member / Family / Caregiver Call Type Triage / Clinical Relationship To Patient Self Return Phone Number [phone] (Primary) Chief Complaint Sore Throat Reason for Call Symptomatic / Request for [place] states, pt has sore throat and wanting antibiotics. Translation No Nurse Assessment Nurse: Ronnald Ramp, RN, Miranda Date/Time (Eastern Time): 07/12/2020 12:37:41 PM Confirm and document reason for call. If symptomatic, describe symptoms. ---Caller states the side of her tongue and throat hurts when she swallows for 4 days. The pain radiates into her ear. She had a tooth pulled 1 week ago and had a bone graft put in to ready for an implant. Has the patient had close contact with a person known or suspected to have the novel coronavirus illness OR traveled / lives in area with major community spread (including international travel) in the last 14 days from the onset of symptoms? * If Asymptomatic, screen for exposure and travel within the last 14 days. ---No Does the patient have any new or worsening symptoms? ---Yes Will a triage be completed? ---Yes Related visit to physician within the last 2 weeks? ---No Does the PT have any chronic conditions? (i.e. diabetes, asthma, this includes High risk factors for pregnancy, etc.) ---Yes List chronic conditions. ---Thyroid, GERD, Is this a behavioral health or substance abuse call? ---No Guidelines Guideline Title Affirmed  Question Affirmed Notes Nurse Date/Time Eilene Ghazi Time) Sore Throat SEVERE (e.g., excruciating) throat pain Ronnald Ramp, RN, Miranda 07/12/2020 12:40:14 PM Disp. Time Eilene Ghazi Time) Disposition Final User 07/12/2020 12:59:26 PM See PCP within 24 Hours Yes Ronnald Ramp, RN, Miranda PLEASE NOTE: All timestamps contained within this report are represented as [place] Standard Time. CONFIDENTIALTY NOTICE: This fax transmission is intended only for the addressee. It contains information that is legally privileged, confidential or otherwise protected from use or disclosure. If you are not the intended recipient, you are strictly prohibited from reviewing, disclosing, copying using or disseminating any of this information or taking any action in reliance on or regarding this information. If you have received this fax in error, please notify us immediately by telephone so that we can arrange for its return to [place]. Phone: [phone], Toll-Free: [phone], Fax: [phone] Page: 2 of 2 Call Id: [id number] [place] Disagree/Comply Comply Caller Understands Yes PreDisposition Call Doctor Care Advice Given Per Guideline SEE PCP WITHIN 24 HOURS: * IF OFFICE WILL BE CLOSED: You need to be seen within the next 24 hours. A clinic or an urgent care center is often a good source of care if your doctor's office is closed or you can't get an appointment. CARE ADVICE given per Sore Throat (Adult) guideline. SORE THROAT: * Sip warm chicken broth or apple juice. PAIN AND FEVER MEDICINES: * For pain or fever relief, take either acetaminophen or ibuprofen. * They are over-the-counter (OTC) drugs that help treat both fever and pain. You can buy them at the drugstore. DRINK PLENTY LIQUIDS: * Drink plenty of liquids. This is important to prevent dehydration. * A healthy adult should drink 8 cups (240 ml) or more of liquid each  day. CALL BACK IF: * You become worse. Referrals GO TO FACILITY UNDECIDED

## 2020-07-18 ENCOUNTER — Emergency Department (HOSPITAL_BASED_OUTPATIENT_CLINIC_OR_DEPARTMENT_OTHER)
Admission: EM | Admit: 2020-07-18 | Discharge: 2020-07-18 | Disposition: A | Discharge status: Home / Self Care | Payer: Medicare Other | Attending: Emergency Medicine | Admitting: Emergency Medicine

## 2020-07-18 ENCOUNTER — Other Ambulatory Visit: Payer: Self-pay

## 2020-07-18 ENCOUNTER — Encounter (HOSPITAL_BASED_OUTPATIENT_CLINIC_OR_DEPARTMENT_OTHER): Payer: Self-pay | Admitting: *Deleted

## 2020-07-18 DIAGNOSIS — H9209 Otalgia, unspecified ear: Secondary | ICD-10-CM | POA: Diagnosis not present

## 2020-07-18 DIAGNOSIS — Z79899 Other long term (current) drug therapy: Secondary | ICD-10-CM | POA: Diagnosis not present

## 2020-07-18 DIAGNOSIS — E039 Hypothyroidism, unspecified: Secondary | ICD-10-CM | POA: Insufficient documentation

## 2020-07-18 DIAGNOSIS — Z79890 Hormone replacement therapy (postmenopausal): Secondary | ICD-10-CM | POA: Diagnosis not present

## 2020-07-18 DIAGNOSIS — R0602 Shortness of breath: Secondary | ICD-10-CM | POA: Insufficient documentation

## 2020-07-18 DIAGNOSIS — J029 Acute pharyngitis, unspecified: Secondary | ICD-10-CM

## 2020-07-18 MED ORDER — DEXAMETHASONE 6 MG PO TABS
10.0000 mg | ORAL_TABLET | Freq: Once | ORAL | Status: AC
  Administered 2020-07-18: 10 mg via ORAL
  Filled 2020-07-18: qty 1

## 2020-07-18 MED ORDER — AZITHROMYCIN 250 MG PO TABS
250.0000 mg | ORAL_TABLET | Freq: Every day | ORAL | 0 refills | Status: DC
Start: 2020-07-18 — End: 2020-09-04

## 2020-07-18 NOTE — Discharge Instructions (Signed)
Return for fever, swelling.  Finish your augmentin.  I prescribed you an antibiotic in case a couple days have occurred after finishing your antibiotic and you are having worse or persistent symptoms.

## 2020-07-18 NOTE — ED Provider Notes (Signed)
[facility] EMERGENCY DEPARTMENT Provider Note   CSN: [id number] Arrival date & time: 07/18/20  1559     History Chief Complaint  Patient presents with  . Sore Throat  . Ear Pain    Cynthia Young is a 86 y.o. female.  86 yo F with a chief complaint of a sore throat.  Going on for the past few days.  Started after she had a dental extraction.  Was seen in the ED and started on antibiotics.  She is followed up with her dentist who feels there is no dental pathology as the cause.  She is almost finished with her antibiotics and she is concerned that it being the long weekend she will have continued symptoms and need to reseek medical care.  States she has pain with swallowing.  Feels that the pain is now migrated to her left ear over the past 48 hours.  Was taking Augmentin.  She denies fevers.  Has chronic headaches that are unchanged.  The history is provided by the patient.  Sore Throat This is a new problem. The current episode started 2 days ago. The problem occurs constantly. The problem has not changed since onset.Associated symptoms include shortness of breath. Pertinent negatives include no chest pain, no abdominal pain and no headaches. Nothing aggravates the symptoms. Nothing relieves the symptoms. She has tried nothing for the symptoms. The treatment provided no relief.       Past Medical History:  Diagnosis Date  . Arthritis   . Hyperlipidemia   . Hypothyroid     Patient Active Problem List   Diagnosis Date Noted  . Acute cholecystitis 04/09/2020  . Pharyngitis 02/10/2020  . Varicose veins of both lower extremities 08/03/2019  . Hiatal hernia with GERD 03/16/2019  . Myalgia 09/25/2018  . Preventative health care 03/30/2018  . Hypothyroid   . Hyperlipidemia     Past Surgical History:  Procedure Laterality Date  . BACK SURGERY    . BIOPSY  04/12/2020   Procedure: BIOPSY;  Surgeon: Rush Landmark Telford Nab., MD;  Location: [facility];  Service:  Gastroenterology;;  . Lillard Anes Left   . CHOLECYSTECTOMY N/A 04/11/2020   Procedure: LAPAROSCOPIC CHOLECYSTECTOMY WITH INTRAOPERATIVE CHOLANGIOGRAM;  Surgeon: Alphonsa Overall, MD;  Location: [facility];  Service: General;  Laterality: N/A;  . ERCP N/A 04/12/2020   Procedure: ENDOSCOPIC RETROGRADE CHOLANGIOPANCREATOGRAPHY (ERCP);  Surgeon: Irving Copas., MD;  Location: [facility];  Service: Gastroenterology;  Laterality: N/A;  . FOOT SURGERY Right    bunion  . REMOVAL OF STONES  04/12/2020   Procedure: REMOVAL OF STONES;  Surgeon: Rush Landmark Telford Nab., MD;  Location: [facility];  Service: Gastroenterology;;  . [name]  04/12/2020   Procedure: [name];  Surgeon: Mansouraty, Telford Nab., MD;  Location: [facility];  Service: Gastroenterology;;     OB History    Gravida  3   Para  3   Term      Preterm      AB      Living        SAB      TAB      Ectopic      Multiple      Live Births              Family History  Problem Relation Age of Onset  . Cancer Mother   . Heart disease Father   . Arthritis Father        rheumatoid  . Cancer Brother  colon    Social History   Tobacco Use  . Smoking status: Never Smoker  . Smokeless tobacco: Never Used  Vaping Use  . Vaping Use: Never used  Substance Use Topics  . Alcohol use: Yes    Comment: socially  . Drug use: Never    Home Medications Prior to Admission medications   Medication Sig Start Date End Date Taking? Authorizing Provider  amoxicillin-clavulanate (AUGMENTIN) 875-125 MG tablet Take 1 tablet by mouth every 12 (twelve) hours for 7 days. 07/12/20 07/19/20  Alfredia Client, PA-C  azithromycin (ZITHROMAX) 250 MG tablet Take 1 tablet (250 mg total) by mouth daily. Take first 2 tablets together, then 1 every day until finished. 07/18/20   Deno Etienne, DO  levothyroxine (SYNTHROID) 50 MCG tablet Take 1 tablet (50 mcg total) by mouth daily. 03/03/20   Mosie Lukes, MD  omeprazole  (PRILOSEC) 40 MG capsule Take 1 capsule (40 mg total) by mouth daily. 06/24/20   Noralyn Pick, NP  oxyCODONE-acetaminophen (PERCOCET) 7.5-325 MG tablet Take 1 tablet by mouth every 4 (four) hours as needed for severe pain. 06/24/20   Mosie Lukes, MD  Polyethylene Glycol 3350 (MIRALAX PO) Take by mouth as needed.    [provider]    Allergies    Patient has no known allergies.  Review of Systems   Review of Systems  Constitutional: Negative for chills and fever.  HENT: Positive for ear pain. Negative for congestion and rhinorrhea.   Eyes: Negative for redness and visual disturbance.  Respiratory: Positive for shortness of breath. Negative for wheezing.   Cardiovascular: Negative for chest pain and palpitations.  Gastrointestinal: Negative for abdominal pain, nausea and vomiting.  Genitourinary: Negative for dysuria and urgency.  Musculoskeletal: Negative for arthralgias and myalgias.  Skin: Negative for pallor and wound.  Neurological: Negative for dizziness and headaches.    Physical Exam Updated Vital Signs BP (!) 174/90 (BP Location: Right Arm)   Pulse 82   Temp 98.3 F (36.8 C) (Oral)   Resp 20   Ht 4\' 10"  (1.473 m)   Wt 47.4 kg   SpO2 100%   BMI 21.84 kg/m   Physical Exam Vitals and nursing note reviewed.  Constitutional:      General: She is not in acute distress.    Appearance: She is well-developed. She is not diaphoretic.  HENT:     Head: Normocephalic and atraumatic.     Comments: Swollen turbinates, posterior nasal drip, no noted sinus ttp, left TM with a small serous effusion.  No bulging.  Mastoid normal without tenderness.  No obvious intraoral pathology.  She has a mild amount of erythema to the left lateral aspect of the tongue.  No sublingual swelling.  Uvula is midline.  Eyes:     Pupils: Pupils are equal, round, and reactive to light.  Cardiovascular:     Rate and Rhythm: Normal rate and regular rhythm.     Heart sounds: No  murmur heard.  No friction rub. No gallop.   Pulmonary:     Effort: Pulmonary effort is normal.     Breath sounds: No wheezing or rales.  Abdominal:     General: There is no distension.     Palpations: Abdomen is soft.     Tenderness: There is no abdominal tenderness.  Musculoskeletal:        General: No tenderness.     Cervical back: Normal range of motion and neck supple.  Skin:    General:  Skin is warm and dry.  Neurological:     Mental Status: She is alert and oriented to person, place, and time.  Psychiatric:        Behavior: Behavior normal.     ED Results / Procedures / Treatments   Labs (all labs ordered are listed, but only abnormal results are displayed) Labs Reviewed - No data to display  EKG None  Radiology No results found.  Procedures Procedures (including critical care time)  Medications Ordered in ED Medications  dexamethasone (DECADRON) tablet 10 mg (10 mg Oral Given 07/18/20 1950)    ED Course  I have reviewed the triage vital signs and the nursing notes.  Pertinent labs & imaging results that were available during my care of the patient were reviewed by me and considered in my medical decision making (see chart for details).    MDM Rules/Calculators/A&P                          86 yo F with a chief complaints of a sore throat and left ear pain.  Started after having a dental extraction.  I wonder if this is a viral illness that she is contracted.  She has done a course of antibiotics without significant change.  No obvious bacterial source on exam.  No edema.  Tolerating secretions here without difficulty.  Tongue p.o. without difficulty.  Will give a dose of Decadron here.  Prescribed a second course of antibiotics if she feels that she has failed 48 hours post completion of her Augmentin.  PCP follow-up.  7:50 PM:  I have discussed the diagnosis/risks/treatment options with the patient and believe the pt to be eligible for discharge home to  follow-up with PCP. We also discussed returning to the ED immediately if new or worsening sx occur. We discussed the sx which are most concerning (e.g., sudden worsening pain, fever, inability to tolerate by mouth) that necessitate immediate return. Medications administered to the patient during their visit and any new prescriptions provided to the patient are listed below.  Medications given during this visit Medications  dexamethasone (DECADRON) tablet 10 mg (10 mg Oral Given 07/18/20 1950)     The patient appears reasonably screen and/or stabilized for discharge and I doubt any other medical condition or other [facility] requiring further screening, evaluation, or treatment in the ED at this time prior to discharge.   Final Clinical Impression(s) / ED Diagnoses Final diagnoses:  Pharyngitis, unspecified etiology    Rx / DC Orders ED Discharge Orders         Ordered    azithromycin (ZITHROMAX) 250 MG tablet  Daily        07/18/20 1946           Deno Etienne, DO 07/18/20 1950

## 2020-07-18 NOTE — ED Triage Notes (Signed)
Sore throat for 4 days and left ear pain.  Was here  On the 28th with the same problem. Throat is very painful to drink and eat.

## 2020-08-04 ENCOUNTER — Telehealth: Payer: Self-pay | Admitting: Pharmacist

## 2020-08-04 NOTE — Progress Notes (Addendum)
° ° °  Chronic Care Management Pharmacy Assistant   Name: Cynthia Young  MRN: [id number] DOB: 08/27/1930  Reason for Encounter: Initial Questions   PCP : Mosie Lukes, MD  Allergies:  No Known Allergies  Medications: Outpatient Encounter Medications as of 08/04/2020  Medication Sig   azithromycin (ZITHROMAX) 250 MG tablet Take 1 tablet (250 mg total) by mouth daily. Take first 2 tablets together, then 1 every day until finished.   levothyroxine (SYNTHROID) 50 MCG tablet Take 1 tablet (50 mcg total) by mouth daily.   omeprazole (PRILOSEC) 40 MG capsule Take 1 capsule (40 mg total) by mouth daily.   oxyCODONE-acetaminophen (PERCOCET) 7.5-325 MG tablet Take 1 tablet by mouth every 4 (four) hours as needed for severe pain.   Polyethylene Glycol 3350 (MIRALAX PO) Take by mouth as needed.   No facility-administered encounter medications on file as of 08/04/2020.    Current Diagnosis: Patient Active Problem List   Diagnosis Date Noted   Acute cholecystitis 04/09/2020   Pharyngitis 02/10/2020   Varicose veins of both lower extremities 08/03/2019   Hiatal hernia with GERD 03/16/2019   Myalgia 09/25/2018   Preventative health care 03/30/2018   Hypothyroid    Hyperlipidemia     Goals Addressed   None    Have you seen any other providers since your last visit? Yes. Patient had an ED visit on 07-18-2020 for a sore throat. Patient stated she had a tooth extraction and was in excruciating pain for ten days. She then went to the ED for pain relief. She was prescribed Decadron 10 mg  Any changes in your medications or health? Yes. Patient has been experiencing a lot of different medical changes. Her gallbaldder was removed and then she had a fall. She recently lost her grandson in motor vehicle and buried him on 9-11. She states this is the worst thing that has happened to her and she is having a hard time dealing with it.  Any side effects from any medications? Not at this  time.  Do you have an symptoms or problems not managed by your medications? She stated there are things she has not being managed by medicine and she would like to discuss them at her appointment.  Any concerns about your health right now? Patient states she lost a lot of weight over the past few months. She has been taking Ensure as a supplement and would like to know if that is appropriate.  Has your provider asked that you check blood pressure, blood sugar, or follow special diet at home? No  Do you get any type of exercise on a regular basis? No  Can you think of a goal you would like to reach for your health? Patient would like to take the least amount of medication possible.  Do you have any problems getting your medications? No at this time  Is there anything that you would like to discuss during the appointment? No  Reminded the patient please have her medications and supplements available to during her telephone appointment on Thursday August 07, 2020 at 2:00pm.   Follow-Up:  Pharmacist Review   Fanny Skates, [place] Pharmacist Assistant [phone]  Reviewed by: De Blanch, PharmD Clinical Pharmacist [facility] [phone]

## 2020-08-05 NOTE — Chronic Care Management (AMB) (Signed)
Chronic Care Management Pharmacy  Name: Cynthia Young  MRN: [id number] DOB: 1930/09/22  Chief Complaint/ HPI  Cynthia Young,  86 y.Cynthia. , female presents for their Initial CCM visit with the clinical pharmacist via telephone due to COVID-19 Pandemic.  PCP : Cynthia Lukes, MD  Their chronic conditions include: Hypertension, Hyperlipidemia, Pre-Diabetes, Hypothyroidism, GERD  Office Visits: 04/23/20: Visit w/ Dr. Lorelei Young [facility] followup - Cholecystectomy by Dr. Lucia Young 04/11/20. Pt healing well.   02/28/20: Visit w/ Dr. Charlett Young - No med changes noted.  Consult Visit: 06/24/20: Cynthia Young visit  W/ Cynthia Best, NP - EGD showed gastric ulcer. Recommended to continue omeprazole 52m twice daily. Pt presents with abdominal pain. Gets constipation if she doesn't take miralax daily. If abdominal discomfort resolves omeprazole can be moved to once daily. Recommend follow up with Cynthia Young 1-2 months  05/05/20: Cardio visit w/ Cynthia Young- Hx of gallbladder surgery 03/2020. EKG shows bradycardia. She should avoid AV nodal agents moving forward. RTC in 1 year.  ED Visit:  07/18/20: [facility] - Pharyngitis. Pt completed course of abx without significant change. Prescribed decadron.   07/12/20: [facility] - Dental infection post extraction. Prescribed augmentin  Medications: Outpatient Encounter Medications as of 08/07/2020  Medication Sig  . levothyroxine (SYNTHROID) 50 MCG tablet Take 1 tablet (50 mcg total) by mouth daily.  .Marland Kitchenomeprazole (PRILOSEC) 40 MG capsule Take 1 capsule (40 mg total) by mouth daily.  . Polyethylene Glycol 3350 (MIRALAX PO) Take by mouth as needed.  .Marland Kitchenazithromycin (ZITHROMAX) 250 MG tablet Take 1 tablet (250 mg total) by mouth daily. Take first 2 tablets together, then 1 every Cynthia Young until finished.  . oxyCODONE-acetaminophen (PERCOCET) 7.5-325 MG tablet Take 1 tablet by mouth every 4 (four) hours as needed for severe pain. (Patient not taking:  Reported on 08/07/2020)   No facility-administered encounter medications on file as of 08/07/2020.   SDOH Screenings   Alcohol Screen:   . Last Alcohol Screening Score (AUDIT): Not on file  Depression (PHQ2-9): Low Risk   . PHQ-2 Score: 3  Financial Resource Strain: Low Risk   . Difficulty of Paying Living Expenses: Not very hard  Food Insecurity:   . Worried About Cynthia Young the Last Year: Not on file  . Ran Out of Food in the Last Year: Not on file  Housing:   . Last Housing Risk Score: Not on file  Physical Activity:   . Days of Exercise per Week: Not on file  . Minutes of Exercise per Session: Not on file  Social Connections:   . Frequency of Communication with Friends and Family: Not on file  . Frequency of Social Gatherings with Friends and Family: Not on file  . Attends Religious Services: Not on file  . Active Member of Clubs or Organizations: Not on file  . Attends CArchivistMeetings: Not on file  . Marital Status: Not on file  Stress:   . Feeling of Stress : Not on file  Tobacco Use: Low Risk   . Smoking Tobacco Use: Never Smoker  . Smokeless Tobacco Use: Never Used  Transportation Needs:   . LFilm/video editor(Medical): Not on file  . Lack of Transportation (Non-Medical): Not on file     Current Diagnosis/Assessment:  Goals Addressed            This Visit's Progress   . Chronic Care Management Pharmacy Care Plan  CARE PLAN ENTRY (see longitudinal plan of care for additional care plan information)  Current Barriers:  . Chronic Disease Management support, education, and care coordination needs related to Hypertension, Hyperlipidemia, Pre-Diabetes, Hypothyroidism, GERD   Hypertension Screening BP Readings from Last 3 Encounters:  07/18/20 (!) 174/90  07/12/20 (!) 171/74  06/24/20 140/60   . Pharmacist Clinical Goal(s): Cynthia Over the next 180 days, patient will work with PharmD and providers to achieve BP goal  <140/90 . Current regimen:  Cynthia Diet and exercise management   . Interventions: Cynthia Consider purchasing blood pressure cuff Cynthia Check blood pressure 2-3 times per week and record Cynthia Discussed BP goal . Patient self care activities - Over the next 180 days, patient will: Cynthia Check BP 2-3 times per, document, and provide at future appointments Cynthia Ensure daily salt intake < 2300 mg/Cynthia Young  Hyperlipidemia Lab Results  Component Value Date/Time   LDLCALC 134 (H) 02/28/2020 11:38 AM   . Pharmacist Clinical Goal(s): Cynthia Over the next 180 days, patient will work with PharmD and providers to achieve LDL goal < 100 . Current regimen:  Cynthia Diet and exercise management   . Interventions: Cynthia Discussed diet and exercise . Patient self care activities - Over the next 180 days, patient will: Cynthia Maintain a balanced diet  Pre-Diabetes Lab Results  Component Value Date/Time   HGBA1C 6.1 02/28/2020 11:38 AM   . Pharmacist Clinical Goal(s): Cynthia Over the next 180 days, patient will work with PharmD and providers to maintain A1c goal <6.5% . Current regimen:  Cynthia Diet and exercise management   . Interventions: Cynthia Discussed diet and exercise Cynthia Discussed A1c goals . Patient self care activities - Over the next 180 days, patient will: Cynthia Maintain a1c <6.5%  Health Maintenance  . Pharmacist Clinical Goal(s) Cynthia Over the next 1800 days, patient will work with PharmD and providers to complete health maintenance screenings/vaccinations . Interventions: Cynthia Discussed Flu vaccine Cynthia Discussed Pneumonia vaccine Cynthia Discussed Shingrix vaccine Cynthia Increase water intake to 3 bottles of water a Cynthia Young at least Cynthia Increase protein intake . Patient self care activities - Over the next 180 days, patient will: Cynthia Increase water intake to 3 bottles of water per Cynthia Young at least Cynthia Increase protein intake Cynthia Consider completing the following vaccines: . Flu . Pneumonia . Shingrix  Medication management . Pharmacist Clinical Goal(s): Cynthia Over the  next 180 days, patient will work with PharmD and providers to maintain optimal medication adherence . Current pharmacy: [facility] . Interventions Cynthia Comprehensive medication review performed. Cynthia Continue current medication management strategy . Patient self care activities - Over the next 180 days, patient will: Cynthia Focus on medication adherence by filling and taking medications appropriately  Cynthia Take medications as prescribed Cynthia Report any questions or concerns to PharmD and/or provider(s)  Initial goal documentation       Social Hx:  Lives alone.  Her 56 year old grandson was originally living with her while he was looking for a job. He served in Designer, television/film set. Lost grandson on Sept 6. He was hit by an oncoming car once his car crashed. Wakes up at 6:30am  Hypertension Screening   BP goal is:  <140/90  Office blood pressures are  BP Readings from Last 3 Encounters:  07/18/20 (!) 174/90  07/12/20 (!) 171/74  06/24/20 140/60   Patient checks BP at home never (does not have a cuff) Patient home BP readings are ranging: Unable to assess  Patient has failed these  meds in the past: None noted  Patient is currently uncontrolled on the following medications:  . None  She admits she gets very tearful when thinking about her grandson.  She becomes tearful while on the phone.  We discussed BP goals  Plan -Consider purchasing blood pressure cuff -Check blood pressure 2-3 times per week  Hyperlipidemia   LDL goal < 100  Lipid Panel     Component Value Date/Time   CHOL 204 (H) 02/28/2020 1138   TRIG 89.0 02/28/2020 1138   HDL 51.60 02/28/2020 1138   LDLCALC 134 (H) 02/28/2020 1138    Hepatic Function Latest Ref Rng & Units 06/24/2020 06/09/2020 04/23/2020  Total Protein 6.0 - 8.3 g/dL 7.0 7.0 7.2  Albumin 3.5 - 5.2 g/dL 3.9 3.8 3.9  AST 0 - 37 U/L 11 21 15   ALT 0 - 35 U/L 6 16 11   Alk Phosphatase 39 - 117 U/L 100 74 102  Total Bilirubin 0.2 - 1.2 mg/dL 0.2 0.6 0.4     The ASCVD  Risk score ([place]., et al., 2013) failed to calculate for the following reasons:   The 2013 ASCVD risk score is only valid for ages 19 to 32   Patient has failed these meds in past: None noted  Patient is currently uncontrolled on the following medications:  . None  Noting patient's age, would not recommend initiation of statin for primary prevention.  Diet Admits she doesn't drink a lot of water. #2 8oz bottles Wonders if she can drink ensure with her meals. States her son was concerned about her weight loss last time he visited her.  Admits that she has been eating a lot of bread lately. B -  1-2 eggs about 5 days a week; coffee and toast L - Tuna salad; Grilled cheese; Ham and cheese "I love cheese" D  - Pasta with vegetables Snacks - Nuts, ice cream "I'm trying to get away from the ice cream", crackers and cheese Drinks - Coffee, decaf tea, minimal water  Exercise  Walks around the house. Pulls weeds.  Varicose veins limit her movement.   Plan -Continue control with diet and exercise  Pre-Diabetes   A1c goal <6.5%  Recent Relevant Labs: Lab Results  Component Value Date/Time   HGBA1C 6.1 02/28/2020 11:38 AM   GFR 66.66 06/24/2020 04:20 PM   GFR 62.22 05/22/2020 01:55 PM    Patient has failed these meds in past: None noted  Patient is currently controlled on the following medications: . None  We discussed the importance of protein and vegetable intake.  Noting patient's age we discussed that she doesn't have to make major changes.  We discussed: diet and exercise extensively  Plan -Continue control with diet and exercise   Hypothyroidism   Lab Results  Component Value Date/Time   TSH 1.48 02/28/2020 11:38 AM   TSH 2.08 08/02/2019 11:41 AM    Patient has failed these meds in past: None noted  Patient is currently controlled on the following medications:  . Levothyroxine 51mg daily  She asks about her dose of medication.  She feels her skin is  very dry. She wonders what can be done about this. She is also concerned about hair loss. She associates this with anesthesia. Admits she doesn't drink a lot of water. "I drink water like it's poison". Reports she drinks about #2 8oz of water per Mellina Benison  We discussed:  Importance of water and protein intake  Plan -Continue current medications  -Increase  water intake to at least #3 bottles of water per Donne Robillard -Increase protein intake. Consider purchasing ensure with high protein content.  GERD   Patient has failed these meds in past: None noted  Patient is currently controlled on the following medications:  . Omeprazole 73m daily  Breakthrough Sx: Minimal Breakthrough Tx: N/A Triggers: Eating too much, acidic drinks (orange juice, coffee)  Plan -Continue current medications  Vaccines   Reviewed and discussed patient's vaccination history.    Immunization History  Administered Date(s) Administered  . Moderna SARS-COVID-2 Vaccination 01/14/2020, 02/22/2020    Plan -Recommended patient receive the following vaccines:  Flu vaccine in pharmacy or office  Pneumovax 23 in pharmacy or office  Shingrix in pharmacy  Medication Management   Pt uses [facility] pharmacy for all medications Uses pill Young? Yes Pt endorses 90% compliance  We discussed: Current pharmacy is preferred with insurance plan and patient is satisfied with pharmacy services  Plan -Continue current medication management strategy   Follow up: 3 month phone visit  KDe Blanch PharmD Clinical Pharmacist [facility]Primary [phone]

## 2020-08-07 ENCOUNTER — Ambulatory Visit: Payer: Medicare Other | Admitting: Pharmacist

## 2020-08-07 ENCOUNTER — Other Ambulatory Visit: Payer: Self-pay

## 2020-08-07 DIAGNOSIS — E785 Hyperlipidemia, unspecified: Secondary | ICD-10-CM

## 2020-08-07 DIAGNOSIS — Z0000 Encounter for general adult medical examination without abnormal findings: Secondary | ICD-10-CM

## 2020-08-07 DIAGNOSIS — R739 Hyperglycemia, unspecified: Secondary | ICD-10-CM

## 2020-08-07 NOTE — Patient Instructions (Addendum)
Visit Information  Goals Addressed            This Visit's Progress   . Chronic Care Management Pharmacy Care Plan       CARE PLAN ENTRY (see longitudinal plan of care for additional care plan information)  Current Barriers:  . Chronic Disease Management support, education, and care coordination needs related to Hypertension, Hyperlipidemia, Pre-Diabetes, Hypothyroidism, GERD   Hypertension Screening BP Readings from Last 3 Encounters:  07/18/20 (!) 174/90  07/12/20 (!) 171/74  06/24/20 140/60   . Pharmacist Clinical Goal(s): o Over the next 180 days, patient will work with PharmD and providers to achieve BP goal <140/90 . Current regimen:  o Diet and exercise management   . Interventions: o Consider purchasing blood pressure cuff o Check blood pressure 2-3 times per week and record o Discussed BP goal . Patient self care activities - Over the next 180 days, patient will: o Check BP 2-3 times per, document, and provide at future appointments o Ensure daily salt intake < 2300 mg/Cynthia Young  Hyperlipidemia Lab Results  Component Value Date/Time   LDLCALC 134 (H) 02/28/2020 11:38 AM   . Pharmacist Clinical Goal(s): o Over the next 180 days, patient will work with PharmD and providers to achieve LDL goal < 100 . Current regimen:  o Diet and exercise management   . Interventions: o Discussed diet and exercise . Patient self care activities - Over the next 180 days, patient will: o Maintain a balanced diet  Pre-Diabetes Lab Results  Component Value Date/Time   HGBA1C 6.1 02/28/2020 11:38 AM   . Pharmacist Clinical Goal(s): o Over the next 180 days, patient will work with PharmD and providers to maintain A1c goal <6.5% . Current regimen:  o Diet and exercise management   . Interventions: o Discussed diet and exercise o Discussed A1c goals . Patient self care activities - Over the next 180 days, patient will: o Maintain a1c <6.5%  Health Maintenance  . Pharmacist  Clinical Goal(s) o Over the next 1800 days, patient will work with PharmD and providers to complete health maintenance screenings/vaccinations . Interventions: o Discussed Flu vaccine o Discussed Pneumonia vaccine o Discussed Shingrix vaccine o Increase water intake to 3 bottles of water a Cynthia Young at least o Increase protein intake . Patient self care activities - Over the next 180 days, patient will: o Increase water intake to 3 bottles of water per Cynthia Young at least o Increase protein intake o Consider completing the following vaccines: . Flu . Pneumonia . Shingrix  Medication management . Pharmacist Clinical Goal(s): o Over the next 180 days, patient will work with PharmD and providers to maintain optimal medication adherence . Current pharmacy: [facility] . Interventions o Comprehensive medication review performed. o Continue current medication management strategy . Patient self care activities - Over the next 180 days, patient will: o Focus on medication adherence by filling and taking medications appropriately  o Take medications as prescribed o Report any questions or concerns to PharmD and/or provider(s)  Initial goal documentation        Cynthia Young was given information about Chronic Care Management services today including:  1. CCM service includes personalized support from designated clinical staff supervised by her physician, including individualized plan of care and coordination with other care providers 2. 24/7 contact phone numbers for assistance for urgent and routine care needs. 3. Standard insurance, coinsurance, copays and deductibles apply for chronic care management only during months in which we provide at least 20  minutes of these services. Most insurances cover these services at 100%, however patients may be responsible for any copay, coinsurance and/or deductible if applicable. This service may help you avoid the need for more expensive face-to-face  services. 4. Only one practitioner may furnish and bill the service in a calendar month. 5. The patient may stop CCM services at any time (effective at the end of the month) by phone call to the office staff.  Patient agreed to services and verbal consent obtained.   The patient verbalized understanding of instructions provided today and agreed to receive a mailed copy of patient instruction and/or educational materials. Telephone follow up appointment with pharmacy team member scheduled for: 10/30/2020   Cynthia Young Cynthia Young, PharmD Clinical Pharmacist [facility] [phone]   Healthy Eating Following a healthy eating pattern may help you to achieve and maintain a healthy body weight, reduce the risk of chronic disease, and live a long and productive life. It is important to follow a healthy eating pattern at an appropriate calorie level for your body. Your nutritional needs should be met primarily through food by choosing a variety of nutrient-rich foods. What are tips for following this plan? Reading food labels  Read labels and choose the following: ? Reduced or low sodium. ? Juices with 100% fruit juice. ? Foods with low saturated fats and high polyunsaturated and monounsaturated fats. ? Foods with whole grains, such as whole wheat, cracked wheat, brown rice, and wild rice. ? Whole grains that are fortified with folic acid. This is recommended for women who are pregnant or who want to become pregnant.  Read labels and avoid the following: ? Foods with a lot of added sugars. These include foods that contain brown sugar, corn sweetener, corn syrup, dextrose, fructose, glucose, high-fructose corn syrup, honey, invert sugar, lactose, malt syrup, maltose, molasses, raw sugar, sucrose, trehalose, or turbinado sugar.  Do not eat more than the following amounts of added sugar per Cynthia Young:  6 teaspoons (25 g) for women.  9 teaspoons (38 g) for men. ? Foods that  contain processed or refined starches and grains. ? Refined grain products, such as white flour, degermed cornmeal, white bread, and white rice. Shopping  Choose nutrient-rich snacks, such as vegetables, whole fruits, and nuts. Avoid high-calorie and high-sugar snacks, such as potato chips, fruit snacks, and candy.  Use oil-based dressings and spreads on foods instead of solid fats such as butter, stick margarine, or cream cheese.  Limit pre-made sauces, mixes, and "instant" products such as flavored rice, instant noodles, and ready-made pasta.  Try more plant-protein sources, such as tofu, tempeh, black beans, edamame, lentils, nuts, and seeds.  Explore eating plans such as the Mediterranean diet or vegetarian diet. Cooking  Use oil to saut or stir-fry foods instead of solid fats such as butter, stick margarine, or lard.  Try baking, boiling, grilling, or broiling instead of frying.  Remove the fatty part of meats before cooking.  Steam vegetables in water or broth. Meal planning   At meals, imagine dividing your plate into fourths: ? One-half of your plate is fruits and vegetables. ? One-fourth of your plate is whole grains. ? One-fourth of your plate is protein, especially lean meats, poultry, eggs, tofu, beans, or nuts.  Include low-fat dairy as part of your daily diet. Lifestyle  Choose healthy options in all settings, including home, work, school, restaurants, or stores.  Prepare your food safely: ? Wash your hands after handling raw meats. ?  Keep food preparation surfaces clean by regularly washing with hot, soapy water. ? Keep raw meats separate from ready-to-eat foods, such as fruits and vegetables. ? Cook seafood, meat, poultry, and eggs to the recommended internal temperature. ? Store foods at safe temperatures. In general:  Keep cold foods at 45F (4.4C) or below.  Keep hot foods at 145F (60C) or above.  Keep your freezer at [facility] (-17.8C) or  below.  Foods are no longer safe to eat when they have been between the temperatures of 40-145F (4.4-60C) for more than 2 hours. What foods should I eat? Fruits Aim to eat 2 cup-equivalents of fresh, canned (in natural juice), or frozen fruits each Haunani Dickard. Examples of 1 cup-equivalent of fruit include 1 small apple, 8 large strawberries, 1 cup canned fruit,  cup dried fruit, or 1 cup 100% juice. Vegetables Aim to eat 2-3 cup-equivalents of fresh and frozen vegetables each Weslee Fogg, including different varieties and colors. Examples of 1 cup-equivalent of vegetables include 2 medium carrots, 2 cups raw, leafy greens, 1 cup chopped vegetable (raw or cooked), or 1 medium baked potato. Grains Aim to eat 6 ounce-equivalents of whole grains each Kdyn Vonbehren. Examples of 1 ounce-equivalent of grains include 1 slice of bread, 1 cup ready-to-eat cereal, 3 cups popcorn, or  cup cooked rice, pasta, or cereal. Meats and other proteins Aim to eat 5-6 ounce-equivalents of protein each Shukri Nistler. Examples of 1 ounce-equivalent of protein include 1 egg, 1/2 cup nuts or seeds, or 1 tablespoon (16 g) peanut butter. A cut of meat or fish that is the size of a deck of cards is about 3-4 ounce-equivalents.  Of the protein you eat each week, try to have at least 8 ounces come from seafood. This includes salmon, trout, herring, and anchovies. Dairy Aim to eat 3 cup-equivalents of fat-free or low-fat dairy each Phinneas Shakoor. Examples of 1 cup-equivalent of dairy include 1 cup (240 mL) milk, 8 ounces (250 g) yogurt, 1 ounces (44 g) natural cheese, or 1 cup (240 mL) fortified soy milk. Fats and oils  Aim for about 5 teaspoons (21 g) per Trask Vosler. Choose monounsaturated fats, such as canola and olive oils, avocados, peanut butter, and most nuts, or polyunsaturated fats, such as sunflower, corn, and soybean oils, walnuts, pine nuts, sesame seeds, sunflower seeds, and flaxseed. Beverages  Aim for six 8-oz glasses of water per Hanya Guerin. Limit coffee to three  to five 8-oz cups per Yazmen Briones.  Limit caffeinated beverages that have added calories, such as soda and energy drinks.  Limit alcohol intake to no more than 1 drink a Troyce Gieske for nonpregnant women and 2 drinks a Rebekah Zackery for men. One drink equals 12 oz of beer (355 mL), 5 oz of wine (148 mL), or 1 oz of hard liquor (44 mL). Seasoning and other foods  Avoid adding excess amounts of salt to your foods. Try flavoring foods with herbs and spices instead of salt.  Avoid adding sugar to foods.  Try using oil-based dressings, sauces, and spreads instead of solid fats. This information is based on general [place] nutrition guidelines. For more information, visit [employer]. Exact amounts may vary based on your nutrition needs. Summary  A healthy eating plan may help you to maintain a healthy weight, reduce the risk of chronic diseases, and stay active throughout your life.  Plan your meals. Make sure you eat the right portions of a variety of nutrient-rich foods.  Try baking, boiling, grilling, or broiling instead of frying.  Choose healthy options in all  settings, including home, work, school, restaurants, or stores. This information is not intended to replace advice given to you by your health care provider. Make sure you discuss any questions you have with your health care provider. Document Revised: 02/13/2018 Document Reviewed: 02/13/2018 Elsevier Patient Education  [place].

## 2020-08-14 ENCOUNTER — Other Ambulatory Visit: Payer: Self-pay

## 2020-08-14 DIAGNOSIS — E785 Hyperlipidemia, unspecified: Secondary | ICD-10-CM

## 2020-08-14 DIAGNOSIS — E039 Hypothyroidism, unspecified: Secondary | ICD-10-CM

## 2020-08-14 DIAGNOSIS — K219 Gastro-esophageal reflux disease without esophagitis: Secondary | ICD-10-CM

## 2020-08-26 ENCOUNTER — Ambulatory Visit: Payer: Medicare Other | Admitting: Gastroenterology

## 2020-09-01 DIAGNOSIS — D1801 Hemangioma of skin and subcutaneous tissue: Secondary | ICD-10-CM | POA: Diagnosis not present

## 2020-09-01 DIAGNOSIS — L578 Other skin changes due to chronic exposure to nonionizing radiation: Secondary | ICD-10-CM | POA: Diagnosis not present

## 2020-09-01 DIAGNOSIS — L814 Other melanin hyperpigmentation: Secondary | ICD-10-CM | POA: Diagnosis not present

## 2020-09-01 DIAGNOSIS — L820 Inflamed seborrheic keratosis: Secondary | ICD-10-CM | POA: Diagnosis not present

## 2020-09-02 DIAGNOSIS — R030 Elevated blood-pressure reading, without diagnosis of hypertension: Secondary | ICD-10-CM | POA: Diagnosis not present

## 2020-09-02 DIAGNOSIS — S22080A Wedge compression fracture of T11-T12 vertebra, initial encounter for closed fracture: Secondary | ICD-10-CM | POA: Diagnosis not present

## 2020-09-04 ENCOUNTER — Encounter: Payer: Self-pay | Admitting: Family Medicine

## 2020-09-04 ENCOUNTER — Ambulatory Visit (INDEPENDENT_AMBULATORY_CARE_PROVIDER_SITE_OTHER): Payer: Medicare Other | Admitting: Family Medicine

## 2020-09-04 ENCOUNTER — Other Ambulatory Visit: Payer: Self-pay

## 2020-09-04 VITALS — BP 102/64 | HR 41 | Temp 97.7°F | Resp 16 | Wt 109.2 lb

## 2020-09-04 DIAGNOSIS — E785 Hyperlipidemia, unspecified: Secondary | ICD-10-CM

## 2020-09-04 DIAGNOSIS — R739 Hyperglycemia, unspecified: Secondary | ICD-10-CM | POA: Diagnosis not present

## 2020-09-04 DIAGNOSIS — E039 Hypothyroidism, unspecified: Secondary | ICD-10-CM | POA: Diagnosis not present

## 2020-09-04 DIAGNOSIS — E875 Hyperkalemia: Secondary | ICD-10-CM | POA: Diagnosis not present

## 2020-09-04 DIAGNOSIS — Z23 Encounter for immunization: Secondary | ICD-10-CM

## 2020-09-04 DIAGNOSIS — R109 Unspecified abdominal pain: Secondary | ICD-10-CM

## 2020-09-04 HISTORY — DX: Hyperglycemia, unspecified: R73.9

## 2020-09-04 NOTE — Patient Instructions (Signed)
Recommend calcium intake of 1200 to 1500 mg daily, divided into roughly 3 doses. Best source is the diet and a single dairy serving is about 500 mg, a supplement of calcium citrate once or twice daily to balance diet is fine if not getting enough in diet. Also need Vitamin D 2000 IU caps, 1 cap daily if not already taking vitamin D. Also recommend weight baring exercise on hips and upper body to keep bones strong 

## 2020-09-04 NOTE — Assessment & Plan Note (Signed)
hgba1c acceptable, minimize simple carbs. Increase exercise as tolerated.  

## 2020-09-04 NOTE — Assessment & Plan Note (Addendum)
Check vitamin D and cmp. Recommend calcium intake of 1200 to 1500 mg daily, divided into roughly 3 doses. Best source is the diet and a single dairy serving is about 500 mg, a supplement of calcium citrate once or twice daily to balance diet is fine if not getting enough in diet. Also need Vitamin D 2000 IU caps, 1 cap daily if not already taking vitamin D. Also recommend weight baring exercise on hips and upper body to keep bones strong

## 2020-09-04 NOTE — Assessment & Plan Note (Signed)
Encouraged heart healthy diet, increase exercise, avoid trans fats, consider a krill oil cap daily 

## 2020-09-05 ENCOUNTER — Encounter: Payer: Medicare Other | Admitting: Gastroenterology

## 2020-09-05 ENCOUNTER — Other Ambulatory Visit: Payer: Self-pay

## 2020-09-05 LAB — COMPREHENSIVE METABOLIC PANEL
AG Ratio: 1.5 (calc) (ref 1.0–2.5)
ALT: 12 U/L (ref 6–29)
AST: 16 U/L (ref 10–35)
Albumin: 4.3 g/dL (ref 3.6–5.1)
Alkaline phosphatase (APISO): 83 U/L (ref 37–153)
BUN: 15 mg/dL (ref 7–25)
CO2: 28 mmol/L (ref 20–32)
Calcium: 10.1 mg/dL (ref 8.6–10.4)
Chloride: 106 mmol/L (ref 98–110)
Creat: 0.79 mg/dL (ref 0.60–0.88)
Globulin: 2.8 g/dL (calc) (ref 1.9–3.7)
Glucose, Bld: 96 mg/dL (ref 65–99)
Potassium: 5.4 mmol/L — ABNORMAL HIGH (ref 3.5–5.3)
Sodium: 143 mmol/L (ref 135–146)
Total Bilirubin: 0.4 mg/dL (ref 0.2–1.2)
Total Protein: 7.1 g/dL (ref 6.1–8.1)

## 2020-09-05 LAB — CBC
HCT: 44.2 % (ref 35.0–45.0)
Hemoglobin: 14.3 g/dL (ref 11.7–15.5)
MCH: 29.8 pg (ref 27.0–33.0)
MCHC: 32.4 g/dL (ref 32.0–36.0)
MCV: 92.1 fL (ref 80.0–100.0)
MPV: 11.6 fL (ref 7.5–12.5)
Platelets: 258 10*3/uL (ref 140–400)
RBC: 4.8 10*6/uL (ref 3.80–5.10)
RDW: 12.3 % (ref 11.0–15.0)
WBC: 6.8 10*3/uL (ref 3.8–10.8)

## 2020-09-05 LAB — HEMOGLOBIN A1C
Hgb A1c MFr Bld: 5.9 % of total Hgb — ABNORMAL HIGH (ref <5.7)
Mean Plasma Glucose: 123 (calc)
eAG (mmol/L): 6.8 (calc)

## 2020-09-05 LAB — LIPID PANEL
Cholesterol: 217 mg/dL — ABNORMAL HIGH (ref <200)
HDL: 68 mg/dL (ref >=50)
LDL Cholesterol (Calc): 128 mg/dL (calc) — ABNORMAL HIGH
Non-HDL Cholesterol (Calc): 149 mg/dL (calc) — ABNORMAL HIGH (ref <130)
Total CHOL/HDL Ratio: 3.2 (calc) (ref <5.0)
Triglycerides: 106 mg/dL (ref <150)

## 2020-09-05 LAB — VITAMIN D 25 HYDROXY (VIT D DEFICIENCY, FRACTURES): Vit D, 25-Hydroxy: 21 ng/mL — ABNORMAL LOW (ref 30–100)

## 2020-09-05 LAB — TSH: TSH: 2.53 mIU/L (ref 0.40–4.50)

## 2020-09-05 MED ORDER — VITAMIN D (ERGOCALCIFEROL) 1.25 MG (50000 UNIT) PO CAPS: 50000.0000 [IU] | ORAL_CAPSULE | ORAL | 0 refills | Status: DC

## 2020-09-05 NOTE — Progress Notes (Signed)
Attempted to contact again no answer

## 2020-09-05 NOTE — Progress Notes (Signed)
Attempted to contact pt. LVM to call back

## 2020-09-06 ENCOUNTER — Other Ambulatory Visit: Payer: Self-pay | Admitting: Family Medicine

## 2020-09-07 DIAGNOSIS — E875 Hyperkalemia: Secondary | ICD-10-CM | POA: Insufficient documentation

## 2020-09-07 NOTE — Assessment & Plan Note (Signed)
Recurrent but asymptomatic, encouraged to minimize potassium intake in diet and increase hydration. Recheck and if still elevated may need a prescription for Kayexylate.

## 2020-09-07 NOTE — Assessment & Plan Note (Signed)
On Levothyroxine, continue to monitor 

## 2020-09-07 NOTE — Progress Notes (Signed)
Patient ID: Cynthia Young, female   DOB: January 22, 1930, 86 y.o.   MRN: [id number]   Subjective:    Patient ID: Cynthia Young, female    DOB: 03-19-1930, 86 y.o.   MRN: [id number]  Chief Complaint  Patient presents with  . Follow-up  . Hyperlipidemia    HPI Patient is in today for follow up on chronic medical concerns. No recent febrile illness. She is accompanied by her daughter and they report she is feeling much better now. No acute complaints. Denies CP/palp/SOB/HA/congestion/fevers/GI or GU c/o. Taking meds as prescribed  Past Medical History:  Diagnosis Date  . Arthritis   . Hyperglycemia 09/04/2020  . Hyperlipidemia   . Hypothyroid     Past Surgical History:  Procedure Laterality Date  . BACK SURGERY    . BIOPSY  04/12/2020   Procedure: BIOPSY;  Surgeon: Rush Landmark Telford Nab., MD;  Location: [facility];  Service: Gastroenterology;;  . Lillard Anes Left   . CHOLECYSTECTOMY N/A 04/11/2020   Procedure: LAPAROSCOPIC CHOLECYSTECTOMY WITH INTRAOPERATIVE CHOLANGIOGRAM;  Surgeon: Alphonsa Overall, MD;  Location: [facility];  Service: General;  Laterality: N/A;  . ERCP N/A 04/12/2020   Procedure: ENDOSCOPIC RETROGRADE CHOLANGIOPANCREATOGRAPHY (ERCP);  Surgeon: Irving Copas., MD;  Location: [facility];  Service: Gastroenterology;  Laterality: N/A;  . FOOT SURGERY Right    bunion  . REMOVAL OF STONES  04/12/2020   Procedure: REMOVAL OF STONES;  Surgeon: Rush Landmark Telford Nab., MD;  Location: [facility];  Service: Gastroenterology;;  . Cynthia Young  04/12/2020   Procedure: Cynthia Young;  Surgeon: Mansouraty, Telford Nab., MD;  Location: [facility];  Service: Gastroenterology;;    Family History  Problem Relation Age of Onset  . Cancer Mother   . Heart disease Father   . Arthritis Father        rheumatoid  . Cancer Brother        colon    Social History   Socioeconomic History  . Marital status: Widowed    Spouse name: Not on file  . Number of children: Not on  file  . Years of education: Not on file  . Highest education level: Not on file  Occupational History  . Not on file  Tobacco Use  . Smoking status: Never Smoker  . Smokeless tobacco: Never Used  Vaping Use  . Vaping Use: Never used  Substance and Sexual Activity  . Alcohol use: Yes    Comment: socially  . Drug use: Never  . Sexual activity: Not Currently  Other Topics Concern  . Not on file  Social History Narrative   worked in [employer], short time. No cigarettes or drug use. Live by self no dietary restictions and walks daily   Social Determinants of Health   Financial Resource Strain: Low Risk   . Difficulty of Paying Living Expenses: Not very hard  Food Insecurity:   . Worried About Charity fundraiser in the Last Year: Not on file  . Ran Out of Food in the Last Year: Not on file  Transportation Needs:   . Lack of Transportation (Medical): Not on file  . Lack of Transportation (Non-Medical): Not on file  Physical Activity:   . Days of Exercise per Week: Not on file  . Minutes of Exercise per Session: Not on file  Stress:   . Feeling of Stress : Not on file  Social Connections:   . Frequency of Communication with Friends and Family: Not on file  . Frequency of Social Gatherings with Friends and  Family: Not on file  . Attends Religious Services: Not on file  . Active Member of Clubs or Organizations: Not on file  . Attends Archivist Meetings: Not on file  . Marital Status: Not on file  Intimate Partner Violence:   . Fear of Current or Ex-Partner: Not on file  . Emotionally Abused: Not on file  . Physically Abused: Not on file  . Sexually Abused: Not on file    Outpatient Medications Prior to Visit  Medication Sig Dispense Refill  . levothyroxine (SYNTHROID) 50 MCG tablet Take 1 tablet (50 mcg total) by mouth daily. 90 tablet 1  . omeprazole (PRILOSEC) 40 MG capsule Take 1 capsule (40 mg total) by mouth daily. 60 capsule 1  . azithromycin  (ZITHROMAX) 250 MG tablet Take 1 tablet (250 mg total) by mouth daily. Take first 2 tablets together, then 1 every day until finished. 6 tablet 0  . oxyCODONE-acetaminophen (PERCOCET) 7.5-325 MG tablet Take 1 tablet by mouth every 4 (four) hours as needed for severe pain. (Patient not taking: Reported on 08/07/2020) 30 tablet 0  . Polyethylene Glycol 3350 (MIRALAX PO) Take by mouth as needed.     No facility-administered medications prior to visit.    No Known Allergies  Review of Systems  Constitutional: Negative for fever and malaise/fatigue.  HENT: Negative for congestion.   Eyes: Negative for blurred vision.  Respiratory: Negative for shortness of breath.   Cardiovascular: Negative for chest pain, palpitations and leg swelling.  Gastrointestinal: Negative for abdominal pain, blood in stool and nausea.  Genitourinary: Negative for dysuria and frequency.  Musculoskeletal: Negative for falls.  Skin: Negative for rash.  Neurological: Negative for dizziness, loss of consciousness and headaches.  Endo/Heme/Allergies: Negative for environmental allergies.  Psychiatric/Behavioral: Negative for depression. The patient is not nervous/anxious.        Objective:    Physical Exam Vitals and nursing note reviewed.  Constitutional:      General: She is not in acute distress.    Appearance: She is well-developed.  HENT:     Head: Normocephalic and atraumatic.     Nose: Nose normal.  Eyes:     General:        Right eye: No discharge.        Left eye: No discharge.  Cardiovascular:     Rate and Rhythm: Normal rate and regular rhythm.     Heart sounds: No murmur heard.   Pulmonary:     Effort: Pulmonary effort is normal.     Breath sounds: Normal breath sounds.  Abdominal:     General: Bowel sounds are normal.     Palpations: Abdomen is soft.     Tenderness: There is no abdominal tenderness.  Musculoskeletal:     Cervical back: Normal range of motion and neck supple.  Skin:     General: Skin is warm and dry.  Neurological:     Mental Status: She is alert and oriented to person, place, and time.     BP 102/64 (BP Location: Left Arm)   Pulse (!) 41   Temp 97.7 F (36.5 C)   Resp 16   Wt 109 lb 3.2 oz (49.5 kg)   SpO2 97%   BMI 22.82 kg/m  Wt Readings from Last 3 Encounters:  09/04/20 109 lb 3.2 oz (49.5 kg)  07/18/20 104 lb 8 oz (47.4 kg)  07/12/20 108 lb (49 kg)    Diabetic Foot Exam - Simple   No data filed  Lab Results  Component Value Date   WBC 6.8 09/04/2020   HGB 14.3 09/04/2020   HCT 44.2 09/04/2020   PLT 258 09/04/2020   GLUCOSE 96 09/04/2020   CHOL 217 (H) 09/04/2020   TRIG 106 09/04/2020   HDL 68 09/04/2020   LDLCALC 128 (H) 09/04/2020   ALT 12 09/04/2020   AST 16 09/04/2020   NA 143 09/04/2020   K 5.4 (H) 09/04/2020   CL 106 09/04/2020   CREATININE 0.79 09/04/2020   BUN 15 09/04/2020   CO2 28 09/04/2020   TSH 2.53 09/04/2020   HGBA1C 5.9 (H) 09/04/2020    Lab Results  Component Value Date   TSH 2.53 09/04/2020   Lab Results  Component Value Date   WBC 6.8 09/04/2020   HGB 14.3 09/04/2020   HCT 44.2 09/04/2020   MCV 92.1 09/04/2020   PLT 258 09/04/2020   Lab Results  Component Value Date   NA 143 09/04/2020   K 5.4 (H) 09/04/2020   CO2 28 09/04/2020   GLUCOSE 96 09/04/2020   BUN 15 09/04/2020   CREATININE 0.79 09/04/2020   BILITOT 0.4 09/04/2020   ALKPHOS 100 06/24/2020   AST 16 09/04/2020   ALT 12 09/04/2020   PROT 7.1 09/04/2020   ALBUMIN 3.9 06/24/2020   CALCIUM 10.1 09/04/2020   ANIONGAP 11 06/09/2020   GFR 66.66 06/24/2020   Lab Results  Component Value Date   CHOL 217 (H) 09/04/2020   Lab Results  Component Value Date   HDL 68 09/04/2020   Lab Results  Component Value Date   LDLCALC 128 (H) 09/04/2020   Lab Results  Component Value Date   TRIG 106 09/04/2020   Lab Results  Component Value Date   CHOLHDL 3.2 09/04/2020   Lab Results  Component Value Date   HGBA1C 5.9 (H)  09/04/2020       Assessment & Plan:   Problem List Items Addressed This Visit    Hypothyroid    On Levothyroxine, continue to monitor      Relevant Orders   TSH (Completed)   TSH   Hyperlipidemia    Encouraged heart healthy diet, increase exercise, avoid trans fats, consider a krill oil cap daily      Relevant Orders   CBC (Completed)   Lipid panel (Completed)   Lipid panel   Hyperglycemia    hgba1c acceptable, minimize simple carbs. Increase exercise as tolerated.      Relevant Orders   Hemoglobin A1c (Completed)   CBC (Completed)   Comprehensive metabolic panel (Completed)   TSH (Completed)   Hemoglobin A1c   Comprehensive metabolic panel   Hypocalcemia    Check vitamin D and cmp. Recommend calcium intake of 1200 to 1500 mg daily, divided into roughly 3 doses. Best source is the diet and a single dairy serving is about 500 mg, a supplement of calcium citrate once or twice daily to balance diet is fine if not getting enough in diet. Also need Vitamin D 2000 IU caps, 1 cap daily if not already taking vitamin D. Also recommend weight baring exercise on hips and upper body to keep bones strong      Relevant Orders   CBC (Completed)   Comprehensive metabolic panel (Completed)   VITAMIN D 25 Hydroxy (Vit-D Deficiency, Fractures) (Completed)   VITAMIN D 25 Hydroxy (Vit-D Deficiency, Fractures)   Hyperkalemia    Recurrent but asymptomatic, encouraged to minimize potassium intake in diet and increase hydration. Recheck and if still  elevated may need a prescription for Kayexylate.        Other Visit Diagnoses    Need for influenza vaccination    -  Primary   Relevant Orders   Flu Vaccine QUAD High Dose(Fluad) (Completed)   Abdominal pain, unspecified abdominal location       Relevant Orders   CBC (Completed)   CBC      I have discontinued Nikiyah Yandell's Polyethylene Glycol 3350 (MIRALAX PO), oxyCODONE-acetaminophen, and azithromycin. I am also having her maintain  her levothyroxine and omeprazole.  No orders of the defined types were placed in this encounter.    Penni Homans, MD

## 2020-09-19 ENCOUNTER — Ambulatory Visit (INDEPENDENT_AMBULATORY_CARE_PROVIDER_SITE_OTHER): Payer: Medicare Other | Admitting: Gastroenterology

## 2020-09-19 ENCOUNTER — Encounter: Payer: Self-pay | Admitting: Gastroenterology

## 2020-09-19 VITALS — BP 110/80 | HR 65 | Ht <= 58 in | Wt 111.0 lb

## 2020-09-19 DIAGNOSIS — Z9889 Other specified postprocedural states: Secondary | ICD-10-CM

## 2020-09-19 DIAGNOSIS — K259 Gastric ulcer, unspecified as acute or chronic, without hemorrhage or perforation: Secondary | ICD-10-CM | POA: Diagnosis not present

## 2020-09-19 DIAGNOSIS — K8050 Calculus of bile duct without cholangitis or cholecystitis without obstruction: Secondary | ICD-10-CM | POA: Diagnosis not present

## 2020-09-19 DIAGNOSIS — R12 Heartburn: Secondary | ICD-10-CM | POA: Diagnosis not present

## 2020-09-19 DIAGNOSIS — R933 Abnormal findings on diagnostic imaging of other parts of digestive tract: Secondary | ICD-10-CM | POA: Diagnosis not present

## 2020-09-19 NOTE — Patient Instructions (Signed)
1. Decrease your Omeprazole to 20mg  once daily. If You do not wish to continue Omeprazole please consider using over the counter Pepcid             20mg  as needed.   2. It has been recommended to you by your physician that you have a(n) EGD completed. Per your request, we did not schedule the procedure(s) today. Please contact our office in 1-2 weeks at [phone] should you decide to have the procedure completed. You will be scheduled for a pre-visit and procedure at that time.   3. If you are age 23 or older, your body mass index should be between 23-30. Your Body mass index is 23.2 kg/m. If this is out of the aforementioned range listed, please consider follow up with your Primary Care Provider.  If you are age 47 or younger, your body mass index should be between 19-25. Your Body mass index is 23.2 kg/m. If this is out of the aformentioned range listed, please consider follow up with your Primary Care Provider.    Thank you for choosing me and [facility].  Dr. Rush Landmark

## 2020-09-20 ENCOUNTER — Encounter: Payer: Self-pay | Admitting: Gastroenterology

## 2020-09-20 DIAGNOSIS — K259 Gastric ulcer, unspecified as acute or chronic, without hemorrhage or perforation: Secondary | ICD-10-CM | POA: Insufficient documentation

## 2020-09-20 DIAGNOSIS — K8050 Calculus of bile duct without cholangitis or cholecystitis without obstruction: Secondary | ICD-10-CM | POA: Insufficient documentation

## 2020-09-20 DIAGNOSIS — R933 Abnormal findings on diagnostic imaging of other parts of digestive tract: Secondary | ICD-10-CM | POA: Insufficient documentation

## 2020-09-20 DIAGNOSIS — R12 Heartburn: Secondary | ICD-10-CM | POA: Insufficient documentation

## 2020-09-20 DIAGNOSIS — Z9889 Other specified postprocedural states: Secondary | ICD-10-CM | POA: Insufficient documentation

## 2020-09-20 NOTE — Progress Notes (Signed)
[place] VISIT   Primary Care Provider Mosie Lukes, [place] [address] [place] [address] [phone]  Patient Profile: Cynthia Young is a 86 y.o. female with a pmh significant for hypothyroidism, status post cholecystectomy (status post ERCP for choledocholithiasis), peptic ulcer disease manifested as gastric ulcer, GERD.  The patient presents to the [facility] for an evaluation and management of problem(s) noted below:  Problem List 1. Pyrosis   2. Gastric ulcer without hemorrhage or perforation, unspecified chronicity   3. History of choledocholithiasis   4. History of ERCP   5. Abnormal CT scan, sigmoid colon     History of Present Illness Please see initial inpatient consultation note as well as prior progress notes by NP [facility] for full details of HPI.  Interval History The patient returns for scheduled follow-up.  The patient is accompanied by her daughter.  The patient states that she is doing well overall.  She does have heartburn symptoms that come and go.  She has stopped taking PPI as she ran out of the prescription.  While she is been off her PPI she has had more GERD symptoms slightly than normal.  She has a discomfort in the midportion of her chest which is not necessarily burning she wonders if she has a hiatal hernia could be playing some issues.  The issues of her lower abdominal discomfort have resolved.  Unfortunately she had been in a trauma that led to a CT scan in the summer of this year.  At that time there was no evidence of any persisting colitis issues.  Patient states she has bowel movements without any blood.  They are formed.  She denies significant diarrhea.  Patient has had a colonoscopy previously but has been years.  There is a family history of colon cancer as documented in the family history.  She is not excited about anesthesia related events again because she is concerned that it  will cause issues with hair loss.  GI Review of Systems Positive as above Negative for dysphagia odynophagia melena, hematochezia, change in bowel habits  Review of Systems General: Denies fevers/chills/weight loss unintentionally at this time Cardiovascular: Denies current chest pain/palpitations Pulmonary: Denies shortness of breath/nocturnal cough Gastroenterological: See HPI Genitourinary: Denies darkened urine Hematological: Positive for history of easy bruising/bleeding Dermatological: Denies jaundice Psychological: Mood is stable  Medications Current Outpatient Medications  Medication Sig Dispense Refill  . levothyroxine (SYNTHROID) 50 MCG tablet TAKE ONE TABLET BY MOUTH ONE TIME DAILY 90 tablet 1  . omeprazole (PRILOSEC) 40 MG capsule Take 1 capsule (40 mg total) by mouth daily. 60 capsule 1  . Vitamin D, Ergocalciferol, (DRISDOL) 1.25 MG (50000 UNIT) CAPS capsule Take 1 capsule (50,000 Units total) by mouth every 7 (seven) days. 12 capsule 0   No current facility-administered medications for this visit.    Allergies No Known Allergies  Histories Past Medical History:  Diagnosis Date  . Arthritis   . Hyperglycemia 09/04/2020  . Hyperlipidemia   . Hypothyroid    Past Surgical History:  Procedure Laterality Date  . BACK SURGERY    . BIOPSY  04/12/2020   Procedure: BIOPSY;  Surgeon: Rush Landmark Telford Nab., MD;  Location: [facility];  Service: Gastroenterology;;  . Lillard Anes Left   . CHOLECYSTECTOMY N/A 04/11/2020   Procedure: LAPAROSCOPIC CHOLECYSTECTOMY WITH INTRAOPERATIVE CHOLANGIOGRAM;  Surgeon: Alphonsa Overall, MD;  Location: [facility];  Service: General;  Laterality: N/A;  . ERCP N/A 04/12/2020   Procedure:  ENDOSCOPIC RETROGRADE CHOLANGIOPANCREATOGRAPHY (ERCP);  Surgeon: Irving Copas., MD;  Location: [facility];  Service: Gastroenterology;  Laterality: N/A;  . FOOT SURGERY Right    bunion  . REMOVAL OF STONES  04/12/2020   Procedure: REMOVAL OF  STONES;  Surgeon: Rush Landmark Telford Nab., MD;  Location: [facility];  Service: Gastroenterology;;  . [name]  04/12/2020   Procedure: [name];  Surgeon: Rush Landmark Telford Nab., MD;  Location: [facility];  Service: Gastroenterology;;   Social History   Socioeconomic History  . Marital status: Widowed    Spouse name: Not on file  . Number of children: Not on file  . Years of education: Not on file  . Highest education level: Not on file  Occupational History  . Not on file  Tobacco Use  . Smoking status: Never Smoker  . Smokeless tobacco: Never Used  Vaping Use  . Vaping Use: Never used  Substance and Sexual Activity  . Alcohol use: Yes    Comment: socially  . Drug use: Never  . Sexual activity: Not Currently  Other Topics Concern  . Not on file  Social History Narrative   worked in [employer], short time. No cigarettes or drug use. Live by self no dietary restictions and walks daily   Social Determinants of Health   Financial Resource Strain: Low Risk   . Difficulty of Paying Living Expenses: Not very hard  Food Insecurity:   . Worried About Charity fundraiser in the Last Year: Not on file  . Ran Out of Food in the Last Year: Not on file  Transportation Needs:   . Lack of Transportation (Medical): Not on file  . Lack of Transportation (Non-Medical): Not on file  Physical Activity:   . Days of Exercise per Week: Not on file  . Minutes of Exercise per Session: Not on file  Stress:   . Feeling of Stress : Not on file  Social Connections:   . Frequency of Communication with Friends and Family: Not on file  . Frequency of Social Gatherings with Friends and Family: Not on file  . Attends Religious Services: Not on file  . Active Member of Clubs or Organizations: Not on file  . Attends Archivist Meetings: Not on file  . Marital Status: Not on file  Intimate Partner Violence:   . Fear of Current or Ex-Partner: Not on file  . Emotionally  Abused: Not on file  . Physically Abused: Not on file  . Sexually Abused: Not on file   Family History  Problem Relation Age of Onset  . Cancer Mother   . Heart disease Father   . Arthritis Father        rheumatoid  . Cancer Brother        colon  . Colon cancer Brother   . Esophageal cancer Neg Hx   . Inflammatory bowel disease Neg Hx   . Liver disease Neg Hx   . Pancreatic cancer Neg Hx   . Stomach cancer Neg Hx    I have reviewed her medical, social, and family history in detail and updated the electronic medical record as necessary.    PHYSICAL EXAMINATION  BP 110/80   Pulse 65   Ht 4\' 10"  (1.473 m)   Wt 111 lb (50.3 kg)   SpO2 97%   BMI 23.20 kg/m  Wt Readings from Last 3 Encounters:  09/19/20 111 lb (50.3 kg)  09/04/20 109 lb 3.2 oz (49.5 kg)  07/18/20 104 lb  8 oz (47.4 kg)  GEN: NAD, appears stated age, doesn't appear chronically ill, accompanied by daughter PSYCH: Cooperative, without pressured speech EYE: Conjunctivae pink, sclerae anicteric ENT: MMM CV: Nontachycardic RESP: No audible wheezing GI: Soft, nondistended, no rebound or guarding  MSK/EXT: No significant lower extremity edema SKIN: No jaundice NEURO:  Alert & Oriented x 3, no focal deficits   REVIEW OF DATA  I reviewed the following data at the time of this encounter:  GI Procedures and Studies  May 2021 ERCP - Gastritis. Non-bleeding gastric ulcer with a clean ulcer base (Forrest Class III). Biopsied for H. pylori evaluation. - Erythematous duodenopathy. - The major papilla appeared to be small. - A filling defect consistent with a stone was seen on the cholangiogram. - The upper third of the main bile duct and middle third of the main bile duct were mildly dilated. - Choledocholithiasis was found. Complete removal was accomplished by biliary sphincterotomy and balloon sweep.  Laboratory Studies  Reviewed those in epic  Imaging Studies  July 2021 CT abdomen pelvis with  contrast IMPRESSION: No acute findings in the abdomen or pelvis. Mild T12 compression fracture, new since previous study.  May 2021 CT abdomen pelvis with contrast IMPRESSION: 1. Gallbladder appears distended with suggestion of pericholecystic fluid. Question mass versus accumulated sludge in the inferior gallbladder. Question a degree of acute cholecystitis. Advise correlation with ultrasound of the gallbladder to further evaluate. Slight intrahepatic biliary duct dilatation may be a consequence of the changes is in the gallbladder. 2. Colitis versus early diverticulitis in the mid sigmoid colon. No bowel inflammation elsewhere noted. No bowel obstruction. No abscess in the abdomen or pelvis. Appendix appears normal. 3. Extensive periuterine vascular calcification. Question pelvic congestion syndrome. 4.  Hepatic steatosis. 5.  Aortic Atherosclerosis (ICD10-I70.0).   ASSESSMENT  Cynthia Young is a 86 y.o. female with a pmh significant for hypothyroidism, status post cholecystectomy (status post ERCP for choledocholithiasis), peptic ulcer disease manifested as gastric ulcer, GERD.  The patient is seen today for evaluation and management of:  1. Pyrosis   2. Gastric ulcer without hemorrhage or perforation, unspecified chronicity   3. History of choledocholithiasis   4. History of ERCP   5. Abnormal CT scan, sigmoid colon    The patient is clinically and hemodynamically stable.  She has done well, although it took quite a bit of time, to improve from this brings significant gallbladder and bile duct issues.  Her liver tests have normalized.  Hopefully she will never have another bout of issues with her biliary tree.  She has a history of gastric ulcer noted at the time of her ERCP.  Although it was not malignant appearing we have not performed surveillance of this to ensure healing.  Patient is doing relatively well overall so I suspect the ulcer has healed but we cannot know  definitively without reevaluation via EGD.  I went into great detail today about an EGD.  The risks and benefits of endoscopic evaluation were discussed with the patient; these include but are not limited to the risk of perforation, infection, bleeding, missed lesions, lack of diagnosis, severe illness requiring hospitalization, as well as anesthesia and sedation related illnesses.  The patient and daughter seem agreeable to consider this but needs a little bit more time.  Based on the patient's pyrosis symptoms we will need to restart her PPI and see how she does.  If the patient decides not to have an upper endoscopy then I would recommend we consider  a barium esophagram/upper GI series.  The previously noted possible colitis that was noted on her CT scan in May had resolved by July.  Likely this was artifactual or self-limited.  We discussed the possibility of a colonoscopy/sigmoidoscopy and the patient is not willing to have this at this time.  She does have a family history of colon cancer as documented but she defers on this for now for sure unless there are significant changes in her bowel habits.  We will see with the patient and daughter think about the upper endoscopy versus upper GI series.  They will call and let us know.  All patient questions were answered to the best of my ability, and the patient agrees to the aforementioned plan of action with follow-up as indicated.   PLAN  Recommend restarting omeprazole at 20 mg once daily -If you do not want to take omeprazole daily then recommend Pepcid 20 mg as needed issue of GERD at minimum Recommend upper endoscopy to ensure healing of prior gastric ulcer though if patient defers they understand the risks of potentially missing a malignant ulcer If no endoscopy is considered then for further evaluation of some of her symptoms would consider a barium esophagram/upper GI series Consideration of colonoscopy/sigmoidoscopy was discussed but deferred by  patient and family   No orders of the defined types were placed in this encounter.   New Prescriptions   No medications on file   Modified Medications   No medications on file    Planned Follow Up No follow-ups on file.   Total Time in Face-to-Face and in Coordination of Care for patient including independent/personal interpretation/review of prior testing, medical history, examination, medication adjustment, communicating results with the patient directly, and documentation with the EHR is 30 minutes.   Justice Britain, MD [facility] Office # [phone]

## 2020-09-23 ENCOUNTER — Telehealth: Payer: Self-pay | Admitting: Pharmacist

## 2020-09-23 NOTE — Progress Notes (Addendum)
Chronic Care Management Pharmacy Assistant   Name: Cynthia Young  MRN: [id number] DOB: Dec 02, 1929  Reason for Encounter: Disease State  Patient Questions:  1.  Have you seen any other providers since your last visit? No  2.  Any changes in your medicines or health? No   PCP : Mosie Lukes, MD   Their chronic conditions include: Hypertension, Hyperlipidemia, Pre-Diabetes, Hypothyroidism, GERD  Allergies:  No Known Allergies  Medications: Outpatient Encounter Medications as of 09/23/2020  Medication Sig   levothyroxine (SYNTHROID) 50 MCG tablet TAKE ONE TABLET BY MOUTH ONE TIME DAILY   omeprazole (PRILOSEC) 40 MG capsule Take 1 capsule (40 mg total) by mouth daily.   Vitamin D, Ergocalciferol, (DRISDOL) 1.25 MG (50000 UNIT) CAPS capsule Take 1 capsule (50,000 Units total) by mouth every 7 (seven) days.   No facility-administered encounter medications on file as of 09/23/2020.    Current Diagnosis: Patient Active Problem List   Diagnosis Date Noted   Pyrosis 09/20/2020   Gastric ulcer without hemorrhage or perforation 09/20/2020   History of choledocholithiasis 09/20/2020   Abnormal CT scan, sigmoid colon 09/20/2020   History of ERCP 09/20/2020   Hyperkalemia 09/07/2020   Hyperglycemia 09/04/2020   Hypocalcemia 09/04/2020   Acute cholecystitis 04/09/2020   Varicose veins of both lower extremities 08/03/2019   Hiatal hernia with GERD 03/16/2019   Myalgia 09/25/2018   Preventative health care 03/30/2018   Hypothyroid    Hyperlipidemia     Goals Addressed   None    Reviewed chart prior to disease state call. Spoke with patient regarding BP  Recent Office Vitals: BP Readings from Last 3 Encounters:  09/19/20 110/80  09/04/20 102/64  07/18/20 (!) 174/90   Pulse Readings from Last 3 Encounters:  09/19/20 65  09/04/20 (!) 41  07/18/20 82    Wt Readings from Last 3 Encounters:  09/19/20 111 lb (50.3 kg)  09/04/20 109 lb 3.2 oz (49.5 kg)  07/18/20 104 lb 8  oz (47.4 kg)     Kidney Function Lab Results  Component Value Date/Time   CREATININE 0.79 09/04/2020 11:25 AM   CREATININE 0.81 06/24/2020 04:20 PM   CREATININE 0.89 06/09/2020 09:09 AM   GFR 66.66 06/24/2020 04:20 PM   GFRNONAA 58 (L) 06/09/2020 09:09 AM   GFRAA >60 06/09/2020 09:09 AM    BMP Latest Ref Rng & Units 09/04/2020 06/24/2020 06/09/2020  Glucose 65 - 99 mg/dL 96 133(H) 120(H)  BUN 7 - 25 mg/dL 15 20 17   Creatinine 0.60 - 0.88 mg/dL 0.79 0.81 0.89  BUN/Creat Ratio 6 - 22 (calc) NOT APPLICABLE - -  Sodium 950 - 146 mmol/L 143 140 141  Potassium 3.5 - 5.3 mmol/L 5.4(H) 3.9 4.3  Chloride 98 - 110 mmol/L 106 107 104  CO2 20 - 32 mmol/L 28 25 26   Calcium 8.6 - 10.4 mg/dL 10.1 9.3 9.3    Current antihypertensive regimen:  Diet and exercise management   How often are you checking your Blood Pressure? infrequently when ever she does go to a doctors appointment Current home BP readings: None What recent interventions/DTPs have been made by any provider to improve Blood Pressure control since last CPP Visit: None Any recent hospitalizations or ED visits since last visit with CPP? No What diet changes have been made to improve Blood Pressure Control?  Reports she is trying to eat what is good for her. What exercise is being done to improve your Blood Pressure Control?  Reports when she goes  shopping with her daughters once a week, does garden work and house work  Adherence Review: Patient states she is not getting a cuff and her blood pressure is fine Is the patient currently on ACE/ARB medication? No Does the patient have >5 day gap between last estimated fill dates? No    Follow-Up:  Pharmacist Review   [place] Shannon, [place] [facility] [place] Pharmacist Assistant [phone]  Reviewed by: De Blanch, PharmD Clinical Pharmacist [facility] [phone]

## 2020-10-21 ENCOUNTER — Ambulatory Visit: Payer: Medicare Other | Admitting: Gastroenterology

## 2020-10-30 ENCOUNTER — Ambulatory Visit: Payer: Medicare Other | Admitting: Pharmacist

## 2020-10-30 DIAGNOSIS — E785 Hyperlipidemia, unspecified: Secondary | ICD-10-CM

## 2020-10-30 DIAGNOSIS — R739 Hyperglycemia, unspecified: Secondary | ICD-10-CM

## 2020-10-30 NOTE — Chronic Care Management (AMB) (Signed)
Chronic Care Management Pharmacy  Name: Cynthia Young  MRN: [id number] DOB: [birth year]  Chief Complaint/ HPI  Cynthia Young,  86 y.o. , female presents for their Follow-Up CCM visit with the clinical pharmacist via telephone due to COVID-19 Pandemic.  PCP : Cynthia Lukes, MD  Their chronic conditions include: Hypertension, Hyperlipidemia, Pre-Diabetes, Hypothyroidism, GERD  Office Visits: 09/04/20: Visit w/ Dr. Charlett Young - Start on Vitamin D 50000 IU caps, 1 cap po weekly x 12 weeks. Disp #4 with 4 rf. Also take daily Vitamin D over the counter. If already taking a daily supplement increase by 1000 IU daily and if not start Vitamin D 2000 IU daily. Also potassium is up some so minimize intake again and up water intake by 1 glass a Cynthia Young. No bananas, cantalope, orange juice, multivitamins, potatoes, watermelon. Then recheck cmp next week. If potassium is still high we will add a medication to bring down the potassium levels.   Consult Visit: 09/29/20: Cynthia Young visit w/ Dr. Rush Young - Pyrosis: restart PPI and see how patient does. If patient decides not to have upper endoscopy, consider a barium esophagram/upper GI series. Omeprazole 50m daily or prn    Medications: Outpatient Encounter Medications as of 10/30/2020  Medication Sig Note  . levothyroxine (SYNTHROID) 50 MCG tablet TAKE ONE TABLET BY MOUTH ONE TIME DAILY   . omeprazole (PRILOSEC) 40 MG capsule Take 1 capsule (40 mg total) by mouth daily. 10/30/2020: Dose reduced to 270mper gastro  . Vitamin D, Ergocalciferol, (DRISDOL) 1.25 MG (50000 UNIT) CAPS capsule Take 1 capsule (50,000 Units total) by mouth every 7 (seven) days.    No facility-administered encounter medications on file as of 10/30/2020.   SDOH Screenings   Alcohol Screen: Not on file  Depression (PHQ2-9): Low Risk   . PHQ-2 Score: 3  Financial Resource Strain: Low Risk   . Difficulty of Paying Living Expenses: Not very hard  Food Insecurity: Not on file  Housing:  Not on file  Physical Activity: Not on file  Social Connections: Not on file  Stress: Not on file  Tobacco Use: Low Risk   . Smoking Tobacco Use: Never Smoker  . Smokeless Tobacco Use: Never Used  Transportation Needs: Not on file     Current Diagnosis/Assessment:  Goals Addressed            This Visit's Progress   . Chronic Care Management Pharmacy Care Plan       CARE PLAN ENTRY (see longitudinal plan of care for additional care plan information)  Current Barriers:  . Chronic Disease Management support, education, and care coordination needs related to Hypertension, Hyperlipidemia, Pre-Diabetes, Hypothyroidism, GERD   Hypertension Screening BP Readings from Last 3 Encounters:  09/19/20 110/80  09/04/20 102/64  07/18/20 (!) 174/90   . Pharmacist Clinical Goal(s): o Over the next 180 days, patient will work with PharmD and providers to maintain BP goal <140/90 . Current regimen:  o Diet and exercise management   . Interventions: o Consider purchasing blood pressure cuff o Check blood pressure 2-3 times per week and record o Discussed BP goal . Patient self care activities - Over the next 180 days, patient will: o Maintain blood pressure less than 140/90  Hyperlipidemia Lab Results  Component Value Date/Time   LDLCALC 128 (H) 09/04/2020 11:25 AM   . Pharmacist Clinical Goal(s): o Over the next 180 days, patient will work with PharmD and providers to achieve LDL goal < 100 or continue LDL trend downward . Current  regimen:  o Diet and exercise management   . Interventions: o Discussed diet and exercise . Patient self care activities - Over the next 180 days, patient will: o Maintain a balanced diet  Pre-Diabetes Lab Results  Component Value Date/Time   HGBA1C 5.9 (H) 09/04/2020 11:25 AM   HGBA1C 6.1 02/28/2020 11:38 AM   . Pharmacist Clinical Goal(s): o Over the next 180 days, patient will work with PharmD and providers to maintain A1c goal <6.5% . Current  regimen:  o Diet and exercise management   . Interventions: o Discussed diet and exercise o Discussed A1c goals . Patient self care activities - Over the next 180 days, patient will: o Maintain a1c <6.5%  Vitamin D Deficiency  Vit D, 25-Hydroxy  Date Value Ref Range Status  09/04/2020 21 (L) 30 - 100 ng/mL Final    Comment:    Vitamin D Status         25-OH Vitamin D: . Deficiency:                    <20 ng/mL Insufficiency:             20 - 29 ng/mL Optimal:                 > or = 30 ng/mL . For 25-OH Vitamin D testing on patients on  D2-supplementation and patients for whom quantitation  of D2 and D3 fractions is required, the QuestAssureD(TM) 25-OH VIT D, (D2,D3), LC/MS/MS is recommended: order  code [id number] (patients >65yr). See Note 1 . Note 1 . For additional information, please refer to  http://education.Quest[facility].com/faq/FAQ199  (This link is being provided for informational/ educational purposes only.)     . Pharmacist Clinical Goal(s) o Over the next 90 days, patient will work with PharmD and providers to achieve vitamin D level within normal limits . Current regimen:  o Vitamin D 50,000 units weekly . Interventions: o Discussed Dr. BCharlett Blakerecommendation of an additional 2000 units of vitamin D3 over the counter to supplement her prescription vitamin D . Patient self care activities - Over the next 90 days, patient will: o Maintain vitamin D deficiency medication regimen o Consider increasing over the counter vitamin D to 2000 units daily  Health Maintenance  . Pharmacist Clinical Goal(s) o Over the next 180 days, patient will work with PharmD and providers to complete health maintenance screenings/vaccinations . Interventions: o Discussed Flu vaccine o Discussed Pneumonia vaccine o Discussed Shingrix vaccine o Increase water intake to 3 bottles of water a Cynthia Young at least o Increase protein intake . Patient self care activities - Over the next 180  days, patient will: o Increase water intake to 3 bottles of water per Cynthia Young at least o Increase protein intake o Consider completing the following vaccines: . Pneumonia . Shingrix  Medication management . Pharmacist Clinical Goal(s): o Over the next 180 days, patient will work with PharmD and providers to maintain optimal medication adherence . Current pharmacy: [facility] . Interventions o Comprehensive medication review performed. o Continue current medication management strategy . Patient self care activities - Over the next 180 days, patient will: o Focus on medication adherence by filling and taking medications appropriately  o Take medications as prescribed o Report any questions or concerns to PharmD and/or provider(s)  Please see past updates related to this goal by clicking on the "Past Updates" button in the selected goal        Social Hx:  Lives alone.  Her 73 year old grandson was originally living with her while he was looking for a job. He served in [employer], television/film set. Lost grandson on Sept 6. He was hit by an oncoming car once his car crashed. Wakes up at 6:30am  Hypertension Screening   BP goal is:  <140/90  Office blood pressures are  BP Readings from Last 3 Encounters:  09/19/20 110/80  09/04/20 102/64  07/18/20 (!) 174/90   Patient checks BP at home never (does not have a cuff) Patient home BP readings are ranging: Unable to assess  Patient has failed these meds in the past: None noted  Patient is currently uncontrolled on the following medications:  . None  She admits she gets very tearful when thinking about her grandson.  She becomes tearful while on the phone.  We discussed BP goals   Update 10/30/20 Purchase BP cuff? No Didn't feel she needed it  Plan -Continue management with diet and exercise  Hyperlipidemia   LDL goal < 100  Lipid Panel     Component Value Date/Time   CHOL 217 (H) 09/04/2020 1125   TRIG 106 09/04/2020 1125   HDL 68  09/04/2020 1125   LDLCALC 128 (H) 09/04/2020 1125    Hepatic Function Latest Ref Rng & Units 09/04/2020 06/24/2020 06/09/2020  Total Protein 6.1 - 8.1 g/dL 7.1 7.0 7.0  Albumin 3.5 - 5.2 g/dL - 3.9 3.8  AST 10 - 35 U/L _0 ALT 6 - 29 U/L _1 Alk Phosphatase 39 - 117 U/L - 100 74  Total Bilirubin 0.2 - 1.2 mg/dL 0.4 0.2 0.6     The ASCVD Risk score Mikey Bussing DC Jr., et al., 2013) failed to calculate for the following reasons:   The 2013 ASCVD risk score is only valid for ages 65 to 5   Patient has failed these meds in past: None noted  Patient is currently uncontrolled, but trending down on the following medications:  . None  Noting patient's age, would not recommend initiation of statin for primary prevention.  Diet Admits she doesn't drink a lot of water. #2 8oz bottles Wonders if she can drink ensure with her meals. States her son was concerned about her weight loss last time he visited her.  Admits that she has been eating a lot of bread lately. Cynthia -  1-2 eggs about 5 days a week; coffee and toast L - Tuna salad; Grilled cheese; Ham and cheese "I love cheese" D  - Pasta with vegetables Snacks - Nuts, ice cream "I'm trying to get away from the ice cream", crackers and cheese Drinks - Coffee, decaf tea, minimal water  Exercise  Walks around the house. Pulls weeds.  Varicose veins limit her movement.  Update 10/30/20 LDL above goal, but trending down States her diet has been off with the holidays   Plan -Continue control with diet and exercise  Pre-Diabetes   A1c goal <6.5%  Recent Relevant Labs: Lab Results  Component Value Date/Time   HGBA1C 5.9 (H) 09/04/2020 11:25 AM   HGBA1C 6.1 02/28/2020 11:38 AM   GFR 66.66 06/24/2020 04:20 PM   GFR 62.22 05/22/2020 01:55 PM    Patient has failed these meds in past: None noted  Patient is currently controlled on the following medications: . None  We discussed the importance of protein and vegetable intake.   Noting patient's age we discussed that she doesn't have to make major changes.  We discussed: diet and exercise extensively  Update 10/30/20 A1c trending down  Plan -Continue control with diet and exercise   Hypothyroidism   Lab Results  Component Value Date/Time   TSH 2.53 09/04/2020 11:25 AM   TSH 1.48 02/28/2020 11:38 AM    Patient has failed these meds in past: None noted  Patient is currently controlled on the following medications:  . Levothyroxine 58mg daily  She asks about her dose of medication.  She feels her skin is very dry. She wonders what can be done about this. She is also concerned about hair loss. She associates this with anesthesia. Admits she doesn't drink a lot of water. "I drink water like it's poison". Reports she drinks about #2 8oz of water per Brycelynn Stampley  We discussed:  Importance of water and protein intake   Update 10/30/20 TSH WNL  Plan -Continue current medications  -Increase water intake to at least #3 bottles of water per Chanler Schreiter -Increase protein intake. Consider purchasing ensure with high protein content.  GERD   Patient has failed these meds in past: None noted  Patient is currently controlled on the following medications:  . Omeprazole 240mdaily (gets OTC)  Breakthrough Sx: Minimal Breakthrough Tx: N/A Triggers: Eating too much, acidic drinks (orange juice, coffee)  Update 10/30/20 What dose taking? 2040maily  Plan -Continue current medications   Vitamin D Deficiency    Vit D, 25-Hydroxy  Date Value Ref Range Status  09/04/2020 21 (L) 30 - 100 ng/mL Final    Comment:    Vitamin D Status         25-OH Vitamin D: . Deficiency:                    <20 ng/mL Insufficiency:             20 - 29 ng/mL Optimal:                 > or = 30 ng/mL . For 25-OH Vitamin D testing on patients on  D2-supplementation and patients for whom quantitation  of D2 and D3 fractions is required, the QuestAssureD(TM) 25-OH VIT D, (D2,D3),  LC/MS/MS is recommended: order  code [id number] >34yr35yrSee Note 1 . Note 1 . For additional information, please refer to  http://education.[facility].com/faq/FAQ199  (This link is being provided for informational/ educational purposes only.)      Patient has failed these meds in past: None noted  Patient is currently uncontrolled, but being treated on the following medications: . ViMarland Kitchenamin D 50,000 units weekly (Saturday)  Did not start OTC vitamin D Asks if Centrum Silver is sufficient - Vitamin D 1000 units Discussed that Dr. BlytCharlett Blakeommended 2000 units of vitamin D OTC  Plan -Consider starting vitamin D 2000 units (an additional 1000 units outside of prescription vitamin D and multivitamin)   Vaccines   Reviewed and discussed patient's vaccination history.    Immunization History  Administered Date(s) Administered  . Fluad Quad(high Dose 65+) 09/04/2020  . Moderna Sars-Covid-2 Vaccination 01/14/2020, 02/22/2020   Update 10/30/20 Receive any vaccines? Yes to flu, no to pneumonia, no to shingles  Plan -Recommended patient receive the following vaccines:  Pneumovax 23 in pharmacy or office  Shingrix in pharmacy  Medication Management   Pt uses [facility] pharmacy for all medications Uses pill box? Yes Pt endorses 90% compliance  We discussed: Current pharmacy is preferred with insurance plan and patient is satisfied with pharmacy services  Plan -Continue current medication management strategy   Follow up: 6  month phone visit Needs visit with Dr. Charlett Young around 12/05/20  De Blanch, PharmD, BCACP Clinical Pharmacist [facility] at [facility] [phone]

## 2020-10-30 NOTE — Patient Instructions (Addendum)
Visit Information  Goals Addressed            This Visit's Progress   . Chronic Care Management Pharmacy Care Plan       CARE PLAN ENTRY (see longitudinal plan of care for additional care plan information)  Current Barriers:  . Chronic Disease Management support, education, and care coordination needs related to Hypertension, Hyperlipidemia, Pre-Diabetes, Hypothyroidism, GERD   Hypertension Screening BP Readings from Last 3 Encounters:  09/19/20 110/80  09/04/20 102/64  07/18/20 (!) 174/90   . Pharmacist Clinical Goal(s): o Over the next 180 days, patient will work with PharmD and providers to maintain BP goal <140/90 . Current regimen:  o Diet and exercise management   . Interventions: o Consider purchasing blood pressure cuff o Check blood pressure 2-3 times per week and record o Discussed BP goal . Patient self care activities - Over the next 180 days, patient will: o Maintain blood pressure less than 140/90  Hyperlipidemia Lab Results  Component Value Date/Time   LDLCALC 128 (H) 09/04/2020 11:25 AM   . Pharmacist Clinical Goal(s): o Over the next 180 days, patient will work with PharmD and providers to achieve LDL goal < 100 or continue LDL trend downward . Current regimen:  o Diet and exercise management   . Interventions: o Discussed diet and exercise . Patient self care activities - Over the next 180 days, patient will: o Maintain a balanced diet  Pre-Diabetes Lab Results  Component Value Date/Time   HGBA1C 5.9 (H) 09/04/2020 11:25 AM   HGBA1C 6.1 02/28/2020 11:38 AM   . Pharmacist Clinical Goal(s): o Over the next 180 days, patient will work with PharmD and providers to maintain A1c goal <6.5% . Current regimen:  o Diet and exercise management   . Interventions: o Discussed diet and exercise o Discussed A1c goals . Patient self care activities - Over the next 180 days, patient will: o Maintain a1c <6.5%  Health Maintenance  . Pharmacist Clinical  Goal(s) o Over the next 1800 days, patient will work with PharmD and providers to complete health maintenance screenings/vaccinations . Interventions: o Discussed Flu vaccine o Discussed Pneumonia vaccine o Discussed Shingrix vaccine o Increase water intake to 3 bottles of water a Marianny Goris at least o Increase protein intake . Patient self care activities - Over the next 180 days, patient will: o Increase water intake to 3 bottles of water per Shubh Chiara at least o Increase protein intake o Consider completing the following vaccines: . Pneumonia . Shingrix  Medication management . Pharmacist Clinical Goal(s): o Over the next 180 days, patient will work with PharmD and providers to maintain optimal medication adherence . Current pharmacy: [facility] . Interventions o Comprehensive medication review performed. o Continue current medication management strategy . Patient self care activities - Over the next 180 days, patient will: o Focus on medication adherence by filling and taking medications appropriately  o Take medications as prescribed o Report any questions or concerns to PharmD and/or provider(s)  Please see past updates related to this goal by clicking on the "Past Updates" button in the selected goal         The patient verbalized understanding of instructions, educational materials, and care plan provided today and agreed to receive a mailed copy of patient instructions, educational materials, and care plan.   Telephone follow up appointment with pharmacy team member scheduled for:  04/30/2021  Melvenia Beam Armel Rabbani, [facility]

## 2020-10-31 ENCOUNTER — Telehealth: Payer: Self-pay | Admitting: Family Medicine

## 2020-10-31 NOTE — Telephone Encounter (Signed)
Called patient to schedule a 3 month follow up appointment after visit with pharmacist and patient declined visit b/c of provider availability in january.

## 2020-11-04 ENCOUNTER — Telehealth: Payer: Self-pay | Admitting: Family Medicine

## 2020-11-04 NOTE — Telephone Encounter (Signed)
Patient states she got bunch of information in the mail. She states she is getting confuse with all the info. She would also  Like to know who is paying for all the information she is getting in the mail.

## 2020-11-17 NOTE — Telephone Encounter (Signed)
I was finally able to reach patient and answered her questions.  She also inquired about receiving the covid vaccine booster.  Got patient scheduled with the pharmacy downstairs to receive her booster on 11/28/20 at 1pm.  Patient is grateful for the coordination.  Encouraged her to let us know if we can assist with anything further.   13 minutes spent in review, call, coordination, and documentation.   Katrinka Blazing, PharmD, BCACP Clinical Pharmacist [facility] at [facility] [phone]

## 2020-11-28 ENCOUNTER — Ambulatory Visit: Payer: Medicare Other

## 2021-01-05 ENCOUNTER — Telehealth: Payer: Self-pay | Admitting: Family Medicine

## 2021-01-05 NOTE — Telephone Encounter (Signed)
Patient called she would like  lab order and she also no longer want to be on the care plan she would like for the nurse to reach out to her

## 2021-01-05 NOTE — Telephone Encounter (Signed)
Pt is scheduled for 01/06/21

## 2021-01-06 ENCOUNTER — Telehealth: Payer: Medicare Other | Admitting: Family Medicine

## 2021-01-06 ENCOUNTER — Ambulatory Visit: Payer: Medicare Other | Admitting: Family Medicine

## 2021-01-09 NOTE — Progress Notes (Deleted)
[place] at [facility] [address], [place], [place] [address] [phone]  Date:  01/12/2021   Name:  Cynthia Young   DOB:  January 04, 1930   MRN:  [id number]  PCP:  Mosie Lukes, MD    Chief Complaint: No chief complaint on file.   History of Present Illness:  Cynthia Young is a 87 y.o. very pleasant female patient who presents with the following:  Here today for follow-up and labs - hypothyroidism, hyperlipidemia, elevated glucose, hypocalcemia  I did see her in the past, June 2021 Last visit with PCP Dr Charlett Blake in October 2021  She would like to have some labs drawn  Patient Active Problem List   Diagnosis Date Noted  . Pyrosis 09/20/2020  . Gastric ulcer without hemorrhage or perforation 09/20/2020  . History of choledocholithiasis 09/20/2020  . Abnormal CT scan, sigmoid colon 09/20/2020  . History of ERCP 09/20/2020  . Hyperkalemia 09/07/2020  . Hyperglycemia 09/04/2020  . Hypocalcemia 09/04/2020  . Acute cholecystitis 04/09/2020  . Varicose veins of both lower extremities 08/03/2019  . Hiatal hernia with GERD 03/16/2019  . Myalgia 09/25/2018  . Preventative health care 03/30/2018  . Hypothyroid   . Hyperlipidemia     Past Medical History:  Diagnosis Date  . Arthritis   . Hyperglycemia 09/04/2020  . Hyperlipidemia   . Hypothyroid     Past Surgical History:  Procedure Laterality Date  . BACK SURGERY    . BIOPSY  04/12/2020   Procedure: BIOPSY;  Surgeon: Rush Landmark Telford Nab., MD;  Location: [facility];  Service: Gastroenterology;;  . Lillard Anes Left   . CHOLECYSTECTOMY N/A 04/11/2020   Procedure: LAPAROSCOPIC CHOLECYSTECTOMY WITH INTRAOPERATIVE CHOLANGIOGRAM;  Surgeon: Alphonsa Overall, MD;  Location: [facility];  Service: General;  Laterality: N/A;  . ERCP N/A 04/12/2020   Procedure: ENDOSCOPIC RETROGRADE CHOLANGIOPANCREATOGRAPHY (ERCP);  Surgeon: Irving Copas., MD;  Location: [facility];   Service: Gastroenterology;  Laterality: N/A;  . FOOT SURGERY Right    bunion  . REMOVAL OF STONES  04/12/2020   Procedure: REMOVAL OF STONES;  Surgeon: Rush Landmark Telford Nab., MD;  Location: [facility];  Service: Gastroenterology;;  . [name]  04/12/2020   Procedure: [name];  Surgeon: Rush Landmark Telford Nab., MD;  Location: [facility];  Service: Gastroenterology;;    Social History   Tobacco Use  . Smoking status: Never Smoker  . Smokeless tobacco: Never Used  Vaping Use  . Vaping Use: Never used  Substance Use Topics  . Alcohol use: Yes    Comment: socially  . Drug use: Never    Family History  Problem Relation Age of Onset  . Cancer Mother   . Heart disease Father   . Arthritis Father        rheumatoid  . Cancer Brother        colon  . Colon cancer Brother   . Esophageal cancer Neg Hx   . Inflammatory bowel disease Neg Hx   . Liver disease Neg Hx   . Pancreatic cancer Neg Hx   . Stomach cancer Neg Hx     No Known Allergies  Medication list has been reviewed and updated.  Current Outpatient Medications on File Prior to Visit  Medication Sig Dispense Refill  . levothyroxine (SYNTHROID) 50 MCG tablet TAKE ONE TABLET BY MOUTH ONE TIME DAILY 90 tablet 1  . omeprazole (PRILOSEC) 40 MG capsule Take 1 capsule (40 mg total) by mouth daily. 60 capsule 1  .  Vitamin D, Ergocalciferol, (DRISDOL) 1.25 MG (50000 UNIT) CAPS capsule Take 1 capsule (50,000 Units total) by mouth every 7 (seven) days. 12 capsule 0   No current facility-administered medications on file prior to visit.    Review of Systems:  As per HPI- otherwise negative.   Physical Examination: There were no vitals filed for this visit. There were no vitals filed for this visit. There is no height or weight on file to calculate BMI. Ideal Body Weight:    GEN: no acute distress. HEENT: Atraumatic, Normocephalic.  Ears and Nose: No external deformity. CV: RRR, No M/G/R. No JVD. No  thrill. No extra heart sounds. PULM: CTA B, no wheezes, crackles, rhonchi. No retractions. No resp. distress. No accessory muscle use. ABD: S, NT, ND, +BS. No rebound. No HSM. EXTR: No c/c/e PSYCH: Normally interactive. Conversant.    Assessment and Plan: *** This visit occurred during the SARS-CoV-2 public health emergency.  Safety protocols were in place, including screening questions prior to the visit, additional usage of staff PPE, and extensive cleaning of exam room while observing appropriate contact time as indicated for disinfecting solutions.    Signed Lamar Blinks, MD

## 2021-01-10 NOTE — Patient Instructions (Incomplete)
Good to see you today- I will be in touch with your labs asap  You can by imaging on the way out and schedule your mammogram and coronary calcium  Take care!  Congrats on your marathon!!   

## 2021-01-12 ENCOUNTER — Other Ambulatory Visit: Payer: Self-pay

## 2021-01-12 ENCOUNTER — Other Ambulatory Visit (INDEPENDENT_AMBULATORY_CARE_PROVIDER_SITE_OTHER): Payer: Medicare Other

## 2021-01-12 ENCOUNTER — Ambulatory Visit: Payer: Medicare Other | Admitting: Family Medicine

## 2021-01-12 ENCOUNTER — Other Ambulatory Visit: Payer: Self-pay | Admitting: Family Medicine

## 2021-01-12 DIAGNOSIS — E039 Hypothyroidism, unspecified: Secondary | ICD-10-CM

## 2021-01-12 DIAGNOSIS — R739 Hyperglycemia, unspecified: Secondary | ICD-10-CM | POA: Diagnosis not present

## 2021-01-12 DIAGNOSIS — E785 Hyperlipidemia, unspecified: Secondary | ICD-10-CM | POA: Diagnosis not present

## 2021-01-12 DIAGNOSIS — R109 Unspecified abdominal pain: Secondary | ICD-10-CM

## 2021-01-12 LAB — COMPREHENSIVE METABOLIC PANEL
ALT: 9 U/L (ref 0–35)
AST: 14 U/L (ref 0–37)
Albumin: 4.2 g/dL (ref 3.5–5.2)
Alkaline Phosphatase: 66 U/L (ref 39–117)
BUN: 20 mg/dL (ref 6–23)
CO2: 30 mEq/L (ref 19–32)
Calcium: 10.2 mg/dL (ref 8.4–10.5)
Chloride: 104 mEq/L (ref 96–112)
Creatinine, Ser: 0.96 mg/dL (ref 0.40–1.20)
GFR: 52.65 mL/min — ABNORMAL LOW (ref >60.00)
Glucose, Bld: 93 mg/dL (ref 70–99)
Potassium: 5.8 mEq/L — ABNORMAL HIGH (ref 3.5–5.1)
Sodium: 140 mEq/L (ref 135–145)
Total Bilirubin: 0.5 mg/dL (ref 0.2–1.2)
Total Protein: 6.8 g/dL (ref 6.0–8.3)

## 2021-01-12 LAB — CBC
HCT: 43.3 % (ref 36.0–46.0)
Hemoglobin: 14.3 g/dL (ref 12.0–15.0)
MCHC: 33 g/dL (ref 30.0–36.0)
MCV: 92.3 fl (ref 78.0–100.0)
Platelets: 251 10*3/uL (ref 150.0–400.0)
RBC: 4.69 Mil/uL (ref 3.87–5.11)
RDW: 13.4 % (ref 11.5–15.5)
WBC: 7.5 10*3/uL (ref 4.0–10.5)

## 2021-01-12 LAB — LIPID PANEL
Cholesterol: 194 mg/dL (ref 0–200)
HDL: 59 mg/dL (ref >39.00)
LDL Cholesterol: 110 mg/dL — ABNORMAL HIGH (ref 0–99)
NonHDL: 134.5
Total CHOL/HDL Ratio: 3
Triglycerides: 121 mg/dL (ref 0.0–149.0)
VLDL: 24.2 mg/dL (ref 0.0–40.0)

## 2021-01-12 LAB — VITAMIN D 25 HYDROXY (VIT D DEFICIENCY, FRACTURES): VITD: 33.26 ng/mL (ref 30.00–100.00)

## 2021-01-12 LAB — HEMOGLOBIN A1C: Hgb A1c MFr Bld: 6.1 % (ref 4.6–6.5)

## 2021-01-12 LAB — TSH: TSH: 2.8 u[IU]/mL (ref 0.35–4.50)

## 2021-01-12 MED ORDER — SODIUM POLYSTYRENE SULFONATE 15 GM/60ML PO SUSP: 15.0000 g | Freq: Every day | ORAL | 1 refills | Status: DC | PRN

## 2021-01-13 ENCOUNTER — Other Ambulatory Visit: Payer: Self-pay | Admitting: *Deleted

## 2021-01-13 DIAGNOSIS — E875 Hyperkalemia: Secondary | ICD-10-CM

## 2021-01-14 ENCOUNTER — Encounter (HOSPITAL_BASED_OUTPATIENT_CLINIC_OR_DEPARTMENT_OTHER): Payer: Self-pay | Admitting: Emergency Medicine

## 2021-01-14 ENCOUNTER — Other Ambulatory Visit: Payer: Self-pay

## 2021-01-14 ENCOUNTER — Observation Stay (HOSPITAL_BASED_OUTPATIENT_CLINIC_OR_DEPARTMENT_OTHER)
Admission: EM | Admit: 2021-01-14 | Discharge: 2021-01-14 | Disposition: A | Discharge status: Home / Self Care | Payer: Medicare Other | Attending: Internal Medicine | Admitting: Internal Medicine

## 2021-01-14 DIAGNOSIS — K5900 Constipation, unspecified: Secondary | ICD-10-CM

## 2021-01-14 DIAGNOSIS — R109 Unspecified abdominal pain: Secondary | ICD-10-CM | POA: Insufficient documentation

## 2021-01-14 DIAGNOSIS — K259 Gastric ulcer, unspecified as acute or chronic, without hemorrhage or perforation: Secondary | ICD-10-CM

## 2021-01-14 DIAGNOSIS — E875 Hyperkalemia: Secondary | ICD-10-CM | POA: Diagnosis present

## 2021-01-14 DIAGNOSIS — R519 Headache, unspecified: Secondary | ICD-10-CM | POA: Diagnosis not present

## 2021-01-14 DIAGNOSIS — I441 Atrioventricular block, second degree: Secondary | ICD-10-CM | POA: Diagnosis not present

## 2021-01-14 DIAGNOSIS — T50995A Adverse effect of other drugs, medicaments and biological substances, initial encounter: Secondary | ICD-10-CM | POA: Diagnosis not present

## 2021-01-14 DIAGNOSIS — I440 Atrioventricular block, first degree: Secondary | ICD-10-CM

## 2021-01-14 DIAGNOSIS — Z20822 Contact with and (suspected) exposure to covid-19: Secondary | ICD-10-CM | POA: Diagnosis not present

## 2021-01-14 DIAGNOSIS — G3184 Mild cognitive impairment, so stated: Secondary | ICD-10-CM | POA: Diagnosis not present

## 2021-01-14 DIAGNOSIS — E039 Hypothyroidism, unspecified: Secondary | ICD-10-CM | POA: Diagnosis not present

## 2021-01-14 DIAGNOSIS — R001 Bradycardia, unspecified: Secondary | ICD-10-CM | POA: Diagnosis not present

## 2021-01-14 DIAGNOSIS — Z79899 Other long term (current) drug therapy: Secondary | ICD-10-CM | POA: Insufficient documentation

## 2021-01-14 DIAGNOSIS — R739 Hyperglycemia, unspecified: Secondary | ICD-10-CM | POA: Insufficient documentation

## 2021-01-14 DIAGNOSIS — R42 Dizziness and giddiness: Secondary | ICD-10-CM

## 2021-01-14 DIAGNOSIS — R1010 Upper abdominal pain, unspecified: Secondary | ICD-10-CM

## 2021-01-14 DIAGNOSIS — E785 Hyperlipidemia, unspecified: Secondary | ICD-10-CM | POA: Diagnosis present

## 2021-01-14 LAB — CBC WITH DIFFERENTIAL/PLATELET
Abs Immature Granulocytes: 0.02 10*3/uL (ref 0.00–0.07)
Basophils Absolute: 0.1 10*3/uL (ref 0.0–0.1)
Basophils Relative: 1 %
Eosinophils Absolute: 0.2 10*3/uL (ref 0.0–0.5)
Eosinophils Relative: 2 %
HCT: 43.8 % (ref 36.0–46.0)
Hemoglobin: 14.4 g/dL (ref 12.0–15.0)
Immature Granulocytes: 0 %
Lymphocytes Relative: 37 %
Lymphs Abs: 2.9 10*3/uL (ref 0.7–4.0)
MCH: 30.7 pg (ref 26.0–34.0)
MCHC: 32.9 g/dL (ref 30.0–36.0)
MCV: 93.4 fL (ref 80.0–100.0)
Monocytes Absolute: 0.8 10*3/uL (ref 0.1–1.0)
Monocytes Relative: 10 %
Neutro Abs: 3.9 10*3/uL (ref 1.7–7.7)
Neutrophils Relative %: 50 %
Platelets: 266 10*3/uL (ref 150–400)
RBC: 4.69 MIL/uL (ref 3.87–5.11)
RDW: 13.1 % (ref 11.5–15.5)
WBC: 7.9 10*3/uL (ref 4.0–10.5)
nRBC: 0 % (ref 0.0–0.2)

## 2021-01-14 LAB — BASIC METABOLIC PANEL
Anion gap: 10 (ref 5–15)
BUN: 20 mg/dL (ref 8–23)
CO2: 24 mmol/L (ref 22–32)
Calcium: 9.1 mg/dL (ref 8.9–10.3)
Chloride: 103 mmol/L (ref 98–111)
Creatinine, Ser: 0.82 mg/dL (ref 0.44–1.00)
GFR, Estimated: >60 mL/min (ref >60)
Glucose, Bld: 109 mg/dL — ABNORMAL HIGH (ref 70–99)
Potassium: 3.6 mmol/L (ref 3.5–5.1)
Sodium: 137 mmol/L (ref 135–145)

## 2021-01-14 LAB — RESP PANEL BY RT-PCR (FLU A&B, COVID) ARPGX2
Influenza A by PCR: NEGATIVE
Influenza B by PCR: NEGATIVE
SARS Coronavirus 2 by RT PCR: NEGATIVE

## 2021-01-14 LAB — MAGNESIUM: Magnesium: 2.1 mg/dL (ref 1.7–2.4)

## 2021-01-14 MED ORDER — MORPHINE SULFATE (PF) 2 MG/ML IV SOLN: 2.0000 mg | INTRAVENOUS | Status: DC | PRN

## 2021-01-14 MED ORDER — BISACODYL 5 MG PO TBEC: 5.0000 mg | DELAYED_RELEASE_TABLET | Freq: Every day | ORAL | Status: DC | PRN

## 2021-01-14 MED ORDER — LEVOTHYROXINE SODIUM 50 MCG PO TABS
50.0000 ug | ORAL_TABLET | Freq: Every day | ORAL | Status: DC
  Administered 2021-01-14: 50 ug via ORAL
  Filled 2021-01-14: qty 1

## 2021-01-14 MED ORDER — ONDANSETRON HCL 4 MG PO TABS: 4.0000 mg | ORAL_TABLET | Freq: Four times a day (QID) | ORAL | Status: DC | PRN

## 2021-01-14 MED ORDER — DOCUSATE SODIUM 100 MG PO CAPS
100.0000 mg | ORAL_CAPSULE | Freq: Two times a day (BID) | ORAL | Status: DC
  Filled 2021-01-14: qty 1

## 2021-01-14 MED ORDER — ACETAMINOPHEN 325 MG PO TABS: 650.0000 mg | ORAL_TABLET | Freq: Four times a day (QID) | ORAL | Status: DC | PRN

## 2021-01-14 MED ORDER — HYDROCODONE-ACETAMINOPHEN 5-325 MG PO TABS: 1.0000 | ORAL_TABLET | ORAL | Status: DC | PRN

## 2021-01-14 MED ORDER — HYDRALAZINE HCL 20 MG/ML IJ SOLN: 5.0000 mg | INTRAMUSCULAR | Status: DC | PRN

## 2021-01-14 MED ORDER — POLYETHYLENE GLYCOL 3350 17 G PO PACK: 17.0000 g | PACK | Freq: Every day | ORAL | Status: DC | PRN

## 2021-01-14 MED ORDER — PANTOPRAZOLE SODIUM 40 MG PO TBEC
40.0000 mg | DELAYED_RELEASE_TABLET | Freq: Every day | ORAL | Status: DC
  Administered 2021-01-14: 40 mg via ORAL
  Filled 2021-01-14: qty 1

## 2021-01-14 MED ORDER — SODIUM CHLORIDE 0.9% FLUSH
3.0000 mL | Freq: Two times a day (BID) | INTRAVENOUS | Status: DC
  Administered 2021-01-14: 3 mL via INTRAVENOUS

## 2021-01-14 MED ORDER — ONDANSETRON HCL 4 MG/2ML IJ SOLN: 4.0000 mg | Freq: Four times a day (QID) | INTRAMUSCULAR | Status: DC | PRN

## 2021-01-14 MED ORDER — ACETAMINOPHEN 650 MG RE SUPP: 650.0000 mg | Freq: Four times a day (QID) | RECTAL | Status: DC | PRN

## 2021-01-14 MED ORDER — LACTATED RINGERS IV SOLN: INTRAVENOUS | Status: DC

## 2021-01-14 NOTE — ED Triage Notes (Signed)
Pt states she took sodium polystyrene for hyperkalemia at 1900 for first time. Pt states afterwards she had abd pain, large amount of stool, and headache.

## 2021-01-14 NOTE — ED Notes (Signed)
Pt given a diet ginger ale 

## 2021-01-14 NOTE — H&P (Addendum)
History and Physical    Cynthia Young OYD:[id number] DOB: 1930/06/21 DOA: 01/14/2021  PCP: Mosie Lukes, MD Consultants:  Mansouraty - GI; Arnoldo Morale - neurosurgery Patient coming from:  Home; NOK: Daughter, Cynthia Young, [phone]  Chief Complaint: Dizziness  HPI: Cynthia Young is a 87 y.o. female with medical history significant of hypothyroidism; and HLD presenting with dizziness. The patient reports that she was in her usual state of health until she went to her PCP on for routine labs on 2/28 and was found to have a K+ of 5.8.  She was called in prn Kayexalate and within an hour of taking it started feeling dizzy/light headed.  She also had a headache and some mild abdominal pain that resolved after she had a BM.  She tried to wait it out and finally called her daughter due to the persistent HA and dizziness, and she took her to [facility].  In the ER, she was found found to have a persistent HR in hte 30s.  She has prior h/o sinus bradycardia in the 40-50s and has been told to avoid AV nodal blocking agents.    ED Course: [facility] to [facility] transfer, as per Dr. Tonie Griffith:  87 yo female presented to ER with feeling of dizziness, lightheadedness and not feeling well. Found to have bradycardia in 30's. Has hx of Mobitz type 1 and incomplete LBBB.  Was seen by PCP yesterday and had elevated potassium of 5.8 and was treated with one dose of Kayexalate at home. Does not appear that EKG was done at PCP yesterday.  ER physician discussed with cardiology who stated they will see in am for consult for EP and possible pacemaker. Pacing pads placed but not being paced currently.    Review of Systems: As per HPI; otherwise review of systems reviewed and negative.    COVID Vaccine Status:  Complete  Past Medical History:  Diagnosis Date  . Arthritis   . Hyperglycemia 09/04/2020  . Hyperlipidemia   . Hypothyroid     Past Surgical History:  Procedure Laterality Date  . BACK SURGERY    . BIOPSY   04/12/2020   Procedure: BIOPSY;  Surgeon: Rush Landmark Telford Nab., MD;  Location: [facility];  Service: Gastroenterology;;  . Lillard Anes Left   . CHOLECYSTECTOMY N/A 04/11/2020   Procedure: LAPAROSCOPIC CHOLECYSTECTOMY WITH INTRAOPERATIVE CHOLANGIOGRAM;  Surgeon: Alphonsa Overall, MD;  Location: [facility];  Service: General;  Laterality: N/A;  . ERCP N/A 04/12/2020   Procedure: ENDOSCOPIC RETROGRADE CHOLANGIOPANCREATOGRAPHY (ERCP);  Surgeon: Irving Copas., MD;  Location: [facility];  Service: Gastroenterology;  Laterality: N/A;  . FOOT SURGERY Right    bunion  . REMOVAL OF STONES  04/12/2020   Procedure: REMOVAL OF STONES;  Surgeon: Rush Landmark Telford Nab., MD;  Location: [facility];  Service: Gastroenterology;;  . [name]  04/12/2020   Procedure: [name];  Surgeon: Rush Landmark Telford Nab., MD;  Location: [facility];  Service: Gastroenterology;;    Social History   Socioeconomic History  . Marital status: Widowed    Spouse name: Not on file  . Number of children: Not on file  . Years of education: Not on file  . Highest education level: Not on file  Occupational History  . Not on file  Tobacco Use  . Smoking status: Never Smoker  . Smokeless tobacco: Never Used  Vaping Use  . Vaping Use: Never used  Substance and Sexual Activity  . Alcohol use: Yes    Comment: socially  . Drug use: Never  . Sexual  activity: Not Currently  Other Topics Concern  . Not on file  Social History Narrative   worked in [employer], short time. No cigarettes or drug use. Live by self no dietary restictions and walks daily   Social Determinants of Health   Financial Resource Strain: Low Risk   . Difficulty of Paying Living Expenses: Not very hard  Food Insecurity: Not on file  Transportation Needs: Not on file  Physical Activity: Not on file  Stress: Not on file  Social Connections: Not on file  Intimate Partner Violence: Not on file    No Known Allergies  Family  History  Problem Relation Age of Onset  . Cancer Mother   . Heart disease Father   . Arthritis Father        rheumatoid  . Cancer Brother        colon  . Colon cancer Brother   . Esophageal cancer Neg Hx   . Inflammatory bowel disease Neg Hx   . Liver disease Neg Hx   . Pancreatic cancer Neg Hx   . Stomach cancer Neg Hx     Prior to Admission medications   Medication Sig Start Date End Date Taking? Authorizing Provider  levothyroxine (SYNTHROID) 50 MCG tablet TAKE ONE TABLET BY MOUTH ONE TIME DAILY 09/08/20   Mosie Lukes, MD  omeprazole (PRILOSEC) 40 MG capsule Take 1 capsule (40 mg total) by mouth daily. 06/24/20   Noralyn Pick, NP  sodium polystyrene (KAYEXALATE) 15 GM/60ML suspension Take 60 mLs (15 g total) by mouth daily as needed (hyperkalemia). 01/12/21   Mosie Lukes, MD  Vitamin D, Ergocalciferol, (DRISDOL) 1.25 MG (50000 UNIT) CAPS capsule Take 1 capsule (50,000 Units total) by mouth every 7 (seven) days. 09/05/20   Mosie Lukes, MD    Physical Exam: Vitals:   01/14/21 0500 01/14/21 0541 01/14/21 0737 01/14/21 1127  BP: (!) 142/63 (!) 161/81 (!) 141/66 (!) 170/72  Pulse: (!) 28 63 (!) 46 (!) 43  Resp: 14 17 17 18   Temp:   98.5 F (36.9 C) 97.9 F (36.6 C)  TempSrc:   Oral Oral  SpO2: 98% 100%  97%  Weight:      Height:         . General:  Appears calm and comfortable and is in NAD, conversant but mildly forgetful at times . Eyes:   EOMI, normal lids, iris . ENT:  grossly normal hearing, lips & tongue, mmm; appropriate dentition . Neck:  no LAD, masses or thyromegaly . Cardiovascular:  RR with bradycardia in 40-50 range, no m/r/g. No LE edema.  Marland Kitchen Respiratory:   CTA bilaterally with no wheezes/rales/rhonchi.  Normal respiratory effort. . Abdomen:  soft, NT, ND, NABS . Skin:  no rash or induration seen on limited exam . Musculoskeletal:  grossly normal tone BUE/BLE, good ROM, no bony abnormality . Psychiatric:  grossly normal mood and  affect, speech fluent and appropriate, AOx3, occasionally forgets conversation . Neurologic:  CN 2-12 grossly intact, moves all extremities in coordinated fashion    Radiological Exams on Admission: Independently reviewed - see discussion in A/P where applicable  No results found.  EKG: Independently reviewed.  Sinus bradycardia with 2nd degree AV block with rate 33; LVH; incomplete LBBB; no evidence of acute ischemia   Labs on Admission: I have personally reviewed the available labs and imaging studies at the time of the admission.  Pertinent labs:   Glucose 109 Normal CBC COVID/flu negative   Assessment/Plan  Principal Problem:   Symptomatic bradycardia Active Problems:   Hypothyroid   Hyperlipidemia   Hyperkalemia   Mild cognitive impairment   Symptomatic bradycardia -This appears to be chronic but may have worsened after she took Kayexalate yesterday -HR appears to have settled in the 40-50s range -Consult to cardiology/EP -Patient was admitted to cardiac telemetry in anticipation of need for pacer -Pacer may need to be considered, awaiting EP input  Hyperkalemia -Consider Lokelma instead of Kayexalate to decrease vagal effects -K+ is currently 3.6  Hypothyroidism -Recent normal TSH -Continue Synthroid at current dose for now  HLD -She does not appear to be taking medications for this issue at this time  Mild cognitive impairment -Patient had noticeable memory impairment periodically during evaluation today -She reports having increasing "senior moments" -Encourage PCP evaluation for possible early dementia  DNR -I have discussed code status with the patient and she would not desire resuscitation and would prefer to die a natural death should that situation arise. -She will need a gold out of facility DNR form provided by her PCP after further outpatient discussion.    Note: This patient has been tested and is negative for the novel coronavirus  COVID-19. The patient has been fully vaccinated against COVID-19.   Level of care: Telemetry Cardiac DVT prophylaxis:  SCDs Code Status:  Full - confirmed with patient/family Family Communication: None present; I spoke with the patient's husband by telephone at the time of admission. Disposition Plan:  The patient is from: home  Anticipated d/c is to: home without [facility] services once her cardiology issues have been resolved.  Anticipated d/c date will depend on clinical response to treatment, but likely 1-2 days  Patient is currently: acutely ill Consults called: Cardiology Admission status: It is my clinical opinion that referral for OBSERVATION is reasonable and necessary in this patient based on the above information provided. The aforementioned taken together are felt to place the patient at high risk for further clinical deterioration. However it is anticipated that the patient may be medically stable for discharge from the hospital within 24 to 48 hours.   Karmen Bongo MD [facility]   How to contact the [facility] Attending or Consulting provider [place] or covering provider during after hours [place], for this patient?  1. Check the care team in [facility] and look for a) attending/consulting TR[facility] provider listed and b) the [facility] team listed 2. Log into [email or web address] and use [place]'s universal password to access. If you do not have the password, please contact the hospital operator. 3. Locate the [facility] provider you are looking for under [facility] and page to a number that you can be directly reached. 4. If you still have difficulty reaching the provider, please page the [facility] (Director on Call) for the Hospitalists listed on amion for assistance.   01/14/2021, 1:58 PM

## 2021-01-14 NOTE — ED Notes (Signed)
Pt transferred to [place] via [facility] 

## 2021-01-14 NOTE — ED Provider Notes (Signed)
[place] EMERGENCY DEPARTMENT Provider Note   CSN: [id number] Arrival date & time: 01/14/21  0203     History Chief Complaint  Patient presents with  . Medication Reaction    Cynthia Young is a 87 y.o. female.  HPI     This is an 87 year old female with a history of hypothyroidism, hyperlipidemia who presents with an episode of dizziness and headache.  Patient reports that she took Kayexalate for the first time last night for some hyperkalemia.  She was prescribed this by her primary physician.  She states that around 9:00 she began to feel dizzy and lightheaded.  Discontinued.  She also reported some headache.  She called her daughter.  She states that she felt nauseous and had some abdominal cramping.  She subsequently had a large bowel movement and most of her abdominal symptoms improved.  However she has a persistent lightheadedness and headache.  She denies any chest pain or shortness of breath.  No fevers or recent medication changes otherwise.  During triage, patient noted to have a heart rate persistently in the 30s.  EKG showed sinus bradycardia with Mobitz type I.  Patient reports history of slow heart rate in the past.  She denies any AV nodal blockade including beta-blockers.  I reviewed the patient's chart.  Do not see any documented heart rates as low as the 30s.  However, she has noted to be in sinus bradycardia in the 40s to 50s during prior hospitalizations.  She followed up with cardiology who recommended avoiding AV nodal blockade and monitoring for symptoms.    Past Medical History:  Diagnosis Date  . Arthritis   . Hyperglycemia 09/04/2020  . Hyperlipidemia   . Hypothyroid     Patient Active Problem List   Diagnosis Date Noted  . Pyrosis 09/20/2020  . Gastric ulcer without hemorrhage or perforation 09/20/2020  . History of choledocholithiasis 09/20/2020  . Abnormal CT scan, sigmoid colon 09/20/2020  . History of ERCP 09/20/2020  . Hyperkalemia  09/07/2020  . Hyperglycemia 09/04/2020  . Hypocalcemia 09/04/2020  . Acute cholecystitis 04/09/2020  . Varicose veins of both lower extremities 08/03/2019  . Hiatal hernia with GERD 03/16/2019  . Myalgia 09/25/2018  . Preventative health care 03/30/2018  . Hypothyroid   . Hyperlipidemia     Past Surgical History:  Procedure Laterality Date  . BACK SURGERY    . BIOPSY  04/12/2020   Procedure: BIOPSY;  Surgeon: Rush Landmark Telford Nab., MD;  Location: [facility];  Service: Gastroenterology;;  . Lillard Anes Left   . CHOLECYSTECTOMY N/A 04/11/2020   Procedure: LAPAROSCOPIC CHOLECYSTECTOMY WITH INTRAOPERATIVE CHOLANGIOGRAM;  Surgeon: Alphonsa Overall, MD;  Location: [facility];  Service: General;  Laterality: N/A;  . ERCP N/A 04/12/2020   Procedure: ENDOSCOPIC RETROGRADE CHOLANGIOPANCREATOGRAPHY (ERCP);  Surgeon: Irving Copas., MD;  Location: [facility];  Service: Gastroenterology;  Laterality: N/A;  . FOOT SURGERY Right    bunion  . REMOVAL OF STONES  04/12/2020   Procedure: REMOVAL OF STONES;  Surgeon: Rush Landmark Telford Nab., MD;  Location: [facility];  Service: Gastroenterology;;  . [name]  04/12/2020   Procedure: [name];  Surgeon: Rush Landmark Telford Nab., MD;  Location: [facility];  Service: Gastroenterology;;     OB History    Gravida  3   Para  3   Term      Preterm      AB      Living        SAB      IAB  Ectopic      Multiple      Live Births              Family History  Problem Relation Age of Onset  . Cancer Mother   . Heart disease Father   . Arthritis Father        rheumatoid  . Cancer Brother        colon  . Colon cancer Brother   . Esophageal cancer Neg Hx   . Inflammatory bowel disease Neg Hx   . Liver disease Neg Hx   . Pancreatic cancer Neg Hx   . Stomach cancer Neg Hx     Social History   Tobacco Use  . Smoking status: Never Smoker  . Smokeless tobacco: Never Used  Vaping Use  . Vaping Use: Never  used  Substance Use Topics  . Alcohol use: Yes    Comment: socially  . Drug use: Never    Home Medications Prior to Admission medications   Medication Sig Start Date End Date Taking? Authorizing Provider  levothyroxine (SYNTHROID) 50 MCG tablet TAKE ONE TABLET BY MOUTH ONE TIME DAILY 09/08/20   Mosie Lukes, MD  omeprazole (PRILOSEC) 40 MG capsule Take 1 capsule (40 mg total) by mouth daily. 06/24/20   Noralyn Pick, NP  sodium polystyrene (KAYEXALATE) 15 GM/60ML suspension Take 60 mLs (15 g total) by mouth daily as needed (hyperkalemia). 01/12/21   Mosie Lukes, MD  Vitamin D, Ergocalciferol, (DRISDOL) 1.25 MG (50000 UNIT) CAPS capsule Take 1 capsule (50,000 Units total) by mouth every 7 (seven) days. 09/05/20   Mosie Lukes, MD    Allergies    Patient has no known allergies.  Review of Systems   Review of Systems  Constitutional: Negative for fever.  Respiratory: Negative for shortness of breath.   Cardiovascular: Negative for chest pain.  Gastrointestinal: Negative for abdominal pain, diarrhea and vomiting.  Genitourinary: Negative for dysuria.  Neurological: Positive for light-headedness and headaches. Negative for syncope.  All other systems reviewed and are negative.   Physical Exam Updated Vital Signs BP (!) 162/89   Pulse (!) 32   Temp (!) 97.5 F (36.4 C) (Oral)   Resp 16   Ht 1.473 m (4\' 10" )   Wt 52 kg   SpO2 99%   BMI 23.96 kg/m   Physical Exam Vitals and nursing note reviewed.  Constitutional:      Appearance: She is well-developed and well-nourished.     Comments: Well-appearing for age, no acute distress  HENT:     Head: Normocephalic and atraumatic.     Nose: Nose normal.     Mouth/Throat:     Mouth: Mucous membranes are moist.  Eyes:     Pupils: Pupils are equal, round, and reactive to light.  Cardiovascular:     Rate and Rhythm: Regular rhythm. Bradycardia present.     Heart sounds: Normal heart sounds.  Pulmonary:      Effort: Pulmonary effort is normal. No respiratory distress.     Breath sounds: No wheezing.  Abdominal:     General: Bowel sounds are normal.     Palpations: Abdomen is soft.  Musculoskeletal:     Cervical back: Neck supple.  Skin:    General: Skin is warm and dry.  Neurological:     Mental Status: She is alert and oriented to person, place, and time.     Comments: Cranial nerves II through XII intact, 5 out of 5 strength  in all 4 extremities, no dysmetria to finger-nose-finger  Psychiatric:        Mood and Affect: Mood and affect and mood normal.     ED Results / Procedures / Treatments   Labs (all labs ordered are listed, but only abnormal results are displayed) Labs Reviewed  BASIC METABOLIC PANEL - Abnormal; Notable for the following components:      Result Value   Glucose, Bld 109 (*)    All other components within normal limits  CBC WITH DIFFERENTIAL/PLATELET  MAGNESIUM  TSH    EKG EKG Interpretation  Date/Time:  Wednesday January 14 2021 02:24:13 EST Ventricular Rate:  33 PR Interval:    QRS Duration: 110 QT Interval:  477 QTC Calculation: 354 R Axis:   -58 Text Interpretation: Sinus bradycardia with 2nd degree A-V block (Mobitz I) Atrial premature complex Incomplete left bundle branch block Left ventricular hypertrophy Baseline wander in lead(s) V2 Confirmed by Thayer Jew [phone]) on 01/14/2021 2:50:52 AM   Radiology No results found.  Procedures Procedures   Medications Ordered in ED Medications - No data to display  ED Course  I have reviewed the triage vital signs and the nursing notes.  Pertinent labs & imaging results that were available during my care of the patient were reviewed by me and considered in my medical decision making (see chart for details).    MDM Rules/Calculators/A&P                          Patient presents with dizziness, headache, abdominal cramping that resolved with large bowel movement at home.  She is overall  nontoxic.  She has fairly persistent bradycardia.  While I was in the room pulse rates in the 30s persistently for greater than 5 minutes.  She did have occasional episodes where her heart rate would go into the mid 40s.  She has persistent headache and dizziness but her abdominal symptoms have resolved.  Question whether her episode may be related to bradycardia and AV nodal disease as opposed to medications.  Lab work obtained.  Potassium now normal.  Would avoid further Kayexalate.  Magnesium normal.  TSH is pending.  Spoke with cardiology fellow who agrees patient likely warrants EP evaluation for pacemaker placement.  Will place pads and consult the hospitalist for admission.  3:19 AM Spoke with cardiology.  Agrees patient likely warrants an EP evaluation.  Recommends admission to the hospitalist. Final Clinical Impression(s) / ED Diagnoses Final diagnoses:  AV block, Mobitz 1  Dizziness    Rx / DC Orders ED Discharge Orders    None       Gurnoor Ursua, Barbette Hair, MD 01/14/21 [phone]

## 2021-01-14 NOTE — Progress Notes (Signed)
Pt. Discharging to home via private vehicle. D/c orders reviewed with patient. She will follow up out patient with cardiology. Personal belongings sent home with patient who is accompanied by her daughters. Fuller Canada, RN

## 2021-01-14 NOTE — ED Notes (Signed)
ED Provider at bedside. 

## 2021-01-14 NOTE — ED Notes (Signed)
Pt noted to be bradycardic during triage. EKG handed to Dr. Dina Rich and made aware of bradycardia.

## 2021-01-14 NOTE — Discharge Summary (Signed)
Discharge Summary  Cynthia Young ZDG:[id number] DOB: 1930-09-24  PCP: Mosie Lukes, MD  Admit date: 01/14/2021 Discharge date: 01/14/2021   Time spent: 35 minutes  Admitted From: Home Disposition:  Home  Recommendations for Outpatient Follow-up:  1. Follow up with PCP in 1 week - needs recheck on BMP and discussion about heart rate; potassium; and code status. 2. Follow up with cardiology as recommended.    Discharge Diagnoses:  Active Hospital Problems   Diagnosis Date Noted  . Symptomatic bradycardia 01/14/2021  . Mild cognitive impairment 01/14/2021  . Hyperkalemia 09/07/2020  . Hypothyroid   . Hyperlipidemia     Resolved Hospital Problems  No resolved problems to display.    Discharge Condition: Stable  CODE STATUS: DNR Diet recommendation:  Regular  Vitals:   01/14/21 0737 01/14/21 1127  BP: (!) 141/66 (!) 170/72  Pulse: (!) 46 (!) 43  Resp: 17 18  Temp: 98.5 F (36.9 C) 97.9 F (36.6 C)  SpO2:  97%    History of present illness:  Cynthia Young is a 87 y.o. year old female with medical history significant for hypothyroidism and dyslipidemia who presented on 01/14/2021 with dizziness and was found to have symptomatic bradycardia. Remaining hospital course addressed in problem based format below:   Hospital Course:   Symptomatic bradycardia -This appears to be chronic but may have worsened after she took Kayexalate yesterday -HR appears to have settled in the 40-50s range -Consult to cardiology/EP -Patient was admitted to cardiac telemetry in anticipation of need for pacer -Pacer may need to be considered in the future, but EP reports that there is no indication for pacing at this time. -Patient should continue to avoid all potential AV nodal blocking agents  Hyperkalemia -Consider Lokelma instead of Kayexalate to decrease vagal effects -K+ is currently 3.6  Hypothyroidism -Recent normal TSH -Continue Synthroid at current dose for  now  HLD -She does not appear to be taking medications for this issue at this time  Mild cognitive impairment -Patient had noticeable memory impairment periodically during evaluation today -She reports having increasing "senior moments" -Encourage PCP evaluation for possible early dementia  DNR -I have discussed code status with the patient and she would not desire resuscitation and would prefer to die a natural death should that situation arise. -She will need a gold out of facility DNR form provided by her PCP after further outpatient discussion.   Consultations:  Cardiology/EP  Procedures/Studies: None  Discharge Exam: BP (!) 170/72 (BP Location: Left Arm)   Pulse (!) 43   Temp 97.9 F (36.6 C) (Oral)   Resp 18   Ht 4\' 10"  (1.473 m)   Wt 52 kg   SpO2 97%   BMI 23.96 kg/m   Patient was examined earlier this AM and exam is unchanged at this time.   Discharge Instructions Follow up: Please make an appointment to see your primary physician for follow up within 7 days of hospital discharge.  At that appointment:  -We routinely change or add medications that can affect your baseline labs and fluid status; therefore, you may require repeat blood work or tests during your next visit with your PCP.  Your PCP may decide not to get them or may add new tests based on their clinical decision.  -Please get all medicines reviewed and adjusted.  -Please request that your primary physician go over all hospital tests and procedure/radiological results at the follow up.  Please get all hospital records sent to your physician by  signing a hospital release before you go home.  Activity: As tolerated with fall precautions; use walker/cane & assistance as needed.  Disposition: Home    Diet:   Heart Healthy.  For Heart failure patients - Check your weight at the same time daily.  If you gain over 2 pounds, develop leg swelling, or experience more shortness of breath/chest pain, call  your Primary MD immediately. Follow cardiac low salt diet with no more than 1.5 liters/day of fluid.  For all patients - If you experience worsening of your admission symptoms or develop shortness of breath, life threatening emergency, suicidal or homicidal thoughts you must seek medical attention immediately by calling 911 or calling your MD immediately.  Read complete instructions along with all the possible side effects for all the medicines you take and that have been prescribed to you. Take any new medicines after you have completely understood and accept all the possible adverse reactions/side effects.   Do not drive, operate heavy machinery, perform activities at heights, swimming or participation in water activities or provide baby sitting services if your were admitted for syncope/seizures until you have seen by Primary MD/Neurologist and advised to do so.  Do not drive when taking pain medications.    Do not take more than prescribed pain, sleep and anxiety medications.  Special Instructions: If you have smoked or chewed Tobacco  in the last 2 yrs please stop smoking; also stop any regular Alcohol and/or any Recreational drug use including marijuana.  Wear Seat belts while driving.   Please note:  You were cared for by a hospitalist during your hospital stay. If you have any questions about your discharge medications or the care you received while you were in the hospital, you can call the unit and asked to speak with the hospitalist on call. Once you are discharged, your primary care physician will handle any further medical issues. Please note that NO REFILLS for any discharge medications will be authorized, as it is imperative that you return to your primary care physician (or establish a relationship with a primary care physician if you do not have one) for your aftercare needs so that they can reassess your need for medications and monitor your lab values.  Discharge Instructions     Call MD for:  extreme fatigue   Complete by: As directed    Call MD for:  persistant dizziness or light-headedness   Complete by: As directed    Diet - low sodium heart healthy   Complete by: As directed    Discharge instructions   Complete by: As directed    Follow up: Please make an appointment to see your primary physician for follow up within 7 days of hospital discharge.  At that appointment:  -We routinely change or add medications that can affect your baseline labs and fluid status; therefore, you may require repeat blood work or tests during your next visit with your PCP.  Your PCP may decide not to get them or may add new tests based on their clinical decision.  -Please get all medicines reviewed and adjusted.  -Please request that your primary physician go over all hospital tests and procedure/radiological results at the follow up.  Please get all hospital records sent to your physician by signing a hospital release before you go home.  Activity: As tolerated with fall precautions; use walker/cane & assistance as needed.  Disposition: Home   Diet: Heart Healthy.  For Heart failure patients - Check your weight at  the same time daily.  If you gain over 2 pounds, develop leg swelling, or experience more shortness of breath/chest pain, call your Primary MD immediately. Follow cardiac low salt diet with no more than 1.5 liters/day of fluid.  For all patients - If you experience worsening of your admission symptoms or develop shortness of breath, life threatening emergency, suicidal or homicidal thoughts you must seek medical attention immediately by calling 911 or calling your MD immediately.  Read complete instructions along with all the possible side effects for all the medicines you take and that have been prescribed to you. Take any new medicines after you have completely understood and accept all the possible adverse reactions/side effects.   Do not drive, operate heavy  machinery, perform activities at heights, swimming or participation in water activities or provide baby sitting services if your were admitted for syncope/seizures until you have seen by Primary MD/Neurologist and advised to do so.  Do not drive when taking pain medications.   Do not take more than prescribed pain, sleep and anxiety medications.  Special Instructions: If you have smoked or chewed Tobacco in the last 2 yrs please stop smoking; also stop any regular Alcohol and/or any Recreational drug use including marijuana.  Wear Seat belts while driving.  Please note:  You were cared for by a hospitalist during your hospital stay. If you have any questions about your discharge medications or the care you received while you were in the hospital, you can call the unit and asked to speak with the hospitalist on call. Once you are discharged, your primary care physician will handle any further medical issues. Please note that NO REFILLS for any discharge medications will be authorized, as it is imperative that you return to your primary care physician (or establish a relationship with a primary care physician if you do not have one) for your aftercare needs so that they can reassess your need for medications and monitor your lab values.   Increase activity slowly   Complete by: As directed       No Known Allergies  Follow-up Information    O'Neal, Cassie Freer, MD Follow up.   Specialties: Internal Medicine, Cardiology, Radiology Why: 02/11/21 @ 9:00AM Contact information: [place] [address] [phone]        Constance Haw, MD Follow up.   Specialty: Cardiology Why: 02/23/21 @ 8:45AM Contact information: [place] [address] [phone]                The results of significant diagnostics from this hospitalization (including imaging, microbiology, ancillary and laboratory) are listed below for reference.     Significant Diagnostic Studies: No results found.  Microbiology: Recent Results (from the past 240 hour(s))  Resp Panel by RT-PCR (Flu A&B, Covid) Nasopharyngeal Swab     Status: None   Collection Time: 01/14/21  3:25 AM   Specimen: Nasopharyngeal Swab; Nasopharyngeal(NP) swabs in vial transport medium  Result Value Ref Range Status   SARS Coronavirus 2 by RT PCR NEGATIVE NEGATIVE Final    Comment: (NOTE) SARS-CoV-2 target nucleic acids are NOT DETECTED.  The SARS-CoV-2 RNA is generally detectable in upper respiratory specimens during the acute phase of infection. The lowest concentration of SARS-CoV-2 viral copies this assay can detect is 138 copies/mL. A negative result does not preclude SARS-Cov-2 infection and should not be used as the sole basis for treatment or other patient management decisions. A negative result may occur  with  improper specimen collection/handling, submission of specimen other than nasopharyngeal swab, presence of viral mutation(s) within the areas targeted by this assay, and inadequate number of viral copies(<138 copies/mL). A negative result must be combined with clinical observations, patient history, and epidemiological information. The expected result is Negative.  Fact Sheet for Patients:  EntrepreneurPulse.com.au  Fact Sheet for Healthcare Providers:  IncredibleEmployment.be  This test is no t yet approved or cleared by the [place] FDA and  has been authorized for detection and/or diagnosis of SARS-CoV-2 by FDA under an Emergency Use Authorization (EUA). This EUA will remain  in effect (meaning this test can be used) for the duration of the COVID-19 declaration under Section 564(b)(1) of the Act, 21 U.S.C.section 360bbb-3(b)(1), unless the authorization is terminated  or revoked sooner.       Influenza A by PCR NEGATIVE NEGATIVE Final   Influenza B by PCR NEGATIVE NEGATIVE Final    Comment:  (NOTE) The Xpert Xpress SARS-CoV-2/FLU/RSV plus assay is intended as an aid in the diagnosis of influenza from Nasopharyngeal swab specimens and should not be used as a sole basis for treatment. Nasal washings and aspirates are unacceptable for Xpert Xpress SARS-CoV-2/FLU/RSV testing.  Fact Sheet for Patients: EntrepreneurPulse.com.au  Fact Sheet for Healthcare Providers: IncredibleEmployment.be  This test is not yet approved or cleared by the [place] FDA and has been authorized for detection and/or diagnosis of SARS-CoV-2 by FDA under an Emergency Use Authorization (EUA). This EUA will remain in effect (meaning this test can be used) for the duration of the COVID-19 declaration under Section 564(b)(1) of the Act, 21 U.S.C. section 360bbb-3(b)(1), unless the authorization is terminated or revoked.  Performed at [facility], [address] [place], [place] [address]      Labs: Basic Metabolic Panel: Recent Labs  Lab 01/12/21 1009 01/14/21 0230  NA 140 137  K 5.8 No hemolysis seen* 3.6  CL 104 103  CO2 30 24  GLUCOSE 93 109*  BUN 20 20  CREATININE 0.96 0.82  CALCIUM 10.2 9.1  MG  --  2.1   Liver Function Tests: Recent Labs  Lab 01/12/21 1009  AST 14  ALT 9  ALKPHOS 66  BILITOT 0.5  PROT 6.8  ALBUMIN 4.2   No results for input(s): LIPASE, AMYLASE in the last 168 hours. No results for input(s): AMMONIA in the last 168 hours. CBC: Recent Labs  Lab 01/12/21 1009 01/14/21 0230  WBC 7.5 7.9  NEUTROABS  --  3.9  HGB 14.3 14.4  HCT 43.3 43.8  MCV 92.3 93.4  PLT 251.0 266   Cardiac Enzymes: No results for input(s): CKTOTAL, CKMB, CKMBINDEX, TROPONINI in the last 168 hours. BNP: BNP (last 3 results) No results for input(s): BNP in the last 8760 hours.  ProBNP (last 3 results) No results for input(s): PROBNP in the last 8760 hours.  CBG: No results for input(s): GLUCAP in the last 168  hours.     Signed:  Karmen Bongo, MD [facility] 01/14/2021, 2:09 PM

## 2021-01-14 NOTE — Consult Note (Addendum)
Cardiology Consultation:   Patient ID: Cynthia Young MRN: [id number]; DOB: 1930/09/17  Admit date: 01/14/2021 Date of Consult: 01/14/2021  PCP:  Mosie Lukes, Evan  Cardiologist:  Evalina Field, MD    Patient Profile:   Cynthia Young is a 87 y.o. female with a hx of GERD, hiatal hernia, HLD, venous insufficiency, known Mobitz one, hypothyroidism,  who is being seen today for the evaluation of heart block, bradycardia at the request of Dr. Tonie Griffith.  History of Present Illness:   Cynthia Young saw Dr. Marisue Young last 05/05/20, following uop on atypical CP that was better s/p cholecystectomy.  She was referred back to him 2/2 slow HR reported in the hospital with her procedure. In his review, LAFB and Mobitz I noted, asymptomatic with no hx of brady symptoms.  Planned to get echo, counseled on symptoms of bradycardia, planned to f/u annually pending her echo.  She was admitted to [facility] this AM for what was suspect to be a medication reaction.  She was rx kayexalate by her PMD.  Sometime after taking the dose she reported developing dizziness, headache, subsequently nauseous, abd cramping  Followed by large BM Headache and some dizziness continued. She sought medical attention. ER MD note reports rates in the 30's at times. otherwise 40's-50s In d/w on call cardiology recommended admission and EP consult  LABS  01/12/21  K+ was 5.8 TSH 2.80  01/14/21 K+ 3.6 BUN/Creat 20/0.82 Mag 2.1 WBC 7.9 H/H 14/43 Plts 266  The patient is very pleasant, but a bit tangential and time line is difficult with some of her symptoms. She reports an aching in her chest that she has noticed since she has lived here, about 3 years, often worsened after dinner. Seems there is a component of a persistant sching as well, again, does not seem new or escalating, bot exertionally worsened or triggered. She tells me her physical limitation is her neuropathy and LE pain. Denies  SOB  No near syncope or syncope historically or with this admission.  She tells me that she took the Kayexalate at 7pm, about 9 or so started tofeel bad, became quite worried that she was having a reaction to the medicine and suspected she had been instructed to take too much. " I just felt bad" rubbing her belly.  When I ask if it was belly pain she said not really, just bad, maybe dizzy, something "just was off".  She mentioned having a head ache but says headaches are not new for her and this headache is not changed from her usual headaches.   Eventually she called her daughter perhaps about Midnight and says that while her daughter was coming over she acutely felt abdominal fullness, urgency and had a large BM, and did start to feel a bit better, but decided to come in she says mostly because she thought she was given too much medicine.  AT no time did she feel like she was going to faint, but maybe dizzy.   She had a fall last summer that resulted in a compression fracture, was rushing to the bathroom and as she swung into the bathroom fell, she recall the fall and hitting the ground does not think she fainted.    Past Medical History:  Diagnosis Date  . Arthritis   . Hyperglycemia 09/04/2020  . Hyperlipidemia   . Hypothyroid     Past Surgical History:  Procedure Laterality Date  . BACK SURGERY    .  BIOPSY  04/12/2020   Procedure: BIOPSY;  Surgeon: Rush Landmark Telford Nab., MD;  Location: [facility];  Service: Gastroenterology;;  . Lillard Anes Left   . CHOLECYSTECTOMY N/A 04/11/2020   Procedure: LAPAROSCOPIC CHOLECYSTECTOMY WITH INTRAOPERATIVE CHOLANGIOGRAM;  Surgeon: Alphonsa Overall, MD;  Location: [facility];  Service: General;  Laterality: N/A;  . ERCP N/A 04/12/2020   Procedure: ENDOSCOPIC RETROGRADE CHOLANGIOPANCREATOGRAPHY (ERCP);  Surgeon: Irving Copas., MD;  Location: [facility];  Service: Gastroenterology;  Laterality: N/A;  . FOOT SURGERY Right    bunion  .  REMOVAL OF STONES  04/12/2020   Procedure: REMOVAL OF STONES;  Surgeon: Rush Landmark Telford Nab., MD;  Location: [facility];  Service: Gastroenterology;;  . [name]  04/12/2020   Procedure: [name];  Surgeon: Rush Landmark Telford Nab., MD;  Location: [facility];  Service: Gastroenterology;;     Home Medications:  Prior to Admission medications   Medication Sig Start Date End Date Taking? Authorizing Provider  levothyroxine (SYNTHROID) 50 MCG tablet TAKE ONE TABLET BY MOUTH ONE TIME DAILY Patient taking differently: Take 50 mcg by mouth daily before breakfast. 09/08/20  Yes Mosie Lukes, MD  omeprazole (PRILOSEC OTC) 20 MG tablet Take 20 mg by mouth daily as needed (acid reflux).   Yes [provider]  sodium polystyrene (KAYEXALATE) 15 GM/60ML suspension Take 60 mLs (15 g total) by mouth daily as needed (hyperkalemia). 01/12/21  Yes Mosie Lukes, MD  Vitamin D, Ergocalciferol, (DRISDOL) 1.25 MG (50000 UNIT) CAPS capsule Take 1 capsule (50,000 Units total) by mouth every 7 (seven) days. Patient not taking: No sig reported 09/05/20   Mosie Lukes, MD    Inpatient Medications: Scheduled Meds: . docusate sodium  100 mg Oral BID  . levothyroxine  50 mcg Oral Daily  . pantoprazole  40 mg Oral Daily  . sodium chloride flush  3 mL Intravenous Q12H   Continuous Infusions: . lactated ringers 75 mL/hr at 01/14/21 0851   PRN Meds: acetaminophen **OR** acetaminophen, bisacodyl, hydrALAZINE, HYDROcodone-acetaminophen, morphine injection, ondansetron **OR** ondansetron (ZOFRAN) IV, polyethylene glycol  Allergies:   No Known Allergies  Social History:   Social History   Socioeconomic History  . Marital status: Widowed    Spouse name: Not on file  . Number of children: Not on file  . Years of education: Not on file  . Highest education level: Not on file  Occupational History  . Not on file  Tobacco Use  . Smoking status: Never Smoker  . Smokeless tobacco:  Never Used  Vaping Use  . Vaping Use: Never used  Substance and Sexual Activity  . Alcohol use: Yes    Comment: socially  . Drug use: Never  . Sexual activity: Not Currently  Other Topics Concern  . Not on file  Social History Narrative   worked in [employer], short time. No cigarettes or drug use. Live by self no dietary restictions and walks daily   Social Determinants of Health   Financial Resource Strain: Low Risk   . Difficulty of Paying Living Expenses: Not very hard  Food Insecurity: Not on file  Transportation Needs: Not on file  Physical Activity: Not on file  Stress: Not on file  Social Connections: Not on file  Intimate Partner Violence: Not on file    Family History:   Family History  Problem Relation Age of Onset  . Cancer Mother   . Heart disease Father   . Arthritis Father        rheumatoid  . Cancer  Brother        colon  . Colon cancer Brother   . Esophageal cancer Neg Hx   . Inflammatory bowel disease Neg Hx   . Liver disease Neg Hx   . Pancreatic cancer Neg Hx   . Stomach cancer Neg Hx      ROS:  Please see the history of present illness.  All other ROS reviewed and negative.     Physical Exam/Data:   Vitals:   01/14/21 0430 01/14/21 0500 01/14/21 0541 01/14/21 0737  BP: (!) 175/81 (!) 142/63 (!) 161/81 (!) 141/66  Pulse: 61 (!) 28 63 (!) 46  Resp: (!) 23 14 17 17   Temp:    98.5 F (36.9 C)  TempSrc:    Oral  SpO2: 98% 98% 100%   Weight:      Height:       No intake or output data in the 24 hours ending 01/14/21 1025 Last 3 Weights 01/14/2021 09/19/2020 09/04/2020  Weight (lbs) 114 lb 10.2 oz 111 lb 109 lb 3.2 oz  Weight (kg) 52 kg 50.349 kg 49.533 kg     Body mass index is 23.96 kg/m.  General:  Well nourished, well developed, in no acute distress HEENT: normal Lymph: no adenopathy Neck: no JVD Endocrine:  No thryomegaly Vascular: No carotid bruits Cardiac:  RRR; no murmurs, gallops or rubs Lungs:  CTA b/l, no wheezing,  rhonchi or rales  Abd: soft, nontender  Ext: no edema Musculoskeletal:  No deformities Skin: warm and dry  Neuro:  no focal abnormalities noted Psych:  Normal affect   EKG:  The EKG was personally reviewed and demonstrates:   Evidence of Mobitz one, with 2:1 conduction, 33bpm, QRS 131ms, LAD  OLD 05/05/20 is SR 54, 1st degree AVblock, 226ms,  LAD 04/09/20 is SB 55, Mobitz one 10/18/2019: SR 55bpm, Mobitz one  Telemetry:  Telemetry was personally reviewed and demonstrates:   SR, mobitz one block with periods of 2:1 conduction, rates largely 40's, some to 60's  Relevant CV Studies:  05/23/20: TTE IMPRESSIONS  1. Mobitz II AVB was noted during the study  2. Left ventricular ejection fraction, by estimation, is 65 to 70%. The  left ventricle has normal function. The left ventricle has no regional  wall motion abnormalities. Left ventricular diastolic parameters are  consistent with Grade I diastolic  dysfunction (impaired relaxation).  3. Right ventricular systolic function is normal. The right ventricular  size is normal. There is normal pulmonary artery systolic pressure. The  estimated right ventricular systolic pressure is 85.8 mmHg.  4. The mitral valve is grossly normal. Trivial mitral valve  regurgitation.  5. The aortic valve is tricuspid. Aortic valve regurgitation is not  visualized.  6. The inferior vena cava is normal in size with greater than 50%  respiratory variability, suggesting right atrial pressure of 3 mmHg.   Laboratory Data:  High Sensitivity Troponin:  No results for input(s): TROPONINIHS in the last 720 hours.   Chemistry Recent Labs  Lab 01/12/21 1009 01/14/21 0230  NA 140 137  K 5.8 No hemolysis seen* 3.6  CL 104 103  CO2 30 24  GLUCOSE 93 109*  BUN 20 20  CREATININE 0.96 0.82  CALCIUM 10.2 9.1  GFRNONAA  --  >60  ANIONGAP  --  10    Recent Labs  Lab 01/12/21 1009  PROT 6.8  ALBUMIN 4.2  AST 14  ALT 9  ALKPHOS 66  BILITOT 0.5    Hematology Recent Labs  Lab 01/12/21 1009 01/14/21 0230  WBC 7.5 7.9  RBC 4.69 4.69  HGB 14.3 14.4  HCT 43.3 43.8  MCV 92.3 93.4  MCH  --  30.7  MCHC 33.0 32.9  RDW 13.4 13.1  PLT 251.0 266   BNPNo results for input(s): BNP, PROBNP in the last 168 hours.  DDimer No results for input(s): DDIMER in the last 168 hours.   Radiology/Studies:  No results found.   Assessment and Plan:   1. Mobitz one block     LAD  She has longstanding and appears stable conduction system disease Observed rates to the 30's in the ER reported, admission EKG mostly 2:1 though evidence of Mobitz one again HR climbs nicely and has more persistent 1:1 conduction with minimal activity in bed, chatting with [place]  She had no near syncope or syncope. Symptoms from Kayexalate likely did provoke vagally mediated worsening of her HRs At baseline she is asymptomatic  Dr. Curt Bears has seen and examined her, we have  Reviewed her EKGs, telemetry No indication for pacing at this time Continue to avoid all potential nodal blocking agents  Should she devlope symptoms of bradycardia would revisit pacing  2. CP     Atypical     Associated sometimes with swalling, after meals especially     Generalized baseling ache, not associated with exertion or position.  Follow up with Dr. Marisue Young as planned previously  3. Hyperkalemia     Deferred to medicine team   Risk Assessment/Risk Scores:  { For questions or updates, please contact [facility] Please consult [email or web address] for contact info under    Signed, Baldwin Jamaica, PA-C  01/14/2021 10:25 AM  I have seen and examined this patient with Tommye Standard.  Agree with above, note added to reflect my findings.  On exam, RRR, no murmurs.  Patient presented to the hospital with varied abdominal symptoms.  She was noted to be bradycardic.  On review of her bradycardia, this is Mobitz 1 AV block.  She has a history of Mobitz 1 heart block which remained stable  from 2020.  She is minimally symptomatic from this.  At this point, no pacing is indicated.  We Akisha Sturgill continue with current follow-up in cardiology clinic.  Jaaliyah Lucatero M. Amay Mijangos MD 01/14/2021 2:07 PM

## 2021-01-14 NOTE — Care Management Obs Status (Signed)
[facility] NOTIFICATION   Patient Details  Name: Cynthia Young MRN: [id number] Date of Birth: 08-Feb-1930   Medicare Observation Status Notification Given:  Yes    Dawayne Patricia, RN 01/14/2021, 2:52 PM

## 2021-01-14 NOTE — Care Management CC44 (Signed)
Condition Code 44 Documentation Completed  Patient Details  Name: Cynthia Young MRN: [id number] Date of Birth: 07/28/1930   Condition Code 44 given:  Yes Patient signature on Condition Code 44 notice:  Yes Documentation of 2 MD's agreement:  Yes Code 44 added to claim:  Yes    Dawayne Patricia, RN 01/14/2021, 2:52 PM

## 2021-01-14 NOTE — ED Notes (Signed)
Pt's daughter took pt's purse, shirt and bra home with her

## 2021-01-15 LAB — TSH: TSH: 5.752 u[IU]/mL — ABNORMAL HIGH (ref 0.350–4.500)

## 2021-01-16 ENCOUNTER — Other Ambulatory Visit (INDEPENDENT_AMBULATORY_CARE_PROVIDER_SITE_OTHER): Payer: Medicare Other

## 2021-01-16 ENCOUNTER — Other Ambulatory Visit: Payer: Self-pay

## 2021-01-16 ENCOUNTER — Telehealth: Payer: Self-pay

## 2021-01-16 DIAGNOSIS — E875 Hyperkalemia: Secondary | ICD-10-CM | POA: Diagnosis not present

## 2021-01-16 LAB — COMPREHENSIVE METABOLIC PANEL
ALT: 9 U/L (ref 0–35)
AST: 16 U/L (ref 0–37)
Albumin: 4 g/dL (ref 3.5–5.2)
Alkaline Phosphatase: 63 U/L (ref 39–117)
BUN: 17 mg/dL (ref 6–23)
CO2: 28 mEq/L (ref 19–32)
Calcium: 9.6 mg/dL (ref 8.4–10.5)
Chloride: 107 mEq/L (ref 96–112)
Creatinine, Ser: 0.85 mg/dL (ref 0.40–1.20)
GFR: 60.93 mL/min (ref >60.00)
Glucose, Bld: 92 mg/dL (ref 70–99)
Potassium: 5 mEq/L (ref 3.5–5.1)
Sodium: 141 mEq/L (ref 135–145)
Total Bilirubin: 0.5 mg/dL (ref 0.2–1.2)
Total Protein: 6.5 g/dL (ref 6.0–8.3)

## 2021-01-16 NOTE — Telephone Encounter (Signed)
Transition Care Management Follow-up Telephone Call  Date of discharge and from where: 01/14/2021-[facility]  How have you been since you were released from the hospital? Fine per daughter-Nancy  Any questions or concerns? No  Items Reviewed:  Did the pt receive and understand the discharge instructions provided? Yes   Medications obtained and verified? Yes   Other? Yes   Any new allergies since your discharge? No   Dietary orders reviewed? Yes  Do you have support at home? Yes   Home Care and Equipment/Supplies: Were home health services ordered? no If so, what is the name of the agency? n/a  Has the agency set up a time to come to the patient's home? not applicable Were any new equipment or medical supplies ordered?  No What is the name of the medical supply agency? n/a Were you able to get the supplies/equipment? not applicable Do you have any questions related to the use of the equipment or supplies? n/a  Functional Questionnaire: (I = Independent and D = Dependent) ADLs: I  Bathing/Dressing- I  Meal Prep- I  Eating- I  Maintaining continence- I  Transferring/Ambulation- I  Managing Meds- I  Follow up appointments reviewed:   PCP Hospital f/u appt confirmed? No Not yet-Spoke with daughter who was not with the patient at the time of call.Will call patient back to schedule.  [facility] f/u appt confirmed? Yes  Scheduled to see Dr. Audie Box on 02/11/2021 @ 9:00.  Are transportation arrangements needed? No   If their condition worsens, is the pt aware to call PCP or go to the Emergency Dept.? No  Was the patient provided with contact information for the PCP's office or ED? No  Was to pt encouraged to call back with questions or concerns? No

## 2021-01-21 NOTE — Patient Instructions (Incomplete)
It was good to see you again today, I will be in touch with your labs as soon as possible ?

## 2021-01-21 NOTE — Progress Notes (Deleted)
[facility] [facility] [address], [place], [place] [address] [phone]  Date:  01/22/2021   Name:  Cynthia Young   DOB:  1930/05/23   MRN:  [id number]  PCP:  Mosie Lukes, MD    Chief Complaint: No chief complaint on file.   History of Present Illness:  Cynthia Young is a 87 y.o. very pleasant female patient who presents with the following:  Patient here today for hospital follow-up visit-history of mild cognitive impairment, hypothyroidism, hyperlipidemia, hyperkalemia Primary patient of Dr. Charlett Blake  Admit date: 01/14/2021 Discharge date: 01/14/2021 Recommendations for Outpatient Follow-up:  1. Follow up with PCP in 1 week - needs recheck on BMP and discussion about heart rate; potassium; and code status. 2. Follow up with cardiology as recommended. Discharge Diagnoses:      Active Hospital Problems   Diagnosis Date Noted  . Symptomatic bradycardia 01/14/2021  . Mild cognitive impairment 01/14/2021  . Hyperkalemia 09/07/2020  . Hypothyroid   . Hyperlipidemia     Resolved Hospital Problems  No resolved problems to display.    CODE STATUS: DNR Diet recommendation:  Regular      Vitals:   01/14/21 0737 01/14/21 1127  BP: (!) 141/66 (!) 170/72  Pulse: (!) 46 (!) 43  Resp: 17 18  Temp: 98.5 F (36.9 C) 97.9 F (36.6 C)  SpO2:  97%    History of present illness:  Cynthia Young is a 87 y.o. year old female with medical history significant for hypothyroidism and dyslipidemia who presented on 01/14/2021 with dizziness and was found to have symptomatic bradycardia. Remaining hospital course addressed in problem based format below:   Hospital Course:   Symptomatic bradycardia -This appears to be chronic but may have worsened after she took Kayexalate yesterday -HR appears to have settled in the 40-50s range -Consult to cardiology/EP -Patient was admitted to cardiac telemetry in anticipation of need  for pacer -Pacer may need to be considered in the future, but EP reports that there is no indication for pacing at this time. -Patient should continue to avoid all potential AV nodal blocking agents  Hyperkalemia -Consider Lokelma instead of Kayexalate to decrease vagal effects -K+ is currently 3.6  Hypothyroidism -Recent normal TSH -Continue Synthroid at current dose for now  HLD -She does not appear to be taking medications for this issue at this time  Mild cognitive impairment -Patient had noticeable memory impairment periodically during evaluation today -She reports having increasing "senior moments" -Encourage PCP evaluation for possible early dementia  DNR -I have discussed code status with the patient andshewould not desire resuscitation and would prefer to die a natural death should that situation arise. -She will need a gold out of facility DNR formprovided by her PCP after further outpatient discussion.  Consultations:  Cardiology/EP  Procedures/Studies: None  Discharge Exam: BP (!) 170/72 (BP Location: Left Arm)   Pulse (!) 43   Temp 97.9 F (36.6 C) (Oral)   Resp 18   Ht 4\' 10"  (1.473 m)   Wt 52 kg   SpO2 97%   BMI 23.96 kg/m   Patient was examined earlier this AM and exam is unchanged at this time. Discharge Instructions Follow up: Please make an appointment to see your primary physician for follow up within 7 days of hospital discharge.  At that appointment:            -We routinely change or add medications that can affect your  baseline labs and fluid status; therefore, you may require repeat blood work or tests during your next visit with your PCP.  Your PCP may decide not to get them or may add new tests based on their clinical decision.             -Please get all medicines reviewed and adjusted.             -Please request that your primary physician go over all hospital tests and procedure/radiological results at the follow up.  Please  get all hospital records sent to your physician by signing a hospital release before you go home.   Lab Results  Component Value Date   TSH 5.752 (H) 01/14/2021     Patient Active Problem List   Diagnosis Date Noted  . Symptomatic bradycardia 01/14/2021  . Mild cognitive impairment 01/14/2021  . Pyrosis 09/20/2020  . Gastric ulcer without hemorrhage or perforation 09/20/2020  . History of choledocholithiasis 09/20/2020  . Abnormal CT scan, sigmoid colon 09/20/2020  . History of ERCP 09/20/2020  . Hyperkalemia 09/07/2020  . Hyperglycemia 09/04/2020  . Hypocalcemia 09/04/2020  . Acute cholecystitis 04/09/2020  . Varicose veins of both lower extremities 08/03/2019  . Hiatal hernia with GERD 03/16/2019  . Myalgia 09/25/2018  . Preventative health care 03/30/2018  . Hypothyroid   . Hyperlipidemia     Past Medical History:  Diagnosis Date  . Arthritis   . Hyperglycemia 09/04/2020  . Hyperlipidemia   . Hypothyroid     Past Surgical History:  Procedure Laterality Date  . BACK SURGERY    . BIOPSY  04/12/2020   Procedure: BIOPSY;  Surgeon: Rush Landmark Telford Nab., MD;  Location: [facility];  Service: Gastroenterology;;  . Lillard Anes Left   . CHOLECYSTECTOMY N/A 04/11/2020   Procedure: LAPAROSCOPIC CHOLECYSTECTOMY WITH INTRAOPERATIVE CHOLANGIOGRAM;  Surgeon: Alphonsa Overall, MD;  Location: [facility];  Service: General;  Laterality: N/A;  . ERCP N/A 04/12/2020   Procedure: ENDOSCOPIC RETROGRADE CHOLANGIOPANCREATOGRAPHY (ERCP);  Surgeon: Irving Copas., MD;  Location: [facility];  Service: Gastroenterology;  Laterality: N/A;  . FOOT SURGERY Right    bunion  . REMOVAL OF STONES  04/12/2020   Procedure: REMOVAL OF STONES;  Surgeon: Rush Landmark Telford Nab., MD;  Location: [facility];  Service: Gastroenterology;;  . [name]  04/12/2020   Procedure: [name];  Surgeon: Rush Landmark Telford Nab., MD;  Location: [facility];  Service: Gastroenterology;;     Social History   Tobacco Use  . Smoking status: Never Smoker  . Smokeless tobacco: Never Used  Vaping Use  . Vaping Use: Never used  Substance Use Topics  . Alcohol use: Yes    Comment: socially  . Drug use: Never    Family History  Problem Relation Age of Onset  . Cancer Mother   . Heart disease Father   . Arthritis Father        rheumatoid  . Cancer Brother        colon  . Colon cancer Brother   . Esophageal cancer Neg Hx   . Inflammatory bowel disease Neg Hx   . Liver disease Neg Hx   . Pancreatic cancer Neg Hx   . Stomach cancer Neg Hx     No Known Allergies  Medication list has been reviewed and updated.  Current Outpatient Medications on File Prior to Visit  Medication Sig Dispense Refill  . levothyroxine (SYNTHROID) 50 MCG tablet TAKE ONE TABLET BY MOUTH ONE TIME DAILY (Patient taking differently: Take 50 mcg by  mouth daily before breakfast.) 90 tablet 1  . omeprazole (PRILOSEC OTC) 20 MG tablet Take 20 mg by mouth daily as needed (acid reflux).     No current facility-administered medications on file prior to visit.    Review of Systems:  As per HPI- otherwise negative.   Physical Examination: There were no vitals filed for this visit. There were no vitals filed for this visit. There is no height or weight on file to calculate BMI. Ideal Body Weight:    GEN: no acute distress. HEENT: Atraumatic, Normocephalic.  Ears and Nose: No external deformity. CV: RRR, No M/G/R. No JVD. No thrill. No extra heart sounds. PULM: CTA B, no wheezes, crackles, rhonchi. No retractions. No resp. distress. No accessory muscle use. ABD: S, NT, ND, +BS. No rebound. No HSM. EXTR: No c/c/e PSYCH: Normally interactive. Conversant.    Assessment and Plan: *** This visit occurred during the SARS-CoV-2 public health emergency.  Safety protocols were in place, including screening questions prior to the visit, additional usage of staff PPE, and extensive cleaning of  exam room while observing appropriate contact time as indicated for disinfecting solutions.    Signed Lamar Blinks, MD

## 2021-01-22 ENCOUNTER — Ambulatory Visit: Payer: Medicare Other | Admitting: Family Medicine

## 2021-01-22 DIAGNOSIS — R001 Bradycardia, unspecified: Secondary | ICD-10-CM

## 2021-01-22 DIAGNOSIS — Z09 Encounter for follow-up examination after completed treatment for conditions other than malignant neoplasm: Secondary | ICD-10-CM

## 2021-01-22 DIAGNOSIS — Z8639 Personal history of other endocrine, nutritional and metabolic disease: Secondary | ICD-10-CM

## 2021-01-31 NOTE — Patient Instructions (Addendum)
Good to see you again today, I will be in touch with your labs asap   You might try some high quality running shoes-[employer] is a good brand which offers a lot of support and cushioning

## 2021-01-31 NOTE — Progress Notes (Addendum)
[place] [facility] [address] [place], [place] [address] [phone]  Date:  02/02/2021   Name:  Ferrin Liebig   DOB:  1930/02/07   MRN:  [id number]  PCP:  Mosie Lukes, MD    Chief Complaint: Discuss Pre-Diabetes, Weight Loss (Losing weight-not trying to. ), and lab work (Hyperkalemia/)   History of Present Illness:  Sheree Lalla is a 87 y.o. very pleasant female patient who presents with the following:  Periodic follow-up visit today Last seen by myself in June of 2021 She was recently admitted to the hospital briefly with bradycardia  Symptomatic bradycardia -This appears to be chronic but may have worsened after she took Kayexalate yesterday -HR appears to have settled in the 40-50s range -Consult to cardiology/EP -Patient was admitted to cardiac telemetry in anticipation of need for pacer -Pacer may need to be considered in the future, but EP reports that there is no indication for pacing at this time. -Patient should continue to avoid all potential AV nodal blocking agents Hyperkalemia -Consider Lokelma instead of Kayexalate to decrease vagal effects -K+ is currently 3.6 Hypothyroidism -Recent normal TSH -Continue Synthroid at current dose for now HLD -She does not appear to be taking medications for this issue at this time Mild cognitive impairment -Patient had noticeable memory impairment periodically during evaluation today -She reports having increasing "senior moments" -Encourage PCP evaluation for possible early dementia DNR -I have discussed code status with the patient andshewould not desire resuscitation and would prefer to die a natural death should that situation arise. -She will need a gold out of facility DNR formprovided by her PCP after further outpatient discussion.  Right now her pulse is ok, 70 BPM She does not feel as young as she used to, but overall she is doing well.  She does  have some foot pain especially with prolonged standing or walking.  We discussed supportive footwear options   Her 28 year old grandson died in a motor vehicle accident last year, this continues to be very difficult for her  She still drives and lives on her own  Her main concern today is to recheck her potassium, it had unfortunately trended back up after hospital discharge  She also notes that she is not as hungry as she has been previously, and is losing a bit of weight Her weight in May 2021 was 118 pounds Wt Readings from Last 3 Encounters:  02/02/21 112 lb (50.8 kg)  01/14/21 114 lb 10.2 oz (52 kg)  09/19/20 111 lb (50.3 kg)    Patient Active Problem List   Diagnosis Date Noted  . Symptomatic bradycardia 01/14/2021  . Mild cognitive impairment 01/14/2021  . Pyrosis 09/20/2020  . Gastric ulcer without hemorrhage or perforation 09/20/2020  . History of choledocholithiasis 09/20/2020  . Abnormal CT scan, sigmoid colon 09/20/2020  . History of ERCP 09/20/2020  . Hyperkalemia 09/07/2020  . Hyperglycemia 09/04/2020  . Hypocalcemia 09/04/2020  . Acute cholecystitis 04/09/2020  . Varicose veins of both lower extremities 08/03/2019  . Hiatal hernia with GERD 03/16/2019  . Myalgia 09/25/2018  . Preventative health care 03/30/2018  . Hypothyroid   . Hyperlipidemia     Past Medical History:  Diagnosis Date  . Arthritis   . Hyperglycemia 09/04/2020  . Hyperlipidemia   . Hypothyroid     Past Surgical History:  Procedure Laterality Date  . BACK SURGERY    . BIOPSY  04/12/2020   Procedure: BIOPSY;  Surgeon: Irving Copas., MD;  Location: [facility];  Service: Gastroenterology;;  . Lillard Anes Left   . CHOLECYSTECTOMY N/A 04/11/2020   Procedure: LAPAROSCOPIC CHOLECYSTECTOMY WITH INTRAOPERATIVE CHOLANGIOGRAM;  Surgeon: Alphonsa Overall, MD;  Location: [facility];  Service: General;  Laterality: N/A;  . ERCP N/A 04/12/2020   Procedure: ENDOSCOPIC RETROGRADE  CHOLANGIOPANCREATOGRAPHY (ERCP);  Surgeon: Irving Copas., MD;  Location: [facility];  Service: Gastroenterology;  Laterality: N/A;  . FOOT SURGERY Right    bunion  . REMOVAL OF STONES  04/12/2020   Procedure: REMOVAL OF STONES;  Surgeon: Rush Landmark Telford Nab., MD;  Location: [facility];  Service: Gastroenterology;;  . [name]  04/12/2020   Procedure: [name];  Surgeon: Rush Landmark Telford Nab., MD;  Location: [facility];  Service: Gastroenterology;;    Social History   Tobacco Use  . Smoking status: Never Smoker  . Smokeless tobacco: Never Used  Vaping Use  . Vaping Use: Never used  Substance Use Topics  . Alcohol use: Yes    Comment: socially  . Drug use: Never    Family History  Problem Relation Age of Onset  . Cancer Mother   . Heart disease Father   . Arthritis Father        rheumatoid  . Cancer Brother        colon  . Colon cancer Brother   . Esophageal cancer Neg Hx   . Inflammatory bowel disease Neg Hx   . Liver disease Neg Hx   . Pancreatic cancer Neg Hx   . Stomach cancer Neg Hx     No Known Allergies  Medication list has been reviewed and updated.  Current Outpatient Medications on File Prior to Visit  Medication Sig Dispense Refill  . levothyroxine (SYNTHROID) 50 MCG tablet TAKE ONE TABLET BY MOUTH ONE TIME DAILY (Patient taking differently: Take 50 mcg by mouth daily before breakfast.) 90 tablet 1  . omeprazole (PRILOSEC OTC) 20 MG tablet Take 20 mg by mouth daily as needed (acid reflux).     No current facility-administered medications on file prior to visit.    Review of Systems:  As per HPI- otherwise negative.   Physical Examination: Vitals:   02/02/21 1440  BP: 116/72  Pulse: 70  Resp: 17  Temp: 97.9 F (36.6 C)  SpO2: 99%   Vitals:   02/02/21 1440  Weight: 112 lb (50.8 kg)  Height: 4\' 10"  (1.473 m)   Body mass index is 23.41 kg/m. Ideal Body Weight: Weight in (lb) to have BMI = 25: 119.4  GEN: no  acute distress.  Looks well, normal weight HEENT: Atraumatic, Normocephalic.  Ears and Nose: No external deformity. CV: RRR, No M/G/R. No JVD. No thrill. No extra heart sounds. PULM: CTA B, no wheezes, crackles, rhonchi. No retractions. No resp. distress. No accessory muscle use. ABD: S, NT, ND, +BS. No rebound. No HSM. EXTR: No c/c/e PSYCH: Normally interactive. Conversant.  Corn on her left foot-otherwise foot exam is normal for age   Assessment and Plan: Hospital discharge follow-up  Hyperkalemia - Plan: Basic metabolic panel  Bradycardia  Hypothyroidism, unspecified type - Plan: TSH  Chronic foot pain, unspecified laterality  Following up today on recent hospital visit.  The patient was seen and briefly admitted with bradycardia, however was determined that she did not need a pacemaker so she was discharged home.  She does have cardiology follow-up later on this month.  I will check her potassium and thyroid today  Discussed her weight.  She plans  to try and eat more and see if she is able to regain pounds lost Encouraged supportive footwear, she might benefit from a trip to the shoe market to be professionally fitted Will plan further follow- up pending labs.  This visit occurred during the SARS-CoV-2 public health emergency.  Safety protocols were in place, including screening questions prior to the visit, additional usage of staff PPE, and extensive cleaning of exam room while observing appropriate contact time as indicated for disinfecting solutions.    Signed Lamar Blinks, MD  Received her labs 3/22- message to pt Results for orders placed or performed in visit on 67/20/94  Basic metabolic panel  Result Value Ref Range   Sodium 141 135 - 145 mEq/L   Potassium 4.6 3.5 - 5.1 mEq/L   Chloride 105 96 - 112 mEq/L   CO2 28 19 - 32 mEq/L   Glucose, Bld 98 70 - 99 mg/dL   BUN 20 6 - 23 mg/dL   Creatinine, Ser 0.90 0.40 - 1.20 mg/dL   GFR 56.87 (L) >60.00 mL/min    Calcium 9.7 8.4 - 10.5 mg/dL  TSH  Result Value Ref Range   TSH 1.69 0.35 - 4.50 uIU/mL

## 2021-02-02 ENCOUNTER — Encounter: Payer: Self-pay | Admitting: Family Medicine

## 2021-02-02 ENCOUNTER — Other Ambulatory Visit: Payer: Self-pay

## 2021-02-02 ENCOUNTER — Ambulatory Visit (INDEPENDENT_AMBULATORY_CARE_PROVIDER_SITE_OTHER): Payer: Medicare Other | Admitting: Family Medicine

## 2021-02-02 VITALS — BP 116/72 | HR 70 | Temp 97.9°F | Resp 17 | Ht <= 58 in | Wt 112.0 lb

## 2021-02-02 DIAGNOSIS — R001 Bradycardia, unspecified: Secondary | ICD-10-CM

## 2021-02-02 DIAGNOSIS — Z09 Encounter for follow-up examination after completed treatment for conditions other than malignant neoplasm: Secondary | ICD-10-CM

## 2021-02-02 DIAGNOSIS — E875 Hyperkalemia: Secondary | ICD-10-CM

## 2021-02-02 DIAGNOSIS — G8929 Other chronic pain: Secondary | ICD-10-CM

## 2021-02-02 DIAGNOSIS — E039 Hypothyroidism, unspecified: Secondary | ICD-10-CM | POA: Diagnosis not present

## 2021-02-02 DIAGNOSIS — M79673 Pain in unspecified foot: Secondary | ICD-10-CM | POA: Diagnosis not present

## 2021-02-03 ENCOUNTER — Encounter: Payer: Self-pay | Admitting: Family Medicine

## 2021-02-03 LAB — BASIC METABOLIC PANEL
BUN: 20 mg/dL (ref 6–23)
CO2: 28 mEq/L (ref 19–32)
Calcium: 9.7 mg/dL (ref 8.4–10.5)
Chloride: 105 mEq/L (ref 96–112)
Creatinine, Ser: 0.9 mg/dL (ref 0.40–1.20)
GFR: 56.87 mL/min — ABNORMAL LOW (ref >60.00)
Glucose, Bld: 98 mg/dL (ref 70–99)
Potassium: 4.6 mEq/L (ref 3.5–5.1)
Sodium: 141 mEq/L (ref 135–145)

## 2021-02-03 LAB — TSH: TSH: 1.69 u[IU]/mL (ref 0.35–4.50)

## 2021-02-04 ENCOUNTER — Telehealth: Payer: Self-pay | Admitting: Family Medicine

## 2021-02-04 DIAGNOSIS — R7303 Prediabetes: Secondary | ICD-10-CM

## 2021-02-04 NOTE — Telephone Encounter (Signed)
Sodium Poly styrene sulfonate  If she can take one table spoon for consitpation so she can have a BM.. she tried Miralax but it didnt work... She also wants to know if she's PreDiabetic.... Since her last lab drawing..  She also wants to know if she can take Prevagen to help with her memory.

## 2021-02-05 DIAGNOSIS — R7303 Prediabetes: Secondary | ICD-10-CM | POA: Insufficient documentation

## 2021-02-05 NOTE — Telephone Encounter (Signed)
Called pt back- let her know that she does have pre-diabetes, she was not sure.  Reassured her that this is nothing to be concerned about at this time, continue to monitor  Her last 3 K levels were ok- good news  She has a laxative that she bought OTC- mag citrate -  This is ok to use  She can take prevagen if she likes

## 2021-02-05 NOTE — Telephone Encounter (Signed)
I will call patient regrding labs. Looks like you sent her mychart message However, could you advise on medication question for constipation?

## 2021-02-08 NOTE — Progress Notes (Signed)
Cardiology Office Note:   Date:  02/11/2021  NAME:  Cynthia Young    MRN: [id number] DOB:  1930/05/28   PCP:  Mosie Lukes, MD  Cardiologist:  Evalina Field, MD  Electrophysiologist:  None   Referring MD: Mosie Lukes, MD   Chief Complaint  Patient presents with  . Bradycardia   History of Present Illness:   Cynthia Young is a 87 y.o. female with a hx of secondary AV block, Mobitz 1 Wenckebach), hyperlipidemia, GERD who presents for follow-up.  Seen in the emergency room earlier this month for dizziness lightheadedness.  Apparently had a vagal response to Keflex.  Evaluated by EP who felt she just had Wenckebach.  Follow-up with me was recommended.  No pacemaker was recommended. She apparently had a potassium of 5.8 her primary care physician office earlier this month.  She was given Kayexalate.  This gave her diarrhea.  She then got dizzy and lightheaded due to dehydration.  She was seen in urgent care and had a slow heart rate which she always does.  She was sent to the emergency room with need for possible pacemaker.  She was evaluated by EP.  EKG demonstrated winky block.  Her EKG today in office still demonstrates winky block.  Her atrial rate is 54.  Ventricular rate is 40.  She denies any significant lightheadedness or dizziness.  She reports her typical acid reflux symptoms.  No shortness of breath.  She has had no syncope.  She has been given conflicting information.  She is on a low potassium as well as a low acid diet.  She reports it is difficult.  Her potassium is normalized.  I informed her that she can liberalize her diet.  Unclear what caused her hyperkalemia.  Seems to be doing well.  She denies any major symptoms in office.  Problem List 1. HLD -T chol 220, HLD 59, LDL 137, TG 117 2.  Second Degree AV Block, Mobitz 1 (Wenckebach)/LAFB -Wenckebach 3. GERD 4. Hiatal hernia   Past Medical History: Past Medical History:  Diagnosis Date  . Arthritis   . Hyperglycemia  09/04/2020  . Hyperlipidemia   . Hypothyroid     Past Surgical History: Past Surgical History:  Procedure Laterality Date  . BACK SURGERY    . BIOPSY  04/12/2020   Procedure: BIOPSY;  Surgeon: Rush Landmark Telford Nab., MD;  Location: [facility];  Service: Gastroenterology;;  . Lillard Anes Left   . CHOLECYSTECTOMY N/A 04/11/2020   Procedure: LAPAROSCOPIC CHOLECYSTECTOMY WITH INTRAOPERATIVE CHOLANGIOGRAM;  Surgeon: Alphonsa Overall, MD;  Location: [facility];  Service: General;  Laterality: N/A;  . ERCP N/A 04/12/2020   Procedure: ENDOSCOPIC RETROGRADE CHOLANGIOPANCREATOGRAPHY (ERCP);  Surgeon: Irving Copas., MD;  Location: [facility];  Service: Gastroenterology;  Laterality: N/A;  . FOOT SURGERY Right    bunion  . REMOVAL OF STONES  04/12/2020   Procedure: REMOVAL OF STONES;  Surgeon: Rush Landmark Telford Nab., MD;  Location: [facility];  Service: Gastroenterology;;  . [name]  04/12/2020   Procedure: [name];  Surgeon: Mansouraty, Telford Nab., MD;  Location: [facility];  Service: Gastroenterology;;    Current Medications: Current Meds  Medication Sig  . levothyroxine (SYNTHROID) 50 MCG tablet TAKE ONE TABLET BY MOUTH ONE TIME DAILY (Patient taking differently: Take 50 mcg by mouth daily before breakfast.)  . omeprazole (PRILOSEC OTC) 20 MG tablet Take 20 mg by mouth daily as needed (acid reflux).     Allergies:    Patient has no known allergies.  Social History: Social History   Socioeconomic History  . Marital status: Widowed    Spouse name: Not on file  . Number of children: Not on file  . Years of education: Not on file  . Highest education level: Not on file  Occupational History  . Not on file  Tobacco Use  . Smoking status: Never Smoker  . Smokeless tobacco: Never Used  Vaping Use  . Vaping Use: Never used  Substance and Sexual Activity  . Alcohol use: Yes    Comment: socially  . Drug use: Never  . Sexual activity: Not Currently  Other  Topics Concern  . Not on file  Social History Narrative   worked in [employer], short time. No cigarettes or drug use. Live by self no dietary restictions and walks daily   Social Determinants of Health   Financial Resource Strain: Low Risk   . Difficulty of Paying Living Expenses: Not very hard  Food Insecurity: Not on file  Transportation Needs: Not on file  Physical Activity: Not on file  Stress: Not on file  Social Connections: Not on file     Family History: The patient's family history includes Arthritis in her father; Cancer in her brother and mother; Colon cancer in her brother; Heart disease in her father. There is no history of Esophageal cancer, Inflammatory bowel disease, Liver disease, Pancreatic cancer, or Stomach cancer.  ROS:   All other ROS reviewed and negative. Pertinent positives noted in the HPI.     EKGs/Labs/Other Studies Reviewed:   The following studies were personally reviewed by me today:  EKG:  EKG is ordered today.  The ekg ordered today demonstrates sinus rhythm heart 1 her heart atrial rates 54 atrial rate 54 bpm, ventricular rate 40 bpm, second-degree AV block type I (Wenckebach), and was personally reviewed by me.   TTE 05/23/2020  1. Mobitz II AVB was noted during the study  2. Left ventricular ejection fraction, by estimation, is 65 to 70%. The  left ventricle has normal function. The left ventricle has no regional  wall motion abnormalities. Left ventricular diastolic parameters are  consistent with Grade I diastolic  dysfunction (impaired relaxation).  3. Right ventricular systolic function is normal. The right ventricular  size is normal. There is normal pulmonary artery systolic pressure. The  estimated right ventricular systolic pressure is 26.3 mmHg.  4. The mitral valve is grossly normal. Trivial mitral valve  regurgitation.  5. The aortic valve is tricuspid. Aortic valve regurgitation is not  visualized.  6. The inferior vena  cava is normal in size with greater than 50%  respiratory variability, suggesting right atrial pressure of 3 mmHg.   Recent Labs: 01/14/2021: Hemoglobin 14.4; Magnesium 2.1; Platelets 266 01/16/2021: ALT 9 02/02/2021: BUN 20; Creatinine, Ser 0.90; Potassium 4.6; Sodium 141; TSH 1.69   Recent Lipid Panel    Component Value Date/Time   CHOL 194 01/12/2021 1009   TRIG 121.0 01/12/2021 1009   HDL 59.00 01/12/2021 1009   CHOLHDL 3 01/12/2021 1009   VLDL 24.2 01/12/2021 1009   LDLCALC 110 (H) 01/12/2021 1009   LDLCALC 128 (H) 09/04/2020 1125    Physical Exam:   VS:  BP (!) 122/58   Pulse (!) 40   Ht 4\' 11"  (1.499 m)   Wt 112 lb 3.2 oz (50.9 kg)   SpO2 97%   BMI 22.66 kg/m    Wt Readings from Last 3 Encounters:  02/11/21 112 lb 3.2 oz (50.9 kg)  02/02/21  112 lb (50.8 kg)  01/14/21 114 lb 10.2 oz (52 kg)    General: Well nourished, well developed, in no acute distress Head: Atraumatic, normal size  Eyes: PEERLA, EOMI  Neck: Supple, no JVD Endocrine: No thryomegaly Cardiac: Normal S1, S2; RRR; 2 out of 6 holosystolic murmur Lungs: Clear to auscultation bilaterally, no wheezing, rhonchi or rales  Abd: Soft, nontender, no hepatomegaly  Ext: No edema, pulses 2+ Musculoskeletal: No deformities, BUE and BLE strength normal and equal Skin: Warm and dry, no rashes   Neuro: Alert and oriented to person, place, time, and situation, CNII-XII grossly intact, no focal deficits  Psych: Normal mood and affect   ASSESSMENT:   Cynthia Young is a 87 y.o. female who presents for the following: 1. Mobitz type I Wenckebach atrioventricular block   2. Sinus bradycardia   3. LAFB (left anterior fascicular block)   4. Chest pain, unspecified type     PLAN:   1. Mobitz type I Wenckebach atrioventricular block 2. Sinus bradycardia 3. LAFB (left anterior fascicular block) -Recent emergency room visit for dizziness lightheadedness.  This was in the setting of taking Kayexalate for hyperkalemia.   She had diarrhea and was dehydrated.  This was the likely explanation for her dizziness.  Her telemetry demonstrated winky block.  EKG continues to demonstrate Wenckebach.  She reports no alarming symptoms.  She should avoid AV nodal agents.  There is no indication for pacemaker implantation at this time. -She and her daughter were counseled on red flag symptoms to look out for.  She will let us know if she has any dizziness, lightheadedness or syncope.  Overall she is doing well.  I have recommended regular exercise and proper diet.  I think it is okay for her to back off on the low potassium diet.  This appears to be causing her quite a significant amount of stress. -Recent thyroid studies are normal.  Recent echocardiogram shows normal LV function.  4. Chest pain, unspecified type -She has known acid reflux with a hiatal hernia.  No change in symptoms.  No exertional chest pain symptoms reported.  Disposition: Return in about 6 months (around 08/14/2021).  Medication Adjustments/Labs and Tests Ordered: Current medicines are reviewed at length with the patient today.  Concerns regarding medicines are outlined above.  Orders Placed This Encounter  Procedures  . EKG 12-Lead   No orders of the defined types were placed in this encounter.   Patient Instructions  Medication Instructions:  The current medical regimen is effective;  continue present plan and medications.  *If you need a refill on your cardiac medications before your next appointment, please call your pharmacy*   Follow-Up: At [facility], you and your health needs are our priority.  As part of our continuing mission to provide you with exceptional heart care, we have created designated Provider Care Teams.  These Care Teams include your primary Cardiologist (physician) and Advanced Practice Providers (APPs -  Physician Assistants and Nurse Practitioners) who all work together to provide you with the care you need, when you  need it.  We recommend signing up for the patient portal called "MyChart".  Sign up information is provided on this After Visit Summary.  MyChart is used to connect with patients for Virtual Visits (Telemedicine).  Patients are able to view lab/test results, encounter notes, upcoming appointments, etc.  Non-urgent messages can be sent to your provider as well.   To learn more about what you can do with MyChart, go  to [email or web address].    Your next appointment:   12 month(s)  The format for your next appointment:   In Person  Provider:   Eleonore Chiquito, MD        Time Spent with Patient: I have spent a total of 25 minutes with patient reviewing hospital notes, telemetry, EKGs, labs and examining the patient as well as establishing an assessment and plan that was discussed with the patient.  > 50% of time was spent in direct patient care.  Signed, Addison Naegeli. Audie Box, MD, [place]  [address] [place], [place] [address] [phone]  02/11/2021 1:30 PM

## 2021-02-11 ENCOUNTER — Ambulatory Visit (INDEPENDENT_AMBULATORY_CARE_PROVIDER_SITE_OTHER): Payer: Medicare Other | Admitting: Cardiovascular Disease

## 2021-02-11 ENCOUNTER — Other Ambulatory Visit: Payer: Self-pay

## 2021-02-11 ENCOUNTER — Encounter: Payer: Self-pay | Admitting: Cardiovascular Disease

## 2021-02-11 VITALS — BP 122/58 | HR 40 | Ht 59.0 in | Wt 112.2 lb

## 2021-02-11 DIAGNOSIS — R079 Chest pain, unspecified: Secondary | ICD-10-CM

## 2021-02-11 DIAGNOSIS — R001 Bradycardia, unspecified: Secondary | ICD-10-CM

## 2021-02-11 DIAGNOSIS — I444 Left anterior fascicular block: Secondary | ICD-10-CM

## 2021-02-11 DIAGNOSIS — I441 Atrioventricular block, second degree: Secondary | ICD-10-CM

## 2021-02-11 NOTE — Patient Instructions (Signed)

## 2021-02-16 DIAGNOSIS — D1801 Hemangioma of skin and subcutaneous tissue: Secondary | ICD-10-CM | POA: Diagnosis not present

## 2021-02-16 DIAGNOSIS — L820 Inflamed seborrheic keratosis: Secondary | ICD-10-CM | POA: Diagnosis not present

## 2021-02-23 ENCOUNTER — Ambulatory Visit: Payer: Medicare Other | Admitting: Cardiology

## 2021-03-12 ENCOUNTER — Other Ambulatory Visit: Payer: Self-pay | Admitting: Family Medicine

## 2021-04-30 ENCOUNTER — Ambulatory Visit (INDEPENDENT_AMBULATORY_CARE_PROVIDER_SITE_OTHER): Payer: Medicare Other | Admitting: Pharmacist

## 2021-04-30 DIAGNOSIS — M810 Age-related osteoporosis without current pathological fracture: Secondary | ICD-10-CM | POA: Diagnosis not present

## 2021-04-30 DIAGNOSIS — E875 Hyperkalemia: Secondary | ICD-10-CM

## 2021-04-30 DIAGNOSIS — E785 Hyperlipidemia, unspecified: Secondary | ICD-10-CM

## 2021-04-30 DIAGNOSIS — R7303 Prediabetes: Secondary | ICD-10-CM

## 2021-05-01 NOTE — Patient Instructions (Addendum)
Visit Information  PATIENT GOALS:  Goals Addressed             This Visit's Progress    Chronic Care Management Pharmacy Care Plan       CARE PLAN ENTRY (see longitudinal plan of care for additional care plan information)  Current Barriers:  Chronic Disease Management support, education, and care coordination needs related to Hypertension, Hyperlipidemia, Pre-Diabetes, Hypothyroidism, GERD   Hypertension Screening BP Readings from Last 3 Encounters:  02/11/21 (!) 122/58  02/02/21 116/72  01/14/21 (!) 170/72  Pharmacist Clinical Goal(s): Over the next 180 days, patient will work with PharmD and providers to maintain BP goal <140/90 while monitoring for hypotension. Currently not taking any blood pressure lowering medications. Current regimen:  Diet and exercise management   Interventions: Consider purchasing blood pressure cuff Check blood pressure 2-3 times per week and record Discussed BP goal Patient self care activities - Over the next 180 days, patient will: Maintain blood pressure less than 140/90  Hyperlipidemia Lab Results  Component Value Date/Time   LDLCALC 110 (H) 01/12/2021 10:09 AM   LDLCALC 128 (H) 09/04/2020 11:25 AM  Pharmacist Clinical Goal(s): Over the next 180 days, patient will work with PharmD and providers to achieve LDL goal < 100 or continue LDL trend downward Current regimen:  Diet and exercise management   Interventions: Discussed diet and exercise Reviewed last lipid panel form 01/12/2021 which showed that LDL was improving Patient self care activities - Over the next 180 days, patient will: Maintain low cholesterol / saturated fat diet  Pre-Diabetes Lab Results  Component Value Date/Time   HGBA1C 6.1 01/12/2021 10:09 AM   HGBA1C 5.9 (H) 09/04/2020 11:25 AM  Pharmacist Clinical Goal(s): Over the next 180 days, patient will work with PharmD and providers to maintain A1c goal <6.5% Current regimen:  Diet and exercise management    Interventions: Discussed diet and exercise Discussed A1c goals Patient self care activities - Over the next 180 days, patient will: Maintain a1c <6.5%  Vitamin D Deficiency / Osteoporosis:   VITD  Date Value Ref Range Status  01/12/2021 33.26 30.00 - 100.00 ng/mL Final    Pharmacist Clinical Goal(s) Over the next 90 days, patient will work with PharmD and providers to achieve vitamin D level within normal limits Current regimen:  None currently Interventions: Recommend vitamin D 2000 IU daily Goal is to get 1200mg  of calcium per day from combination of calcium supplement AND diet. Patient self care activities - Over the next 90 days, patient will: Restart vitamin D 2000 IU daily (can purchase over the counter) Start calcium supplement 600mg  daily and maintain current intake of calcium rich foods.   Medication management Pharmacist Clinical Goal(s): Over the next 180 days, patient will work with PharmD and providers to maintain optimal medication adherence Current pharmacy: [facility] Interventions Comprehensive medication review performed. Continue current medication management strategy Patient self care activities - Over the next 180 days, patient will: Focus on medication adherence by filling and taking medications appropriately  Take medications as prescribed Report any questions or concerns to PharmD and/or provider(s)  Please see past updates related to this goal by clicking on the "Past Updates" button in the selected goal           Patient verbalizes understanding of instructions provided today and agrees to view in [place].   Telephone follow up appointment with care management team member scheduled for: 6 months unless needed sooner  Cherre Robins, PharmD Clinical Pharmacist [facility]  [place] [phone]  Steps to Estimate Your Calcium Intake  The chart below will help you estimate the amount of calcium you get from food on a  typical day. You'll also see how much more calcium you need to get each day from other food sources or supplements.  Calcium Calculator How to Estimate Your Daily Calcium Intake Step 1: Estimate the number of servings you have on a typical day for each type of food. One serving is equal to approximately:  8 oz. or one cup of milk 6 oz. of yogurt 1 oz. or 1 cubic inch of cheese The amount of calcium in fortified foods and juices ranges from 80 - 1,000 mg. Some examples are juices, soymilk and cereals.  Step 2: List the estimated number of servings of each food item under "Servings Per Day."  Step 3: Multiply the number of "Servings Per Day" by the number of milligrams (mg) under "Calcium." For example: if you have about two servings of milk per day, multiply 2 x 300 to get a total of 600 mg of calcium from milk.  Step 4: After you have calculated the total amount of calcium for each product, add these totals in the right hand column to get your Total Daily Calcium Intake. Note: 250 mg of calcium is automatically added under "Estimated total from other foods." Most of [place] get about this amount of calcium each day from other foods like broccoli.  Step 5: Subtract your final total daily calcium intake from the recommended amount of calcium you need each day. This number is the additional calcium you need each day. You can get this additional calcium by adding calcium-rich foods to your diet and/or by taking a calcium supplement.  To learn how much total daily calcium is recommended for you, visit Calcium and Vitamin D: What You Need to Know.

## 2021-05-01 NOTE — Chronic Care Management (AMB) (Signed)
Chronic Care Management Pharmacy Note  05/01/2021 Name:  Cynthia Young MRN:  [id number] DOB:  03-09-1930  Summary: Recommended restarting calcium and vitamin D supplementation Patient requesting labs prior to restarting  Recommendations/Changes made from today's visit: Message sent to [facility] regarding follow up appt and labs.  Restart calcium 661m daily and vitamin D 2000 units daily.   Subjective: Cynthia Young an 87y.o. year old female who is a primary patient of BMosie Lukes MD.  The CCM team was consulted for assistance with disease management and care coordination needs.    Engaged with patient by telephone for follow up visit in response to provider referral for pharmacy case management and/or care coordination services.   Consent to Services:  The patient was given information about Chronic Care Management services, agreed to services, and gave verbal consent prior to initiation of services.  Please see initial visit note for detailed documentation.   Patient Care Team: BMosie Lukes MD as PCP - General (Family Medicine) O'Neal, WCassie Freer MD as PCP - Cardiology (Cardiology) ECherre Robins PharmD (Pharmacist)  Recent office visits: 02/02/2021 - PCP office (Dr CLorelei Pont hospital f/u; was admitted for symptomatic bradycardia; BMP and TSH checked. No med changes.   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 Monitor for dizziness or syncope;   Hospital visits: ED Visit 01/14/2021: Seen for symptomatic bradycardia - chronic but possibly worsened by diarrhea form Kayexalate. Avoid AV blocking agents; TSH normal; K+ was 3.6; No med changes.   Objective:  Lab Results  Component Value Date   CREATININE 0.90 02/02/2021   CREATININE 0.85 01/16/2021   CREATININE 0.82 01/14/2021    Lab Results  Component Value Date   HGBA1C  6.1 01/12/2021   Last diabetic Eye exam: No results found for: HMDIABEYEEXA  Last diabetic Foot exam: No results found for: HMDIABFOOTEX      Component Value Date/Time   CHOL 194 01/12/2021 1009   TRIG 121.0 01/12/2021 1009   HDL 59.00 01/12/2021 1009   CHOLHDL 3 01/12/2021 1009   VLDL 24.2 01/12/2021 1009   LDLCALC 110 (H) 01/12/2021 1009   LDLCALC 128 (H) 09/04/2020 1125    Hepatic Function Latest Ref Rng & Units 01/16/2021 01/12/2021 09/04/2020  Total Protein 6.0 - 8.3 g/dL 6.5 6.8 7.1  Albumin 3.5 - 5.2 g/dL 4.0 4.2 -  AST 0 - 37 U/L _0 ALT 0 - 35 U/L _1 Alk Phosphatase 39 - 117 U/L 63 66 -  Total Bilirubin 0.2 - 1.2 mg/dL 0.5 0.5 0.4    Lab Results  Component Value Date/Time   TSH 1.69 02/02/2021 03:03 PM   TSH 5.752 (H) 01/14/2021 02:30 AM   TSH 2.80 01/12/2021 10:09 AM    CBC Latest Ref Rng & Units 01/14/2021 01/12/2021 09/04/2020  WBC 4.0 - 10.5 K/uL 7.9 7.5 6.8  Hemoglobin 12.0 - 15.0 g/dL 14.4 14.3 14.3  Hematocrit 36.0 - 46.0 % 43.8 43.3 44.2  Platelets 150 - 400 K/uL 266 251.0 258    Lab Results  Component Value Date/Time   VD25OH 33.26 01/12/2021 10:09 AM   VD25OH 21 (L) 09/04/2020 11:25 AM    Clinical ASCVD: No  The ASCVD Risk score (GEnigma, et al., 2013) failed to calculate for the following reasons:   The 2013 ASCVD risk score is only valid for ages  40 to 79    Other:  DEXA 06/05/2018  T-Score = -3.2 at right hip of femur T-Score = -2.3 at spine   Social History   Tobacco Use  Smoking Status Never  Smokeless Tobacco Never   BP Readings from Last 3 Encounters:  02/11/21 (!) 122/58  02/02/21 116/72  01/14/21 (!) 170/72   Pulse Readings from Last 3 Encounters:  02/11/21 (!) 40  02/02/21 70  01/14/21 (!) 43   Wt Readings from Last 3 Encounters:  02/11/21 112 lb 3.2 oz (50.9 kg)  02/02/21 112 lb (50.8 kg)  01/14/21 114 lb 10.2 oz (52 kg)    Assessment: Review of patient past medical history, allergies, medications,  health status, including review of consultants reports, laboratory and other test data, was performed as part of comprehensive evaluation and provision of chronic care management services.   SDOH:  (Social Determinants of Health) assessments and interventions performed:  SDOH Interventions    Flowsheet Row Most Recent Value  SDOH Interventions   Financial Strain Interventions Intervention Not Indicated       CCM Care Plan  No Known Allergies  Medications Reviewed Today     Reviewed by Cherre Robins, PharmD (Pharmacist) on 04/30/21 at 72  Med List Status: <None>   Medication Order Taking? Sig Documenting Provider Last Dose Status Informant  levothyroxine (SYNTHROID) 50 MCG tablet [id number] Yes TAKE ONE TABLET BY MOUTH ONE TIME DAILY Mosie Lukes, MD Taking Active   omeprazole (PRILOSEC OTC) 20 MG tablet [id number] Yes Take 20 mg by mouth daily as needed (acid reflux). [provider] Taking Active Self            Patient Active Problem List   Diagnosis Date Noted   Prediabetes 02/05/2021   Symptomatic bradycardia 01/14/2021   Mild cognitive impairment 01/14/2021   Pyrosis 09/20/2020   Gastric ulcer without hemorrhage or perforation 09/20/2020   History of choledocholithiasis 09/20/2020   Abnormal CT scan, sigmoid colon 09/20/2020   History of ERCP 09/20/2020   Hyperkalemia 09/07/2020   Hyperglycemia 09/04/2020   Hypocalcemia 09/04/2020   Acute cholecystitis 04/09/2020   Varicose veins of both lower extremities 08/03/2019   Hiatal hernia with GERD 03/16/2019   Myalgia 09/25/2018   Preventative health care 03/30/2018   Hypothyroid    Hyperlipidemia     Immunization History  Administered Date(s) Administered   Fluad Quad(high Dose 65+) 09/04/2020   Moderna Sars-Covid-2 Vaccination 01/14/2020, 02/22/2020    Conditions to be addressed/monitored: HLD and pre DM; osteoporosis; hyperkalemia; vitamin D deficiancy  Care Plan : General Pharmacy (Adult)   Updates made by Cherre Robins, PHARMD since 05/01/2021 12:00 AM     Problem: Medication management and chronic care managment and education   Priority: High  Onset Date: 04/30/2021  Note:   Current Barriers:  Unable to achieve control of hyperlipidemia  Does not adhere to prescribed medication regimen Has declined pharmacotherapy for treatment of osteoporosis and stopped taking calcium and vitamin D supplementation Education needed regarding dietary instructions for multiple conditions (hyperkalemia, osteoporosis, hyperlipidemia, pre DM)  Pharmacist Clinical Goal(s):  Over the next 90 days, patient will achieve control of hyperlipidemia as evidenced by LDL <100 maintain control of hypothyroidism as evidenced by TSH in normal limits  Have labs rechecked  through collaboration with PharmD and provider.  Improve ability to adhere to dietary recommendations for multiple conditions through diet evaluation and educations.   Interventions: 1:1 collaboration with Mosie Lukes, MD regarding development and update of comprehensive plan  of care as evidenced by provider attestation and co-signature Inter-disciplinary care team collaboration (see longitudinal plan of care) Comprehensive medication review performed; medication list updated in electronic medical record  Pre - Diabetes (goal is to maintain A1c <6.5%)  Controlled Current treatment: diet and exercise  Current glucose readings: does not check BG at home Current diet:  Admits to being a "sweet lover" and eating ice cream frequently Is a little frustrated because she has several conditions she is trying to manage with diet changes and recommendations sometimes counter each other.  Current exercise: no regular exercise Interventions:  Discussed low sugar sweet alternatives like sugar free jello, popsicles or pudding Patient would like A1c rechecked - message sent to CMA for appointment Increase physical activity as able - start with  5 to 10 minute walks or stationary bike and work up as able  Hyperlipidemia: Uncontrolled but LDL has improved form 128 to 110; LDL goal ideally <100 but patient has no CVD and is 87yo Current treatment: diet and exercise  Medications previously tried: none  Current dietary patterns: She states she avoids fried foods Interventions Check lipids with next labs Limit intake of saturated fat and cholesterol Increase physical activity as able - start with 5 to 10 minute walks or stationary bike and work up as able  Osteoporosis: Patient has declined pharmacotherapy in past H/O compression fracture 05/2020 History of low vitamin D - 09/04/2020 was 21 but last checked 01/12/2021 and was 33 Eats ice cream regularly for calcium; greens once per week. Current treatment: Vitamin D (recommended but patient is not currently taking) Calcium supplement (recommended but patient is not currently taking) Last DEXA: 06/05/2018 T score -3.2 at neck of right hip T score -2.3 at spine Interventions:  Restart vitamin D 1000 IU daily  Recheck BMP and if calcium is WNL; restart calcium supplement 688m up to twice a day to get 12010mper day from diet and supplementation  Hyperkalemia: Has tried to limit high potassium foods in past but has gotten frustrated with different dietary recommendations for different chronic conditions Interventions:  Consider recheck potassium / BMP at next office visit.   Patient Goals/Self-Care Activities Over the next 90 days, patient will:  take medications as prescribed, engage in dietary modifications by limiting intake of saturated fat, sugar containing food and beverage, and Restart vitamin D and calcium supplementation  Follow Up Plan: Telephone follow up appointment with care management team member scheduled for:  3 months         Medication Assistance: None required.  Patient affirms current coverage meets needs.  Patient's preferred pharmacy is:  [facility]  [address] [place] [address] [id number]. [address] [place] [id number]: [phone]: [phone] Up:  Patient agrees to Care Plan and Follow-up.  Plan: Telephone follow up appointment with care management team member scheduled for:  6 months  TaCherre RobinsPharmD Clinical Pharmacist [phone]

## 2021-06-10 ENCOUNTER — Encounter: Payer: Self-pay | Admitting: Emergency Medicine

## 2021-06-10 ENCOUNTER — Emergency Department (INDEPENDENT_AMBULATORY_CARE_PROVIDER_SITE_OTHER)
Admission: EM | Admit: 2021-06-10 | Discharge: 2021-06-10 | Disposition: A | Discharge status: Home / Self Care | Payer: Medicare Other | Source: Home / Self Care | Attending: Family Medicine | Admitting: Family Medicine

## 2021-06-10 ENCOUNTER — Other Ambulatory Visit: Payer: Self-pay

## 2021-06-10 DIAGNOSIS — R42 Dizziness and giddiness: Secondary | ICD-10-CM

## 2021-06-10 DIAGNOSIS — R001 Bradycardia, unspecified: Secondary | ICD-10-CM

## 2021-06-10 NOTE — ED Triage Notes (Signed)
Patient states that when she awoke this morning, she was dizzy while standing.  Patient did have a headache when she went to the bed last night and this morning.  Patient denies any OTC meds for the HA.

## 2021-06-10 NOTE — ED Notes (Signed)
While EKG being done, pt's HR went into the high 30's. BP 143/78. Dr. Meda Coffee made aware.

## 2021-06-10 NOTE — Discharge Instructions (Addendum)
You need to drink more fluids that are noncaffeinated.  Consider decaffeinated tea 2 cups of coffee in the morning is still fine Avoid standing up quickly.  Dangle your feet over the edge of the bed or chair for a minute before you stand See your cardiologist in follow-up

## 2021-06-10 NOTE — ED Provider Notes (Signed)
Cynthia Young [facility]    CSN: [id number] Arrival date & time: 06/10/21  1337      History   Chief Complaint Chief Complaint  Patient presents with   Dizziness    HPI Cynthia Young is a 87 y.o. female.   HPI  Very pleasant 87 year old woman.  Brought in by her daughter for lightheadedness.  Patient states that she had a headache when she went to bed last night.  She has a headache today.  Its on the right parietal region.  She states that she had a concussion here over 10 years ago and still gets periodic headaches.  No light sensitivity or vision changes.  No history of migraines.  No trouble with balance or cognition.  This morning she woke up and was lightheaded upon standing.  She states that she had to sit back down so that she did not faint.  She is not having any vertigo or spinning, its more that she feels symptoms when standing from a seated or laying down position. Patient has known Mobitz 1 block.  She has known bradycardia.  She has been evaluated by cardiology and by the electrical conduction cardiologist.  It is thought she does not yet need a pacemaker.  Only medications are levothyroxine for thyroid replacement.  She also takes omeprazole for acid reflux.  Past Medical History:  Diagnosis Date   Arthritis    Hyperglycemia 09/04/2020   Hyperlipidemia    Hypothyroid     Patient Active Problem List   Diagnosis Date Noted   Prediabetes 02/05/2021   Symptomatic bradycardia 01/14/2021   Mild cognitive impairment 01/14/2021   Pyrosis 09/20/2020   Gastric ulcer without hemorrhage or perforation 09/20/2020   History of choledocholithiasis 09/20/2020   Abnormal CT scan, sigmoid colon 09/20/2020   History of ERCP 09/20/2020   Hyperkalemia 09/07/2020   Hyperglycemia 09/04/2020   Hypocalcemia 09/04/2020   Acute cholecystitis 04/09/2020   Varicose veins of both lower extremities 08/03/2019   Hiatal hernia with GERD 03/16/2019   Myalgia 09/25/2018   Preventative  health care 03/30/2018   Hypothyroid    Hyperlipidemia     Past Surgical History:  Procedure Laterality Date   BACK SURGERY     BIOPSY  04/12/2020   Procedure: BIOPSY;  Surgeon: Irving Copas., MD;  Location: [facility];  Service: Gastroenterology;;   Lillard Anes Left    CHOLECYSTECTOMY N/A 04/11/2020   Procedure: LAPAROSCOPIC CHOLECYSTECTOMY WITH INTRAOPERATIVE CHOLANGIOGRAM;  Surgeon: Alphonsa Overall, MD;  Location: [facility];  Service: General;  Laterality: N/A;   ERCP N/A 04/12/2020   Procedure: ENDOSCOPIC RETROGRADE CHOLANGIOPANCREATOGRAPHY (ERCP);  Surgeon: Irving Copas., MD;  Location: [facility];  Service: Gastroenterology;  Laterality: N/A;   FOOT SURGERY Right    bunion   REMOVAL OF STONES  04/12/2020   Procedure: REMOVAL OF STONES;  Surgeon: Rush Landmark Telford Nab., MD;  Location: [facility];  Service: Gastroenterology;;   [name]  04/12/2020   Procedure: [name];  Surgeon: Rush Landmark Telford Nab., MD;  Location: [facility];  Service: Gastroenterology;;    OB History     Gravida  3   Para  3   Term      Preterm      AB      Living         SAB      IAB      Ectopic      Multiple      Live Births  Home Medications    Prior to Admission medications   Medication Sig Start Date End Date Taking? Authorizing Provider  levothyroxine (SYNTHROID) 50 MCG tablet TAKE ONE TABLET BY MOUTH ONE TIME DAILY 03/12/21  Yes Mosie Lukes, MD  omeprazole (PRILOSEC OTC) 20 MG tablet Take 20 mg by mouth daily as needed (acid reflux).   Yes [provider]    Family History Family History  Problem Relation Age of Onset   Cancer Mother    Heart disease Father    Arthritis Father        rheumatoid   Cancer Brother        colon   Colon cancer Brother    Esophageal cancer Neg Hx    Inflammatory bowel disease Neg Hx    Liver disease Neg Hx    Pancreatic cancer Neg Hx    Stomach cancer Neg Hx     Social  History Social History   Tobacco Use   Smoking status: Never   Smokeless tobacco: Never  Vaping Use   Vaping Use: Never used  Substance Use Topics   Alcohol use: Yes    Comment: socially   Drug use: Never     Allergies   Patient has no known allergies.   Review of Systems Review of Systems See HPI  Physical Exam Triage Vital Signs ED Triage Vitals  Enc Vitals Group     BP 06/10/21 1413 (!) 146/62     Pulse Rate 06/10/21 1400 (!) 58     Resp --      Temp --      Temp src --      SpO2 06/10/21 1400 97 %     Weight --      Height --      Head Circumference --      Peak Flow --      Pain Score 06/10/21 1408 0     Pain Loc --      Pain Edu? --      Excl. in [place]? --    No data found.  Updated Vital Signs BP (!) 146/62 (BP Location: Right Arm)   Pulse (!) 58   SpO2 97%      Physical Exam Constitutional:      General: She is not in acute distress.    Appearance: She is well-developed and normal weight.     Comments: Clear sensorium.  Able to answer questions  HENT:     Head: Normocephalic and atraumatic.     Mouth/Throat:     Comments: Mask is in place Eyes:     Conjunctiva/sclera: Conjunctivae normal.     Pupils: Pupils are equal, round, and reactive to light.  Neck:     Vascular: No carotid bruit.  Cardiovascular:     Rate and Rhythm: Bradycardia present.     Heart sounds: Murmur heard.     Comments: Frequent ectopy Pulmonary:     Effort: Pulmonary effort is normal. No respiratory distress.     Breath sounds: No wheezing or rales.  Abdominal:     General: There is no distension.     Palpations: Abdomen is soft.  Musculoskeletal:        General: Normal range of motion.     Cervical back: Normal range of motion and neck supple.     Right lower leg: No edema.     Left lower leg: No edema.  Skin:    General: Skin is warm and dry.  Neurological:     General: No focal deficit present.     Mental Status: She is alert.     Gait: Gait normal.   Psychiatric:        Mood and Affect: Mood normal.        Behavior: Behavior normal.     UC Treatments / Results  Labs (all labs ordered are listed, but only abnormal results are displayed) Labs Reviewed - No data to display  EKG-EKG had heart rate of 38.  Same Mobitz 1 block.  With her symptomatology I did call Dr. [place] cardiology.  He reviewed her records and her EKG.  He feels that she is unchanged from her prior evaluation.  He recommends that she follow-up with the cardiologist of record, Dr. Allegra Lai.  This information is relayed to patient and daughter   Radiology No results found.  Procedures Procedures (including critical care time)  Medications Ordered in UC Medications - No data to display  Initial Impression / Assessment and Plan / UC Course  I have reviewed the triage vital signs and the nursing notes.  Pertinent labs & imaging results that were available during my care of the patient were reviewed by me and considered in my medical decision making (see chart for details).     Symptomatic bradycardia.  Discussed prevention, increase fluids, reduce caffeine (drinks coffee and tea all day and no water.)  Follow-up with cardiology Final Clinical Impressions(s) / UC Diagnoses   Final diagnoses:  Lightheadedness  Bradycardia on ECG     Discharge Instructions      You need to drink more fluids that are noncaffeinated.  Consider decaffeinated tea 2 cups of coffee in the morning is still fine Avoid standing up quickly.  Dangle your feet over the edge of the bed or chair for a minute before you stand See your cardiologist in follow-up   ED Prescriptions   None    PDMP not reviewed this encounter.   Raylene Everts, MD 06/10/21 [phone]

## 2021-06-11 NOTE — Progress Notes (Addendum)
[place] [facility] [address] [place], [place] [address] [phone]  Date:  06/12/2021   Name:  Cynthia Young   DOB:  Mar 17, 1930   MRN:  [id number]  PCP:  Mosie Lukes, MD    Chief Complaint: lab work  (To make sure her blood work is doing ) and Follow-up (From urgent care)   History of Present Illness:  Cynthia Young is a 87 y.o. very pleasant female patient who presents with the following:  Patient seen today for follow-up and blood work Primary patient of Dr. Charlett Blake Last visit with myself was in March of this year-at that time she had recently been admitted to the hospital with bradycardia, they were not immediately thinking about a pacemaker She has had problems with hyperkalemia in the past- She would like to check her K today -she notes that she has been eating some more higher potassium foods recently such as bananas and sweet potatoes  She was seen in the ER on on July 27 with concern of lightheadedness and headache.  She is not sure what caused the symptoms She was noted to be bradycardic with a rate of 38 and Mobitz 1 block-cardiology was consulted of the tongue, recommended that she follow-up with her cardiologist outpatient She does feel better since she was in the ER but still may occasionally feel lightheaded.  No fainting or presyncope  No blood work done in the ER Shingles vaccine-recommended COVID-19 booster-recommended Tetanus booster Pneumonia vaccine- she is really not sure of her pneumonia vaccine history, we will give a dose of prevnar 20 today  She also has been noted to have A1c in the prediabetes range, would like to check this today Patient Active Problem List   Diagnosis Date Noted   Prediabetes 02/05/2021   Symptomatic bradycardia 01/14/2021   Mild cognitive impairment 01/14/2021   Pyrosis 09/20/2020   Gastric ulcer without hemorrhage or perforation 09/20/2020   History of  choledocholithiasis 09/20/2020   Abnormal CT scan, sigmoid colon 09/20/2020   History of ERCP 09/20/2020   Hyperkalemia 09/07/2020   Hyperglycemia 09/04/2020   Hypocalcemia 09/04/2020   Acute cholecystitis 04/09/2020   Varicose veins of both lower extremities 08/03/2019   Hiatal hernia with GERD 03/16/2019   Myalgia 09/25/2018   Preventative health care 03/30/2018   Hypothyroid    Hyperlipidemia     Past Medical History:  Diagnosis Date   Arthritis    Hyperglycemia 09/04/2020   Hyperlipidemia    Hypothyroid     Past Surgical History:  Procedure Laterality Date   BACK SURGERY     BIOPSY  04/12/2020   Procedure: BIOPSY;  Surgeon: Irving Copas., MD;  Location: [facility];  Service: Gastroenterology;;   Lillard Anes Left    CHOLECYSTECTOMY N/A 04/11/2020   Procedure: LAPAROSCOPIC CHOLECYSTECTOMY WITH INTRAOPERATIVE CHOLANGIOGRAM;  Surgeon: Alphonsa Overall, MD;  Location: [facility];  Service: General;  Laterality: N/A;   ERCP N/A 04/12/2020   Procedure: ENDOSCOPIC RETROGRADE CHOLANGIOPANCREATOGRAPHY (ERCP);  Surgeon: Irving Copas., MD;  Location: [facility];  Service: Gastroenterology;  Laterality: N/A;   FOOT SURGERY Right    bunion   REMOVAL OF STONES  04/12/2020   Procedure: REMOVAL OF STONES;  Surgeon: Rush Landmark Telford Nab., MD;  Location: [facility];  Service: Gastroenterology;;   [name]  04/12/2020   Procedure: [name];  Surgeon: Rush Landmark Telford Nab., MD;  Location: [facility];  Service: Gastroenterology;;    Social History   Tobacco  Use   Smoking status: Never   Smokeless tobacco: Never  Vaping Use   Vaping Use: Never used  Substance Use Topics   Alcohol use: Yes    Comment: socially   Drug use: Never    Family History  Problem Relation Age of Onset   Cancer Mother    Heart disease Father    Arthritis Father        rheumatoid   Cancer Brother        colon   Colon cancer Brother    Esophageal cancer Neg Hx     Inflammatory bowel disease Neg Hx    Liver disease Neg Hx    Pancreatic cancer Neg Hx    Stomach cancer Neg Hx     No Known Allergies  Medication list has been reviewed and updated.  Current Outpatient Medications on File Prior to Visit  Medication Sig Dispense Refill   levothyroxine (SYNTHROID) 50 MCG tablet TAKE ONE TABLET BY MOUTH ONE TIME DAILY 90 tablet 1   omeprazole (PRILOSEC OTC) 20 MG tablet Take 20 mg by mouth daily as needed (acid reflux).     No current facility-administered medications on file prior to visit.    Review of Systems:  As per HPI- otherwise negative.   Physical Examination: Vitals:   06/12/21 1101  BP: 130/70  Pulse: 64  Temp: 97.8 F (36.6 C)  SpO2: 95%   Vitals:   06/12/21 1101  Weight: 112 lb 12.8 oz (51.2 kg)  Height: '4\' 10"'$  (1.473 m)   Body mass index is 23.58 kg/m. Ideal Body Weight: Weight in (lb) to have BMI = 25: 119.4  GEN: no acute distress.  Petite build, looks younger than age 55: Atraumatic, Normocephalic.  Ears and Nose: No external deformity. CV: RRR with occasional dropped beats. No M/G/R. No JVD. No thrill. No extra heart sounds. PULM: CTA B, no wheezes, crackles, rhonchi. No retractions. No resp. distress. No accessory muscle use. EXTR: No c/c/e PSYCH: Normally interactive. Conversant.    Assessment and Plan: Immunization due - Plan: Pneumococcal conjugate vaccine 20-valent (Prevnar 20)  Bradycardia  History of hyperkalemia - Plan: Basic metabolic panel  Pre-diabetes - Plan: Hemoglobin A1c Seen today for hospital follow-up Cynthia Young is feeling better, but she does consider to have some lightheadedness and fatigue.  I have reached out to her cardiologist to bring her in for a routine recheck  We will check potassium and A1c today  Given Prevnar 20  She does note symptoms of peripheral neuropathy.  However, she would like to delay further evaluation of this issue for now  This visit occurred during the  SARS-CoV-2 public health emergency.  Safety protocols were in place, including screening questions prior to the visit, additional usage of staff PPE, and extensive cleaning of exam room while observing appropriate contact time as indicated for disinfecting solutions.   Signed Lamar Blinks, MD  Received her labs as below, message to patient Results for orders placed or performed in visit on 0000000  Basic metabolic panel  Result Value Ref Range   Sodium 138 135 - 145 mEq/L   Potassium 4.7 3.5 - 5.1 mEq/L   Chloride 104 96 - 112 mEq/L   CO2 26 19 - 32 mEq/L   Glucose, Bld 95 70 - 99 mg/dL   BUN 20 6 - 23 mg/dL   Creatinine, Ser 0.88 0.40 - 1.20 mg/dL   GFR 58.28 (L) >60.00 mL/min   Calcium 9.7 8.4 - 10.5 mg/dL  Hemoglobin A1c  Result Value Ref Range   Hgb A1c MFr Bld 6.1 4.6 - 6.5 %

## 2021-06-12 ENCOUNTER — Ambulatory Visit (INDEPENDENT_AMBULATORY_CARE_PROVIDER_SITE_OTHER): Payer: Medicare Other | Admitting: Family Medicine

## 2021-06-12 ENCOUNTER — Other Ambulatory Visit: Payer: Self-pay

## 2021-06-12 ENCOUNTER — Encounter: Payer: Self-pay | Admitting: Family Medicine

## 2021-06-12 VITALS — BP 130/70 | HR 64 | Temp 97.8°F | Ht <= 58 in | Wt 112.8 lb

## 2021-06-12 DIAGNOSIS — Z23 Encounter for immunization: Secondary | ICD-10-CM

## 2021-06-12 DIAGNOSIS — Z8639 Personal history of other endocrine, nutritional and metabolic disease: Secondary | ICD-10-CM

## 2021-06-12 DIAGNOSIS — R7303 Prediabetes: Secondary | ICD-10-CM

## 2021-06-12 DIAGNOSIS — R001 Bradycardia, unspecified: Secondary | ICD-10-CM | POA: Diagnosis not present

## 2021-06-12 LAB — BASIC METABOLIC PANEL
BUN: 20 mg/dL (ref 6–23)
CO2: 26 mEq/L (ref 19–32)
Calcium: 9.7 mg/dL (ref 8.4–10.5)
Chloride: 104 mEq/L (ref 96–112)
Creatinine, Ser: 0.88 mg/dL (ref 0.40–1.20)
GFR: 58.28 mL/min — ABNORMAL LOW (ref >60.00)
Glucose, Bld: 95 mg/dL (ref 70–99)
Potassium: 4.7 mEq/L (ref 3.5–5.1)
Sodium: 138 mEq/L (ref 135–145)

## 2021-06-12 LAB — HEMOGLOBIN A1C: Hgb A1c MFr Bld: 6.1 % (ref 4.6–6.5)

## 2021-06-12 NOTE — Patient Instructions (Addendum)
It was good to see you again today- I will be in touch with your labs asap I will ask Dr Farris Has to get you in for a follow-up visit Let me know if you would like to talk to neurolgoy about your nerve pain  You got a pneumonia booster today If not done already I would also suggest the shingles vaccine- Shingrix- which can be given at your pharmacy  Also covid 19 booster if not done already

## 2021-06-17 NOTE — Progress Notes (Signed)
Cardiology Office Note:   Date:  06/18/2021  NAME:  Cynthia Young    MRN: [id number] DOB:  03/08/1930   PCP:  Mosie Lukes, MD  Cardiologist:  Evalina Field, MD   Referring MD: Mosie Lukes, MD   Chief Complaint  Patient presents with   Follow-up    History of Present Illness:   Cynthia Young is a 87 y.o. female with a hx of Second Degree AV Block, Mobtiz 1, GERD who presents for follow-up. Evaluated in ER 06/10/2021 for dizziness. EKG showed sinus bradycardia with 1AVB. TSH in March 1.69.  Recently seen in the emergency room.  She reports around noon she became dizzy upon standing.  She reports symptoms resolved with sitting down.  She just felt lightheaded.  She did not pass out.  She had no chest pain or shortness of breath.  She reports she may have had 1 glass of water that morning.  She had had very little to eat.  She also had coffee.  She was seen the emergency room and EKG showed sinus bradycardia with first AV block.  Evaluated by primary care and sent for evaluation here.  EKG today demonstrates heart rate 38 with sinus rhythm with second-degree AV block type I, Mobitz 1, Wenckebach.  She does have a left anterior fascicular block.  No widening of the QRS that is alarming.  She reports no further episodes.  She presents with her daughter.  She is clearly not drinking enough water.  She may drink 2 to 3 glasses/day.  She also drinks coffee.  I suspect she is dehydrated.  She is chronically suffers from dehydration.  No further chest pain or trouble breathing.  Symptoms appear to have resolved.  She also reports to me that she keeps her house very hot.  The daughter reports that sometimes AC is off and that temperature is in the mid 66s.  I think this could be dehydrating her as well.  I have recommended to keep the temperature a bit cooler as well as adequate hydration.  We will proceed with a 7-day Zio patch to exclude any dangerous heart bradycardia events.  However I feel this is  strictly dehydration.   Problem List 1. HLD -T chol 220, HLD 59, LDL 137, TG 117 2.  Second Degree AV Block, Mobitz 1 (Wenckebach)/LAFB -Wenckebach 3. GERD 4. Hiatal hernia   Past Medical History: Past Medical History:  Diagnosis Date   Arthritis    Hyperglycemia 09/04/2020   Hyperlipidemia    Hypothyroid     Past Surgical History: Past Surgical History:  Procedure Laterality Date   BACK SURGERY     BIOPSY  04/12/2020   Procedure: BIOPSY;  Surgeon: Rush Landmark Telford Nab., MD;  Location: [facility];  Service: Gastroenterology;;   Lillard Anes Left    CHOLECYSTECTOMY N/A 04/11/2020   Procedure: LAPAROSCOPIC CHOLECYSTECTOMY WITH INTRAOPERATIVE CHOLANGIOGRAM;  Surgeon: Alphonsa Overall, MD;  Location: [facility];  Service: General;  Laterality: N/A;   ERCP N/A 04/12/2020   Procedure: ENDOSCOPIC RETROGRADE CHOLANGIOPANCREATOGRAPHY (ERCP);  Surgeon: Irving Copas., MD;  Location: [facility];  Service: Gastroenterology;  Laterality: N/A;   FOOT SURGERY Right    bunion   REMOVAL OF STONES  04/12/2020   Procedure: REMOVAL OF STONES;  Surgeon: Rush Landmark Telford Nab., MD;  Location: [facility];  Service: Gastroenterology;;   [name]  04/12/2020   Procedure: [name];  Surgeon: Rush Landmark Telford Nab., MD;  Location: [facility];  Service: Gastroenterology;;    Current Medications: Current  Meds  Medication Sig   levothyroxine (SYNTHROID) 50 MCG tablet TAKE ONE TABLET BY MOUTH ONE TIME DAILY   omeprazole (PRILOSEC OTC) 20 MG tablet Take 20 mg by mouth daily as needed (acid reflux).     Allergies:    Patient has no known allergies.   Social History: Social History   Socioeconomic History   Marital status: Widowed    Spouse name: Not on file   Number of children: Not on file   Years of education: Not on file   Highest education level: Not on file  Occupational History   Not on file  Tobacco Use   Smoking status: Never   Smokeless tobacco: Never   Vaping Use   Vaping Use: Never used  Substance and Sexual Activity   Alcohol use: Yes    Comment: socially   Drug use: Never   Sexual activity: Not Currently  Other Topics Concern   Not on file  Social History Narrative   worked in [employer] office, short time. No cigarettes or drug use. Live by self no dietary restictions and walks daily   Social Determinants of Health   Financial Resource Strain: Low Risk    Difficulty of Paying Living Expenses: Not very hard  Food Insecurity: Not on file  Transportation Needs: Not on file  Physical Activity: Not on file  Stress: Not on file  Social Connections: Not on file     Family History: The patient's family history includes Arthritis in her father; Cancer in her brother and mother; Colon cancer in her brother; Heart disease in her father. There is no history of Esophageal cancer, Inflammatory bowel disease, Liver disease, Pancreatic cancer, or Stomach cancer.  ROS:   All other ROS reviewed and negative. Pertinent positives noted in the HPI.     EKGs/Labs/Other Studies Reviewed:   The following studies were personally reviewed by me today:  EKG:  EKG is ordered today.  The ekg ordered today demonstrates sinus rhythm heart rate 38, second-degree AV block type I, Wenckebach, and was personally reviewed by me.   TTE 05/23/2020  1. Mobitz II AVB was noted during the study   2. Left ventricular ejection fraction, by estimation, is 65 to 70%. The  left ventricle has normal function. The left ventricle has no regional  wall motion abnormalities. Left ventricular diastolic parameters are  consistent with Grade I diastolic  dysfunction (impaired relaxation).   3. Right ventricular systolic function is normal. The right ventricular  size is normal. There is normal pulmonary artery systolic pressure. The  estimated right ventricular systolic pressure is 0000000 mmHg.   4. The mitral valve is grossly normal. Trivial mitral valve  regurgitation.    5. The aortic valve is tricuspid. Aortic valve regurgitation is not  visualized.   6. The inferior vena cava is normal in size with greater than 50%  respiratory variability, suggesting right atrial pressure of 3 mmHg.   Recent Labs: 01/14/2021: Hemoglobin 14.4; Magnesium 2.1; Platelets 266 01/16/2021: ALT 9 02/02/2021: TSH 1.69 06/12/2021: BUN 20; Creatinine, Ser 0.88; Potassium 4.7; Sodium 138   Recent Lipid Panel    Component Value Date/Time   CHOL 194 01/12/2021 1009   TRIG 121.0 01/12/2021 1009   HDL 59.00 01/12/2021 1009   CHOLHDL 3 01/12/2021 1009   VLDL 24.2 01/12/2021 1009   LDLCALC 110 (H) 01/12/2021 1009   LDLCALC 128 (H) 09/04/2020 1125    Physical Exam:   VS:  BP 140/60 (BP Location: Right Arm, Patient Position:  Sitting, Cuff Size: Normal)   Pulse (!) 38   Ht '4\' 10"'$  (1.473 m)   Wt 112 lb 9.6 oz (51.1 kg)   SpO2 97%   BMI 23.53 kg/m    Wt Readings from Last 3 Encounters:  06/18/21 112 lb 9.6 oz (51.1 kg)  06/12/21 112 lb 12.8 oz (51.2 kg)  02/11/21 112 lb 3.2 oz (50.9 kg)    General: Well nourished, well developed, in no acute distress Head: Atraumatic, normal size  Eyes: PEERLA, EOMI  Neck: Supple, no JVD Endocrine: No thryomegaly Cardiac: Normal S1, S2; RRR; no murmurs, rubs, or gallops Lungs: Clear to auscultation bilaterally, no wheezing, rhonchi or rales  Abd: Soft, nontender, no hepatomegaly  Ext: No edema, pulses 2+ Musculoskeletal: No deformities, BUE and BLE strength normal and equal Skin: Warm and dry, no rashes   Neuro: Alert and oriented to person, place, time, and situation, CNII-XII grossly intact, no focal deficits  Psych: Normal mood and affect   ASSESSMENT:   Teliyah Alzate is a 87 y.o. female who presents for the following: 1. Mobitz type I Wenckebach atrioventricular block   2. Sinus bradycardia   3. LAFB (left anterior fascicular block)   4. Dizziness     PLAN:   1. Mobitz type I Wenckebach atrioventricular block 2. Sinus  bradycardia 3. LAFB (left anterior fascicular block) 4. Dizziness -She had an episode last week on 06/10/2021.  This occurred midday.  It occurred upon standing.  This was associated with poor oral hydration.  She is not drinking enough water.  EKG in the emergency room demonstrated sinus bradycardia with first AV block.  Her EKG today demonstrates heart rate 38 with second-degree AV block type I Mobitz 1.  This is Wenckebach.  She has had this for years.  Her symptoms have resolved.  I think this is all dehydration but to exclude any bradycardia events we will proceed with a 7-day Zio patch.  Her TSH has been checked recently and is normal.  There are no indications for pacing at this time.  She should avoid any AV nodal agents clearly.  I think she should also adjust the temperature in her house.  Apparently it is fairly hot.  This will dehydrate her more.  She was counseled extensively about adequate hydration.  We will see her back in 6 months.  Again no indication for pacing at this time.  This will not help her as her bradycardia that has been captured up-to-date is benign.  Disposition: Return in about 6 months (around 12/19/2021).  Medication Adjustments/Labs and Tests Ordered: Current medicines are reviewed at length with the patient today.  Concerns regarding medicines are outlined above.  Orders Placed This Encounter  Procedures   LONG TERM MONITOR (3-14 DAYS)   EKG 12-Lead    No orders of the defined types were placed in this encounter.   Patient Instructions   Testing/Procedures:  Bryn Gulling- Long Term Monitor Instructions  Your physician has requested you wear a ZIO patch monitor for 7 days.  This is a single patch monitor. [employer] supplies one patch monitor per enrollment. Additional stickers are not available. Please do not apply patch if you will be having a Nuclear Stress Test,  Echocardiogram, Cardiac CT, MRI, or Chest Xray during the period you would be wearing the  monitor.  The patch cannot be worn during these tests. You cannot remove and re-apply the  ZIO XT patch monitor.  Your ZIO patch monitor will be mailed 3 day  USPS to your address on file. It may take 3-5 days  to receive your monitor after you have been enrolled.  Once you have received your monitor, please review the enclosed instructions. Your monitor  has already been registered assigning a specific monitor serial # to you.  Billing and Patient Assistance Program Information  We have supplied [employer] with any of your insurance information on file for billing purposes. [employer] offers a sliding scale Patient Assistance Program for patients that do not have  insurance, or whose insurance does not completely cover the cost of the ZIO monitor.  You must apply for the Patient Assistance Program to qualify for this discounted rate.  To apply, please call [employer] at [phone], select option 4, select option 2, ask to apply for  Patient Assistance Program. Theodore Demark will ask your household income, and how many people  are in your household. They will quote your out-of-pocket cost based on that information.  [employer] will also be able to set up a 99-month interest-free payment plan if needed.  Applying the monitor   Shave hair from upper left chest.  Hold abrader disc by orange tab. Rub abrader in 40 strokes over the upper left chest as  indicated in your monitor instructions.  Clean area with 4 enclosed alcohol pads. Let dry.  Apply patch as indicated in monitor instructions. Patch will be placed under collarbone on left  side of chest with arrow pointing upward.  Rub patch adhesive wings for 2 minutes. Remove white label marked "1". Remove the white  label marked "2". Rub patch adhesive wings for 2 additional minutes.  While looking in a mirror, press and release button in center of patch. A small green light will  flash 3-4 times. This will be your only indicator that the monitor has been turned on.   Do not shower for the first 24 hours. You may shower after the first 24 hours.  Press the button if you feel a symptom. You will hear a small click. Record Date, Time and  Symptom in the Patient Logbook.  When you are ready to remove the patch, follow instructions on the last 2 pages of Patient  Logbook. Stick patch monitor onto the last page of Patient Logbook.  Place Patient Logbook in the blue and white box. Use locking tab on box and tape box closed  securely. The blue and white box has prepaid postage on it. Please place it in the mailbox as  soon as possible. Your physician should have your test results approximately 7 days after the  monitor has been [facility]  Call IDallasat [phone] you have questions regarding  your ZIO XT patch monitor. Call them immediately if you see an orange light blinking on your  monitor.  If your monitor falls off in less than 4 days, contact our Monitor department at [phone]  If your monitor becomes loose or falls off after 4 days call [employer] at [phone]  suggestions on securing your monitor    Follow-Up: [facility] your [facility] needs are our [facility].  As part of our continuing mission to provide you with exceptional heart care, we have created designated Provider Care Teams.  These Care Teams include your primary Cardiologist (physician) and Advanced Practice Providers (APPs -  Physician Assistants and Nurse Practitioners) who all work together to provide you with the care you need, when you need it.  We recommend signing up for the  patient portal called "MyChart".  Sign up information is provided on this After Visit Summary.  MyChart is used to connect with patients for Virtual Visits (Telemedicine).  Patients are able to view lab/test results, encounter notes, upcoming appointments, etc.  Non-urgent messages can be sent to your provider as well.   To learn more about what  you can do with MyChart, go to [email or web address].    Your next appointment:   6 month(s)  The format for your next appointment:   In Person  Provider:   Eleonore Chiquito, MD    Time Spent with Patient: I have spent a total of 35 minutes with patient reviewing hospital notes, telemetry, EKGs, labs and examining the patient as well as establishing an assessment and plan that was discussed with the patient.  > 50% of time was spent in direct patient care.  Signed, Addison Naegeli. Audie Box, MD, [place]  [address], [place], [place] [address] [phone]  06/18/2021 11:22 AM

## 2021-06-18 ENCOUNTER — Ambulatory Visit (INDEPENDENT_AMBULATORY_CARE_PROVIDER_SITE_OTHER): Payer: Medicare Other | Admitting: Cardiovascular Disease

## 2021-06-18 ENCOUNTER — Encounter: Payer: Self-pay | Admitting: Cardiovascular Disease

## 2021-06-18 ENCOUNTER — Ambulatory Visit (INDEPENDENT_AMBULATORY_CARE_PROVIDER_SITE_OTHER): Payer: Medicare Other

## 2021-06-18 ENCOUNTER — Other Ambulatory Visit: Payer: Self-pay

## 2021-06-18 VITALS — BP 140/60 | HR 38 | Ht <= 58 in | Wt 112.6 lb

## 2021-06-18 DIAGNOSIS — R001 Bradycardia, unspecified: Secondary | ICD-10-CM | POA: Diagnosis not present

## 2021-06-18 DIAGNOSIS — I441 Atrioventricular block, second degree: Secondary | ICD-10-CM

## 2021-06-18 DIAGNOSIS — R42 Dizziness and giddiness: Secondary | ICD-10-CM | POA: Diagnosis not present

## 2021-06-18 DIAGNOSIS — I444 Left anterior fascicular block: Secondary | ICD-10-CM

## 2021-06-18 NOTE — Progress Notes (Unsigned)
Enrolled patient for a 7 day Zio XT monitor to be mailed to patients home.  

## 2021-06-18 NOTE — Patient Instructions (Signed)
Testing/Procedures:  Cynthia Young- Long Term Monitor Instructions  Your physician has requested you wear a ZIO patch monitor for 7 days.  This is a single patch monitor. [employer] supplies one patch monitor per enrollment. Additional stickers are not available. Please do not apply patch if you will be having a Nuclear Stress Test,  Echocardiogram, Cardiac CT, MRI, or Chest Xray during the period you would be wearing the  monitor. The patch cannot be worn during these tests. You cannot remove and re-apply the  ZIO XT patch monitor.  Your ZIO patch monitor will be mailed 3 day USPS to your address on file. It may take 3-5 days  to receive your monitor after you have been enrolled.  Once you have received your monitor, please review the enclosed instructions. Your monitor  has already been registered assigning a specific monitor serial # to you.  Billing and Patient Assistance Program Information  We have supplied [employer] with any of your insurance information on file for billing purposes. [employer] offers a sliding scale Patient Assistance Program for patients that do not have  insurance, or whose insurance does not completely cover the cost of the ZIO monitor.  You must apply for the Patient Assistance Program to qualify for this discounted rate.  To apply, please call [employer] at [phone], select option 4, select option 2, ask to apply for  Patient Assistance Program. Theodore Demark will ask your household income, and how many people  are in your household. They will quote your out-of-pocket cost based on that information.  [employer] will also be able to set up a 66-month interest-free payment plan if needed.  Applying the monitor   Shave hair from upper left chest.  Hold abrader disc by orange tab. Rub abrader in 40 strokes over the upper left chest as  indicated in your monitor instructions.  Clean area with 4 enclosed alcohol pads. Let dry.  Apply patch as indicated in monitor instructions.  Patch will be placed under collarbone on left  side of chest with arrow pointing upward.  Rub patch adhesive wings for 2 minutes. Remove white label marked "1". Remove the white  label marked "2". Rub patch adhesive wings for 2 additional minutes.  While looking in a mirror, press and release button in center of patch. A small green light will  flash 3-4 times. This will be your only indicator that the monitor has been turned on.  Do not shower for the first 24 hours. You may shower after the first 24 hours.  Press the button if you feel a symptom. You will hear a small click. Record Date, Time and  Symptom in the Patient Logbook.  When you are ready to remove the patch, follow instructions on the last 2 pages of Patient  Logbook. Stick patch monitor onto the last page of Patient Logbook.  Place Patient Logbook in the blue and white box. Use locking tab on box and tape box closed  securely. The blue and white box has prepaid postage on it. Please place it in the mailbox as  soon as possible. Your physician should have your test results approximately 7 days after the  [facility]  Call [facility] [phone] you have questions regarding  your ZIO XT patch monitor. Call them immediately if you see an orange light blinking on your  monitor.  If your monitor falls off in less than 4 days, contact our Monitor department at [phone]  If your monitor becomes loose or falls off after 4 days call [employer] at [phone] for  suggestions on securing your monitor    Follow-Up: At [facility], you and your health needs are our priority.  As part of our continuing mission to provide you with exceptional heart care, we have created designated Provider Care Teams.  These Care Teams include your primary Cardiologist (physician) and Advanced Practice Providers (APPs -  Physician Assistants and Nurse Practitioners) who all work together to  provide you with the care you need, when you need it.  We recommend signing up for the patient portal called "MyChart".  Sign up information is provided on this After Visit Summary.  MyChart is used to connect with patients for Virtual Visits (Telemedicine).  Patients are able to view lab/test results, encounter notes, upcoming appointments, etc.  Non-urgent messages can be sent to your provider as well.   To learn more about what you can do with MyChart, go to [email or web address].    Your next appointment:   6 month(s)  The format for your next appointment:   In Person  Provider:   Eleonore Chiquito, MD

## 2021-06-22 ENCOUNTER — Telehealth: Payer: Self-pay | Admitting: Cardiovascular Disease

## 2021-06-22 NOTE — Telephone Encounter (Signed)
    Pt needs help to put in the heart monitor

## 2021-06-24 NOTE — Telephone Encounter (Signed)
PT is following up on heart monitor please advise

## 2021-06-24 NOTE — Telephone Encounter (Signed)
Unable to reach patient today. Will try again tom. Patient can come in tom 8/11 to have monitor applied I have available times after 1130

## 2021-06-25 DIAGNOSIS — R001 Bradycardia, unspecified: Secondary | ICD-10-CM | POA: Diagnosis not present

## 2021-06-25 NOTE — Telephone Encounter (Signed)
Spoke to patient she is going to talk to her daughter and see if she can apply it. If not she know she can call be back and come this afternoon or Monday to have it applied

## 2021-06-26 ENCOUNTER — Telehealth: Payer: Self-pay | Admitting: *Deleted

## 2021-06-26 NOTE — Telephone Encounter (Signed)
Patient called about lab results from 7/29, she was unable to get them on her mychart (she uses periodically, her daughters helps her).  Results given and appt made to follow up with Charlett Blake on 12/17/21 at 10am.

## 2021-06-29 ENCOUNTER — Telehealth: Payer: Self-pay | Admitting: Cardiovascular Disease

## 2021-06-29 NOTE — Telephone Encounter (Signed)
Patient called in to see if she can do the life line screening. Please advise

## 2021-06-30 NOTE — Telephone Encounter (Signed)
Return call to pt. She state the program screens for carotid artery disease, abnormal heart rhythm, AAA, and PAD. She said it only $ 85 and but received a brochure  for it wanted to get Dr. Debbe Mounts approval.

## 2021-07-01 NOTE — Telephone Encounter (Signed)
Pt updated with MD's recommendations and verbalized understanding.  Geralynn Rile, MD  You; Caprice Beaver, LPN 17 hours ago ([id number] PM)   At her age I would recommend against it. It will only cause her stress. She really has no symptoms to be concerned about.

## 2021-07-02 ENCOUNTER — Telehealth: Payer: Self-pay | Admitting: Cardiovascular Disease

## 2021-07-02 NOTE — Telephone Encounter (Signed)
  Pt c/o of Chest Pain: STAT if CP now or developed within 24 hours  1. Are you having CP right now? yes  2. Are you experiencing any other symptoms (ex. SOB, nausea, vomiting, sweating)? Feels warm, has been lightheaded  3. How long have you been experiencing CP? Started this morning  4. Is your CP continuous or coming and going? continuous  5. Have you taken Nitroglycerin? No   Patient states she has been having a pressure in her chest since this morning. She states it feels like she has a tight bra on, but she took it off and the feeling is still there. She states she is also concerned for her BP. She states she has been feeling lightheaded since she was last seen at the office, so her neighbor who is a nurse took her BP and the top number was 173. She states she has no way of taking her BP again.

## 2021-07-02 NOTE — Telephone Encounter (Signed)
Spoke to patient. She states she woke up with  chest discomfort  this morning. Describes in center of chest. Patient rates '@6'$  out of 10 . She states no nausea, no radiation , diaphoresis.   She mainly concerned  blood pressure was elevated on Tuesday of this week. '@173'$ /80  patient was not sure. She states shdoes not have blood pressure cuff at home.   (2)Daughters are at work at present time.  She states has a headache but it is a daily thing.  She states she was breathing erratic last evening and did not get to sleep until 3 am this morning. Feeling lightheaded this week .   Patient is not taking any medication except  levothyroxine and as  needed Pepcid .  Patient states she has not used Pepcid today .   RN  informed patient she will need an evaluation to see if she was having a heart attack - labs , EKG. Recommend  GOING ER,OR SHE can have EMS TO COME EVALUATE. Patient states she will try Pepcid, an wait until daughters are able to get  blood pressure cuff so she monitor blood pressure. Take something for her headache  She has completed wearing Zio and will mailing back today or tomorrow. RN agreed with patient ,but informed patient the information will not give all information need for symptoms she is having now. Recommend for patient to have it evaluated. Patient states she will  wait to se how she is feeling then make decision

## 2021-07-03 NOTE — Progress Notes (Deleted)
[place] at [facility] [address], [place], [place] [address] [id number] [phone]  Date:  07/06/2021   Name:  Cynthia Young   DOB:  May 27, 1930   MRN:  [id number]  PCP:  Mosie Lukes, MD    Chief Complaint: No chief complaint on file.   History of Present Illness:  Cynthia Young is a 87 y.o. very pleasant female patient who presents with the following:  Pt seen today for concern of feeling lightheaded Last visit with myself was in July  She has history of hypokalemia but most recent K wnl   Last cardiology visit was 8/4- 2nd degree AV block: 1. Mobitz type I Wenckebach atrioventricular block 2. Sinus bradycardia 3. LAFB (left anterior fascicular block) 4. Dizziness -She had an episode last week on 06/10/2021.  This occurred midday.  It occurred upon standing.  This was associated with poor oral hydration.  She is not drinking enough water.  EKG in the emergency room demonstrated sinus bradycardia with first AV block.  Her EKG today demonstrates heart rate 38 with second-degree AV block type I Mobitz 1.  This is Wenckebach.  She has had this for years.  Her symptoms have resolved.  I think this is all dehydration but to exclude any bradycardia events we will proceed with a 7-day Zio patch.  Her TSH has been checked recently and is normal.  There are no indications for pacing at this time.  She should avoid any AV nodal agents clearly.  I think she should also adjust the temperature in her house.  Apparently it is fairly hot.  This will dehydrate her more.  She was counseled extensively about adequate hydration.  We will see her back in 6 months.  Again no indication for pacing at this time.  This will not help her as her bradycardia that has been captured up-to-date is benign.   Disposition: Return in about 6 months  Patient Active Problem List   Diagnosis Date Noted   Prediabetes 02/05/2021   Symptomatic bradycardia 01/14/2021   Mild  cognitive impairment 01/14/2021   Pyrosis 09/20/2020   Gastric ulcer without hemorrhage or perforation 09/20/2020   History of choledocholithiasis 09/20/2020   Abnormal CT scan, sigmoid colon 09/20/2020   History of ERCP 09/20/2020   Hyperkalemia 09/07/2020   Hyperglycemia 09/04/2020   Hypocalcemia 09/04/2020   Acute cholecystitis 04/09/2020   Varicose veins of both lower extremities 08/03/2019   Hiatal hernia with GERD 03/16/2019   Myalgia 09/25/2018   Preventative health care 03/30/2018   Hypothyroid    Hyperlipidemia     Past Medical History:  Diagnosis Date   Arthritis    Hyperglycemia 09/04/2020   Hyperlipidemia    Hypothyroid     Past Surgical History:  Procedure Laterality Date   BACK SURGERY     BIOPSY  04/12/2020   Procedure: BIOPSY;  Surgeon: Irving Copas., MD;  Location: [facility];  Service: Gastroenterology;;   Lillard Anes Left    CHOLECYSTECTOMY N/A 04/11/2020   Procedure: LAPAROSCOPIC CHOLECYSTECTOMY WITH INTRAOPERATIVE CHOLANGIOGRAM;  Surgeon: Alphonsa Overall, MD;  Location: [facility];  Service: General;  Laterality: N/A;   ERCP N/A 04/12/2020   Procedure: ENDOSCOPIC RETROGRADE CHOLANGIOPANCREATOGRAPHY (ERCP);  Surgeon: Irving Copas., MD;  Location: [facility];  Service: Gastroenterology;  Laterality: N/A;   FOOT SURGERY Right    bunion   REMOVAL OF STONES  04/12/2020   Procedure: REMOVAL OF STONES;  Surgeon: Rush Landmark Telford Nab., MD;  Location: [facility];  Service: Gastroenterology;;   [name]  04/12/2020   Procedure: [name];  Surgeon: Mansouraty, Telford Nab., MD;  Location: [facility];  Service: Gastroenterology;;    Social History   Tobacco Use   Smoking status: Never   Smokeless tobacco: Never  Vaping Use   Vaping Use: Never used  Substance Use Topics   Alcohol use: Yes    Comment: socially   Drug use: Never    Family History  Problem Relation Age of Onset   Cancer Mother    Heart disease Father     Arthritis Father        rheumatoid   Cancer Brother        colon   Colon cancer Brother    Esophageal cancer Neg Hx    Inflammatory bowel disease Neg Hx    Liver disease Neg Hx    Pancreatic cancer Neg Hx    Stomach cancer Neg Hx     No Known Allergies  Medication list has been reviewed and updated.  Current Outpatient Medications on File Prior to Visit  Medication Sig Dispense Refill   levothyroxine (SYNTHROID) 50 MCG tablet TAKE ONE TABLET BY MOUTH ONE TIME DAILY 90 tablet 1   omeprazole (PRILOSEC OTC) 20 MG tablet Take 20 mg by mouth daily as needed (acid reflux).     No current facility-administered medications on file prior to visit.    Review of Systems:  ***  Physical Examination: There were no vitals filed for this visit. There were no vitals filed for this visit. There is no height or weight on file to calculate BMI. Ideal Body Weight:    ***  Assessment and Plan: ***  Signed Lamar Blinks, MD

## 2021-07-06 ENCOUNTER — Ambulatory Visit: Payer: Medicare Other | Admitting: Family Medicine

## 2021-07-07 ENCOUNTER — Telehealth: Payer: Self-pay | Admitting: Physician Assistant

## 2021-07-07 DIAGNOSIS — R001 Bradycardia, unspecified: Secondary | ICD-10-CM | POA: Diagnosis not present

## 2021-07-07 NOTE — Telephone Encounter (Signed)
Call received from [employer] with zio patch in place:  8/11-8/18 wore a monitor which showed 3 episodes of symptomatic bradycardia with mobitz II  8/11 at 10:11 pm - 32 bpm for 30 sec on page 22 strip 5 8/13 at 4:22 am - 29 bpm for 30 sec. Page 8 strip 8 8/15 at 8:39 am - 31 bpm for 30 sec pg 23 strip 9   Report is posted. I will send this message to Dr. Audie Box. Report faxed.   Ledora Bottcher, PA-C 07/07/2021, 6:38 PM [place] [address] [place], [place] [address]

## 2021-07-07 NOTE — Progress Notes (Signed)
[place] at [facility] [address] [place], [place] [address] [id number] [phone]  Date:  07/08/2021   Name:  Cynthia Young   DOB:  02/26/1930   MRN:  [id number]  PCP:  Mosie Lukes, MD    Chief Complaint: Dizziness (Possible migraine? )   History of Present Illness:  Cynthia Young is a 87 y.o. very pleasant female patient who presents with the following:  Patient is seen today for feeling lightheaded She is under cardiology care, was seen in cardiology clinic on August 4 by Dr Audie Box  She notes that today she is feeling pretty well Often at night she may start to have a headache.  She may also sometimes feel like she has to take a deep breath or like there is a flutter in her chest  Sometimes it will keep her up -admits that she gets quite nervous about this and might be afraid to go to sleep We discussed that she has known history of Mobitz 1 arrhythmia, which is generally benign  She did try melatonin once but was afraid to try it again  Here today with her daughter who assists with the history   Cynthia Young has noted some difficulty with hearing from her right ear, wonders if she might have earwax that needs to be removed  She has been somewhat concerned about prediabetes.  We discussed this today, I reassured her that her A1c is no cause for alarm and she can eat as she pleases within reason  BP Readings from Last 3 Encounters:  07/08/21 116/70  06/18/21 140/60  06/12/21 130/70    Patient Active Problem List   Diagnosis Date Noted   Prediabetes 02/05/2021   Symptomatic bradycardia 01/14/2021   Mild cognitive impairment 01/14/2021   Pyrosis 09/20/2020   Gastric ulcer without hemorrhage or perforation 09/20/2020   History of choledocholithiasis 09/20/2020   Abnormal CT scan, sigmoid colon 09/20/2020   History of ERCP 09/20/2020   Hyperkalemia 09/07/2020   Hyperglycemia 09/04/2020   Hypocalcemia 09/04/2020   Acute  cholecystitis 04/09/2020   Varicose veins of both lower extremities 08/03/2019   Hiatal hernia with GERD 03/16/2019   Myalgia 09/25/2018   Preventative health care 03/30/2018   Hypothyroid    Hyperlipidemia     Past Medical History:  Diagnosis Date   Arthritis    Hyperglycemia 09/04/2020   Hyperlipidemia    Hypothyroid     Past Surgical History:  Procedure Laterality Date   BACK SURGERY     BIOPSY  04/12/2020   Procedure: BIOPSY;  Surgeon: Irving Copas., MD;  Location: [facility];  Service: Gastroenterology;;   Lillard Anes Left    CHOLECYSTECTOMY N/A 04/11/2020   Procedure: LAPAROSCOPIC CHOLECYSTECTOMY WITH INTRAOPERATIVE CHOLANGIOGRAM;  Surgeon: Alphonsa Overall, MD;  Location: [facility];  Service: General;  Laterality: N/A;   ERCP N/A 04/12/2020   Procedure: ENDOSCOPIC RETROGRADE CHOLANGIOPANCREATOGRAPHY (ERCP);  Surgeon: Irving Copas., MD;  Location: [facility];  Service: Gastroenterology;  Laterality: N/A;   FOOT SURGERY Right    bunion   REMOVAL OF STONES  04/12/2020   Procedure: REMOVAL OF STONES;  Surgeon: Rush Landmark Telford Nab., MD;  Location: [facility];  Service: Gastroenterology;;   [name]  04/12/2020   Procedure: [name];  Surgeon: Mansouraty, Telford Nab., MD;  Location: [facility];  Service: Gastroenterology;;    Social History   Tobacco Use   Smoking status: Never   Smokeless tobacco: Never  Vaping Use   Vaping  Use: Never used  Substance Use Topics   Alcohol use: Yes    Comment: socially   Drug use: Never    Family History  Problem Relation Age of Onset   Cancer Mother    Heart disease Father    Arthritis Father        rheumatoid   Cancer Brother        colon   Colon cancer Brother    Esophageal cancer Neg Hx    Inflammatory bowel disease Neg Hx    Liver disease Neg Hx    Pancreatic cancer Neg Hx    Stomach cancer Neg Hx     No Known Allergies  Medication list has been reviewed and updated.  Current  Outpatient Medications on File Prior to Visit  Medication Sig Dispense Refill   levothyroxine (SYNTHROID) 50 MCG tablet TAKE ONE TABLET BY MOUTH ONE TIME DAILY 90 tablet 1   No current facility-administered medications on file prior to visit.    Review of Systems:  As per HPI- otherwise negative.   Physical Examination: Vitals:   07/08/21 1342  BP: 116/70  Pulse: 80  Resp: 16  Temp: (!) 96.9 F (36.1 C)  SpO2: 98%   Vitals:   07/08/21 1342  Weight: 111 lb (50.3 kg)  Height: '4\' 10"'$  (1.473 m)   Body mass index is 23.2 kg/m. Ideal Body Weight: Weight in (lb) to have BMI = 25: 119.4  GEN: no acute distress.  Normal weight, looks well HEENT: Atraumatic, Normocephalic.  Ears and Nose: No external deformity. CV: Bradycardia to approximately 50 bpm, some dropped beats, No M/G/R. No JVD. No thrill. No extra heart sounds. PULM: CTA B, no wheezes, crackles, rhonchi. No retractions. No resp. distress. No accessory muscle use. EXTR: No c/c/e PSYCH: Normally interactive. Conversant.  Ear wax in right ear only  She has noted some difficulty with hearing and would like to have her earwax removed.  Earwax was removed by irrigation today, patient tolerated well  Assessment and Plan: Symptomatic bradycardia  Bradycardia  Pre-diabetes  Impacted cerumen of right ear  Earwax impaction resolved with irrigation Discussed prediabetes.  Reassured patient that she is okay to eat as she pleases.  Her blood sugars are not a big concern at her age We discussed her bradycardia and palpitations.  I advised her that she is likely feeling occasional skipped beats due to her known Mobitz 1 AV block This is not dangerous and not a cause for alarm   Discussed strategies for hydration including adding flavoring to water  Assuming all is well, asked her to follow-up in 6 months  This visit occurred during the SARS-CoV-2 public health emergency.  Safety protocols were in place, including screening  questions prior to the visit, additional usage of staff PPE, and extensive cleaning of exam room while observing appropriate contact time as indicated for disinfecting solutions.   Signed Lamar Blinks, MD

## 2021-07-08 ENCOUNTER — Ambulatory Visit (INDEPENDENT_AMBULATORY_CARE_PROVIDER_SITE_OTHER): Payer: Medicare Other | Admitting: Family Medicine

## 2021-07-08 ENCOUNTER — Other Ambulatory Visit: Payer: Self-pay

## 2021-07-08 VITALS — BP 116/70 | HR 80 | Temp 96.9°F | Resp 16 | Ht <= 58 in | Wt 111.0 lb

## 2021-07-08 DIAGNOSIS — R7303 Prediabetes: Secondary | ICD-10-CM | POA: Diagnosis not present

## 2021-07-08 DIAGNOSIS — H6121 Impacted cerumen, right ear: Secondary | ICD-10-CM

## 2021-07-08 DIAGNOSIS — I441 Atrioventricular block, second degree: Secondary | ICD-10-CM

## 2021-07-08 DIAGNOSIS — R001 Bradycardia, unspecified: Secondary | ICD-10-CM | POA: Diagnosis not present

## 2021-07-08 NOTE — Patient Instructions (Signed)
Good to see you again today!  Take care and let me know if you need anything  Assuming all is well please see me in about 6 months

## 2021-07-14 ENCOUNTER — Telehealth: Payer: Self-pay | Admitting: Cardiovascular Disease

## 2021-07-14 NOTE — Telephone Encounter (Signed)
Patient called in wanting to speak with the Dr. Marisue Ivan in regards to the pacemaker. Patient dont see the need to get the pacemaker if not going to give her a longer life span. Patient wants to cancel the appt on 8th but want to speak with the dr  first.  Please advise

## 2021-07-14 NOTE — Telephone Encounter (Signed)
I contacted patient and she states that she wants to cancel her appointment with [facility]- she spoke with her children and they all agreed, and she does not want to go through having a pacemaker at 87 years old. I did try to go over her recent episodes with her and to at least go to discuss this with Dr.Camnitz, but she would like to make Dr.O'Neal aware as well. I did advise her not to cancel anything yet, but patient states she just wanted to call to let us know.   Will route to MD as FYI. Thanks!

## 2021-07-16 NOTE — Telephone Encounter (Signed)
I called and spoke to patient in regards to this.  Patient will keep appointment with Dr.Camnitz to discuss further.

## 2021-07-21 ENCOUNTER — Ambulatory Visit: Payer: Medicare Other | Admitting: Cardiology

## 2021-07-23 ENCOUNTER — Ambulatory Visit: Payer: Medicare Other | Admitting: Cardiology

## 2021-08-04 ENCOUNTER — Encounter: Payer: Self-pay | Admitting: Cardiology

## 2021-08-04 ENCOUNTER — Ambulatory Visit (INDEPENDENT_AMBULATORY_CARE_PROVIDER_SITE_OTHER): Payer: Medicare Other | Admitting: Cardiology

## 2021-08-04 ENCOUNTER — Other Ambulatory Visit: Payer: Self-pay

## 2021-08-04 VITALS — BP 92/62 | HR 35 | Ht 59.0 in | Wt 112.0 lb

## 2021-08-04 DIAGNOSIS — I441 Atrioventricular block, second degree: Secondary | ICD-10-CM | POA: Diagnosis not present

## 2021-08-04 NOTE — Patient Instructions (Signed)
Medication Instructions:  Your physician recommends that you continue on your current medications as directed. Please refer to the Current Medication list given to you today.  *If you need a refill on your cardiac medications before your next appointment, please call your pharmacy*   Lab Work: None ordered   Testing/Procedures: None ordered   Follow-Up: At [facility], you and your health needs are our priority.  As part of our continuing mission to provide you with exceptional heart care, we have created designated Provider Care Teams.  These Care Teams include your primary Cardiologist (physician) and Advanced Practice Providers (APPs -  Physician Assistants and Nurse Practitioners) who all work together to provide you with the care you need, when you need it.   Your next appointment:   6 month(s)  The format for your next appointment:   In Person  Provider:   Allegra Lai, MD in [facility]    Thank you for choosing [facility]!!   Trinidad Curet, RN [phone]

## 2021-08-04 NOTE — Progress Notes (Signed)
Electrophysiology Office Note   Date:  08/04/2021   ID:  Cynthia Young, DOB 1930-10-13, MRN [id number]  PCP:  Mosie Lukes, MD  Cardiologist:  [facility] Primary Electrophysiologist:  Blayden Conwell Meredith Leeds, MD    Chief Complaint: bradycardia   History of Present Illness: Cynthia Young is a 87 y.o. female who is being seen today for the evaluation of bradycardia at the request of Mosie Lukes, MD. Presenting today for electrophysiology evaluation.  She has a history of hiatal hernia, hyperlipidemia, venous insufficiency, Mobitz 1 AV block, hypothyroidism.  She was admitted to the hospital March 2022 with slow heart rates.  After hospitalization, she continued to have episodes of dizziness and lightheadedness.  She wore a cardiac monitor that showed episodes of 2-1 AV block as well as Mobitz 1 AV block.  Today, she denies symptoms of palpitations, chest pain, shortness of breath, orthopnea, PND, lower extremity edema, claudication, dizziness, presyncope, syncope, bleeding, or neurologic sequela. The patient is tolerating medications without difficulties.  Currently she feels well.  She notes some fatigue and dizziness, though she does state that she can get up and still driving.  She is able to do all her daily activities and is not restricted by any fatigue or shortness of breath.  She does have episodes of dizziness.  At times there when she stands, but there are also when she is at rest, not changing position.     Past Medical History:  Diagnosis Date   Arthritis    Hyperglycemia 09/04/2020   Hyperlipidemia    Hypothyroid    Past Surgical History:  Procedure Laterality Date   BACK SURGERY     BIOPSY  04/12/2020   Procedure: BIOPSY;  Surgeon: Rush Landmark Telford Nab., MD;  Location: [facility];  Service: Gastroenterology;;   Lillard Anes Left    CHOLECYSTECTOMY N/A 04/11/2020   Procedure: LAPAROSCOPIC CHOLECYSTECTOMY WITH INTRAOPERATIVE CHOLANGIOGRAM;  Surgeon: Alphonsa Overall, MD;   Location: [facility];  Service: General;  Laterality: N/A;   ERCP N/A 04/12/2020   Procedure: ENDOSCOPIC RETROGRADE CHOLANGIOPANCREATOGRAPHY (ERCP);  Surgeon: Irving Copas., MD;  Location: [facility];  Service: Gastroenterology;  Laterality: N/A;   FOOT SURGERY Right    bunion   REMOVAL OF STONES  04/12/2020   Procedure: REMOVAL OF STONES;  Surgeon: Rush Landmark Telford Nab., MD;  Location: [facility];  Service: Gastroenterology;;   [name]  04/12/2020   Procedure: [name];  Surgeon: Rush Landmark Telford Nab., MD;  Location: [facility];  Service: Gastroenterology;;     Current Outpatient Medications  Medication Sig Dispense Refill   levothyroxine (SYNTHROID) 50 MCG tablet TAKE ONE TABLET BY MOUTH ONE TIME DAILY 90 tablet 1   No current facility-administered medications for this visit.    Allergies:   Patient has no known allergies.   Social History:  The patient  reports that she has never smoked. She has never used smokeless tobacco. She reports current alcohol use. She reports that she does not use drugs.   Family History:  The patient's family history includes Arthritis in her father; Cancer in her brother and mother; Colon cancer in her brother; Heart disease in her father.    ROS:  Please see the history of present illness.   Otherwise, review of systems is positive for none.   All other systems are reviewed and negative.    PHYSICAL EXAM: VS:  BP 92/62   Pulse (!) 35   Ht 4\' 11"  (1.499 m)   Wt 112 lb (50.8 kg)   SpO2  95%   BMI 22.62 kg/m  , BMI Body mass index is 22.62 kg/m. GEN: Well nourished, well developed, in no acute distress  HEENT: normal  Neck: no JVD, carotid bruits, or masses Cardiac: RRR; no murmurs, rubs, or gallops,no edema  Respiratory:  clear to auscultation bilaterally, normal work of breathing GI: soft, nontender, nondistended, + BS MS: no deformity or atrophy  Skin: warm and dry Neuro:  Strength and sensation are intact Psych:  euthymic mood, full affect  EKG:  EKG is ordered today. Personal review of the ekg ordered shows sinus bradycardia, Mobitz 1 AV block  Recent Labs: 01/14/2021: Hemoglobin 14.4; Magnesium 2.1; Platelets 266 01/16/2021: ALT 9 02/02/2021: TSH 1.69 06/12/2021: BUN 20; Creatinine, Ser 0.88; Potassium 4.7; Sodium 138    Lipid Panel     Component Value Date/Time   CHOL 194 01/12/2021 1009   TRIG 121.0 01/12/2021 1009   HDL 59.00 01/12/2021 1009   CHOLHDL 3 01/12/2021 1009   VLDL 24.2 01/12/2021 1009   LDLCALC 110 (H) 01/12/2021 1009   LDLCALC 128 (H) 09/04/2020 1125     Wt Readings from Last 3 Encounters:  08/04/21 112 lb (50.8 kg)  07/08/21 111 lb (50.3 kg)  06/18/21 112 lb 9.6 oz (51.1 kg)      Other studies Reviewed: Additional studies/ records that were reviewed today include: Cardiac monitor 07/09/2021 personally reviewed Review of the above records today demonstrates:  1. 2 episodes of 2:1 block were present as described above.  2. Second degree AV block, Mobitz 1 was present and symptoms were reported with this.  3. No arrhythmias detected.   TTE 05/23/2020  1. Mobitz II AVB was noted during the study   2. Left ventricular ejection fraction, by estimation, is 65 to 70%. The  left ventricle has normal function. The left ventricle has no regional  wall motion abnormalities. Left ventricular diastolic parameters are  consistent with Grade I diastolic  dysfunction (impaired relaxation).   3. Right ventricular systolic function is normal. The right ventricular  size is normal. There is normal pulmonary artery systolic pressure. The  estimated right ventricular systolic pressure is 63.3 mmHg.   4. The mitral valve is grossly normal. Trivial mitral valve  regurgitation.   5. The aortic valve is tricuspid. Aortic valve regurgitation is not  visualized.   6. The inferior vena cava is normal in size with greater than 50%  respiratory variability, suggesting right atrial pressure of  3 mmHg.  ASSESSMENT AND PLAN:  1.  Second-degree AV block: Has a longstanding history of Mobitz 1 AV block.  Has had a few episodes of 2 1 block.  Despite her 2-1 AV block, her QRS is narrow, block is likely in the AV node.  She does get dizzy at times.  Her dizziness may be due to bradycardia, though this also occurs when she is at rest.  I am not convinced that her dizziness that she had bradycardic currently and is feeling well.  She is able to do all her daily activities, we Dresean Beckel hold off on pacemaker implant.  I have told both her and her daughter that if they change her mind, we Sofia Vanmeter see her again for likely pacemaker.  Case discussed with primary cardiology   Chana Lindstrom Curt Bears, MD 08/04/2021 9:22 AM     Current medicines are reviewed at length with the patient today.   The patient does not have concerns regarding her medicines.  The following changes were made today:  none  Labs/  tests ordered today include:  Orders Placed This Encounter  Procedures   EKG 12-Lead     Disposition:   FU with Darilyn Storbeck 6 months  Signed, Ezekial Arns Meredith Leeds, MD  08/04/2021 9:22 AM     [facility] [address] [place] [address] [phone] (office) [phone] (fax)

## 2021-08-05 ENCOUNTER — Ambulatory Visit (INDEPENDENT_AMBULATORY_CARE_PROVIDER_SITE_OTHER): Payer: Medicare Other

## 2021-08-05 ENCOUNTER — Telehealth: Payer: Self-pay | Admitting: Cardiovascular Disease

## 2021-08-05 VITALS — Ht 59.0 in | Wt 112.0 lb

## 2021-08-05 DIAGNOSIS — Z0000 Encounter for general adult medical examination without abnormal findings: Secondary | ICD-10-CM | POA: Diagnosis not present

## 2021-08-05 NOTE — Telephone Encounter (Signed)
Pt is wanting to know if she could take a magnesium supplement for her slow heartbeat.. she'd like to know if Dr. Marisue Ivan thinks this would be helpful

## 2021-08-05 NOTE — Telephone Encounter (Signed)
Called patient, advised of message from MD.  Patient verbalized understanding.  Thankful for call back.

## 2021-08-05 NOTE — Progress Notes (Signed)
Subjective:   Cynthia Young is a 87 y.o. female who presents for an Initial Medicare Annual Wellness Visit.  I connected with Cynthia Young today by telephone and verified that I am speaking with the correct person using two identifiers. Location patient: home Location provider: work Persons participating in the virtual visit: patient, Marine scientist.    I discussed the limitations, risks, security and privacy concerns of performing an evaluation and management service by telephone and the availability of in person appointments. I also discussed with the patient that there may be a patient responsible charge related to this service. The patient expressed understanding and verbally consented to this telephonic visit.    Interactive audio and video telecommunications were attempted between this provider and patient, however failed, due to patient having technical difficulties OR patient did not have access to video capability.  We continued and completed visit with audio only.  Some vital signs may be absent or patient reported.   Time Spent with patient on telephone encounter: 40 minutes   Review of Systems     Cardiac Risk Factors include: advanced age (>35men, >59 women);dyslipidemia;sedentary lifestyle     Objective:    Today's Vitals   08/05/21 0900  Weight: 112 lb (50.8 kg)  Height: 4\' 11"  (1.499 m)   Body mass index is 22.62 kg/m.  Advanced Directives 08/05/2021 01/14/2021 07/18/2020 07/12/2020 06/09/2020 04/09/2020 04/09/2020  Does Patient Have a Medical Advance Directive? No No No Yes Yes Yes Yes  Type of Advance Directive - - Living will;Healthcare Power of [place];Living will - [place];Living will [place]  Does patient want to make changes to medical advance directive? - - - - No - Patient declined No - Patient declined -  Copy of [place] in Chart? - - - - - No - copy requested -  Would patient like  information on creating a medical advance directive? - - - - - - -    Current Medications (verified) Outpatient Encounter Medications as of 08/05/2021  Medication Sig   levothyroxine (SYNTHROID) 50 MCG tablet TAKE ONE TABLET BY MOUTH ONE TIME DAILY   No facility-administered encounter medications on file as of 08/05/2021.    Allergies (verified) Patient has no known allergies.   History: Past Medical History:  Diagnosis Date   Arthritis    Hyperglycemia 09/04/2020   Hyperlipidemia    Hypothyroid    Past Surgical History:  Procedure Laterality Date   BACK SURGERY     BIOPSY  04/12/2020   Procedure: BIOPSY;  Surgeon: Rush Landmark Telford Nab., MD;  Location: [facility];  Service: Gastroenterology;;   Lillard Anes Left    CHOLECYSTECTOMY N/A 04/11/2020   Procedure: LAPAROSCOPIC CHOLECYSTECTOMY WITH INTRAOPERATIVE CHOLANGIOGRAM;  Surgeon: Alphonsa Overall, MD;  Location: [facility];  Service: General;  Laterality: N/A;   ERCP N/A 04/12/2020   Procedure: ENDOSCOPIC RETROGRADE CHOLANGIOPANCREATOGRAPHY (ERCP);  Surgeon: Irving Copas., MD;  Location: [facility];  Service: Gastroenterology;  Laterality: N/A;   FOOT SURGERY Right    bunion   REMOVAL OF STONES  04/12/2020   Procedure: REMOVAL OF STONES;  Surgeon: Rush Landmark Telford Nab., MD;  Location: [facility];  Service: Gastroenterology;;   [name]  04/12/2020   Procedure: [name];  Surgeon: Mansouraty, Telford Nab., MD;  Location: [facility];  Service: Gastroenterology;;   Family History  Problem Relation Age of Onset   Cancer Mother    Heart disease Father    Arthritis Father  rheumatoid   Cancer Brother        colon   Colon cancer Brother    Esophageal cancer Neg Hx    Inflammatory bowel disease Neg Hx    Liver disease Neg Hx    Pancreatic cancer Neg Hx    Stomach cancer Neg Hx    Social History   Socioeconomic History   Marital status: Widowed    Spouse name: Not on file   Number of  children: Not on file   Years of education: Not on file   Highest education level: Not on file  Occupational History   Not on file  Tobacco Use   Smoking status: Never   Smokeless tobacco: Never  Vaping Use   Vaping Use: Never used  Substance and Sexual Activity   Alcohol use: Yes    Comment: socially   Drug use: Never   Sexual activity: Not Currently  Other Topics Concern   Not on file  Social History Narrative   worked in [employer] office, short time. No cigarettes or drug use. Live by self no dietary restictions and walks daily   Social Determinants of Health   Financial Resource Strain: Low Risk    Difficulty of Paying Living Expenses: Not very hard  Food Insecurity: No Food Insecurity   Worried About Charity fundraiser in the Last Year: Never true   Ran Out of Food in the Last Year: Never true  Transportation Needs: No Transportation Needs   Lack of Transportation (Medical): No   Lack of Transportation (Non-Medical): No  Physical Activity: Inactive   Days of Exercise per Week: 0 days   Minutes of Exercise per Session: 0 min  Stress: No Stress Concern Present   Feeling of Stress : Not at all  Social Connections: Socially Isolated   Frequency of Communication with Friends and Family: More than three times a week   Frequency of Social Gatherings with Friends and Family: Once a week   Attends Religious Services: Never   Marine scientist or Organizations: No   Attends Archivist Meetings: Never   Marital Status: Widowed    Tobacco Counseling Counseling given: Not Answered   Clinical Intake:  Pre-visit preparation completed: Yes  Pain : No/denies pain     BMI - recorded: 22.62 Nutritional Status: BMI of 19-24  Normal Nutritional Risks: None Diabetes: No  How often do you need to have someone help you when you read instructions, pamphlets, or other written materials from your doctor or pharmacy?: 1 - Never  Diabetic?No  Interpreter Needed?:  No  Information entered by :: Caroleen Hamman LPN   Activities of Daily Living In your present state of health, do you have any difficulty performing the following activities: 08/05/2021  Hearing? N  Vision? N  Difficulty concentrating or making decisions? N  Walking or climbing stairs? N  Dressing or bathing? N  Doing errands, shopping? N  Preparing Food and eating ? N  Using the Toilet? N  In the past six months, have you accidently leaked urine? N  Do you have problems with loss of bowel control? N  Managing your Medications? N  Managing your Finances? N  Housekeeping or managing your Housekeeping? N  Some recent data might be hidden    Patient Care Team: Mosie Lukes, MD as PCP - General (Family Medicine) O'Neal, Cassie Freer, MD as PCP - Cardiology (Cardiology) Cherre Robins, PharmD (Pharmacist)  Indicate any recent Medical Services you may have  received from other than [facility] providers in the past year (date may be approximate).     Assessment:   This is a routine wellness examination for Cynthia Young.  Hearing/Vision screen Hearing Screening - Comments:: C/o mild hearing loss Vision Screening - Comments:: Last eye exam-several years ago Reading glasses  Dietary issues and exercise activities discussed: Current Exercise Habits: The patient does not participate in regular exercise at present, Exercise limited by: None identified   Goals Addressed             This Visit's Progress    Patient Stated       Increase water intake       Depression Screen PHQ 2/9 Scores 08/05/2021 06/12/2021 02/28/2020 03/30/2018  PHQ - 2 Score 0 0 0 1  PHQ- 9 Score - - 3 -    Fall Risk Fall Risk  08/05/2021 06/12/2021 04/30/2021 03/30/2018  Falls in the past year? 0 1 1 No  Number falls in past yr: 0 0 0 -  Injury with Fall? 0 0 1 -  Risk for fall due to : - History of fall(s) History of fall(s);Impaired mobility;Other (Comment) -  Risk for fall due to: Comment - - neuropathy -   Follow up Falls prevention discussed Falls evaluation completed Falls evaluation completed;Education provided -    FALL RISK PREVENTION PERTAINING TO THE HOME:  Any stairs in or around the home? No  Home free of loose throw rugs in walkways, pet beds, electrical cords, etc? Yes  Adequate lighting in your home to reduce risk of falls? Yes   ASSISTIVE DEVICES UTILIZED TO PREVENT FALLS:  Life alert? No  Use of a cane, walker or w/c? No  Grab bars in the bathroom? No  Shower chair or bench in shower? No  Elevated toilet seat or a handicapped toilet? No   TIMED UP AND GO:  Was the test performed? No . Phone visit   Cognitive Function:Normal cognitive status assessed by this Nurse Health Advisor. No abnormalities found.          Immunizations Immunization History  Administered Date(s) Administered   Fluad Quad(high Dose 65+) 09/04/2020   Moderna Sars-Covid-2 Vaccination 01/14/2020, 02/22/2020   PNEUMOCOCCAL CONJUGATE-20 06/12/2021    TDAP status: Due, Education has been provided regarding the importance of this vaccine. Advised may receive this vaccine at local pharmacy or Health Dept. Aware to provide a copy of the vaccination record if obtained from local pharmacy or Health Dept. Verbalized acceptance and understanding.  Flu Vaccine status: Due, Education has been provided regarding the importance of this vaccine. Advised may receive this vaccine at local pharmacy or Health Dept. Aware to provide a copy of the vaccination record if obtained from local pharmacy or Health Dept. Verbalized acceptance and understanding.  Pneumococcal vaccine status: Up to date  Covid-19 vaccine status: Information provided on how to obtain vaccines.   Qualifies for Shingles Vaccine? Yes   Zostavax completed No   Shingrix Completed?: No.    Education has been provided regarding the importance of this vaccine. Patient has been advised to call insurance company to determine out of pocket expense  if they have not yet received this vaccine. Advised may also receive vaccine at local pharmacy or Health Dept. Verbalized acceptance and understanding.  Screening Tests Health Maintenance  Topic Date Due   TETANUS/TDAP  Never done   Zoster Vaccines- Shingrix (1 of 2) Never done   COVID-19 Vaccine (3 - Booster for Moderna series) 07/24/2020   INFLUENZA  VACCINE  06/15/2021   DEXA SCAN  Completed   HPV VACCINES  Aged Out    Health Maintenance  Health Maintenance Due  Topic Date Due   TETANUS/TDAP  Never done   Zoster Vaccines- Shingrix (1 of 2) Never done   COVID-19 Vaccine (3 - Booster for Moderna series) 07/24/2020   INFLUENZA VACCINE  06/15/2021    Colorectal cancer screening: No longer required.   Mammogram status: No longer required due to patient decision..  Bone Density status; Declined  Lung Cancer Screening: (Low Dose CT Chest recommended if Age 65-80 years, 30 pack-year currently smoking OR have quit w/in 15years.) does not qualify.     Additional Screening:  Hepatitis C Screening: does not qualify  Vision Screening: Recommended annual ophthalmology exams for early detection of glaucoma and other disorders of the eye. Is the patient up to date with their annual eye exam?  No  Who is the provider or what is the name of the office in which the patient attends annual eye exams? Unknown-patient plans to make an appt soon   Dental Screening: Recommended annual dental exams for proper oral hygiene  Community Resource Referral / Chronic Care Management: CRR required this visit?  No   CCM required this visit?  No      Plan:     I have personally reviewed and noted the following in the patient's chart:   Medical and social history Use of alcohol, tobacco or illicit drugs  Current medications and supplements including opioid prescriptions. Patient is not currently taking opioid prescriptions. Functional ability and status Nutritional status Physical  activity Advanced directives List of other physicians Hospitalizations, surgeries, and ER visits in previous 12 months Vitals Screenings to include cognitive, depression, and falls Referrals and appointments  In addition, I have reviewed and discussed with patient certain preventive protocols, quality metrics, and best practice recommendations. A written personalized care plan for preventive services as well as general preventive health recommendations were provided to patient.   Due to this being a telephonic visit, the after visit summary with patients personalized plan was offered to patient via mail or my-chart. Per request, patient was mailed a copy of [place], LPN   01/04/2541  Nurse Health Advisor  Nurse Notes: None

## 2021-08-05 NOTE — Patient Instructions (Signed)
[name] , Thank you for taking time to complete your Medicare Wellness Visit. I appreciate your ongoing commitment to your health goals. Please review the following plan we discussed and let me know if I can assist you in the future.   Screening recommendations/referrals: Colonoscopy: No longer required Mammogram: Declined Bone Density: Declined Recommended yearly ophthalmology/optometry visit for glaucoma screening and checkup Recommended yearly dental visit for hygiene and checkup  Vaccinations: Influenza vaccine: Due-May obtain vaccine at our office or your local pharmacy Pneumococcal vaccine: Up to date Tdap vaccine: Discuss with pharmacy Shingles vaccine: Discuss with pharmacy   Covid-19:Booster available at the pharmacy.  Advanced directives: Declined information today.  Conditions/risks identified: See problem list  Next appointment: Follow up in one year for your annual wellness visit    Preventive Care 65 Years and Older, Female Preventive care refers to lifestyle choices and visits with your health care provider that can promote health and wellness. What does preventive care include? A yearly physical exam. This is also called an annual well check. Dental exams once or twice a year. Routine eye exams. Ask your health care provider how often you should have your eyes checked. Personal lifestyle choices, including: Daily care of your teeth and gums. Regular physical activity. Eating a healthy diet. Avoiding tobacco and drug use. Limiting alcohol use. Practicing safe sex. Taking low-dose aspirin every day. Taking vitamin and mineral supplements as recommended by your health care provider. What happens during an annual well check? The services and screenings done by your health care provider during your annual well check will depend on your age, overall health, lifestyle risk factors, and family history of disease. Counseling  Your health care provider may ask you  questions about your: Alcohol use. Tobacco use. Drug use. Emotional well-being. Home and relationship well-being. Sexual activity. Eating habits. History of falls. Memory and ability to understand (cognition). Work and work Statistician. Reproductive health. Screening  You may have the following tests or measurements: Height, weight, and BMI. Blood pressure. Lipid and cholesterol levels. These may be checked every 5 years, or more frequently if you are over 63 years old. Skin check. Lung cancer screening. You may have this screening every year starting at age 30 if you have a 30-pack-year history of smoking and currently smoke or have quit within the past 15 years. Fecal occult blood test (FOBT) of the stool. You may have this test every year starting at age 32. Flexible sigmoidoscopy or colonoscopy. You may have a sigmoidoscopy every 5 years or a colonoscopy every 10 years starting at age 53. Hepatitis C blood test. Hepatitis B blood test. Sexually transmitted disease (STD) testing. Diabetes screening. This is done by checking your blood sugar (glucose) after you have not eaten for a while (fasting). You may have this done every 1-3 years. Bone density scan. This is done to screen for osteoporosis. You may have this done starting at age 97. Mammogram. This may be done every 1-2 years. Talk to your health care provider about how often you should have regular mammograms. Talk with your health care provider about your test results, treatment options, and if necessary, the need for more tests. Vaccines  Your health care provider may recommend certain vaccines, such as: Influenza vaccine. This is recommended every year. Tetanus, diphtheria, and acellular pertussis (Tdap, Td) vaccine. You may need a Td booster every 10 years. Zoster vaccine. You may need this after age 106. Pneumococcal 13-valent conjugate (PCV13) vaccine. One dose is recommended after age  65. Pneumococcal polysaccharide  (PPSV23) vaccine. One dose is recommended after age 34. Talk to your health care provider about which screenings and vaccines you need and how often you need them. This information is not intended to replace advice given to you by your health care provider. Make sure you discuss any questions you have with your health care provider. Document Released: 11/28/2015 Document Revised: 07/21/2016 Document Reviewed: 09/02/2015 Elsevier Interactive Patient Education  2017 [place] Prevention in the Home Falls can cause injuries. They can happen to people of all ages. There are many things you can do to make your home safe and to help prevent falls. What can I do on the outside of my home? Regularly fix the edges of walkways and driveways and fix any cracks. Remove anything that might make you trip as you walk through a door, such as a raised step or threshold. Trim any bushes or trees on the path to your home. Use bright outdoor lighting. Clear any walking paths of anything that might make someone trip, such as rocks or tools. Regularly check to see if handrails are loose or broken. Make sure that both sides of any steps have handrails. Any raised decks and porches should have guardrails on the edges. Have any leaves, snow, or ice cleared regularly. Use sand or salt on walking paths during winter. Clean up any spills in your garage right away. This includes oil or grease spills. What can I do in the bathroom? Use night lights. Install grab bars by the toilet and in the tub and shower. Do not use towel bars as grab bars. Use non-skid mats or decals in the tub or shower. If you need to sit down in the shower, use a plastic, non-slip stool. Keep the floor dry. Clean up any water that spills on the floor as soon as it happens. Remove soap buildup in the tub or shower regularly. Attach bath mats securely with double-sided non-slip rug tape. Do not have throw rugs and other things on the  floor that can make you trip. What can I do in the bedroom? Use night lights. Make sure that you have a light by your bed that is easy to reach. Do not use any sheets or blankets that are too big for your bed. They should not hang down onto the floor. Have a firm chair that has side arms. You can use this for support while you get dressed. Do not have throw rugs and other things on the floor that can make you trip. What can I do in the kitchen? Clean up any spills right away. Avoid walking on wet floors. Keep items that you use a lot in easy-to-reach places. If you need to reach something above you, use a strong step stool that has a grab bar. Keep electrical cords out of the way. Do not use floor polish or wax that makes floors slippery. If you must use wax, use non-skid floor wax. Do not have throw rugs and other things on the floor that can make you trip. What can I do with my stairs? Do not leave any items on the stairs. Make sure that there are handrails on both sides of the stairs and use them. Fix handrails that are broken or loose. Make sure that handrails are as long as the stairways. Check any carpeting to make sure that it is firmly attached to the stairs. Fix any carpet that is loose or worn. Avoid having throw rugs at  the top or bottom of the stairs. If you do have throw rugs, attach them to the floor with carpet tape. Make sure that you have a light switch at the top of the stairs and the bottom of the stairs. If you do not have them, ask someone to add them for you. What else can I do to help prevent falls? Wear shoes that: Do not have high heels. Have rubber bottoms. Are comfortable and fit you well. Are closed at the toe. Do not wear sandals. If you use a stepladder: Make sure that it is fully opened. Do not climb a closed stepladder. Make sure that both sides of the stepladder are locked into place. Ask someone to hold it for you, if possible. Clearly mark and make  sure that you can see: Any grab bars or handrails. First and last steps. Where the edge of each step is. Use tools that help you move around (mobility aids) if they are needed. These include: Canes. Walkers. Scooters. Crutches. Turn on the lights when you go into a dark area. Replace any light bulbs as soon as they burn out. Set up your furniture so you have a clear path. Avoid moving your furniture around. If any of your floors are uneven, fix them. If there are any pets around you, be aware of where they are. Review your medicines with your doctor. Some medicines can make you feel dizzy. This can increase your chance of falling. Ask your doctor what other things that you can do to help prevent falls. This information is not intended to replace advice given to you by your health care provider. Make sure you discuss any questions you have with your health care provider. Document Released: 08/28/2009 Document Revised: 04/08/2016 Document Reviewed: 12/06/2014 Elsevier Interactive Patient Education  2017 [employer].

## 2021-08-10 ENCOUNTER — Telehealth: Payer: Self-pay | Admitting: Cardiovascular Disease

## 2021-08-10 NOTE — Telephone Encounter (Signed)
Returned call to patient of Dr. Audie Box concerning BP  She reports her BP was 219 systolic this morning at home and also at senior center. She said the senior center rep could not find her pulse so she got very worried  Her current BP is 151/71  She reports a headache - she asked if OK to take tylenol/ibuprofen  She monitors salt in her diet  She reports one other episode of elevated BP 1 year ago - not a persistent issues  She denies any speech difficulties, balance issues  Advised to take OTC pain med Advised her monitor her BP twice daily for 1 week and send in readings Advised if she has persistently elevated SBP 160s-170s to call office and/or with symptoms of blurry vision, persistent headache, balance issues, chest pain, shortness of breath  Advised will send message to MD to review/advise - she does not any antihypertensives at this point

## 2021-08-10 NOTE — Telephone Encounter (Signed)
Pt c/o BP issue: STAT if pt c/o blurred vision, one-sided weakness or slurred speech  1. What are your last 5 BP readings? 172/82  2. Are you having any other symptoms (ex. Dizziness, headache, blurred vision, passed out)? Headache, taking deeper breaths   3. What is your BP issue? Patient states her BP is high and she has a bad headache. She states her breathing is "slow", but was not SOB on the phone. She would like a call back as soon as possible.

## 2021-08-11 NOTE — Telephone Encounter (Signed)
Left message to call back  

## 2021-08-11 NOTE — Telephone Encounter (Signed)
Geralynn Rile, MD  Fidel Levy, RN Cc: Caprice Beaver, LPN Caller: Unspecified (Yesterday,  4:01 PM) Would have her check her BP for few days.   She has known bradycardia and refused pacemaker. I suspect this as the pulse issue.   [place] T. Audie Box, MD, [place]  [address], [place]  [place], [place] [address]  [phone]  5:04 PM

## 2021-08-17 NOTE — Telephone Encounter (Signed)
Called patient- advised of message below.  Patient states that she will check her BP for the next few days and call us back with her BP readings. She states it has still been a little elevated but she will start a BP log to call back with. Patient thankful for call back.

## 2021-08-27 ENCOUNTER — Telehealth: Payer: Self-pay | Admitting: Family Medicine

## 2021-08-27 NOTE — Telephone Encounter (Signed)
Pt would like to know if labs can be order to check her BP and potassium. Please advise.

## 2021-08-28 ENCOUNTER — Other Ambulatory Visit: Payer: Self-pay

## 2021-08-28 DIAGNOSIS — E875 Hyperkalemia: Secondary | ICD-10-CM

## 2021-08-28 NOTE — Telephone Encounter (Signed)
Spoke with pt and she is scheduled. Lab placed

## 2021-09-01 ENCOUNTER — Other Ambulatory Visit (INDEPENDENT_AMBULATORY_CARE_PROVIDER_SITE_OTHER): Payer: Medicare Other

## 2021-09-01 ENCOUNTER — Other Ambulatory Visit: Payer: Self-pay

## 2021-09-01 DIAGNOSIS — E875 Hyperkalemia: Secondary | ICD-10-CM | POA: Diagnosis not present

## 2021-09-01 LAB — COMPREHENSIVE METABOLIC PANEL
ALT: 10 U/L (ref 0–35)
AST: 15 U/L (ref 0–37)
Albumin: 4 g/dL (ref 3.5–5.2)
Alkaline Phosphatase: 54 U/L (ref 39–117)
BUN: 16 mg/dL (ref 6–23)
CO2: 30 mEq/L (ref 19–32)
Calcium: 9.7 mg/dL (ref 8.4–10.5)
Chloride: 103 mEq/L (ref 96–112)
Creatinine, Ser: 0.84 mg/dL (ref 0.40–1.20)
GFR: 61.53 mL/min (ref >60.00)
Glucose, Bld: 72 mg/dL (ref 70–99)
Potassium: 5.3 mEq/L — ABNORMAL HIGH (ref 3.5–5.1)
Sodium: 139 mEq/L (ref 135–145)
Total Bilirubin: 0.4 mg/dL (ref 0.2–1.2)
Total Protein: 6.5 g/dL (ref 6.0–8.3)

## 2021-09-02 ENCOUNTER — Telehealth: Payer: Self-pay | Admitting: Family Medicine

## 2021-09-02 NOTE — Telephone Encounter (Signed)
Spoke with patient.

## 2021-09-02 NOTE — Telephone Encounter (Signed)
Pt returned phone call regarding lab results. Pt has an appointment at the dentist 10:30 and will be returning at 2:30. Please advise.

## 2021-09-17 ENCOUNTER — Other Ambulatory Visit: Payer: Self-pay | Admitting: Family Medicine

## 2021-12-17 ENCOUNTER — Encounter: Payer: Self-pay | Admitting: Family Medicine

## 2021-12-17 ENCOUNTER — Ambulatory Visit (INDEPENDENT_AMBULATORY_CARE_PROVIDER_SITE_OTHER): Payer: Medicare Other | Admitting: Family Medicine

## 2021-12-17 VITALS — BP 132/57 | HR 55 | Temp 98.0°F | Resp 16 | Ht 59.0 in | Wt 113.0 lb

## 2021-12-17 DIAGNOSIS — K219 Gastro-esophageal reflux disease without esophagitis: Secondary | ICD-10-CM | POA: Diagnosis not present

## 2021-12-17 DIAGNOSIS — E785 Hyperlipidemia, unspecified: Secondary | ICD-10-CM | POA: Diagnosis not present

## 2021-12-17 DIAGNOSIS — E039 Hypothyroidism, unspecified: Secondary | ICD-10-CM

## 2021-12-17 DIAGNOSIS — R7303 Prediabetes: Secondary | ICD-10-CM | POA: Diagnosis not present

## 2021-12-17 DIAGNOSIS — Z23 Encounter for immunization: Secondary | ICD-10-CM | POA: Diagnosis not present

## 2021-12-17 DIAGNOSIS — E875 Hyperkalemia: Secondary | ICD-10-CM | POA: Diagnosis not present

## 2021-12-17 DIAGNOSIS — G3184 Mild cognitive impairment, so stated: Secondary | ICD-10-CM

## 2021-12-17 DIAGNOSIS — K449 Diaphragmatic hernia without obstruction or gangrene: Secondary | ICD-10-CM

## 2021-12-17 DIAGNOSIS — R739 Hyperglycemia, unspecified: Secondary | ICD-10-CM

## 2021-12-17 LAB — COMPREHENSIVE METABOLIC PANEL
ALT: 9 U/L (ref 0–35)
AST: 15 U/L (ref 0–37)
Albumin: 4.3 g/dL (ref 3.5–5.2)
Alkaline Phosphatase: 57 U/L (ref 39–117)
BUN: 16 mg/dL (ref 6–23)
CO2: 32 mEq/L (ref 19–32)
Calcium: 10.4 mg/dL (ref 8.4–10.5)
Chloride: 104 mEq/L (ref 96–112)
Creatinine, Ser: 0.99 mg/dL (ref 0.40–1.20)
GFR: 50.41 mL/min — ABNORMAL LOW (ref >60.00)
Glucose, Bld: 97 mg/dL (ref 70–99)
Potassium: 6 mEq/L — ABNORMAL HIGH (ref 3.5–5.1)
Sodium: 141 mEq/L (ref 135–145)
Total Bilirubin: 0.6 mg/dL (ref 0.2–1.2)
Total Protein: 6.9 g/dL (ref 6.0–8.3)

## 2021-12-17 LAB — CBC WITH DIFFERENTIAL/PLATELET
Basophils Absolute: 0.1 10*3/uL (ref 0.0–0.1)
Basophils Relative: 1.1 % (ref 0.0–3.0)
Eosinophils Absolute: 0.2 10*3/uL (ref 0.0–0.7)
Eosinophils Relative: 2.8 % (ref 0.0–5.0)
HCT: 44.2 % (ref 36.0–46.0)
Hemoglobin: 14.1 g/dL (ref 12.0–15.0)
Lymphocytes Relative: 31.6 % (ref 12.0–46.0)
Lymphs Abs: 2.1 10*3/uL (ref 0.7–4.0)
MCHC: 32 g/dL (ref 30.0–36.0)
MCV: 93.4 fl (ref 78.0–100.0)
Monocytes Absolute: 0.6 10*3/uL (ref 0.1–1.0)
Monocytes Relative: 8.9 % (ref 3.0–12.0)
Neutro Abs: 3.7 10*3/uL (ref 1.4–7.7)
Neutrophils Relative %: 55.6 % (ref 43.0–77.0)
Platelets: 248 10*3/uL (ref 150.0–400.0)
RBC: 4.73 Mil/uL (ref 3.87–5.11)
RDW: 13.6 % (ref 11.5–15.5)
WBC: 6.7 10*3/uL (ref 4.0–10.5)

## 2021-12-17 LAB — LIPID PANEL
Cholesterol: 211 mg/dL — ABNORMAL HIGH (ref 0–200)
HDL: 65.6 mg/dL (ref >39.00)
LDL Cholesterol: 125 mg/dL — ABNORMAL HIGH (ref 0–99)
NonHDL: 145.16
Total CHOL/HDL Ratio: 3
Triglycerides: 99 mg/dL (ref 0.0–149.0)
VLDL: 19.8 mg/dL (ref 0.0–40.0)

## 2021-12-17 LAB — TSH: TSH: 2.12 u[IU]/mL (ref 0.35–5.50)

## 2021-12-17 LAB — HEMOGLOBIN A1C: Hgb A1c MFr Bld: 6 % (ref 4.6–6.5)

## 2021-12-17 NOTE — Assessment & Plan Note (Signed)
Doing well at home with family support

## 2021-12-17 NOTE — Progress Notes (Signed)
Subjective:    Patient ID: Cynthia Young, female    DOB: 1930-02-13, 88 y.o.   MRN: [id number]  Chief Complaint  Patient presents with   Follow-up   HPI Patient is in today for for follow-up on chronic medical concerns.  No recent febrile illness or hospitalizations.  She struggled with multiple cardiac complications this past year but is feeling very well at this time and has a follow-up with cardiology soon.  She does continue to have intermittent trouble with diarrhea but it is not every day nor is it bloody.  No fevers or chills.  No severe abdominal pain.  No other acute complaints noted today's visit although she does still have occasional dyspepsia from her hiatal hernia and even a sense of things catching at times although she denies any active choking. Denies CP/palp/SOB/HA/congestion/fevers or GU c/o. Taking meds as prescribed   Past Medical History:  Diagnosis Date   Arthritis    Hyperglycemia 09/04/2020   Hyperlipidemia    Hypothyroid     Past Surgical History:  Procedure Laterality Date   BACK SURGERY     BIOPSY  04/12/2020   Procedure: BIOPSY;  Surgeon: Rush Landmark Telford Nab., MD;  Location: [facility];  Service: Gastroenterology;;   Lillard Anes Left    CHOLECYSTECTOMY N/A 04/11/2020   Procedure: LAPAROSCOPIC CHOLECYSTECTOMY WITH INTRAOPERATIVE CHOLANGIOGRAM;  Surgeon: Alphonsa Overall, MD;  Location: [facility];  Service: General;  Laterality: N/A;   ERCP N/A 04/12/2020   Procedure: ENDOSCOPIC RETROGRADE CHOLANGIOPANCREATOGRAPHY (ERCP);  Surgeon: Irving Copas., MD;  Location: [facility];  Service: Gastroenterology;  Laterality: N/A;   FOOT SURGERY Right    bunion   REMOVAL OF STONES  04/12/2020   Procedure: REMOVAL OF STONES;  Surgeon: Rush Landmark Telford Nab., MD;  Location: [facility];  Service: Gastroenterology;;   [name]  04/12/2020   Procedure: [name];  Surgeon: Mansouraty, Telford Nab., MD;  Location: [facility];  Service:  Gastroenterology;;    Family History  Problem Relation Age of Onset   Cancer Mother    Heart disease Father    Arthritis Father        rheumatoid   Cancer Brother        colon   Colon cancer Brother    Esophageal cancer Neg Hx    Inflammatory bowel disease Neg Hx    Liver disease Neg Hx    Pancreatic cancer Neg Hx    Stomach cancer Neg Hx     Social History   Socioeconomic History   Marital status: Widowed    Spouse name: Not on file   Number of children: Not on file   Years of education: Not on file   Highest education level: Not on file  Occupational History   Not on file  Tobacco Use   Smoking status: Never   Smokeless tobacco: Never  Vaping Use   Vaping Use: Never used  Substance and Sexual Activity   Alcohol use: Yes    Comment: socially   Drug use: Never   Sexual activity: Not Currently  Other Topics Concern   Not on file  Social History Narrative   worked in [employer] office, short time. No cigarettes or drug use. Live by self no dietary restictions and walks daily   Social Determinants of Health   Financial Resource Strain: Low Risk    Difficulty of Paying Living Expenses: Not very hard  Food Insecurity: No Food Insecurity   Worried About Running Out of Food in the Last Year: Never true  Ran Out of Food in the Last Year: Never true  Transportation Needs: No Transportation Needs   Lack of Transportation (Medical): No   Lack of Transportation (Non-Medical): No  Physical Activity: Inactive   Days of Exercise per Week: 0 days   Minutes of Exercise per Session: 0 min  Stress: No Stress Concern Present   Feeling of Stress : Not at all  Social Connections: Socially Isolated   Frequency of Communication with Friends and Family: More than three times a week   Frequency of Social Gatherings with Friends and Family: Once a week   Attends Religious Services: Never   Marine scientist or Organizations: No   Attends Archivist Meetings: Never    Marital Status: Widowed  Human resources officer Violence: Not At Risk   Fear of Current or Ex-Partner: No   Emotionally Abused: No   Physically Abused: No   Sexually Abused: No    Outpatient Medications Prior to Visit  Medication Sig Dispense Refill   levothyroxine (SYNTHROID) 50 MCG tablet TAKE ONE TABLET BY MOUTH ONE TIME DAILY 90 tablet 1   No facility-administered medications prior to visit.    No Known Allergies  Review of Systems  Constitutional:  Negative for fever and malaise/fatigue.  HENT:  Negative for congestion.   Eyes:  Negative for blurred vision.  Respiratory:  Negative for shortness of breath.   Cardiovascular:  Negative for chest pain, palpitations and leg swelling.  Gastrointestinal:  Positive for diarrhea. Negative for abdominal pain, blood in stool and nausea.  Genitourinary:  Negative for dysuria and frequency.  Musculoskeletal:  Positive for back pain. Negative for falls.  Skin:  Negative for rash.  Neurological:  Negative for dizziness, loss of consciousness and headaches.  Endo/Heme/Allergies:  Negative for environmental allergies.  Psychiatric/Behavioral:  Negative for depression and suicidal ideas. The patient is not nervous/anxious.       Objective:    Physical Exam Constitutional:      General: She is not in acute distress.    Appearance: She is well-developed.  HENT:     Head: Normocephalic and atraumatic.  Eyes:     Conjunctiva/sclera: Conjunctivae normal.  Neck:     Thyroid: No thyromegaly.  Cardiovascular:     Rate and Rhythm: Regular rhythm. Bradycardia present.     Heart sounds: Normal heart sounds. No murmur heard. Pulmonary:     Effort: Pulmonary effort is normal. No respiratory distress.     Breath sounds: Normal breath sounds.  Abdominal:     General: Bowel sounds are normal. There is no distension.     Palpations: Abdomen is soft. There is no mass.     Tenderness: There is no abdominal tenderness.  Musculoskeletal:     Cervical  back: Neck supple.  Lymphadenopathy:     Cervical: No cervical adenopathy.  Skin:    General: Skin is warm and dry.  Neurological:     Mental Status: She is alert and oriented to person, place, and time.  Psychiatric:        Behavior: Behavior normal.    BP (!) 132/57    Pulse (!) 55    Temp 98 F (36.7 C)    Resp 16    Ht 4\' 11"  (1.499 m)    Wt 113 lb (51.3 kg)    SpO2 100%    BMI 22.82 kg/m  Wt Readings from Last 3 Encounters:  12/17/21 113 lb (51.3 kg)  08/05/21 112 lb (50.8 kg)  08/04/21  112 lb (50.8 kg)    Diabetic Foot Exam - Simple   No data filed    Lab Results  Component Value Date   WBC 7.9 01/14/2021   HGB 14.4 01/14/2021   HCT 43.8 01/14/2021   PLT 266 01/14/2021   GLUCOSE 72 09/01/2021   CHOL 194 01/12/2021   TRIG 121.0 01/12/2021   HDL 59.00 01/12/2021   LDLCALC 110 (H) 01/12/2021   ALT 10 09/01/2021   AST 15 09/01/2021   NA 139 09/01/2021   K 5.3 (H) 09/01/2021   CL 103 09/01/2021   CREATININE 0.84 09/01/2021   BUN 16 09/01/2021   CO2 30 09/01/2021   TSH 1.69 02/02/2021   HGBA1C 6.1 06/12/2021    Lab Results  Component Value Date   TSH 1.69 02/02/2021   Lab Results  Component Value Date   WBC 7.9 01/14/2021   HGB 14.4 01/14/2021   HCT 43.8 01/14/2021   MCV 93.4 01/14/2021   PLT 266 01/14/2021   Lab Results  Component Value Date   NA 139 09/01/2021   K 5.3 (H) 09/01/2021   CO2 30 09/01/2021   GLUCOSE 72 09/01/2021   BUN 16 09/01/2021   CREATININE 0.84 09/01/2021   BILITOT 0.4 09/01/2021   ALKPHOS 54 09/01/2021   AST 15 09/01/2021   ALT 10 09/01/2021   PROT 6.5 09/01/2021   ALBUMIN 4.0 09/01/2021   CALCIUM 9.7 09/01/2021   ANIONGAP 10 01/14/2021   GFR 61.53 09/01/2021   Lab Results  Component Value Date   CHOL 194 01/12/2021   Lab Results  Component Value Date   HDL 59.00 01/12/2021   Lab Results  Component Value Date   LDLCALC 110 (H) 01/12/2021   Lab Results  Component Value Date   TRIG 121.0 01/12/2021   Lab  Results  Component Value Date   CHOLHDL 3 01/12/2021   Lab Results  Component Value Date   HGBA1C 6.1 06/12/2021       Assessment & Plan:   Problem List Items Addressed This Visit     Mild cognitive impairment (Chronic)    Doing well at home with family support      Hypothyroid    On Levothyroxine, continue to monitor      Relevant Orders   TSH   Hyperlipidemia    Labs stable no new concerns, no changesl      Relevant Orders   CBC with Differential/Platelet   Comprehensive metabolic panel   Lipid panel   Hiatal hernia with GERD    Avoid offending foods, start probiotics. Do not eat large meals in late evening and consider raising head of bed. Can try Tums or Famotidine 10-20 mg at bedtime. Notes sometimes with rich foods she has diarrhea not bloody      Hyperglycemia    hgba1c acceptable, minimize simple carbs. Increase exercise as tolerated.        Relevant Orders   Hemoglobin A1c   Hyperkalemia    Check cmp today      RESOLVED: Prediabetes    hgba1c acceptable, minimize simple carbs. Increase exercise as tolerated.       Other Visit Diagnoses     Pre-diabetes    -  Primary   Relevant Orders   Hemoglobin A1c       I am having Cynthia Young maintain her levothyroxine.  No orders of the defined types were placed in this encounter.    Penni Homans, MD

## 2021-12-17 NOTE — Assessment & Plan Note (Signed)
hgba1c acceptable, minimize simple carbs. Increase exercise as tolerated.  

## 2021-12-17 NOTE — Assessment & Plan Note (Signed)
Labs stable no new concerns, no changesl

## 2021-12-17 NOTE — Patient Instructions (Addendum)
Consider Mylant or Tums or Famotidine/Pepcid 10 to 20 mg in evening for the heartburn from the hernia   Benefiber powder once to twice daily mixed in food or beverage to help loose stool  Hiatal Hernia A hiatal hernia occurs when part of the stomach slides above the muscle that separates the abdomen from the chest (diaphragm). A person can be born with a hiatal hernia (congenital), or it may develop over time. In almost all cases of hiatal hernia, only the top part of the stomach pushes through the diaphragm. Many people have a hiatal hernia with no symptoms. The larger the hernia, the more likely it is that you will have symptoms. In some cases, a hiatal hernia allows stomach acid to flow back into the tube that carries food from your mouth to your stomach (esophagus). This may cause heartburn symptoms. Severe heartburn symptoms may mean that you have developed a condition called gastroesophageal reflux disease (GERD). What are the causes? This condition is caused by a weakness in the opening (hiatus) where the esophagus passes through the diaphragm to attach to the upper part of the stomach. A person may be born with a weakness in the hiatus, or a weakness can develop over time. What increases the risk? This condition is more likely to develop in: Older people. Age is a major risk factor for a hiatal hernia, especially if you are over the age of 45. Pregnant women. People who are overweight. People who have frequent constipation. What are the signs or symptoms? Symptoms of this condition usually develop in the form of GERD symptoms. Symptoms include: Heartburn. Belching. Indigestion. Trouble swallowing. Coughing or wheezing. Sore throat. Hoarseness. Chest pain. Nausea and vomiting. How is this diagnosed? This condition may be diagnosed during testing for GERD. Tests that may be done include: X-rays of your stomach or chest. An upper gastrointestinal (GI) series. This is an X-ray exam  of your GI tract that is taken after you swallow a chalky liquid that shows up clearly on the X-ray. Endoscopy. This is a procedure to look into your stomach using a thin, flexible tube that has a tiny camera and light on the end of it. How is this treated? This condition may be treated by: Dietary and lifestyle changes to help reduce GERD symptoms. Medicines. These may include: Over-the-counter antacids. Medicines that make your stomach empty more quickly. Medicines that block the production of stomach acid (H2 blockers). Stronger medicines to reduce stomach acid (proton pump inhibitors). Surgery to repair the hernia, if other treatments are not helping. If you have no symptoms, you may not need treatment. Follow these instructions at home: Lifestyle and activity Do not use any products that contain nicotine or tobacco, such as cigarettes and e-cigarettes. If you need help quitting, ask your health care provider. Try to achieve and maintain a healthy body weight. Avoid putting pressure on your abdomen. Anything that puts pressure on your abdomen increases the amount of acid that may be pushed up into your esophagus. Avoid bending over, especially after eating. Raise the head of your bed by putting blocks under the legs. This keeps your head and esophagus higher than your stomach. Do not wear tight clothing around your chest or stomach. Try not to strain when having a bowel movement, when urinating, or when lifting heavy objects. Eating and drinking Avoid foods that can worsen GERD symptoms. These may include: Fatty foods, like fried foods. Citrus fruits, like oranges or lemon. Other foods and drinks that contain acid,  like orange juice or tomatoes. Spicy food. Chocolate. Eat frequent small meals instead of three large meals a day. This helps prevent your stomach from getting too full. Eat slowly. Do not lie down right after eating. Do not eat 1-2 hours before bed. Do not drink  beverages with caffeine. These include cola, coffee, cocoa, and tea. Do not drink alcohol. General instructions Take over-the-counter and prescription medicines only as told by your health care provider. Keep all follow-up visits as told by your health care provider. This is important. Contact a health care provider if: Your symptoms are not controlled with medicines or lifestyle changes. You are having trouble swallowing. You have coughing or wheezing that will not go away. Get help right away if: Your pain is getting worse. Your pain spreads to your arms, neck, jaw, teeth, or back. You have shortness of breath. You sweat for no reason. You feel sick to your stomach (nauseous) or you vomit. You vomit blood. You have bright red blood in your stools. You have black, tarry stools. Summary A hiatal hernia occurs when part of the stomach slides above the muscle that separates the abdomen from the chest (diaphragm). A person may be born with a weakness in the hiatus, or a weakness can develop over time. Symptoms of hiatal hernia may include heartburn, trouble swallowing, or sore throat. Management of hiatal hernia includes eating frequent small meals instead of three large meals a day. Get help right away if you vomit blood, have bright red blood in your stools, or have black, tarry stools. This information is not intended to replace advice given to you by your health care provider. Make sure you discuss any questions you have with your health care provider. Document Revised: 10/02/2020 Document Reviewed: 10/02/2020 Elsevier Patient Education  2022 [employer].

## 2021-12-17 NOTE — Assessment & Plan Note (Signed)
Check cmp today 

## 2021-12-17 NOTE — Assessment & Plan Note (Signed)
On Levothyroxine, continue to monitor 

## 2021-12-17 NOTE — Assessment & Plan Note (Addendum)
Avoid offending foods, start probiotics. Do not eat large meals in late evening and consider raising head of bed. Can try Tums or Famotidine 10-20 mg at bedtime. Notes sometimes with rich foods she has diarrhea not bloody

## 2021-12-18 ENCOUNTER — Other Ambulatory Visit: Payer: Self-pay

## 2021-12-18 ENCOUNTER — Other Ambulatory Visit: Payer: Self-pay | Admitting: Family Medicine

## 2021-12-18 ENCOUNTER — Other Ambulatory Visit: Payer: Medicare Other

## 2021-12-18 DIAGNOSIS — I441 Atrioventricular block, second degree: Secondary | ICD-10-CM

## 2021-12-18 DIAGNOSIS — R001 Bradycardia, unspecified: Secondary | ICD-10-CM

## 2021-12-18 DIAGNOSIS — E039 Hypothyroidism, unspecified: Secondary | ICD-10-CM

## 2021-12-18 DIAGNOSIS — I5189 Other ill-defined heart diseases: Secondary | ICD-10-CM

## 2021-12-18 DIAGNOSIS — E785 Hyperlipidemia, unspecified: Secondary | ICD-10-CM

## 2021-12-18 DIAGNOSIS — E875 Hyperkalemia: Secondary | ICD-10-CM

## 2021-12-18 DIAGNOSIS — R739 Hyperglycemia, unspecified: Secondary | ICD-10-CM

## 2021-12-20 NOTE — Progress Notes (Deleted)
Cardiology Office Note:   Date:  12/20/2021  NAME:  Cynthia Young    MRN: [id number] DOB:  [birth year]   PCP:  Mosie Lukes, MD  Cardiologist:  Evalina Field, MD  Electrophysiologist:  None   Referring MD: Mosie Lukes, MD   No chief complaint on file. ***  History of Present Illness:   Cynthia Young is a 88 y.o. female with a hx of Second degree AV block, GERD, hiatal hernia who presents for follow-up.     Problem List 1. HLD -T chol 211, HDL 65, LDL 125, TG 99 2.  Second Degree AV Block -Mobtiz 1 & 2 present 07/09/2021 3. GERD 4. Hiatal hernia   Past Medical History: Past Medical History:  Diagnosis Date   Arthritis    Hyperglycemia 09/04/2020   Hyperlipidemia    Hypothyroid     Past Surgical History: Past Surgical History:  Procedure Laterality Date   BACK SURGERY     BIOPSY  04/12/2020   Procedure: BIOPSY;  Surgeon: Rush Landmark Telford Nab., MD;  Location: [facility];  Service: Gastroenterology;;   Lillard Anes Left    CHOLECYSTECTOMY N/A 04/11/2020   Procedure: LAPAROSCOPIC CHOLECYSTECTOMY WITH INTRAOPERATIVE CHOLANGIOGRAM;  Surgeon: Alphonsa Overall, MD;  Location: [facility];  Service: General;  Laterality: N/A;   ERCP N/A 04/12/2020   Procedure: ENDOSCOPIC RETROGRADE CHOLANGIOPANCREATOGRAPHY (ERCP);  Surgeon: Irving Copas., MD;  Location: [facility];  Service: Gastroenterology;  Laterality: N/A;   FOOT SURGERY Right    bunion   REMOVAL OF STONES  04/12/2020   Procedure: REMOVAL OF STONES;  Surgeon: Rush Landmark Telford Nab., MD;  Location: [facility];  Service: Gastroenterology;;   [name]  04/12/2020   Procedure: [name];  Surgeon: Mansouraty, Telford Nab., MD;  Location: [facility];  Service: Gastroenterology;;    Current Medications: No outpatient medications have been marked as taking for the 12/22/21 encounter (Appointment) with Geralynn Rile, MD.     Allergies:    Patient has no known allergies.   Social  History: Social History   Socioeconomic History   Marital status: Widowed    Spouse name: Not on file   Number of children: Not on file   Years of education: Not on file   Highest education level: Not on file  Occupational History   Not on file  Tobacco Use   Smoking status: Never   Smokeless tobacco: Never  Vaping Use   Vaping Use: Never used  Substance and Sexual Activity   Alcohol use: Yes    Comment: socially   Drug use: Never   Sexual activity: Not Currently  Other Topics Concern   Not on file  Social History Narrative   worked in [employer] office, short time. No cigarettes or drug use. Live by self no dietary restictions and walks daily   Social Determinants of Health   Financial Resource Strain: Low Risk    Difficulty of Paying Living Expenses: Not very hard  Food Insecurity: No Food Insecurity   Worried About Charity fundraiser in the Last Year: Never true   Ran Out of Food in the Last Year: Never true  Transportation Needs: No Transportation Needs   Lack of Transportation (Medical): No   Lack of Transportation (Non-Medical): No  Physical Activity: Inactive   Days of Exercise per Week: 0 days   Minutes of Exercise per Session: 0 min  Stress: No Stress Concern Present   Feeling of Stress : Not at all  Social Connections: Socially Isolated  Frequency of Communication with Friends and Family: More than three times a week   Frequency of Social Gatherings with Friends and Family: Once a week   Attends Religious Services: Never   Marine scientist or Organizations: No   Attends Archivist Meetings: Never   Marital Status: Widowed     Family History: The patient's ***family history includes Arthritis in her father; Cancer in her brother and mother; Colon cancer in her brother; Heart disease in her father. There is no history of Esophageal cancer, Inflammatory bowel disease, Liver disease, Pancreatic cancer, or Stomach cancer.  ROS:   All other ROS  reviewed and negative. Pertinent positives noted in the HPI.     EKGs/Labs/Other Studies Reviewed:   The following studies were personally reviewed by me today:  EKG:  EKG is *** ordered today.  The ekg ordered today demonstrates ***, and was personally reviewed by me.   TTE 05/23/2020  1. Mobitz II AVB was noted during the study   2. Left ventricular ejection fraction, by estimation, is 65 to 70%. The  left ventricle has normal function. The left ventricle has no regional  wall motion abnormalities. Left ventricular diastolic parameters are  consistent with Grade I diastolic  dysfunction (impaired relaxation).   3. Right ventricular systolic function is normal. The right ventricular  size is normal. There is normal pulmonary artery systolic pressure. The  estimated right ventricular systolic pressure is 02.4 mmHg.   4. The mitral valve is grossly normal. Trivial mitral valve  regurgitation.   5. The aortic valve is tricuspid. Aortic valve regurgitation is not  visualized.   6. The inferior vena cava is normal in size with greater than 50%  respiratory variability, suggesting right atrial pressure of 3 mmHg.  Zio 07/09/2021 Impression: 1. 2 episodes of 2:1 block were present as described above.  2. Second degree AV block, Mobitz 1 was present and symptoms were reported with this.  3. No arrhythmias detected.   Recent Labs: 01/14/2021: Magnesium 2.1 12/17/2021: ALT 9; BUN 16; Creatinine, Ser 0.99; Hemoglobin 14.1; Platelets 248.0; Potassium 6.0 No hemolysis seen; Sodium 141; TSH 2.12   Recent Lipid Panel    Component Value Date/Time   CHOL 211 (H) 12/17/2021 1041   TRIG 99.0 12/17/2021 1041   HDL 65.60 12/17/2021 1041   CHOLHDL 3 12/17/2021 1041   VLDL 19.8 12/17/2021 1041   LDLCALC 125 (H) 12/17/2021 1041   LDLCALC 128 (H) 09/04/2020 1125    Physical Exam:   VS:  There were no vitals taken for this visit.   Wt Readings from Last 3 Encounters:  12/17/21 113 lb (51.3 kg)   08/05/21 112 lb (50.8 kg)  08/04/21 112 lb (50.8 kg)    General: Well nourished, well developed, in no acute distress Head: Atraumatic, normal size  Eyes: PEERLA, EOMI  Neck: Supple, no JVD Endocrine: No thryomegaly Cardiac: Normal S1, S2; RRR; no murmurs, rubs, or gallops Lungs: Clear to auscultation bilaterally, no wheezing, rhonchi or rales  Abd: Soft, nontender, no hepatomegaly  Ext: No edema, pulses 2+ Musculoskeletal: No deformities, BUE and BLE strength normal and equal Skin: Warm and dry, no rashes   Neuro: Alert and oriented to person, place, time, and situation, CNII-XII grossly intact, no focal deficits  Psych: Normal mood and affect   ASSESSMENT:   Cynthia Young is a 88 y.o. female who presents for the following: No diagnosis found.  PLAN:   There are no diagnoses linked to this encounter.  {  Are you ordering a CV Procedure (e.g. stress test, cath, DCCV, TEE, etc)?   Press F2        :[id number]}  Disposition: No follow-ups on file.  Medication Adjustments/Labs and Tests Ordered: Current medicines are reviewed at length with the patient today.  Concerns regarding medicines are outlined above.  No orders of the defined types were placed in this encounter.  No orders of the defined types were placed in this encounter.   There are no Patient Instructions on file for this visit.   Time Spent with Patient: I have spent a total of *** minutes with patient reviewing hospital notes, telemetry, EKGs, labs and examining the patient as well as establishing an assessment and plan that was discussed with the patient.  > 50% of time was spent in direct patient care.  Signed, Addison Naegeli. Audie Box, MD, [facility]  [address], [place], [place] [address] [phone]  12/20/2021 3:55 PM

## 2021-12-21 ENCOUNTER — Other Ambulatory Visit (INDEPENDENT_AMBULATORY_CARE_PROVIDER_SITE_OTHER): Payer: Medicare Other

## 2021-12-21 DIAGNOSIS — E875 Hyperkalemia: Secondary | ICD-10-CM | POA: Diagnosis not present

## 2021-12-21 LAB — COMPREHENSIVE METABOLIC PANEL
ALT: 9 U/L (ref 0–35)
AST: 15 U/L (ref 0–37)
Albumin: 4.3 g/dL (ref 3.5–5.2)
Alkaline Phosphatase: 58 U/L (ref 39–117)
BUN: 20 mg/dL (ref 6–23)
CO2: 30 mEq/L (ref 19–32)
Calcium: 9.7 mg/dL (ref 8.4–10.5)
Chloride: 104 mEq/L (ref 96–112)
Creatinine, Ser: 0.89 mg/dL (ref 0.40–1.20)
GFR: 57.28 mL/min — ABNORMAL LOW (ref >60.00)
Glucose, Bld: 85 mg/dL (ref 70–99)
Potassium: 4.6 mEq/L (ref 3.5–5.1)
Sodium: 142 mEq/L (ref 135–145)
Total Bilirubin: 0.4 mg/dL (ref 0.2–1.2)
Total Protein: 6.7 g/dL (ref 6.0–8.3)

## 2021-12-22 ENCOUNTER — Ambulatory Visit: Payer: Medicare Other | Admitting: Cardiovascular Disease

## 2021-12-22 DIAGNOSIS — R42 Dizziness and giddiness: Secondary | ICD-10-CM

## 2021-12-22 DIAGNOSIS — I441 Atrioventricular block, second degree: Secondary | ICD-10-CM

## 2021-12-22 DIAGNOSIS — R001 Bradycardia, unspecified: Secondary | ICD-10-CM

## 2022-01-26 ENCOUNTER — Other Ambulatory Visit: Payer: Self-pay | Admitting: Family Medicine

## 2022-01-26 ENCOUNTER — Telehealth: Payer: Self-pay | Admitting: Family Medicine

## 2022-01-26 NOTE — Telephone Encounter (Signed)
Pt is completely out of rx. Please advise.  ? ?Medication: levothyroxine (SYNTHROID) 50 MCG tablet  ? ?Has the patient contacted their pharmacy? Yes.   ? ? ?Preferred Pharmacy: [facility] [address] - [place], [place] - [address] [place] [address]  ?[address], [address], [place] [address]  ?Phone:  [phone]  Fax:  [phone]  ? ? ?

## 2022-01-26 NOTE — Telephone Encounter (Signed)
Refill sent in

## 2022-02-08 ENCOUNTER — Ambulatory Visit: Payer: Medicare Other | Admitting: Cardiology

## 2022-03-30 IMAGING — CT CT ABD-PELV W/ CM
2 of 5 series · 14 of 46 positions shown, 16 images · IV contrast (Omnipaque)
Comparison: None.

CLINICAL DATA: Abdominal pain

EXAM:
CT ABDOMEN AND PELVIS WITH CONTRAST
TECHNIQUE: Multidetector CT imaging of the abdomen and pelvis was performed
using the standard protocol following bolus administration of
intravenous contrast.
CONTRAST:  100mL OMNIPAQUE IOHEXOL 300 MG/ML  SOLN

[Series 2: axial st · axial · 0.83mm/px · z∈[-412,-36]mm · 11 of 85 slices shown, 13 images]
[im 5/85  soft-tissue]
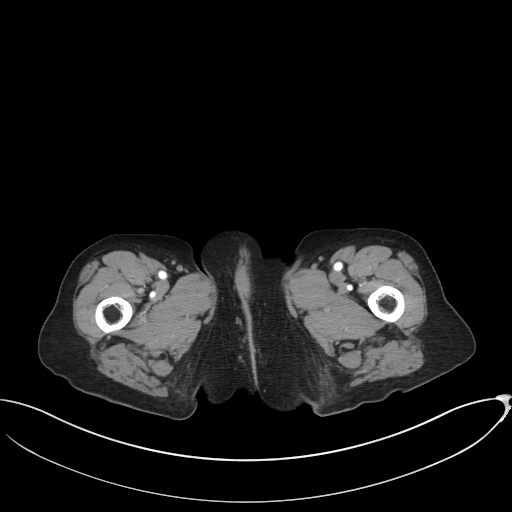
[im 5/85  bone]
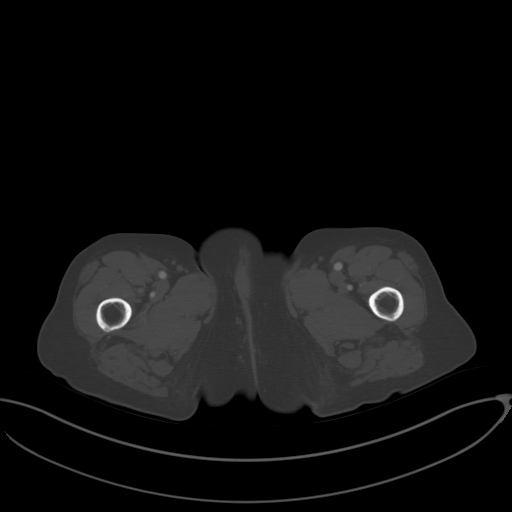
[im 14/85  soft-tissue]
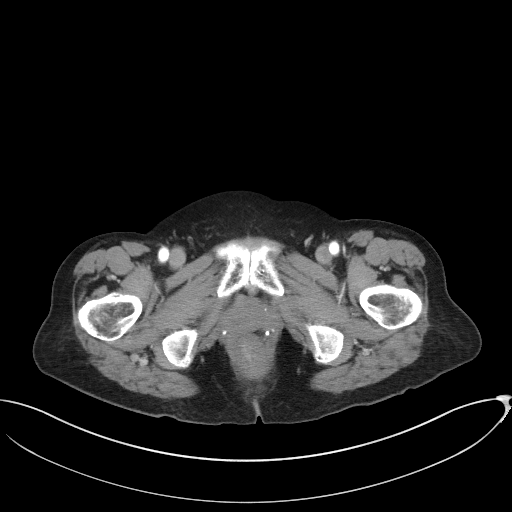
[im 23/85  soft-tissue]
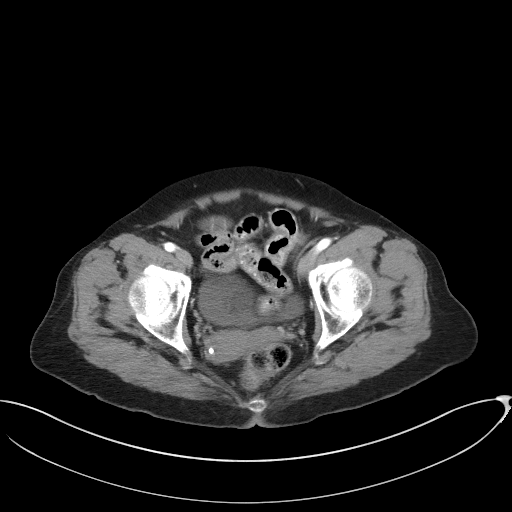
[im 27/85  soft-tissue]
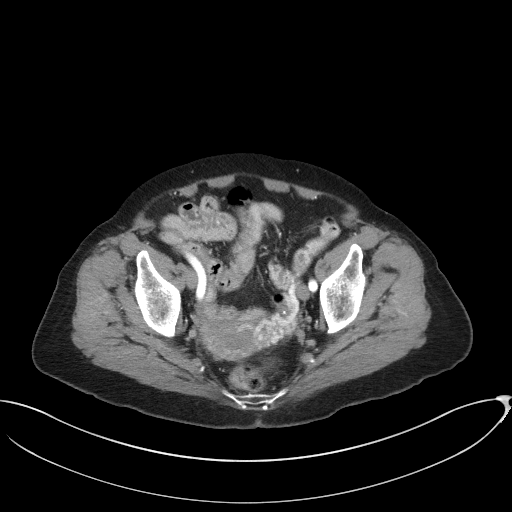
[im 36/85  soft-tissue]
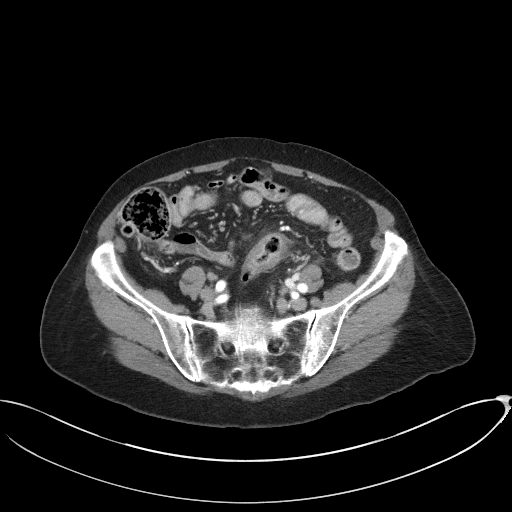
[im 45/85  soft-tissue]
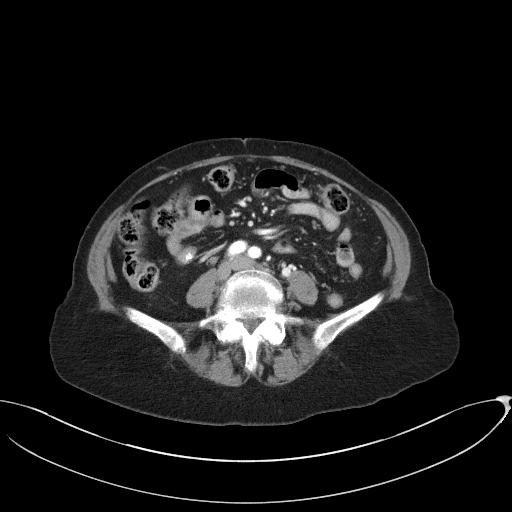
[im 49/85  soft-tissue]
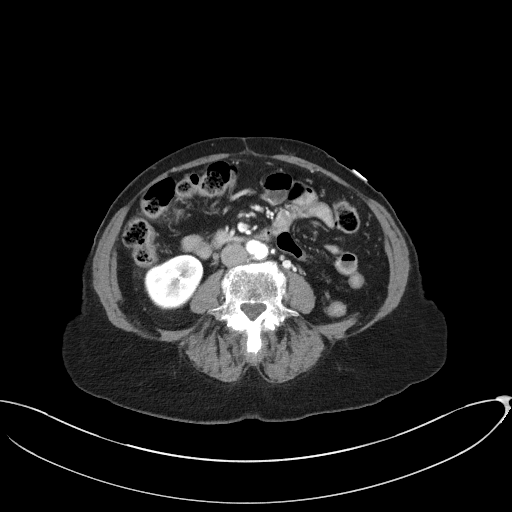
[im 58/85  soft-tissue]
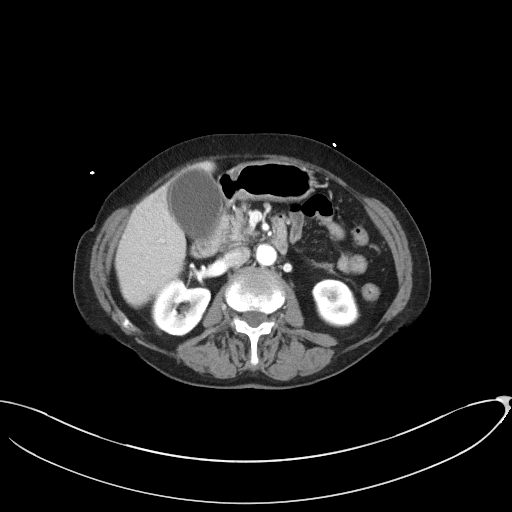
[im 62/85  soft-tissue]
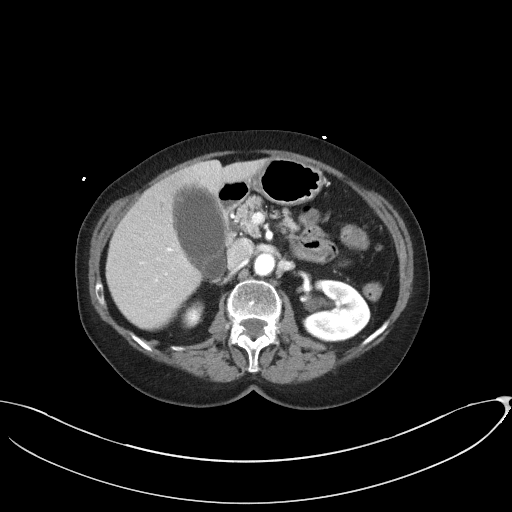
[im 62/85  bone]
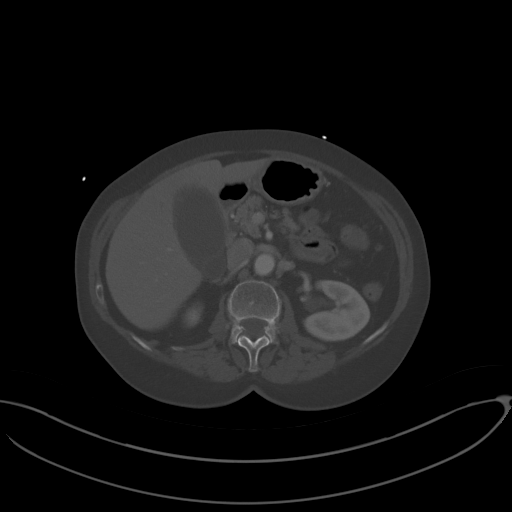
[im 71/85  soft-tissue]
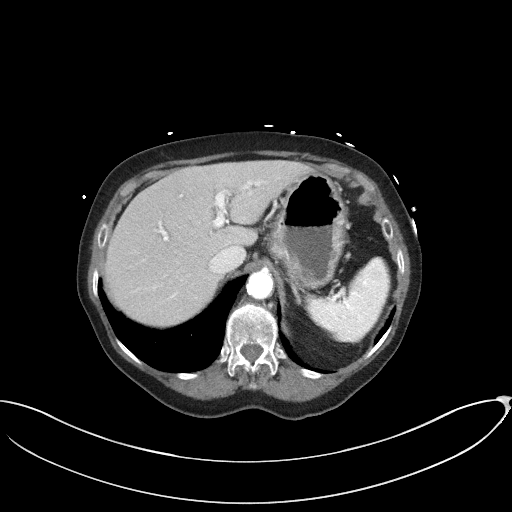
[im 80/85  soft-tissue]
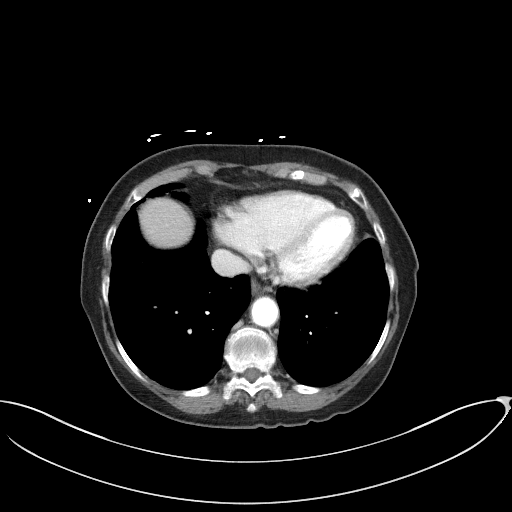

[Series 5: coronal st · coronal · 0.66mm/px · 3 of 84 slices shown]
[im 28/84  soft-tissue]
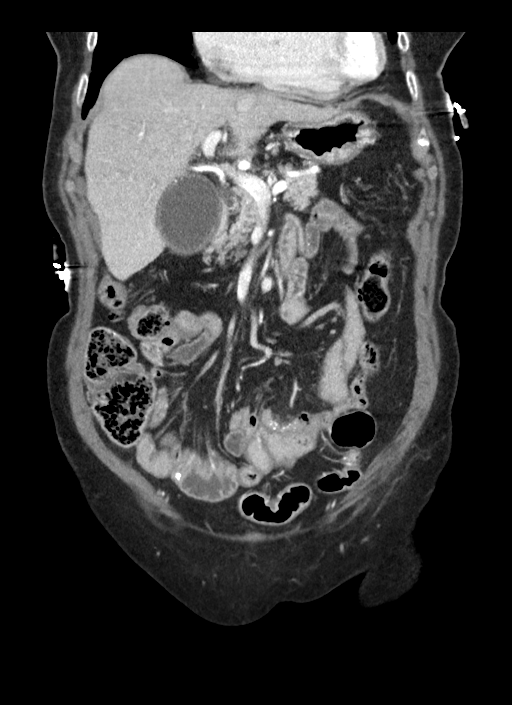
[im 37/84  soft-tissue]
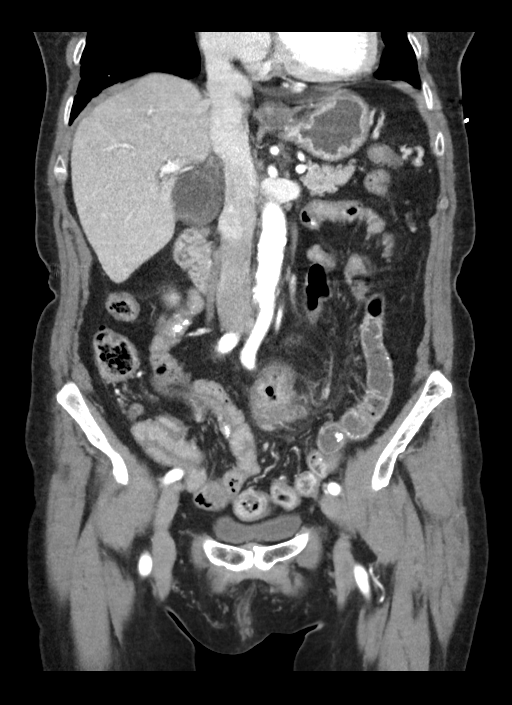
[im 47/84  soft-tissue]
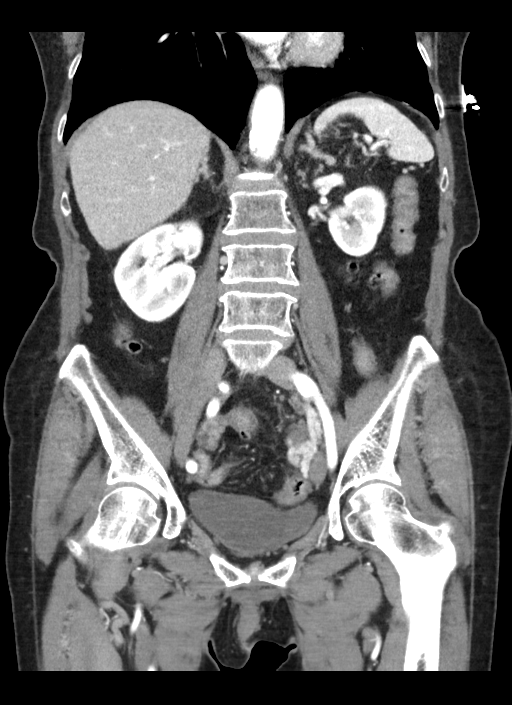

[14 of 46 positions shown; findings below may reference images not displayed]

FINDINGS: Lower chest: There is mild atelectatic change in the anterior left
base. Lung bases otherwise are clear.

Hepatobiliary: There is a degree of hepatic steatosis. No focal
liver lesions are evident.

The gallbladder appears distended with suggestion of pericholecystic
fluid. There is an area of increased attenuation in the inferior
aspect of the gallbladder measuring 3.1 x 2.8 cm. Question mass
within the gallbladder versus accumulation of sludge. There is
borderline intrahepatic biliary duct dilatation. There is no common
hepatic or common bile duct dilatation. No obstructing lesions seen
in the biliary ductal system.

Pancreas: No appreciable pancreatic mass or inflammatory focus.

Spleen: No splenic lesions are evident.

Adrenals/Urinary Tract: Adrenals bilaterally appear unremarkable.
There is a 5 mm probable cyst in the medial upper to mid right
kidney. No evident hydronephrosis on either side. There is no
appreciable renal or ureteral calculus on either side. Urinary
bladder is midline with wall thickness within normal limits.

Stomach/Bowel: There are scattered sigmoid diverticula which do not
appear overtly inflamed. There is, however, mild wall thickening in
the mid sigmoid colon with subtle mesenteric thickening in this
area. No similar areas of apparent bowel inflammation noted
elsewhere in the abdomen or pelvis. No evident bowel obstruction.
The terminal ileum appears unremarkable. There is no evident free
air or portal venous air.

Vascular/Lymphatic: No abdominal aortic aneurysm. There is aortic
atherosclerosis. Major venous structures appear patent. There is no
evident adenopathy in the abdomen or pelvis.

Reproductive: Uterus appears postmenopausal. There is extensive
periuterine vascular calcification. No pelvic mass evident.

Other: Appendix appears normal. No abscess or ascites evident in the
abdomen or pelvis.

Musculoskeletal: There is slight anterolisthesis of L4 on L5. No
pars defects demonstrable on this study. There is degenerative
change in the mid to lower lumbar spine. There are no appreciable
blastic or lytic bone lesions. There is no intramuscular or
abdominal wall lesion.
IMPRESSION: 1. Gallbladder appears distended with suggestion of pericholecystic
fluid. Question mass versus accumulated sludge in the inferior
gallbladder. Question a degree of acute cholecystitis. Advise
correlation with ultrasound of the gallbladder to further evaluate.
Slight intrahepatic biliary duct dilatation may be a consequence of
the changes is in the gallbladder.

2. Colitis versus early diverticulitis in the mid sigmoid colon. No
bowel inflammation elsewhere noted. No bowel obstruction. No abscess
in the abdomen or pelvis. Appendix appears normal.

3. Extensive periuterine vascular calcification. Question pelvic
congestion syndrome.

4.  Hepatic steatosis.

5.  Aortic Atherosclerosis ([id number]).

## 2022-03-31 IMAGING — NM NM HEPATOBILIARY IMAGE, INC GB
2 series · 12 of 12 positions shown · non-contrast
Comparison: Ultrasound abdomen 04/09/2020

CLINICAL DATA: Abdominal and chest pain for 3 weeks, severe for 3
days, nausea, vomiting; abnormal ultrasound abdomen with gallstones,
gallbladder wall thickening, and distended gallbladder

EXAM:
NUCLEAR MEDICINE HEPATOBILIARY IMAGING
TECHNIQUE: Sequential images of the abdomen were obtained [DATE] minutes
following intravenous administration of radiopharmaceutical.
RADIOPHARMACEUTICALS:  5.0 mCi 5c-EEm  Choletec IV

[Series 1: biliary · 3.25mm/px · 6 of 60 frames shown]
[frame 6/60]
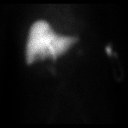
[frame 16/60]
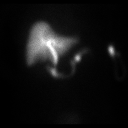
[frame 26/60]
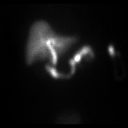
[frame 36/60]
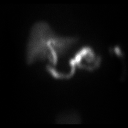
[frame 46/60]
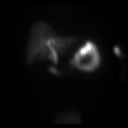
[frame 56/60]
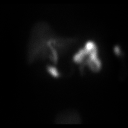

[Series 3: anterior · 3.25mm/px · 6 of 30 frames shown]
[frame 3/30]
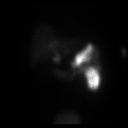
[frame 8/30]
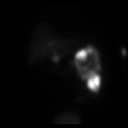
[frame 13/30]
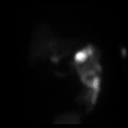
[frame 18/30]
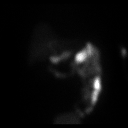
[frame 23/30]
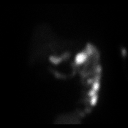
[frame 28/30]
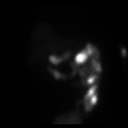

[12 of 12 positions shown; findings below may reference images not displayed]

FINDINGS: Prompt tracer extraction from bloodstream indicating normal
hepatocellular function.

Prompt excretion of tracer into biliary tree.

Duodenum visualized at 9 minutes.

At 1 hour, gallbladder had not visualized.

Patient received 2 mg of morphine IV and imaging was continued for
30 minutes.

Gallbladder failed to visualize following morphine augmentation.

Findings are consistent with cystic duct obstruction and acute
cholecystitis.
IMPRESSION: Patent CBD.

Nonvisualization of the gallbladder despite morphine administration
consistent with cystic duct obstruction and acute cholecystitis.

## 2022-04-22 DIAGNOSIS — L820 Inflamed seborrheic keratosis: Secondary | ICD-10-CM | POA: Diagnosis not present

## 2022-04-22 DIAGNOSIS — L814 Other melanin hyperpigmentation: Secondary | ICD-10-CM | POA: Diagnosis not present

## 2022-04-22 DIAGNOSIS — L578 Other skin changes due to chronic exposure to nonionizing radiation: Secondary | ICD-10-CM | POA: Diagnosis not present

## 2022-04-22 DIAGNOSIS — L853 Xerosis cutis: Secondary | ICD-10-CM | POA: Diagnosis not present

## 2022-04-22 DIAGNOSIS — L219 Seborrheic dermatitis, unspecified: Secondary | ICD-10-CM | POA: Diagnosis not present

## 2022-06-24 ENCOUNTER — Ambulatory Visit: Payer: Medicare Other | Admitting: Family Medicine

## 2022-07-13 ENCOUNTER — Ambulatory Visit (INDEPENDENT_AMBULATORY_CARE_PROVIDER_SITE_OTHER): Payer: Medicare Other | Admitting: Family Medicine

## 2022-07-13 ENCOUNTER — Ambulatory Visit (HOSPITAL_BASED_OUTPATIENT_CLINIC_OR_DEPARTMENT_OTHER)
Admission: RE | Admit: 2022-07-13 | Discharge: 2022-07-13 | Disposition: A | Discharge status: Home / Self Care | Payer: Medicare Other | Source: Ambulatory Visit | Attending: Family Medicine | Admitting: Family Medicine

## 2022-07-13 VITALS — BP 122/55 | HR 31 | Temp 98.0°F | Resp 16 | Ht 59.0 in | Wt 112.8 lb

## 2022-07-13 DIAGNOSIS — R1310 Dysphagia, unspecified: Secondary | ICD-10-CM | POA: Diagnosis not present

## 2022-07-13 DIAGNOSIS — M810 Age-related osteoporosis without current pathological fracture: Secondary | ICD-10-CM | POA: Diagnosis not present

## 2022-07-13 DIAGNOSIS — M546 Pain in thoracic spine: Secondary | ICD-10-CM | POA: Diagnosis not present

## 2022-07-13 DIAGNOSIS — R739 Hyperglycemia, unspecified: Secondary | ICD-10-CM

## 2022-07-13 DIAGNOSIS — K219 Gastro-esophageal reflux disease without esophagitis: Secondary | ICD-10-CM | POA: Diagnosis not present

## 2022-07-13 DIAGNOSIS — R079 Chest pain, unspecified: Secondary | ICD-10-CM

## 2022-07-13 DIAGNOSIS — E785 Hyperlipidemia, unspecified: Secondary | ICD-10-CM

## 2022-07-13 DIAGNOSIS — K449 Diaphragmatic hernia without obstruction or gangrene: Secondary | ICD-10-CM | POA: Diagnosis not present

## 2022-07-13 DIAGNOSIS — E039 Hypothyroidism, unspecified: Secondary | ICD-10-CM | POA: Diagnosis not present

## 2022-07-13 NOTE — Assessment & Plan Note (Signed)
None at visit and EKG stable. Has been a year since she saw cardiology so new referral is placed and she is advised to seek care if pain returns and does not resolve.

## 2022-07-13 NOTE — Assessment & Plan Note (Signed)
She is now describing some dysphagia with her food catching and causing pain and even to the point of having to vomit to dislodge it at times. Needs small bites with sips of water and referred to GI for evaluation and consideration of esophageal dilatation

## 2022-07-13 NOTE — Assessment & Plan Note (Signed)
She stays active but declines meds. Due to recent thoracic back pain will repeat Dexa scan and check an xray of thoracic spine

## 2022-07-13 NOTE — Progress Notes (Signed)
Subjective:   By signing my name below, I, Kellie Simmering, attest that this documentation has been prepared under the direction and in the presence of Mosie Lukes, MD 07/13/2022.     Patient ID: Cynthia Young, female    DOB: 31-Oct-1930, 88 y.o.   MRN: [id number]  Chief Complaint  Patient presents with   Follow-up    Here for 6 Month Follow Up and  Complains of chest pain on left side with back pain   HPI Patient is in today for an office visit.  Difficulty swallowing: She states that she has difficulty swallowing her food and suspects a hiatal hernia. She denies choking.   Chest discomfort: She reports having left sided pain that started on 08/27. She describes the pain as a pressure and as a 6-7/10  She states that the pain lasted the entire day but it does come and go. She denies having shortness of breath and sweating.   Back pain: She complains of back pain. She says that she does not want to take medications to manage her bone integrity.    Leg cramps: She says that she experiences muscle cramps in her legs every night.   Immunizations: She is up to date on her pneumonia immunizations. She has been informed about receiving COVID-19, high-dose Flu, and RSV vaccinations.   Past Medical History:  Diagnosis Date   Arthritis    Hyperglycemia 09/04/2020   Hyperlipidemia    Hypothyroid    Past Surgical History:  Procedure Laterality Date   BACK SURGERY     BIOPSY  04/12/2020   Procedure: BIOPSY;  Surgeon: Rush Landmark Telford Nab., MD;  Location: [facility];  Service: Gastroenterology;;   Lillard Anes Left    CHOLECYSTECTOMY N/A 04/11/2020   Procedure: LAPAROSCOPIC CHOLECYSTECTOMY WITH INTRAOPERATIVE CHOLANGIOGRAM;  Surgeon: Alphonsa Overall, MD;  Location: [facility];  Service: General;  Laterality: N/A;   ERCP N/A 04/12/2020   Procedure: ENDOSCOPIC RETROGRADE CHOLANGIOPANCREATOGRAPHY (ERCP);  Surgeon: Irving Copas., MD;  Location: [facility];  Service:  Gastroenterology;  Laterality: N/A;   FOOT SURGERY Right    bunion   REMOVAL OF STONES  04/12/2020   Procedure: REMOVAL OF STONES;  Surgeon: Rush Landmark Telford Nab., MD;  Location: [facility];  Service: Gastroenterology;;   [name]  04/12/2020   Procedure: [name];  Surgeon: Mansouraty, Telford Nab., MD;  Location: [facility];  Service: Gastroenterology;;   Family History  Problem Relation Age of Onset   Cancer Mother    Heart disease Father    Arthritis Father        rheumatoid   Cancer Brother        colon   Colon cancer Brother    Esophageal cancer Neg Hx    Inflammatory bowel disease Neg Hx    Liver disease Neg Hx    Pancreatic cancer Neg Hx    Stomach cancer Neg Hx    Social History   Socioeconomic History   Marital status: Widowed    Spouse name: Not on file   Number of children: Not on file   Years of education: Not on file   Highest education level: Not on file  Occupational History   Not on file  Tobacco Use   Smoking status: Never   Smokeless tobacco: Never  Vaping Use   Vaping Use: Never used  Substance and Sexual Activity   Alcohol use: Yes    Comment: socially   Drug use: Never   Sexual activity: Not Currently  Other Topics Concern  Not on file  Social History Narrative   worked in [employer], short time. No cigarettes or drug use. Live by self no dietary restictions and walks daily   Social Determinants of Health   Financial Resource Strain: Low Risk  (04/30/2021)   Overall Financial Resource Strain (CARDIA)    Difficulty of Paying Living Expenses: Not very hard  Food Insecurity: No Food Insecurity (08/05/2021)   Hunger Vital Sign    Worried About Running Out of Food in the Last Year: Never true    Ran Out of Food in the Last Year: Never true  Transportation Needs: No Transportation Needs (08/05/2021)   PRAPARE - Hydrologist (Medical): No    Lack of Transportation (Non-Medical): No  Physical  Activity: Inactive (08/05/2021)   Exercise Vital Sign    Days of Exercise per Week: 0 days    Minutes of Exercise per Session: 0 min  Stress: No Stress Concern Present (08/05/2021)   [place]    Feeling of Stress : Not at all  Social Connections: Socially Isolated (08/05/2021)   Social Connection and Isolation Panel [NHANES]    Frequency of Communication with Friends and Family: More than three times a week    Frequency of Social Gatherings with Friends and Family: Once a week    Attends Religious Services: Never    Marine scientist or Organizations: No    Attends Archivist Meetings: Never    Marital Status: Widowed  Intimate Partner Violence: Not At Risk (08/05/2021)   Humiliation, Afraid, Rape, and Kick questionnaire    Fear of Current or Ex-Partner: No    Emotionally Abused: No    Physically Abused: No    Sexually Abused: No   Outpatient Medications Prior to Visit  Medication Sig Dispense Refill   levothyroxine (SYNTHROID) 50 MCG tablet TAKE ONE TABLET BY MOUTH ONE TIME DAILY 90 tablet 1   No facility-administered medications prior to visit.   No Known Allergies  Review of Systems  Cardiovascular:        (+) Chest discomfort.  Musculoskeletal:  Positive for back pain.      Objective:    Physical Exam Constitutional:      General: She is not in acute distress.    Appearance: Normal appearance. She is not ill-appearing.  HENT:     Head: Normocephalic and atraumatic.     Right Ear: External ear normal.     Left Ear: External ear normal.     Mouth/Throat:     Mouth: Mucous membranes are moist.     Pharynx: Oropharynx is clear.  Eyes:     Extraocular Movements: Extraocular movements intact.     Pupils: Pupils are equal, round, and reactive to light.  Cardiovascular:     Rate and Rhythm: Normal rate and regular rhythm.     Pulses: Normal pulses.     Heart sounds: Normal heart sounds.  No murmur heard.    No gallop.  Pulmonary:     Effort: Pulmonary effort is normal. No respiratory distress.     Breath sounds: Normal breath sounds. No wheezing or rales.  Abdominal:     General: Bowel sounds are normal.  Musculoskeletal:        General: No deformity.     Right shoulder: No bony tenderness.     Left shoulder: No bony tenderness.  Skin:    General: Skin is warm and dry.  Neurological:     Mental Status: She is alert and oriented to person, place, and time.  Psychiatric:        Mood and Affect: Mood normal.        Behavior: Behavior normal.        Judgment: Judgment normal.    BP (!) 122/55 (BP Location: Right Arm, Patient Position: Sitting, Cuff Size: Normal)   Pulse (!) 31   Temp 98 F (36.7 C) (Oral)   Resp 16   Ht '4\' 11"'$  (1.499 m)   Wt 112 lb 12.8 oz (51.2 kg)   SpO2 96%   BMI 22.78 kg/m  Wt Readings from Last 3 Encounters:  07/13/22 112 lb 12.8 oz (51.2 kg)  12/17/21 113 lb (51.3 kg)  08/05/21 112 lb (50.8 kg)   Diabetic Foot Exam - Simple   No data filed    Lab Results  Component Value Date   WBC 6.7 12/17/2021   HGB 14.1 12/17/2021   HCT 44.2 12/17/2021   PLT 248.0 12/17/2021   GLUCOSE 85 12/21/2021   CHOL 211 (H) 12/17/2021   TRIG 99.0 12/17/2021   HDL 65.60 12/17/2021   LDLCALC 125 (H) 12/17/2021   ALT 9 12/21/2021   AST 15 12/21/2021   NA 142 12/21/2021   K 4.6 12/21/2021   CL 104 12/21/2021   CREATININE 0.89 12/21/2021   BUN 20 12/21/2021   CO2 30 12/21/2021   TSH 2.12 12/17/2021   HGBA1C 6.0 12/17/2021   Lab Results  Component Value Date   TSH 2.12 12/17/2021   Lab Results  Component Value Date   WBC 6.7 12/17/2021   HGB 14.1 12/17/2021   HCT 44.2 12/17/2021   MCV 93.4 12/17/2021   PLT 248.0 12/17/2021   Lab Results  Component Value Date   NA 142 12/21/2021   K 4.6 12/21/2021   CO2 30 12/21/2021   GLUCOSE 85 12/21/2021   BUN 20 12/21/2021   CREATININE 0.89 12/21/2021   BILITOT 0.4 12/21/2021   ALKPHOS 58  12/21/2021   AST 15 12/21/2021   ALT 9 12/21/2021   PROT 6.7 12/21/2021   ALBUMIN 4.3 12/21/2021   CALCIUM 9.7 12/21/2021   ANIONGAP 10 01/14/2021   GFR 57.28 (L) 12/21/2021   Lab Results  Component Value Date   CHOL 211 (H) 12/17/2021   Lab Results  Component Value Date   HDL 65.60 12/17/2021   Lab Results  Component Value Date   LDLCALC 125 (H) 12/17/2021   Lab Results  Component Value Date   TRIG 99.0 12/17/2021   Lab Results  Component Value Date   CHOLHDL 3 12/17/2021   Lab Results  Component Value Date   HGBA1C 6.0 12/17/2021      Assessment & Plan:   Problem List Items Addressed This Visit     Hypothyroid    On Levothyroxine, continue to monitor      Relevant Orders   TSH   Hyperlipidemia    Encourage heart healthy diet such as MIND or DASH diet, increase exercise, avoid trans fats, simple carbohydrates and processed foods, consider a krill or fish or flaxseed oil cap daily.       Relevant Orders   Lipid panel   Hiatal hernia with GERD    She is now describing some dysphagia with her food catching and causing pain and even to the point of having to vomit to dislodge it at times. Needs small bites with sips of water and referred to GI for  evaluation and consideration of esophageal dilatation      Hyperglycemia   Relevant Orders   DG Bone Density   Comprehensive metabolic panel   Hemoglobin A1c   Osteoporosis    She stays active but declines meds. Due to recent thoracic back pain will repeat Dexa scan and check an xray of thoracic spine      Relevant Orders   DG Bone Density   VITAMIN D 25 Hydroxy (Vit-D Deficiency, Fractures)   Chest pain - Primary    None at visit and EKG stable. Has been a year since she saw cardiology so new referral is placed and she is advised to seek care if pain returns and does not resolve.       Relevant Orders   EKG 12-Lead (Completed)   DG Bone Density   Ambulatory referral to Cardiology   CBC   Other Visit  Diagnoses     Dysphagia, unspecified type       Relevant Orders   DG Bone Density   Ambulatory referral to Gastroenterology   Thoracic back pain, unspecified back pain laterality, unspecified chronicity       Relevant Orders   DG Thoracic Spine 2 View      No orders of the defined types were placed in this encounter.  I, Penni Homans, MD, personally preformed the services described in this documentation.  All medical record entries made by the scribe were at my direction and in my presence.  I have reviewed the chart and discharge instructions (if applicable) and agree that the record reflects my personal performance and is accurate and complete. 07/13/2022  I,Mohammed Iqbal,acting as a scribe for Penni Homans, MD.,have documented all relevant documentation on the behalf of Penni Homans, MD,as directed by  Penni Homans, MD while in the presence of Penni Homans, MD.  Penni Homans, MD

## 2022-07-13 NOTE — Assessment & Plan Note (Signed)
On Levothyroxine, continue to monitor 

## 2022-07-13 NOTE — Assessment & Plan Note (Signed)
Encourage heart healthy diet such as MIND or DASH diet, increase exercise, avoid trans fats, simple carbohydrates and processed foods, consider a krill or fish or flaxseed oil cap daily.  °

## 2022-07-13 NOTE — Patient Instructions (Addendum)
RSV/ Respiratory Syncitial Virus vaccine once at pharmacy  If you want COVID booster wait for new shot in October  Osteoporosis  Osteoporosis happens when the bones become thin and less dense than normal. Osteoporosis makes bones more brittle and fragile and more likely to break (fracture). Over time, osteoporosis can cause your bones to become so weak that they fracture after a minor fall. Bones in the hip, wrist, and spine are most likely to fracture due to osteoporosis. What are the causes? The exact cause of this condition is not known. What increases the risk? You are more likely to develop this condition if you: Have family members with this condition. Have poor nutrition. Use the following: Steroid medicines, such as prednisone. Anti-seizure medicines. Nicotine or tobacco, such as cigarettes, e-cigarettes, and chewing tobacco. Are female. Are age 86 or older. Are not physically active (are sedentary). Are of European or Asian descent. Have a small body frame. What are the signs or symptoms? A fracture might be the first sign of osteoporosis, especially if the fracture results from a fall or injury that usually would not cause a bone to break. Other signs and symptoms include: Pain in the neck or low back. Stooped posture. Loss of height. How is this diagnosed? This condition may be diagnosed based on: Your medical history. A physical exam. A bone mineral density test, also called a DXA or DEXA test (dual-energy X-ray absorptiometry test). This test uses X-rays to measure the amount of minerals in your bones. How is this treated? This condition may be treated by: Making lifestyle changes, such as: Including foods with more calcium and vitamin D in your diet. Doing weight-bearing and muscle-strengthening exercises. Stopping tobacco use. Limiting alcohol intake. Taking medicine to slow the process of bone loss or to increase bone density. Taking daily supplements of  calcium and vitamin D. Taking hormone replacement medicines, such as estrogen for women and testosterone for men. Monitoring your levels of calcium and vitamin D. The goal of treatment is to strengthen your bones and lower your risk for a fracture. Follow these instructions at home: Eating and drinking Include calcium and vitamin D in your diet. Calcium is important for bone health, and vitamin D helps your body absorb calcium. Good sources of calcium and vitamin D include: Certain fatty fish, such as salmon and tuna. Products that have calcium and vitamin D added to them (are fortified), such as fortified cereals. Egg yolks. Cheese. Liver.  Activity Do exercises as told by your health care provider. Ask your health care provider what exercises and activities are safe for you. You should do: Exercises that make you work against gravity (weight-bearing exercises), such as tai chi, yoga, or walking. Exercises to strengthen muscles, such as lifting weights. Lifestyle Do not drink alcohol if: Your health care provider tells you not to drink. You are pregnant, may be pregnant, or are planning to become pregnant. If you drink alcohol: Limit how much you use to: 0-1 drink a day for women. 0-2 drinks a day for men. Know how much alcohol is in your drink. In the [place] one drink equals one 12 oz bottle of beer (355 mL), one 5 oz glass of wine (148 mL), or one 1 oz glass of hard liquor (44 mL). Do not use any products that contain nicotine or tobacco, such as cigarettes, e-cigarettes, and chewing tobacco. If you need help quitting, ask your health care provider. Preventing falls Use devices to help you move around (mobility aids) as  needed, such as canes, walkers, scooters, or crutches. Keep rooms well-lit and clutter-free. Remove tripping hazards from walkways, including cords and throw rugs. Install grab bars in bathrooms and safety rails on stairs. Use rubber mats in the bathroom and other  areas that are often wet or slippery. Wear closed-toe shoes that fit well and support your feet. Wear shoes that have rubber soles or low heels. Review your medicines with your health care provider. Some medicines can cause dizziness or changes in blood pressure, which can increase your risk of falling. General instructions Take over-the-counter and prescription medicines only as told by your health care provider. Keep all follow-up visits. This is important. Contact a health care provider if: You have never been screened for osteoporosis and you are: A woman who is age 86 or older. A man who is age 15 or older. Get help right away if: You fall or injure yourself. Summary Osteoporosis is thinning and loss of density in your bones. This makes bones more brittle and fragile and more likely to break (fracture),even with minor falls. The goal of treatment is to strengthen your bones and lower your risk for a fracture. Include calcium and vitamin D in your diet. Calcium is important for bone health, and vitamin D helps your body absorb calcium. Talk with your health care provider about screening for osteoporosis if you are a woman who is age 86 or older, or a man who is age 86 or older. This information is not intended to replace advice given to you by your health care provider. Make sure you discuss any questions you have with your health care provider. Document Revised: 04/17/2020 Document Reviewed: 04/17/2020 Elsevier Patient Education  [place].

## 2022-07-14 ENCOUNTER — Telehealth: Payer: Self-pay

## 2022-07-14 NOTE — Telephone Encounter (Signed)
done

## 2022-07-14 NOTE — Telephone Encounter (Signed)
Done

## 2022-07-21 ENCOUNTER — Telehealth: Payer: Self-pay | Admitting: Family Medicine

## 2022-07-21 NOTE — Telephone Encounter (Signed)
Pt called asking if the labs and bone density that was ordered on her last visit (8.29.23) was meant for now or when she returns for a visit in three months.

## 2022-07-27 ENCOUNTER — Ambulatory Visit (INDEPENDENT_AMBULATORY_CARE_PROVIDER_SITE_OTHER): Payer: Medicare Other | Admitting: Nurse Practitioner

## 2022-07-27 ENCOUNTER — Encounter: Payer: Self-pay | Admitting: Nurse Practitioner

## 2022-07-27 VITALS — BP 122/78 | HR 65 | Ht 59.0 in | Wt 112.0 lb

## 2022-07-27 DIAGNOSIS — R1010 Upper abdominal pain, unspecified: Secondary | ICD-10-CM

## 2022-07-27 DIAGNOSIS — R1310 Dysphagia, unspecified: Secondary | ICD-10-CM

## 2022-07-27 MED ORDER — FAMOTIDINE 20 MG PO TABS: 20.0000 mg | ORAL_TABLET | Freq: Every day | ORAL | 5 refills | Status: DC

## 2022-07-27 NOTE — Progress Notes (Signed)
Attending Physician's Attestation   I have reviewed the chart.   I agree with the Advanced Practitioner's note, impression, and recommendations with any updates as below.    Quinn Bartling Mansouraty, MD [facility] Office # [phone]  

## 2022-07-27 NOTE — Patient Instructions (Addendum)
Recommend daily Pepcid 20 mg before lunch. Will call prescription to pharmacy, # 30 with 5 refills Avoid anti-inflammatory medication such as advil , aleve, etc Until we can better understand what is going on with your swallowing be sure to take swallowing precautions. Eat slowly, chew food well before swallowing. Drink  liquids in between each bite to avoid food impaction.  You have been scheduled for a Barium Esophogram at [facility] (1st floor of the hospital) on 08-02-2022 at 1130am . Please arrive 30 minutes prior to your appointment for registration. Make certain not to have anything to eat or drink 3 hours prior to your test. If you need to reschedule for any reason, please contact radiology at [phone] to do so. __________________________________________________________________ A barium swallow is an examination that concentrates on views of the esophagus. This tends to be a double contrast exam (barium and two liquids which, when combined, create a gas to distend the wall of the oesophagus) or single contrast (non-ionic iodine based). The study is usually tailored to your symptoms so a good history is essential. Attention is paid during the study to the form, structure and configuration of the esophagus, looking for functional disorders (such as aspiration, dysphagia, achalasia, motility and reflux) EXAMINATION You may be asked to change into a gown, depending on the type of swallow being performed. A radiologist and radiographer will perform the procedure. The radiologist will advise you of the type of contrast selected for your procedure and direct you during the exam. You will be asked to stand, sit or lie in several different positions and to hold a small amount of fluid in your mouth before being asked to swallow while the imaging is performed .In some instances you may be asked to swallow barium coated marshmallows to assess the motility of a solid food bolus. The exam can be  recorded as a digital or video fluoroscopy procedure. POST PROCEDURE It will take 1-2 days for the barium to pass through your system. To facilitate this, it is important, unless otherwise directed, to increase your fluids for the next 24-48hrs and to resume your normal diet.  This test typically takes about 30 minutes to perform. __________________________________________________________________________________  Please call with any questions or concerns.  It was a pleasure to see you today!  Thank you for trusting me with your gastrointestinal care!

## 2022-07-27 NOTE — Progress Notes (Signed)
Chief Complaint: Swallowing problems   Assessment &  Plan   # 88 yo female with new intermittent solid food dysphagia. She had a few episodes about three weeks ago. Doing better since taking swallowing precautions. Rule out stricture, mass unlikely, dysmotility Barium swallow with tablet  In the interim patient advised about swallowing precautions. Eat slowly, chew food well before swallowing. Drink liquids in between each bite to avoid food impaction.    # Post-prandial dyspeptic symptoms. History of PUD but symptoms seem more like dyspepsia. Relieved with Pepcid which she takes as needed. Weight is stable Recommend daily Pepcid 20 mg before lunch. Difficult to take it in am and separate from Synthroid No NSAIDs Call if not improving and we can discuss preceding with EGD as she does have a history of PUD. If pain doesn't resolve will check labs as well   HPI   Lili Harts is a 88 y.o. female known to Dr. Rush Landmark with a past medical history significant for hypothyroidism, status post cholecystectomy (status post ERCP for choledocholithiasis), peptic ulcer disease,  GERD. See PMH /PSH for additional history   Caty was last seen here by Dr. Rush Landmark in 2021.  She has a history of peptic ulcer disease.  At the time of her 09/19/2020 visit she was having pyrosis.  We recommended she start omeprazole 20 mg daily or at least Pepcid 20 mg daily.  Repeat EGD was recommended to ensure healing of gastric ulcer.  Repeat EGD wasn't pursued. Screening colonoscopy was performed deferred by patient  Ainara was seen by her PCP 07/13/2022.  She reported having difficulty swallowing her food she was also having intermittent left-sided chest.  EKG was stable.  She was established with cardiology and new referral placed in case chest pain recurred.  She was referred to GI   Interval History:  Three weeks ago she had problems swallowing salmon. Another time she had difficulty swallowing pasta. She  forced herself to vomit and food came up. Since then she has been trying to chew food well. She is trying to avoid meat now. The last episode of dysphagia was ~ 3 weeks ago. No odynophagia  She frequently has postprandial upper abdominal discomfort / chest discomfort. Pepcid AC helps.    Labs:     Latest Ref Rng & Units 12/17/2021   10:41 AM 01/14/2021    2:30 AM 01/12/2021   10:09 AM  CBC  WBC 4.0 - 10.5 K/uL 6.7  7.9  7.5   Hemoglobin 12.0 - 15.0 g/dL 14.1  14.4  14.3   Hematocrit 36.0 - 46.0 % 44.2  43.8  43.3   Platelets 150.0 - 400.0 K/uL 248.0  266  251.0        Latest Ref Rng & Units 12/21/2021    1:21 PM 12/17/2021   10:41 AM 09/01/2021   10:36 AM  Hepatic Function  Total Protein 6.0 - 8.3 g/dL 6.7  6.9  6.5   Albumin 3.5 - 5.2 g/dL 4.3  4.3  4.0   AST 0 - 37 U/L 15  15  15    ALT 0 - 35 U/L 9  9  10    Alk Phosphatase 39 - 117 U/L 58  57  54   Total Bilirubin 0.2 - 1.2 mg/dL 0.4  0.6  0.4      Past Medical History:  Diagnosis Date   Arthritis    Hyperglycemia 09/04/2020   Hyperlipidemia    Hypothyroid     Past Surgical  History:  Procedure Laterality Date   BACK SURGERY     BIOPSY  04/12/2020   Procedure: BIOPSY;  Surgeon: Rush Landmark Telford Nab., MD;  Location: [facility];  Service: Gastroenterology;;   Lillard Anes Left    CHOLECYSTECTOMY N/A 04/11/2020   Procedure: LAPAROSCOPIC CHOLECYSTECTOMY WITH INTRAOPERATIVE CHOLANGIOGRAM;  Surgeon: Alphonsa Overall, MD;  Location: [facility];  Service: General;  Laterality: N/A;   ERCP N/A 04/12/2020   Procedure: ENDOSCOPIC RETROGRADE CHOLANGIOPANCREATOGRAPHY (ERCP);  Surgeon: Irving Copas., MD;  Location: [facility];  Service: Gastroenterology;  Laterality: N/A;   FOOT SURGERY Right    bunion   REMOVAL OF STONES  04/12/2020   Procedure: REMOVAL OF STONES;  Surgeon: Rush Landmark Telford Nab., MD;  Location: [facility];  Service: Gastroenterology;;   [name]  04/12/2020   Procedure: [name];  Surgeon:  Mansouraty, Telford Nab., MD;  Location: [facility];  Service: Gastroenterology;;    Current Medications, Allergies, Family History and Social History were reviewed in [facility] record.     Current Outpatient Medications  Medication Sig Dispense Refill   levothyroxine (SYNTHROID) 50 MCG tablet TAKE ONE TABLET BY MOUTH ONE TIME DAILY 90 tablet 1   No current facility-administered medications for this visit.    Review of Systems: No chest pain. No shortness of breath. No urinary complaints.    Physical Exam  Wt Readings from Last 3 Encounters:  07/13/22 112 lb 12.8 oz (51.2 kg)  12/17/21 113 lb (51.3 kg)  08/05/21 112 lb (50.8 kg)    BP 122/78   Pulse 65   Ht 4' 11"  (1.499 m)   Wt 112 lb (50.8 kg)   BMI 22.62 kg/m  Constitutional:  Generally well appearing female in no acute distress. Psychiatric: Pleasant. Normal mood and affect. Behavior is normal. EENT: Pupils normal.  Conjunctivae are normal. No scleral icterus. Neck supple.  Cardiovascular: Normal rate, regular rhythm. No edema Pulmonary/chest: Effort normal and breath sounds normal. No wheezing, rales or rhonchi. Abdominal: Soft, nondistended, nontender. Bowel sounds active throughout. There are no masses palpable. No hepatomegaly. Neurological: Alert and oriented to person place and time. Skin: Skin is warm and dry. No rashes noted.  Tye Savoy, NP  07/27/2022, 8:36 AM  Cc:  Mosie Lukes, MD

## 2022-07-31 ENCOUNTER — Other Ambulatory Visit: Payer: Self-pay | Admitting: Family Medicine

## 2022-08-02 ENCOUNTER — Ambulatory Visit (HOSPITAL_COMMUNITY)
Admission: RE | Admit: 2022-08-02 | Discharge: 2022-08-02 | Disposition: A | Discharge status: Home / Self Care | Payer: Medicare Other | Source: Ambulatory Visit | Attending: Nurse Practitioner | Admitting: Nurse Practitioner

## 2022-08-02 DIAGNOSIS — R1010 Upper abdominal pain, unspecified: Secondary | ICD-10-CM | POA: Insufficient documentation

## 2022-08-02 DIAGNOSIS — R1310 Dysphagia, unspecified: Secondary | ICD-10-CM | POA: Insufficient documentation

## 2022-08-02 DIAGNOSIS — K219 Gastro-esophageal reflux disease without esophagitis: Secondary | ICD-10-CM | POA: Diagnosis not present

## 2022-08-09 NOTE — Progress Notes (Unsigned)
Cardiology Clinic Note   Patient Name: Cynthia Young Date of Encounter: 08/11/2022  Primary Care Provider:  Mosie Lukes, MD Primary Cardiologist:  Evalina Field, MD  Patient Profile    Cynthia Young 88 year old female presents to the clinic today for follow-up evaluation of her hyperlipidemia and chest discomfort.  Past Medical History    Past Medical History:  Diagnosis Date   Arthritis    Hyperglycemia 09/04/2020   Hyperlipidemia    Hypothyroid    Past Surgical History:  Procedure Laterality Date   BACK SURGERY     BIOPSY  04/12/2020   Procedure: BIOPSY;  Surgeon: Rush Landmark Telford Nab., MD;  Location: [facility];  Service: Gastroenterology;;   Lillard Anes Left    CHOLECYSTECTOMY N/A 04/11/2020   Procedure: LAPAROSCOPIC CHOLECYSTECTOMY WITH INTRAOPERATIVE CHOLANGIOGRAM;  Surgeon: Alphonsa Overall, MD;  Location: [facility];  Service: General;  Laterality: N/A;   ERCP N/A 04/12/2020   Procedure: ENDOSCOPIC RETROGRADE CHOLANGIOPANCREATOGRAPHY (ERCP);  Surgeon: Irving Copas., MD;  Location: [facility];  Service: Gastroenterology;  Laterality: N/A;   FOOT SURGERY Right    bunion   REMOVAL OF STONES  04/12/2020   Procedure: REMOVAL OF STONES;  Surgeon: Rush Landmark Telford Nab., MD;  Location: [facility];  Service: Gastroenterology;;   [name]  04/12/2020   Procedure: [name];  Surgeon: Irving Copas., MD;  Location: [facility];  Service: Gastroenterology;;    Allergies  No Known Allergies  History of Present Illness    Cynthia Young has a PMH of symptomatic bradycardia, chest discomfort, hypokalemia, hypocalcemia, hyperglycemia, osteoporosis, mild cognitive impairment, hypothyroidism, GERD, and varicose veins both lower extremities.  Her PMH also includes Mobitz 1 AV block.    She was admitted to the hospital 3/22 with slow heart rates.  After her hospitalization she continued to notice lightheadedness and dizziness.  She wore a  cardiac event monitor which showed 2 episodes of 2-1 AV block as well as Mobitz 1 AV block.  She was seen and evaluated by EP for her symptomatic bradycardia.  On follow-up 08/04/2021 she was seen by Dr. Curt Bears.  She denied palpitations, chest pain, shortness of breath, orthopnea, PND and lower extremity swelling.  She was tolerating her medications well.  She felt well.  She denied fatigue and dizziness.  She continued to drive.  She was able to complete all of her daily activities and was not restricted by shortness of breath or fatigue.  She did note that at times when she would stand she would have brief episodes of dizziness.  It was felt that it would be best to hold off on pacemaker implantation.  She and her daughter expressed understanding.  She presents to the clinic today for follow-up evaluation and states she has occasional episodes of chest discomfort that appear to be related to reflux.  She takes Pepcid and occasionally takes omeprazole.  She notices discomfort in the evening after she will eat a large meal and she lays down in the evening.  We discussed compliance with her antacid medication, and elevating the head of her bed and eating smaller meals.  We reviewed her previous visit with electrophysiology.  We reviewed her Mobitz type I atrioventricular block.  She expressed understanding.  She presents to the clinic today with her daughter.  She continues to stay somewhat physically active doing her shopping and taking care of her house.  She is from just outside in [place].  We will continue her current medication regimen, have her maintain  her physical activity, and plan follow-up in 12 months.  Today she denies chest pain, shortness of breath, lower extremity edema, fatigue, palpitations, melena, hematuria, hemoptysis, diaphoresis, weakness, presyncope, syncope, orthopnea, and PND.   Home Medications    Prior to Admission medications   Medication Sig Start Date End Date Taking?  Authorizing Provider  Biotin 1 MG CAPS Take by mouth.    [provider]  famotidine (PEPCID) 20 MG tablet Take 1 tablet (20 mg total) by mouth daily. 07/27/22   Willia Craze, NP  levothyroxine (SYNTHROID) 50 MCG tablet TAKE ONE TABLET BY MOUTH ONE TIME DAILY 08/02/22   Mosie Lukes, MD  Magnesium 250 MG TABS Take 1 tablet by mouth 4 (four) times daily.    [provider]    Family History    Family History  Problem Relation Age of Onset   Cancer Mother    Heart disease Father    Arthritis Father        rheumatoid   Cancer Brother        colon   Colon cancer Brother    Esophageal cancer Neg Hx    Inflammatory bowel disease Neg Hx    Liver disease Neg Hx    Pancreatic cancer Neg Hx    Stomach cancer Neg Hx    She indicated that her mother is deceased. She indicated that her father is deceased. She indicated that her brother is deceased. She indicated that her maternal grandmother is deceased. She indicated that her maternal grandfather is deceased. She indicated that her paternal grandmother is deceased. She indicated that her paternal grandfather is deceased. She indicated that only one of her two daughters is alive. She indicated that her son is deceased. She indicated that the status of her neg hx is unknown.  Social History    Social History   Socioeconomic History   Marital status: Widowed    Spouse name: Not on file   Number of children: Not on file   Years of education: Not on file   Highest education level: Not on file  Occupational History   Not on file  Tobacco Use   Smoking status: Never   Smokeless tobacco: Never  Vaping Use   Vaping Use: Never used  Substance and Sexual Activity   Alcohol use: Yes    Comment: socially   Drug use: Never   Sexual activity: Not Currently  Other Topics Concern   Not on file  Social History Narrative   worked in [employer] office, short time. No cigarettes or drug use. Live by self no dietary restictions  and walks daily   Social Determinants of Health   Financial Resource Strain: Low Risk  (04/30/2021)   Overall Financial Resource Strain (CARDIA)    Difficulty of Paying Living Expenses: Not very hard  Food Insecurity: No Food Insecurity (08/05/2021)   Hunger Vital Sign    Worried About Running Out of Food in the Last Year: Never true    Ran Out of Food in the Last Year: Never true  Transportation Needs: No Transportation Needs (08/05/2021)   PRAPARE - Hydrologist (Medical): No    Lack of Transportation (Non-Medical): No  Physical Activity: Inactive (08/05/2021)   Exercise Vital Sign    Days of Exercise per Week: 0 days    Minutes of Exercise per Session: 0 min  Stress: No Stress Concern Present (08/05/2021)   [place]  Feeling of Stress : Not at all  Social Connections: Socially Isolated (08/05/2021)   Social Connection and Isolation Panel [NHANES]    Frequency of Communication with Friends and Family: More than three times a week    Frequency of Social Gatherings with Friends and Family: Once a week    Attends Religious Services: Never    Marine scientist or Organizations: No    Attends Archivist Meetings: Never    Marital Status: Widowed  Intimate Partner Violence: Not At Risk (08/05/2021)   Humiliation, Afraid, Rape, and Kick questionnaire    Fear of Current or Ex-Partner: No    Emotionally Abused: No    Physically Abused: No    Sexually Abused: No     Review of Systems    General:  No chills, fever, night sweats or weight changes.  Cardiovascular:  No chest pain, dyspnea on exertion, edema, orthopnea, palpitations, paroxysmal nocturnal dyspnea. Dermatological: No rash, lesions/masses Respiratory: No cough, dyspnea Urologic: No hematuria, dysuria Abdominal:   No nausea, vomiting, diarrhea, bright red blood per rectum, melena, or hematemesis Neurologic:  No  visual changes, wkns, changes in mental status. All other systems reviewed and are otherwise negative except as noted above.  Physical Exam    VS:  BP (!) 138/56   Pulse 80   Ht '4\' 11"'$  (1.499 m)   Wt 114 lb (51.7 kg)   SpO2 93%   BMI 23.03 kg/m  , BMI Body mass index is 23.03 kg/m. GEN: Well nourished, well developed, in no acute distress. HEENT: normal. Neck: Supple, no JVD, carotid bruits, or masses. Cardiac: RRR, no murmurs, rubs, or gallops. No clubbing, cyanosis, edema.  Radials/DP/PT 2+ and equal bilaterally.  Respiratory:  Respirations regular and unlabored, clear to auscultation bilaterally. GI: Soft, nontender, nondistended, BS + x 4. MS: no deformity or atrophy. Skin: warm and dry, no rash. Neuro:  Strength and sensation are intact. Psych: Normal affect.  Accessory Clinical Findings    Recent Labs: 12/17/2021: Hemoglobin 14.1; Platelets 248.0; TSH 2.12 12/21/2021: ALT 9; BUN 20; Creatinine, Ser 0.89; Potassium 4.6; Sodium 142   Recent Lipid Panel    Component Value Date/Time   CHOL 211 (H) 12/17/2021 1041   TRIG 99.0 12/17/2021 1041   HDL 65.60 12/17/2021 1041   CHOLHDL 3 12/17/2021 1041   VLDL 19.8 12/17/2021 1041   LDLCALC 125 (H) 12/17/2021 1041   LDLCALC 128 (H) 09/04/2020 1125         ECG personally reviewed by me today-none today.  EKG 06/18/2021 sinus rhythm heart rate 38, second-degree AV block type I, Wenckebach,   Echocardiogram 05/23/2020 1. Mobitz II AVB was noted during the study   2. Left ventricular ejection fraction, by estimation, is 65 to 70%. The  left ventricle has normal function. The left ventricle has no regional  wall motion abnormalities. Left ventricular diastolic parameters are  consistent with Grade I diastolic  dysfunction (impaired relaxation).   3. Right ventricular systolic function is normal. The right ventricular  size is normal. There is normal pulmonary artery systolic pressure. The  estimated right ventricular systolic  pressure is 44.0 mmHg.   4. The mitral valve is grossly normal. Trivial mitral valve  regurgitation.   5. The aortic valve is tricuspid. Aortic valve regurgitation is not  visualized.   6. The inferior vena cava is normal in size with greater than 50%  respiratory variability, suggesting right atrial pressure of 3 mmHg.   Assessment & Plan  1.  Mobitz type I Wenckebach atrioventricular block, dizziness-denies episodes of lightheadedness, presyncope or syncope.  Does note occasional episodes of dizziness with standing.  Reports maintaining hydration.  Was seen and evaluated by EP.  Pacemaker implantation was deferred. Heart rate today 80 bpm. Continue to avoid AV nodal blocking agents Continue to monitor  Sinus bradycardia-notes only occasional episodes of dizziness that are brief.  Denies presyncope and syncope.  Avoid AV nodal blocking agents. Maintain physical activity Heart healthy low-sodium diet  Disposition: Follow-up with Dr. Audie Box  in 1 year.   Jossie Ng. Tye Vigo NP-C     08/11/2022, 4:15 PM [facility] [address] Office [phone] Fax [phone]  Notice: This dictation was prepared with Dragon dictation along with smaller phrase technology. Any transcriptional errors that result from this process are unintentional and may not be corrected upon review.  I spent 15 minutes examining this patient, reviewing medications, and using patient centered shared decision making involving her cardiac care.  Prior to her visit I spent greater than 20 minutes reviewing her past medical history,  medications, and prior cardiac tests.

## 2022-08-11 ENCOUNTER — Ambulatory Visit: Payer: Medicare Other | Attending: General Practice | Admitting: General Practice

## 2022-08-11 ENCOUNTER — Encounter: Payer: Self-pay | Admitting: General Practice

## 2022-08-11 ENCOUNTER — Telehealth: Payer: Self-pay | Admitting: Family Medicine

## 2022-08-11 VITALS — BP 138/56 | HR 80 | Ht 59.0 in | Wt 114.0 lb

## 2022-08-11 DIAGNOSIS — R001 Bradycardia, unspecified: Secondary | ICD-10-CM | POA: Insufficient documentation

## 2022-08-11 DIAGNOSIS — I441 Atrioventricular block, second degree: Secondary | ICD-10-CM | POA: Diagnosis not present

## 2022-08-11 NOTE — Patient Instructions (Signed)
Medication Instructions:  The current medical regimen is effective;  continue present plan and medications as directed. Please refer to the Current Medication list given to you today.  *If you need a refill on your cardiac medications before your next appointment, please call your pharmacy*   Lab Work: NONE  Other Instructions INCREASE HYDRATION   Follow-Up: At [facility], you and your health needs are our priority.  As part of our continuing mission to provide you with exceptional heart care, we have created designated Provider Care Teams.  These Care Teams include your primary Cardiologist (physician) and Advanced Practice Providers (APPs -  Physician Assistants and Nurse Practitioners) who all work together to provide you with the care you need, when you need it.  Your next appointment:   12 month(s)  The format for your next appointment:   In Person  Provider:   Evalina Field, MD    Important Information About Sugar

## 2022-08-11 NOTE — Telephone Encounter (Signed)
Left message for patient to call back and schedule Medicare Annual Wellness Visit (AWV).   Please offer to do virtually or by telephone.  Left office number and my jabber #[phone].  Last AWV:08/05/2021  Please schedule at anytime with Nurse Health Advisor.   

## 2022-08-12 ENCOUNTER — Other Ambulatory Visit (HOSPITAL_BASED_OUTPATIENT_CLINIC_OR_DEPARTMENT_OTHER): Payer: Medicare Other

## 2022-08-25 ENCOUNTER — Ambulatory Visit (INDEPENDENT_AMBULATORY_CARE_PROVIDER_SITE_OTHER): Payer: Medicare Other | Admitting: *Deleted

## 2022-08-25 VITALS — BP 140/70 | HR 38 | Ht 59.0 in | Wt 113.2 lb

## 2022-08-25 DIAGNOSIS — Z0000 Encounter for general adult medical examination without abnormal findings: Secondary | ICD-10-CM | POA: Diagnosis not present

## 2022-08-25 NOTE — Progress Notes (Signed)
Subjective:   Cynthia Young is a 88 y.o. female who presents for Medicare Annual (Subsequent) preventive examination.  Review of Systems    Defer to PCP Cardiac Risk Factors include: advanced age (>60mn, >>23women);dyslipidemia     Objective:    Today's Vitals   08/25/22 1352  BP: (!) 140/70  Pulse: (!) 38  Weight: 113 lb 3.2 oz (51.3 kg)  Height: '4\' 11"'$  (1.499 m)   Body mass index is 22.86 kg/m.     08/25/2022    2:00 PM 08/05/2021    9:06 AM 01/14/2021    2:15 AM 07/18/2020    4:25 PM 07/12/2020    1:37 PM 06/09/2020    8:36 AM 04/09/2020    3:20 PM  Advanced Directives  Does Patient Have a Medical Advance Directive? Yes No No No Yes Yes Yes  Type of AParamed[place]of [place] will   Living will;Healthcare Power of [place] will  [place] will  Does patient want to make changes to medical advance directive?      No - Patient declined No - Patient declined  Copy of H[place]emin Chart? No - copy requested      No - copy requested    Current Medications (verified) Outpatient Encounter Medications as of 08/25/2022  Medication Sig   Biotin 1 MG CAPS Take by mouth.   famotidine (PEPCID) 20 MG tablet Take 1 tablet (20 mg total) by mouth daily.   levothyroxine (SYNTHROID) 50 MCG tablet TAKE ONE TABLET BY MOUTH ONE TIME DAILY   Magnesium 250 MG TABS Take 1 tablet by mouth 4 (four) times daily.   No facility-administered encounter medications on file as of 08/25/2022.    Allergies (verified) Patient has no known allergies.   History: Past Medical History:  Diagnosis Date   Arthritis    Hyperglycemia 09/04/2020   Hyperlipidemia    Hypothyroid    Past Surgical History:  Procedure Laterality Date   BACK SURGERY     BIOPSY  04/12/2020   Procedure: BIOPSY;  Surgeon: MRush LandmarkGTelford Nab, MD;  [facility];  Service: Gastroenterology;;   BLillard Anes Left    CHOLECYSTECTOMY N/A 04/11/2020   Procedure: LAPAROSCOPIC CHOLECYSTECTOMY WITH INTRAOPERATIVE CHOLANGIOGRAM;  Surgeon: NAlphonsa Overall MD;  Location: [facility];  Service: General;  Laterality: N/A;   ERCP N/A 04/12/2020   Procedure: ENDOSCOPIC RETROGRADE CHOLANGIOPANCREATOGRAPHY (ERCP);  Surgeon: MIrving Copas, MD;  [facility];  Service: Gastroenterology;  Laterality: N/A;   FOOT SURGERY Right    bunion   REMOVAL OF STONES  04/12/2020   Procedure: REMOVAL OF STONES;  Surgeon: MRush LandmarkGTelford Nab, MD;  [facility];  Service: Gastroenterology;;   SJoan Mayans 04/12/2020   Procedure: SJoan Mayans  Surgeon: Mansouraty, GTelford Nab, MD;  [facility];  Service: Gastroenterology;;   Family History  Problem Relation Age of Onset   Cancer Mother    Heart disease Father    Arthritis Father        rheumatoid   Cancer Brother        colon   Colon cancer Brother    Esophageal cancer Neg Hx    Inflammatory bowel disease Neg Hx    Liver disease Neg Hx    Pancreatic cancer Neg Hx    Stomach cancer Neg Hx    Social History   Socioeconomic History   Marital status: Widowed    Spouse name: Not on file  Number of children: Not on file   Years of education: Not on file   Highest education level: Not on file  Occupational History   Not on file  Tobacco Use   Smoking status: Never   Smokeless tobacco: Never  Vaping Use   Vaping Use: Never used  Substance and Sexual Activity   Alcohol use: Yes    Comment: socially   Drug use: Never   Sexual activity: Not Currently  Other Topics Concern   Not on file  Social History Narrative   worked in [employer], short time. No cigarettes or drug use. Live by self no dietary restictions and walks daily   Social Determinants of Health   Financial Resource Strain: Low Risk  (04/30/2021)   Overall Financial Resource Strain (CARDIA)    Difficulty of Paying Living Expenses: Not very hard  Food  Insecurity: No Food Insecurity (08/05/2021)   Hunger Vital Sign    Worried About Running Out of Food in the Last Year: Never true    Ran Out of Food in the Last Year: Never true  Transportation Needs: No Transportation Needs (08/05/2021)   PRAPARE - Hydrologist (Medical): No    Lack of Transportation (Non-Medical): No  Physical Activity: Inactive (08/05/2021)   Exercise Vital Sign    Days of Exercise per Week: 0 days    Minutes of Exercise per Session: 0 min  Stress: No Stress Concern Present (08/05/2021)   [place]    Feeling of Stress : Not at all  Social Connections: Socially Isolated (08/05/2021)   Social Connection and Isolation Panel [NHANES]    Frequency of Communication with Friends and Family: More than three times a week    Frequency of Social Gatherings with Friends and Family: Once a week    Attends Religious Services: Never    Marine scientist or Organizations: No    Attends Archivist Meetings: Never    Marital Status: Widowed    Tobacco Counseling Counseling given: Not Answered   Clinical Intake:  Pre-visit preparation completed: Yes  Pain : No/denies pain  How often do you need to have someone help you when you read instructions, pamphlets, or other written materials from your doctor or pharmacy?: 1 - Never  Diabetic? No  Activities of Daily Living    08/25/2022    2:02 PM  In your present state of health, do you have any difficulty performing the following activities:  Hearing? 1  Vision? 0  Difficulty concentrating or making decisions? 1  Comment making decisions  Walking or climbing stairs? 1  Dressing or bathing? 0  Doing errands, shopping? 0  Preparing Food and eating ? N  Using the Toilet? N  In the past six months, have you accidently leaked urine? Y  Comment wears pads  Do you have problems with loss of bowel control? N  Managing  your Medications? N  Managing your Finances? N  Housekeeping or managing your Housekeeping? N    Patient Care Team: Mosie Lukes, MD as PCP - General (Family Medicine) O'Neal, Cassie Freer, MD as PCP - Cardiology (Cardiology) Cherre Robins, RPH-CPP (Pharmacist)  Indicate any recent Medical Services you may have received from other than [facility] providers in the past year (date may be approximate).     Assessment:   This is a routine wellness examination for [facility].  Hearing/Vision screen No results found.  Dietary issues and  exercise activities discussed: Current Exercise Habits: The patient does not participate in regular exercise at present, Exercise limited by: None identified   Goals Addressed   None    Depression Screen    08/25/2022    2:02 PM 07/13/2022   10:25 AM 08/05/2021    9:10 AM 06/12/2021   11:03 AM 02/28/2020    3:41 PM 03/30/2018    3:19 PM  PHQ 2/9 Scores  PHQ - 2 Score 0 0 0 0 0 1  PHQ- 9 Score  1   3     Fall Risk    08/25/2022    2:00 PM 07/13/2022   10:25 AM 08/05/2021    9:08 AM 06/12/2021   11:02 AM 04/30/2021    1:40 PM  Fall Risk   Falls in the past year? 0 0 0 1 1  Number falls in past yr: 0 0 0 0 0  Injury with Fall? 0 0 0 0 1  Risk for fall due to : No Fall Risks   History of fall(s) History of fall(s);Impaired mobility;Other (Comment)  Risk for fall due to: Comment     neuropathy  Follow up Falls evaluation completed  Falls prevention discussed Falls evaluation completed Falls evaluation completed;Education provided    FALL RISK PREVENTION PERTAINING TO THE HOME:  Any stairs in or around the home? No  If so, are there any without handrails?  No stairs Home free of loose throw rugs in walkways, pet beds, electrical cords, etc? Yes  Adequate lighting in your home to reduce risk of falls? Yes   ASSISTIVE DEVICES UTILIZED TO PREVENT FALLS:  Life alert? No  Use of a cane, walker or w/c? No  Grab bars in the bathroom? No  Shower chair  or bench in shower? No  Elevated toilet seat or a handicapped toilet? No   TIMED UP AND GO:  Was the test performed? Yes .  Length of time to ambulate 10 feet: 6 sec.   Gait steady and fast without use of assistive device  Cognitive Function:        08/25/2022    2:22 PM  6CIT Screen  What Year? 0 points  What month? 0 points  What time? 0 points  Count back from 20 0 points  Months in reverse 0 points  Repeat phrase 0 points  Total Score 0 points    Immunizations Immunization History  Administered Date(s) Administered   Fluad Quad(high Dose 65+) 09/04/2020, 12/17/2021   Moderna Sars-Covid-2 Vaccination 01/14/2020, 02/22/2020   PNEUMOCOCCAL CONJUGATE-20 06/12/2021     Flu Vaccine status: Due, Education has been provided regarding the importance of this vaccine. Advised may receive this vaccine at local pharmacy or Health Dept. Aware to provide a copy of the vaccination record if obtained from local pharmacy or Health Dept. Verbalized acceptance and understanding.  Pneumococcal vaccine status: Up to date  Covid-19 vaccine status: Information provided on how to obtain vaccines.   Qualifies for Shingles Vaccine? Yes   Zostavax completed No   Shingrix Completed?: No.    Education has been provided regarding the importance of this vaccine. Patient has been advised to call insurance company to determine out of pocket expense if they have not yet received this vaccine. Advised may also receive vaccine at local pharmacy or Health Dept. Verbalized acceptance and understanding.  Screening Tests Health Maintenance  Topic Date Due   INFLUENZA VACCINE  06/15/2022   Pneumonia Vaccine 26+ Years old  Completed   DEXA  SCAN  Completed   HPV VACCINES  Aged Out   TETANUS/TDAP  Discontinued   COVID-19 Vaccine  Discontinued   Zoster Vaccines- Shingrix  Discontinued    Health Maintenance  Health Maintenance Due  Topic Date Due   INFLUENZA VACCINE  06/15/2022    Colorectal  cancer screening: No longer required.   Mammogram status: No longer required due to pt preference.  Bone Density status: Ordered 07/13/22. Pt provided with contact info and advised to call to schedule appt.  Lung Cancer Screening: (Low Dose CT Chest recommended if Age 40-80 years, 30 pack-year currently smoking OR have quit w/in 15years.) does not qualify.   Lung Cancer Screening Referral: N/a  Additional Screening:  Hepatitis C Screening: does not qualify; Completed N/a  Vision Screening: Recommended annual ophthalmology exams for early detection of glaucoma and other disorders of the eye. Is the patient up to date with their annual eye exam?  No  Who is the provider or what is the name of the office in which the patient attends annual eye exams? N/a If pt is not established with a provider, would they like to be referred to a provider to establish care? No .   Dental Screening: Recommended annual dental exams for proper oral hygiene  Community Resource Referral / Chronic Care Management: CRR required this visit?  No   CCM required this visit?  No      Plan:     I have personally reviewed and noted the following in the patient's chart:   Medical and social history Use of alcohol, tobacco or illicit drugs  Current medications and supplements including opioid prescriptions. Patient is not currently taking opioid prescriptions. Functional ability and status Nutritional status Physical activity Advanced directives List of other physicians Hospitalizations, surgeries, and ER visits in previous 12 months Vitals Screenings to include cognitive, depression, and falls Referrals and appointments  In addition, I have reviewed and discussed with patient certain preventive protocols, quality metrics, and best practice recommendations. A written personalized care plan for preventive services as well as general preventive health recommendations were provided to patient.     Beatris Ship, [place]   08/25/2022   Nurse Notes: None

## 2022-08-25 NOTE — Patient Instructions (Signed)
Cynthia Young , Thank you for taking time to come for your Medicare Wellness Visit. I appreciate your ongoing commitment to your health goals. Please review the following plan we discussed and let me know if I can assist you in the future.   These are the goals we discussed:  Goals      Chronic Care Management Pharmacy Care Plan     CARE PLAN ENTRY (see longitudinal plan of care for additional care plan information)  Current Barriers:  Chronic Disease Management support, education, and care coordination needs related to Hypertension, Hyperlipidemia, Pre-Diabetes, Hypothyroidism, GERD   Hypertension Screening BP Readings from Last 3 Encounters:  02/11/21 (!) 122/58  02/02/21 116/72  01/14/21 (!) 170/72  Pharmacist Clinical Goal(s): Over the next 180 days, patient will work with PharmD and providers to maintain BP goal <140/90 while monitoring for hypotension. Currently not taking any blood pressure lowering medications. Current regimen:  Diet and exercise management   Interventions: Consider purchasing blood pressure cuff Check blood pressure 2-3 times per week and record Discussed BP goal Patient self care activities - Over the next 180 days, patient will: Maintain blood pressure less than 140/90  Hyperlipidemia Lab Results  Component Value Date/Time   LDLCALC 110 (H) 01/12/2021 10:09 AM   LDLCALC 128 (H) 09/04/2020 11:25 AM  Pharmacist Clinical Goal(s): Over the next 180 days, patient will work with PharmD and providers to achieve LDL goal < 100 or continue LDL trend downward Current regimen:  Diet and exercise management   Interventions: Discussed diet and exercise Reviewed last lipid panel form 01/12/2021 which showed that LDL was improving Patient self care activities - Over the next 180 days, patient will: Maintain low cholesterol / saturated fat diet  Pre-Diabetes Lab Results  Component Value Date/Time   HGBA1C 6.1 01/12/2021 10:09 AM   HGBA1C 5.9 (H) 09/04/2020  11:25 AM  Pharmacist Clinical Goal(s): Over the next 180 days, patient will work with PharmD and providers to maintain A1c goal <6.5% Current regimen:  Diet and exercise management   Interventions: Discussed diet and exercise Discussed A1c goals Patient self care activities - Over the next 180 days, patient will: Maintain a1c <6.5%  Vitamin D Deficiency / Osteoporosis:   VITD  Date Value Ref Range Status  01/12/2021 33.26 30.00 - 100.00 ng/mL Final    Pharmacist Clinical Goal(s) Over the next 90 days, patient will work with PharmD and providers to achieve vitamin D level within normal limits Current regimen:  None currently Interventions: Recommend vitamin D 2000 IU daily Goal is to get '1200mg'$  of calcium per day from combination of calcium supplement AND diet. Patient self care activities - Over the next 90 days, patient will: Restart vitamin D 2000 IU daily (can purchase over the counter) Start calcium supplement '600mg'$  daily and maintain current intake of calcium rich foods.   Medication management Pharmacist Clinical Goal(s): Over the next 180 days, patient will work with PharmD and providers to maintain optimal medication adherence Current pharmacy: [facility] Interventions Comprehensive medication review performed. Continue current medication management strategy Patient self care activities - Over the next 180 days, patient will: Focus on medication adherence by filling and taking medications appropriately  Take medications as prescribed Report any questions or concerns to PharmD and/or provider(s)  Please see past updates related to this goal by clicking on the "Past Updates" button in the selected goal       Patient Stated     Increase water intake  This is a list of the screening recommended for you and due dates:  Health Maintenance  Topic Date Due   Flu Shot  06/15/2022   Pneumonia Vaccine  Completed   DEXA scan (bone density measurement)  Completed    HPV Vaccine  Aged Out   Tetanus Vaccine  Discontinued   COVID-19 Vaccine  Discontinued   Zoster (Shingles) Vaccine  Discontinued     Next appointment: Follow up in one year for your annual wellness visit.   Preventive Care 86 Years and Older, Female Preventive care refers to lifestyle choices and visits with your health care provider that can promote health and wellness. What does preventive care include? A yearly physical exam. This is also called an annual well check. Dental exams once or twice a year. Routine eye exams. Ask your health care provider how often you should have your eyes checked. Personal lifestyle choices, including: Daily care of your teeth and gums. Regular physical activity. Eating a healthy diet. Avoiding tobacco and drug use. Limiting alcohol use. Practicing safe sex. Taking low-dose aspirin every day. Taking vitamin and mineral supplements as recommended by your health care provider. What happens during an annual well check? The services and screenings done by your health care provider during your annual well check will depend on your age, overall health, lifestyle risk factors, and family history of disease. Counseling  Your health care provider may ask you questions about your: Alcohol use. Tobacco use. Drug use. Emotional well-being. Home and relationship well-being. Sexual activity. Eating habits. History of falls. Memory and ability to understand (cognition). Work and work Statistician. Reproductive health. Screening  You may have the following tests or measurements: Height, weight, and BMI. Blood pressure. Lipid and cholesterol levels. These may be checked every 5 years, or more frequently if you are over 67 years old. Skin check. Lung cancer screening. You may have this screening every year starting at age 86 if you have a 30-pack-year history of smoking and currently smoke or have quit within the past 86 years. Fecal occult blood test  (FOBT) of the stool. You may have this test every year starting at age 86. Flexible sigmoidoscopy or colonoscopy. You may have a sigmoidoscopy every 5 years or a colonoscopy every 10 years starting at age 86. Hepatitis C blood test. Hepatitis B blood test. Sexually transmitted disease (STD) testing. Diabetes screening. This is done by checking your blood sugar (glucose) after you have not eaten for a while (fasting). You may have this done every 1-3 years. Bone density scan. This is done to screen for osteoporosis. You may have this done starting at age 26. Mammogram. This may be done every 1-2 years. Talk to your health care provider about how often you should have regular mammograms. Talk with your health care provider about your test results, treatment options, and if necessary, the need for more tests. Vaccines  Your health care provider may recommend certain vaccines, such as: Influenza vaccine. This is recommended every year. Tetanus, diphtheria, and acellular pertussis (Tdap, Td) vaccine. You may need a Td booster every 10 years. Zoster vaccine. You may need this after age 42. Pneumococcal 13-valent conjugate (PCV13) vaccine. One dose is recommended after age 59. Pneumococcal polysaccharide (PPSV23) vaccine. One dose is recommended after age 58. Talk to your health care provider about which screenings and vaccines you need and how often you need them. This information is not intended to replace advice given to you by your health care provider. Make sure you  discuss any questions you have with your health care provider. Document Released: 11/28/2015 Document Revised: 07/21/2016 Document Reviewed: 09/02/2015 Elsevier Interactive Patient Education  2017 [place] Prevention in the Home Falls can cause injuries. They can happen to people of all ages. There are many things you can do to make your home safe and to help prevent falls. What can I do on the outside of my  home? Regularly fix the edges of walkways and driveways and fix any cracks. Remove anything that might make you trip as you walk through a door, such as a raised step or threshold. Trim any bushes or trees on the path to your home. Use bright outdoor lighting. Clear any walking paths of anything that might make someone trip, such as rocks or tools. Regularly check to see if handrails are loose or broken. Make sure that both sides of any steps have handrails. Any raised decks and porches should have guardrails on the edges. Have any leaves, snow, or ice cleared regularly. Use sand or salt on walking paths during winter. Clean up any spills in your garage right away. This includes oil or grease spills. What can I do in the bathroom? Use night lights. Install grab bars by the toilet and in the tub and shower. Do not use towel bars as grab bars. Use non-skid mats or decals in the tub or shower. If you need to sit down in the shower, use a plastic, non-slip stool. Keep the floor dry. Clean up any water that spills on the floor as soon as it happens. Remove soap buildup in the tub or shower regularly. Attach bath mats securely with double-sided non-slip rug tape. Do not have throw rugs and other things on the floor that can make you trip. What can I do in the bedroom? Use night lights. Make sure that you have a light by your bed that is easy to reach. Do not use any sheets or blankets that are too big for your bed. They should not hang down onto the floor. Have a firm chair that has side arms. You can use this for support while you get dressed. Do not have throw rugs and other things on the floor that can make you trip. What can I do in the kitchen? Clean up any spills right away. Avoid walking on wet floors. Keep items that you use a lot in easy-to-reach places. If you need to reach something above you, use a strong step stool that has a grab bar. Keep electrical cords out of the way. Do  not use floor polish or wax that makes floors slippery. If you must use wax, use non-skid floor wax. Do not have throw rugs and other things on the floor that can make you trip. What can I do with my stairs? Do not leave any items on the stairs. Make sure that there are handrails on both sides of the stairs and use them. Fix handrails that are broken or loose. Make sure that handrails are as long as the stairways. Check any carpeting to make sure that it is firmly attached to the stairs. Fix any carpet that is loose or worn. Avoid having throw rugs at the top or bottom of the stairs. If you do have throw rugs, attach them to the floor with carpet tape. Make sure that you have a light switch at the top of the stairs and the bottom of the stairs. If you do not have them, ask someone to  add them for you. What else can I do to help prevent falls? Wear shoes that: Do not have high heels. Have rubber bottoms. Are comfortable and fit you well. Are closed at the toe. Do not wear sandals. If you use a stepladder: Make sure that it is fully opened. Do not climb a closed stepladder. Make sure that both sides of the stepladder are locked into place. Ask someone to hold it for you, if possible. Clearly mark and make sure that you can see: Any grab bars or handrails. First and last steps. Where the edge of each step is. Use tools that help you move around (mobility aids) if they are needed. These include: Canes. Walkers. Scooters. Crutches. Turn on the lights when you go into a dark area. Replace any light bulbs as soon as they burn out. Set up your furniture so you have a clear path. Avoid moving your furniture around. If any of your floors are uneven, fix them. If there are any pets around you, be aware of where they are. Review your medicines with your doctor. Some medicines can make you feel dizzy. This can increase your chance of falling. Ask your doctor what other things that you can do to  help prevent falls. This information is not intended to replace advice given to you by your health care provider. Make sure you discuss any questions you have with your health care provider. Document Released: 08/28/2009 Document Revised: 04/08/2016 Document Reviewed: 12/06/2014 Elsevier Interactive Patient Education  2017 [employer].

## 2022-08-27 ENCOUNTER — Telehealth (HOSPITAL_BASED_OUTPATIENT_CLINIC_OR_DEPARTMENT_OTHER): Payer: Self-pay

## 2022-09-09 ENCOUNTER — Ambulatory Visit (HOSPITAL_BASED_OUTPATIENT_CLINIC_OR_DEPARTMENT_OTHER)
Admission: RE | Admit: 2022-09-09 | Discharge: 2022-09-09 | Disposition: A | Discharge status: Home / Self Care | Payer: Medicare Other | Source: Ambulatory Visit | Attending: Family Medicine | Admitting: Family Medicine

## 2022-09-09 DIAGNOSIS — R739 Hyperglycemia, unspecified: Secondary | ICD-10-CM | POA: Insufficient documentation

## 2022-09-09 DIAGNOSIS — M810 Age-related osteoporosis without current pathological fracture: Secondary | ICD-10-CM | POA: Diagnosis not present

## 2022-09-09 DIAGNOSIS — R1310 Dysphagia, unspecified: Secondary | ICD-10-CM | POA: Insufficient documentation

## 2022-09-09 DIAGNOSIS — R079 Chest pain, unspecified: Secondary | ICD-10-CM | POA: Insufficient documentation

## 2022-11-19 DIAGNOSIS — Z961 Presence of intraocular lens: Secondary | ICD-10-CM | POA: Diagnosis not present

## 2022-11-19 DIAGNOSIS — H35363 Drusen (degenerative) of macula, bilateral: Secondary | ICD-10-CM | POA: Diagnosis not present

## 2023-03-23 ENCOUNTER — Other Ambulatory Visit: Payer: Self-pay | Admitting: Family Medicine

## 2023-04-12 DIAGNOSIS — Z961 Presence of intraocular lens: Secondary | ICD-10-CM | POA: Diagnosis not present

## 2023-04-12 DIAGNOSIS — H35363 Drusen (degenerative) of macula, bilateral: Secondary | ICD-10-CM | POA: Diagnosis not present

## 2023-05-01 NOTE — Assessment & Plan Note (Signed)
Nursing note reviewed. Agree with documention and plan.  

## 2023-05-01 NOTE — Assessment & Plan Note (Signed)
On Levothyroxine, continue to monitor 

## 2023-05-01 NOTE — Assessment & Plan Note (Signed)
Encouraged to get adequate exercise, calcium and vitamin d intake 

## 2023-05-01 NOTE — Assessment & Plan Note (Signed)
hgba1c acceptable, minimize simple carbs. Increase exercise as tolerated.  

## 2023-05-01 NOTE — Assessment & Plan Note (Signed)
Hydrate and monitor 

## 2023-05-02 ENCOUNTER — Ambulatory Visit (INDEPENDENT_AMBULATORY_CARE_PROVIDER_SITE_OTHER): Payer: Medicare Other | Admitting: Family Medicine

## 2023-05-02 VITALS — BP 126/76 | HR 47 | Temp 97.5°F | Resp 16 | Ht 59.0 in | Wt 112.6 lb

## 2023-05-02 DIAGNOSIS — K219 Gastro-esophageal reflux disease without esophagitis: Secondary | ICD-10-CM

## 2023-05-02 DIAGNOSIS — E039 Hypothyroidism, unspecified: Secondary | ICD-10-CM | POA: Diagnosis not present

## 2023-05-02 DIAGNOSIS — R739 Hyperglycemia, unspecified: Secondary | ICD-10-CM

## 2023-05-02 DIAGNOSIS — M810 Age-related osteoporosis without current pathological fracture: Secondary | ICD-10-CM

## 2023-05-02 DIAGNOSIS — M791 Myalgia, unspecified site: Secondary | ICD-10-CM

## 2023-05-02 DIAGNOSIS — I8390 Asymptomatic varicose veins of unspecified lower extremity: Secondary | ICD-10-CM

## 2023-05-02 DIAGNOSIS — E559 Vitamin D deficiency, unspecified: Secondary | ICD-10-CM

## 2023-05-02 DIAGNOSIS — R350 Frequency of micturition: Secondary | ICD-10-CM

## 2023-05-02 DIAGNOSIS — K449 Diaphragmatic hernia without obstruction or gangrene: Secondary | ICD-10-CM | POA: Diagnosis not present

## 2023-05-02 LAB — POCT URINALYSIS DIP (MANUAL ENTRY)
Bilirubin, UA: NEGATIVE
Blood, UA: NEGATIVE
Glucose, UA: NEGATIVE mg/dL
Ketones, POC UA: NEGATIVE mg/dL
Nitrite, UA: NEGATIVE
Protein Ur, POC: NEGATIVE mg/dL
Spec Grav, UA: 1.01 (ref 1.010–1.025)
Urobilinogen, UA: 0.2 E.U./dL
pH, UA: 5 (ref 5.0–8.0)

## 2023-05-02 NOTE — Progress Notes (Signed)
Subjective:    Patient ID: Cynthia Young, female    DOB: 1930-03-08, 89 y.o.   MRN: [id number]  Chief Complaint  Patient presents with  . Dysuria    HPI Discussed the use of AI scribe software for clinical note transcription with the patient, who gave verbal consent to proceed.  History of Present Illness   The patient, a 89 year old with a past medical history of prediabetes and hypothyroidism, presents with urinary symptoms that started about a week and a half ago. The patient reports increased urinary frequency and urgency, with minimal urine output. The patient also describes an itchy sensation and discomfort, particularly when wiping. The symptoms seem to subside and then return. The patient denies hematuria, fever, chills, and body aches.  In addition to the urinary symptoms, the patient reports intermittent chest pain, which they have been managing by waiting for the pain to subside. The patient also mentions having varicose veins, which cause discomfort and swelling, particularly at night. The patient also reports neuropathy, with a loss of sensation in the feet.  The patient's appetite is good, and they report always feeling hungry. However, they have not gained weight despite their increased food intake. The patient also mentions a sweet tooth, particularly for ice cream and chocolate.    Denies CP/palp/SOB/HA/congestion/fevers/GI c/o. Taking meds as prescribed     Past Medical History:  Diagnosis Date  . Arthritis   . Hyperglycemia 09/04/2020  . Hyperlipidemia   . Hypothyroid     Past Surgical History:  Procedure Laterality Date  . BACK SURGERY    . BIOPSY  04/12/2020   Procedure: BIOPSY;  Surgeon: Meridee Score Netty Starring., MD;  Location: [facility];  Service: Gastroenterology;;  . Arbutus Leas Left   . CHOLECYSTECTOMY N/A 04/11/2020   Procedure: LAPAROSCOPIC CHOLECYSTECTOMY WITH INTRAOPERATIVE CHOLANGIOGRAM;  Surgeon: Ovidio Kin, MD;  Location: [facility];  Service:  General;  Laterality: N/A;  . ERCP N/A 04/12/2020   Procedure: ENDOSCOPIC RETROGRADE CHOLANGIOPANCREATOGRAPHY (ERCP);  Surgeon: Lemar Lofty., MD;  Location: [facility];  Service: Gastroenterology;  Laterality: N/A;  . FOOT SURGERY Right    bunion  . REMOVAL OF STONES  04/12/2020   Procedure: REMOVAL OF STONES;  Surgeon: Meridee Score Netty Starring., MD;  Location: [facility];  Service: Gastroenterology;;  . Dennison Mascot  04/12/2020   Procedure: Dennison Mascot;  Surgeon: Mansouraty, Netty Starring., MD;  Location: [facility];  Service: Gastroenterology;;    Family History  Problem Relation Age of Onset  . Cancer Mother   . Heart disease Father   . Arthritis Father        rheumatoid  . Cancer Brother        colon  . Colon cancer Brother   . Esophageal cancer Neg Hx   . Inflammatory bowel disease Neg Hx   . Liver disease Neg Hx   . Pancreatic cancer Neg Hx   . Stomach cancer Neg Hx     Social History   Socioeconomic History  . Marital status: Widowed    Spouse name: Not on file  . Number of children: Not on file  . Years of education: Not on file  . Highest education level: Not on file  Occupational History  . Not on file  Tobacco Use  . Smoking status: Never  . Smokeless tobacco: Never  Vaping Use  . Vaping Use: Never used  Substance and Sexual Activity  . Alcohol use: Yes    Comment: socially  . Drug use: Never  . Sexual activity: Not  Currently  Other Topics Concern  . Not on file  Social History Narrative   worked in [employer], short time. No cigarettes or drug use. Live by self no dietary restictions and walks daily   Social Determinants of Health   Financial Resource Strain: Low Risk  (04/30/2021)   Overall Financial Resource Strain (CARDIA)   . Difficulty of Paying Living Expenses: Not very hard  Food Insecurity: No Food Insecurity (08/05/2021)   Hunger Vital Sign   . Worried About Programme researcher, broadcasting/film/video in the Last Year: Never true   . Ran Out of  Food in the Last Year: Never true  Transportation Needs: No Transportation Needs (08/05/2021)   PRAPARE - Transportation   . Lack of Transportation (Medical): No   . Lack of Transportation (Non-Medical): No  Physical Activity: Inactive (08/05/2021)   Exercise Vital Sign   . Days of Exercise per Week: 0 days   . Minutes of Exercise per Session: 0 min  Stress: No Stress Concern Present (08/05/2021)   [employer] of Occupational Health - Occupational Stress Questionnaire   . Feeling of Stress : Not at all  Social Connections: Socially Isolated (08/05/2021)   Social Connection and Isolation Panel [NHANES]   . Frequency of Communication with Friends and Family: More than three times a week   . Frequency of Social Gatherings with Friends and Family: Once a week   . Attends Religious Services: Never   . Active Member of Clubs or Organizations: No   . Attends Banker Meetings: Never   . Marital Status: Widowed  Intimate Partner Violence: Not At Risk (08/05/2021)   Humiliation, Afraid, Rape, and Kick questionnaire   . Fear of Current or Ex-Partner: No   . Emotionally Abused: No   . Physically Abused: No   . Sexually Abused: No    Outpatient Medications Prior to Visit  Medication Sig Dispense Refill  . Biotin 1 MG CAPS Take by mouth.    . famotidine (PEPCID) 20 MG tablet Take 1 tablet (20 mg total) by mouth daily. 30 tablet 5  . levothyroxine (SYNTHROID) 50 MCG tablet TAKE ONE TABLET BY MOUTH ONE TIME DAILY 90 tablet 1  . Magnesium 250 MG TABS Take 1 tablet by mouth 4 (four) times daily.     No facility-administered medications prior to visit.    No Known Allergies  Review of Systems  Constitutional:  Negative for fever and malaise/fatigue.  HENT:  Negative for congestion.   Eyes:  Negative for blurred vision.  Respiratory:  Negative for shortness of breath.   Cardiovascular:  Negative for chest pain, palpitations and leg swelling.  Gastrointestinal:  Negative for  abdominal pain, blood in stool and nausea.  Genitourinary:  Positive for dysuria and frequency. Negative for flank pain and hematuria.  Musculoskeletal:  Negative for back pain and falls.  Skin:  Negative for rash.  Neurological:  Negative for dizziness, loss of consciousness and headaches.  Endo/Heme/Allergies:  Negative for environmental allergies.  Psychiatric/Behavioral:  Negative for depression. The patient is not nervous/anxious.       Objective:    Physical Exam Constitutional:      General: She is not in acute distress.    Appearance: Normal appearance. She is well-developed. She is not toxic-appearing.  HENT:     Head: Normocephalic and atraumatic.     Right Ear: External ear normal.     Left Ear: External ear normal.     Nose: Nose normal.  Eyes:  General:        Right eye: No discharge.        Left eye: No discharge.     Conjunctiva/sclera: Conjunctivae normal.  Neck:     Thyroid: No thyromegaly.  Cardiovascular:     Rate and Rhythm: Normal rate and regular rhythm.     Heart sounds: Normal heart sounds. No murmur heard. Pulmonary:     Effort: Pulmonary effort is normal. No respiratory distress.     Breath sounds: Normal breath sounds.  Abdominal:     General: Bowel sounds are normal.     Palpations: Abdomen is soft.     Tenderness: There is no abdominal tenderness. There is no guarding.  Musculoskeletal:        General: Normal range of motion.     Cervical back: Neck supple.  Lymphadenopathy:     Cervical: No cervical adenopathy.  Skin:    General: Skin is warm and dry.  Neurological:     Mental Status: She is alert and oriented to person, place, and time.  Psychiatric:        Mood and Affect: Mood normal.        Behavior: Behavior normal.        Thought Content: Thought content normal.        Judgment: Judgment normal.   BP 126/76 (BP Location: Left Arm, Patient Position: Sitting, Cuff Size: Normal)   Pulse (!) 47   Temp (!) 97.5 F (36.4 C)  (Oral)   Resp 16   Ht 4\' 11"  (1.499 m)   Wt 112 lb 9.6 oz (51.1 kg)   SpO2 95%   BMI 22.74 kg/m  Wt Readings from Last 3 Encounters:  05/02/23 112 lb 9.6 oz (51.1 kg)  08/25/22 113 lb 3.2 oz (51.3 kg)  08/11/22 114 lb (51.7 kg)    Diabetic Foot Exam - Simple   No data filed    Lab Results  Component Value Date   WBC 6.7 12/17/2021   HGB 14.1 12/17/2021   HCT 44.2 12/17/2021   PLT 248.0 12/17/2021   GLUCOSE 85 12/21/2021   CHOL 211 (H) 12/17/2021   TRIG 99.0 12/17/2021   HDL 65.60 12/17/2021   LDLCALC 125 (H) 12/17/2021   ALT 9 12/21/2021   AST 15 12/21/2021   NA 142 12/21/2021   K 4.6 12/21/2021   CL 104 12/21/2021   CREATININE 0.89 12/21/2021   BUN 20 12/21/2021   CO2 30 12/21/2021   TSH 2.12 12/17/2021   HGBA1C 6.0 12/17/2021    Lab Results  Component Value Date   TSH 2.12 12/17/2021   Lab Results  Component Value Date   WBC 6.7 12/17/2021   HGB 14.1 12/17/2021   HCT 44.2 12/17/2021   MCV 93.4 12/17/2021   PLT 248.0 12/17/2021   Lab Results  Component Value Date   NA 142 12/21/2021   K 4.6 12/21/2021   CO2 30 12/21/2021   GLUCOSE 85 12/21/2021   BUN 20 12/21/2021   CREATININE 0.89 12/21/2021   BILITOT 0.4 12/21/2021   ALKPHOS 58 12/21/2021   AST 15 12/21/2021   ALT 9 12/21/2021   PROT 6.7 12/21/2021   ALBUMIN 4.3 12/21/2021   CALCIUM 9.7 12/21/2021   ANIONGAP 10 01/14/2021   GFR 57.28 (L) 12/21/2021   Lab Results  Component Value Date   CHOL 211 (H) 12/17/2021   Lab Results  Component Value Date   HDL 65.60 12/17/2021   Lab Results  Component Value Date  LDLCALC 125 (H) 12/17/2021   Lab Results  Component Value Date   TRIG 99.0 12/17/2021   Lab Results  Component Value Date   CHOLHDL 3 12/17/2021   Lab Results  Component Value Date   HGBA1C 6.0 12/17/2021       Assessment & Plan:  Hypothyroidism, unspecified type Assessment & Plan: On Levothyroxine, continue to monitor   Orders: -     Lipid panel; Future -      TSH; Future  Hyperglycemia Assessment & Plan: hgba1c acceptable, minimize simple carbs. Increase exercise as tolerated.    Orders: -     Comprehensive metabolic panel; Future -     Hemoglobin A1c; Future -     Lipid panel; Future  Osteoporosis, unspecified osteoporosis type, unspecified pathological fracture presence Assessment & Plan: Encouraged to get adequate exercise, calcium and vitamin d intake    Hiatal hernia with GERD Assessment & Plan: Nursing note reviewed. Agree with documention and plan.   Orders: -     CBC with Differential/Platelet; Future  Myalgia Assessment & Plan: Hydrate and monitor   Orders: -     Magnesium; Future -     Vitamin D 1,25 dihydroxy; Future  Urinary frequency Assessment & Plan: 1.5 weeks with urinary frequency, urgency, dysuria, itching. No hematuria. No worsening myalgias, is denying fevers, chills. Check UA and culture and hydrate well  Orders: -     Urine Culture -     POCT urinalysis dipstick  Vitamin D deficiency -     Vitamin D 1,25 dihydroxy; Future  Varicose veins of lower extremity, unspecified laterality, unspecified whether complicated    Assessment and Plan    Urinary Symptoms: Frequency, urgency, and discomfort for approximately 2 weeks. No hematuria, fever, or systemic symptoms. Possible dietary irritants. -Collect urine culture today. -Consider probiotic and cranberry tablets for urinary health.  Prediabetes: Hemoglobin A1c of 6.0. Discussed dietary modifications to manage blood sugar levels. -Continue monitoring diet, focusing on limiting multiple carbohydrates per meal.  Varicose Veins: Complaints of leg pain and swelling, particularly at night. No signs of deep vein thrombosis. -Referral to [facility] for evaluation and potential treatment options.  Chest Pain: Occasional chest discomfort, but no severe symptoms suggestive of acute cardiac event. History of low pulse and previous  consideration for pacemaker. -Consider re-evaluation by cardiology if symptoms persist or worsen.  General Health Maintenance: -Order comprehensive blood work today, including kidney function and thyroid levels. -Plan for follow-up visit in approximately 6 months, unless unexpected findings in blood work necessitate earlier visit.         Danise Edge, MD

## 2023-05-02 NOTE — Patient Instructions (Signed)
Urinary Tract Infection, Adult  A urinary tract infection (UTI) is an infection of any part of the urinary tract. The urinary tract includes the kidneys, ureters, bladder, and urethra. These organs make, store, and get rid of urine in the body. An upper UTI affects the ureters and kidneys. A lower UTI affects the bladder and urethra. What are the causes? Most urinary tract infections are caused by bacteria in your genital area around your urethra, where urine leaves your body. These bacteria grow and cause inflammation of your urinary tract. What increases the risk? You are more likely to develop this condition if: You have a urinary catheter that stays in place. You are not able to control when you urinate or have a bowel movement (incontinence). You are female and you: Use a spermicide or diaphragm for birth control. Have low estrogen levels. Are pregnant. You have certain genes that increase your risk. You are sexually active. You take antibiotic medicines. You have a condition that causes your flow of urine to slow down, such as: An enlarged prostate, if you are female. Blockage in your urethra. A kidney stone. A nerve condition that affects your bladder control (neurogenic bladder). Not getting enough to drink, or not urinating often. You have certain medical conditions, such as: Diabetes. A weak disease-fighting system (immunesystem). Sickle cell disease. Gout. Spinal cord injury. What are the signs or symptoms? Symptoms of this condition include: Needing to urinate right away (urgency). Frequent urination. This may include small amounts of urine each time you urinate. Pain or burning with urination. Blood in the urine. Urine that smells bad or unusual. Trouble urinating. Cloudy urine. Vaginal discharge, if you are female. Pain in the abdomen or the lower back. You may also have: Vomiting or a decreased appetite. Confusion. Irritability or tiredness. A fever or  chills. Diarrhea. The first symptom in older adults may be confusion. In some cases, they may not have any symptoms until the infection has worsened. How is this diagnosed? This condition is diagnosed based on your medical history and a physical exam. You may also have other tests, including: Urine tests. Blood tests. Tests for STIs (sexually transmitted infections). If you have had more than one UTI, a cystoscopy or imaging studies may be done to determine the cause of the infections. How is this treated? Treatment for this condition includes: Antibiotic medicine. Over-the-counter medicines to treat discomfort. Drinking enough water to stay hydrated. If you have frequent infections or have other conditions such as a kidney stone, you may need to see a health care provider who specializes in the urinary tract (urologist). In rare cases, urinary tract infections can cause sepsis. Sepsis is a life-threatening condition that occurs when the body responds to an infection. Sepsis is treated in the hospital with IV antibiotics, fluids, and other medicines. Follow these instructions at home:  Medicines Take over-the-counter and prescription medicines only as told by your health care provider. If you were prescribed an antibiotic medicine, take it as told by your health care provider. Do not stop using the antibiotic even if you start to feel better. General instructions Make sure you: Empty your bladder often and completely. Do not hold urine for long periods of time. Empty your bladder after sex. Wipe from front to back after urinating or having a bowel movement if you are female. Use each tissue only one time when you wipe. Drink enough fluid to keep your urine pale yellow. Keep all follow-up visits. This is important. Contact a health   care provider if: Your symptoms do not get better after 1-2 days. Your symptoms go away and then return. Get help right away if: You have severe pain in  your back or your lower abdomen. You have a fever or chills. You have nausea or vomiting. Summary A urinary tract infection (UTI) is an infection of any part of the urinary tract, which includes the kidneys, ureters, bladder, and urethra. Most urinary tract infections are caused by bacteria in your genital area. Treatment for this condition often includes antibiotic medicines. If you were prescribed an antibiotic medicine, take it as told by your health care provider. Do not stop using the antibiotic even if you start to feel better. Keep all follow-up visits. This is important. This information is not intended to replace advice given to you by your health care provider. Make sure you discuss any questions you have with your health care provider. Document Revised: 06/08/2020 Document Reviewed: 06/13/2020 Elsevier Patient Education  2024 [employer] Inc[employer]  

## 2023-05-02 NOTE — Assessment & Plan Note (Addendum)
1.5 weeks with urinary frequency, urgency, dysuria, itching. No hematuria. No worsening myalgias, is denying fevers, chills. Check UA and culture and hydrate well

## 2023-05-03 ENCOUNTER — Telehealth: Payer: Self-pay | Admitting: *Deleted

## 2023-05-03 LAB — URINE CULTURE
MICRO NUMBER:: 15090772
Result:: NO GROWTH
SPECIMEN QUALITY:: ADEQUATE

## 2023-05-03 NOTE — Telephone Encounter (Signed)
Pt was seen by Dr Abner Greenspan yesterday and did not stop in the lab for bloodwork.  Called pt to schedule lab appt and pt asked about urine results.  Advised pt no overwhelming signs of infection. Urine has trace of leukocytes and a culture was sent to the lab and will result in a couple of days to determine if there is any true infection. Pt voices understanding and scheduled lab appt for 05/05/23 at 10:15am.

## 2023-05-04 ENCOUNTER — Telehealth: Payer: Self-pay | Admitting: *Deleted

## 2023-05-04 ENCOUNTER — Other Ambulatory Visit: Payer: Self-pay | Admitting: Family Medicine

## 2023-05-04 ENCOUNTER — Other Ambulatory Visit (INDEPENDENT_AMBULATORY_CARE_PROVIDER_SITE_OTHER): Payer: Medicare Other

## 2023-05-04 DIAGNOSIS — E039 Hypothyroidism, unspecified: Secondary | ICD-10-CM | POA: Diagnosis not present

## 2023-05-04 DIAGNOSIS — M791 Myalgia, unspecified site: Secondary | ICD-10-CM

## 2023-05-04 DIAGNOSIS — K449 Diaphragmatic hernia without obstruction or gangrene: Secondary | ICD-10-CM | POA: Diagnosis not present

## 2023-05-04 DIAGNOSIS — R739 Hyperglycemia, unspecified: Secondary | ICD-10-CM | POA: Diagnosis not present

## 2023-05-04 DIAGNOSIS — E559 Vitamin D deficiency, unspecified: Secondary | ICD-10-CM | POA: Diagnosis not present

## 2023-05-04 DIAGNOSIS — K219 Gastro-esophageal reflux disease without esophagitis: Secondary | ICD-10-CM

## 2023-05-04 DIAGNOSIS — R079 Chest pain, unspecified: Secondary | ICD-10-CM

## 2023-05-04 DIAGNOSIS — E785 Hyperlipidemia, unspecified: Secondary | ICD-10-CM

## 2023-05-04 LAB — LIPID PANEL
Cholesterol: 186 mg/dL (ref 0–200)
HDL: 54.8 mg/dL (ref >39.00)
LDL Cholesterol: 112 mg/dL — ABNORMAL HIGH (ref 0–99)
NonHDL: 130.84
Total CHOL/HDL Ratio: 3
Triglycerides: 93 mg/dL (ref 0.0–149.0)
VLDL: 18.6 mg/dL (ref 0.0–40.0)

## 2023-05-04 LAB — COMPREHENSIVE METABOLIC PANEL
ALT: 8 U/L (ref 0–35)
AST: 15 U/L (ref 0–37)
Albumin: 4 g/dL (ref 3.5–5.2)
Alkaline Phosphatase: 56 U/L (ref 39–117)
BUN: 15 mg/dL (ref 6–23)
CO2: 26 mEq/L (ref 19–32)
Calcium: 9.2 mg/dL (ref 8.4–10.5)
Chloride: 104 mEq/L (ref 96–112)
Creatinine, Ser: 0.84 mg/dL (ref 0.40–1.20)
GFR: 60.81 mL/min (ref >60.00)
Glucose, Bld: 93 mg/dL (ref 70–99)
Potassium: 4.4 mEq/L (ref 3.5–5.1)
Sodium: 139 mEq/L (ref 135–145)
Total Bilirubin: 0.5 mg/dL (ref 0.2–1.2)
Total Protein: 6.6 g/dL (ref 6.0–8.3)

## 2023-05-04 LAB — CBC WITH DIFFERENTIAL/PLATELET
Basophils Absolute: 0.1 10*3/uL (ref 0.0–0.1)
Basophils Relative: 1 % (ref 0.0–3.0)
Eosinophils Absolute: 0.2 10*3/uL (ref 0.0–0.7)
Eosinophils Relative: 2.5 % (ref 0.0–5.0)
HCT: 41.8 % (ref 36.0–46.0)
Hemoglobin: 13.6 g/dL (ref 12.0–15.0)
Lymphocytes Relative: 31.2 % (ref 12.0–46.0)
Lymphs Abs: 2 10*3/uL (ref 0.7–4.0)
MCHC: 32.5 g/dL (ref 30.0–36.0)
MCV: 91.9 fl (ref 78.0–100.0)
Monocytes Absolute: 0.6 10*3/uL (ref 0.1–1.0)
Monocytes Relative: 9.7 % (ref 3.0–12.0)
Neutro Abs: 3.6 10*3/uL (ref 1.4–7.7)
Neutrophils Relative %: 55.6 % (ref 43.0–77.0)
Platelets: 269 10*3/uL (ref 150.0–400.0)
RBC: 4.55 Mil/uL (ref 3.87–5.11)
RDW: 13.7 % (ref 11.5–15.5)
WBC: 6.4 10*3/uL (ref 4.0–10.5)

## 2023-05-04 LAB — MAGNESIUM: Magnesium: 2.1 mg/dL (ref 1.5–2.5)

## 2023-05-04 LAB — HEMOGLOBIN A1C: Hgb A1c MFr Bld: 6 % (ref 4.6–6.5)

## 2023-05-04 LAB — TSH: TSH: 2.69 u[IU]/mL (ref 0.35–5.50)

## 2023-05-04 MED ORDER — NYSTATIN 100000 UNIT/GM EX CREA: 1.0000 | TOPICAL_CREAM | Freq: Two times a day (BID) | CUTANEOUS | 0 refills | Status: DC | PRN

## 2023-05-04 NOTE — Telephone Encounter (Signed)
Patient notified of urine results and that medication was sent in .

## 2023-05-04 NOTE — Telephone Encounter (Signed)
Pt came in to complete labs this morning and asked about the urine culture result and "was she going to get an antibiotic for her urinary infection"?  Advised pt per result that urine culture was negative for infection. Pt states she is having burning and irritation of vulva.  She will continue to drink cranberry but is there anything else she can do to relieve the burning pain?

## 2023-05-05 ENCOUNTER — Other Ambulatory Visit: Payer: Medicare Other

## 2023-05-07 LAB — VITAMIN D 1,25 DIHYDROXY
Vitamin D 1, 25 (OH)2 Total: 34 pg/mL (ref 18–72)
Vitamin D2 1, 25 (OH)2: <8 pg/mL
Vitamin D3 1, 25 (OH)2: 34 pg/mL

## 2023-06-08 ENCOUNTER — Telehealth: Payer: Self-pay | Admitting: Family Medicine

## 2023-06-08 NOTE — Telephone Encounter (Signed)
Pt is wondering if there is a way she can take a higher magnesium dose as it is hard for her to remember to take it 4 times a day.

## 2023-06-09 ENCOUNTER — Other Ambulatory Visit: Payer: Self-pay

## 2023-06-09 DIAGNOSIS — R739 Hyperglycemia, unspecified: Secondary | ICD-10-CM

## 2023-06-09 MED ORDER — MAGNESIUM 250 MG PO TABS: 250.0000 mg | ORAL_TABLET | Freq: Two times a day (BID) | ORAL | 3 refills | Status: DC

## 2023-06-09 MED ORDER — MAGNESIUM 250 MG PO TABS: 250.0000 mg | ORAL_TABLET | Freq: Two times a day (BID) | ORAL | Status: AC

## 2023-06-09 NOTE — Telephone Encounter (Signed)
Called pt was advised per Dr.Blyth of the change Magnesium 250 mg tid. Pt was scheduled for follow and repeat [facility] lab appt.

## 2023-06-30 ENCOUNTER — Encounter (INDEPENDENT_AMBULATORY_CARE_PROVIDER_SITE_OTHER): Payer: Self-pay

## 2023-07-28 ENCOUNTER — Ambulatory Visit (INDEPENDENT_AMBULATORY_CARE_PROVIDER_SITE_OTHER): Payer: Medicare Other | Admitting: Family Medicine

## 2023-07-28 VITALS — BP 122/72 | HR 48 | Temp 98.0°F | Resp 16 | Ht 59.0 in | Wt 110.0 lb

## 2023-07-28 DIAGNOSIS — M810 Age-related osteoporosis without current pathological fracture: Secondary | ICD-10-CM | POA: Diagnosis not present

## 2023-07-28 DIAGNOSIS — K219 Gastro-esophageal reflux disease without esophagitis: Secondary | ICD-10-CM

## 2023-07-28 DIAGNOSIS — K449 Diaphragmatic hernia without obstruction or gangrene: Secondary | ICD-10-CM | POA: Diagnosis not present

## 2023-07-28 DIAGNOSIS — M791 Myalgia, unspecified site: Secondary | ICD-10-CM | POA: Diagnosis not present

## 2023-07-28 DIAGNOSIS — E785 Hyperlipidemia, unspecified: Secondary | ICD-10-CM

## 2023-07-28 DIAGNOSIS — R739 Hyperglycemia, unspecified: Secondary | ICD-10-CM

## 2023-07-28 NOTE — Assessment & Plan Note (Signed)
Hydrate and monitor 

## 2023-07-28 NOTE — Assessment & Plan Note (Signed)
Encouraged to get adequate exercise, calcium and vitamin d intake 

## 2023-07-28 NOTE — Assessment & Plan Note (Signed)
Avoid offending foods, start probiotics. Do not eat large meals in late evening and consider raising head of bed.  

## 2023-07-28 NOTE — Patient Instructions (Addendum)
Blue emu, biofreeze and Lidocaine for join  Magnesium Glycinate 200-400 mg at bedtime  L Typtophan capsules at bedtime  Encouraged increased hydration and fiber in diet. Daily probiotics. If bowels not moving can use MOM 2 tbls po in 4 oz of warm prune juice by mouth every 2-3 days. If no results then repeat in 4 hours with  Dulcolax suppository pr, may repeat again in 4 more hours as needed. Seek care if symptoms worsen. Consider daily Miralax and/or Dulcolax if symptoms persist.      Hypothyroidism  Hypothyroidism is when the thyroid gland does not make enough of certain hormones. This is called an underactive thyroid. The thyroid gland is a small gland located in the lower front part of the neck, just in front of the windpipe (trachea). This gland makes hormones that help control how the body uses food for energy (metabolism) as well as how the heart and brain function. These hormones also play a role in keeping your bones strong. When the thyroid is underactive, it produces too little of the hormones thyroxine (T4) and triiodothyronine (T3). What are the causes? This condition may be caused by: Hashimoto's disease. This is a disease in which the body's disease-fighting system (immune system) attacks the thyroid gland. This is the most common cause. Viral infections. Pregnancy. Certain medicines. Birth defects. Problems with a gland in the center of the brain (pituitary gland). Lack of enough iodine in the diet. Other causes may include: Past radiation treatments to the head or neck for cancer. Past treatment with radioactive iodine. Past exposure to radiation in the environment. Past surgical removal of part or all of the thyroid. What increases the risk? You are more likely to develop this condition if: You are female. You have a family history of thyroid conditions. You use a medicine called lithium. You take medicines that affect the immune system (immunosuppressants). What  are the signs or symptoms? Common symptoms of this condition include: Not being able to tolerate cold. Feeling as though you have no energy (lethargy). Lack of appetite. Constipation. Sadness or depression. Weight gain that is not explained by a change in diet or exercise habits. Menstrual irregularity. Dry skin, coarse hair, or brittle nails. Other symptoms may include: Muscle pain. Slowing of thought processes. Poor memory. How is this diagnosed? This condition may be diagnosed based on: Your symptoms, your medical history, and a physical exam. Blood tests. You may also have imaging tests, such as an ultrasound or MRI. How is this treated? This condition is treated with medicine that replaces the thyroid hormones that your body does not make. After you begin treatment, it may take several weeks for symptoms to go away. Follow these instructions at home: Take over-the-counter and prescription medicines only as told by your health care provider. If you start taking any new medicines, tell your health care provider. Keep all follow-up visits as told by your health care provider. This is important. As your condition improves, your dosage of thyroid hormone medicine may change. You will need to have blood tests regularly so that your health care provider can monitor your condition. Contact a health care provider if: Your symptoms do not get better with treatment. You are taking thyroid hormone replacement medicine and you: Sweat a lot. Have tremors. Feel anxious. Lose weight rapidly. Cannot tolerate heat. Have emotional swings. Have diarrhea. Feel weak. Get help right away if: You have chest pain. You have an irregular heartbeat. You have a rapid heartbeat. You have difficulty  breathing. These symptoms may be an emergency. Get help right away. Call 911. Do not wait to see if the symptoms will go away. Do not drive yourself to the hospital. Summary Hypothyroidism is when  the thyroid gland does not make enough of certain hormones (it is underactive). When the thyroid is underactive, it produces too little of the hormones thyroxine (T4) and triiodothyronine (T3). The most common cause is Hashimoto's disease, a disease in which the body's disease-fighting system (immune system) attacks the thyroid gland. The condition can also be caused by viral infections, medicine, pregnancy, or past radiation treatment to the head or neck. Symptoms may include weight gain, dry skin, constipation, feeling as though you do not have energy, and not being able to tolerate cold. This condition is treated with medicine to replace the thyroid hormones that your body does not make. This information is not intended to replace advice given to you by your health care provider. Make sure you discuss any questions you have with your health care provider. Document Revised: 11/03/2021 Document Reviewed: 11/03/2021 Elsevier Patient Education  2024 [employer].

## 2023-07-28 NOTE — Assessment & Plan Note (Signed)
hgba1c acceptable, minimize simple carbs. Increase exercise as tolerated.  

## 2023-07-29 ENCOUNTER — Encounter: Payer: Self-pay | Admitting: Family Medicine

## 2023-07-29 NOTE — Progress Notes (Signed)
Subjective:    Patient ID: Cynthia Young, female    DOB: [birth year], 89 y.o.   MRN: [id number]  Chief Complaint  Patient presents with   Follow-up    Follow up    HPI Discussed the use of AI scribe software for clinical note transcription with the patient, who gave verbal consent to proceed.  History of Present Illness   The patient, with a past medical history of heart issues, presents with complaints of difficulty sleeping and joint pain. The patient reports feeling tired throughout the day and having difficulty falling asleep at night. The patient has tried various over-the-counter sleep aids with limited success. The patient also reports chronic joint pain that varies in location and intensity from day to day. The patient has tried various topical treatments for the joint pain, but reports that the pain persists. The patient also mentions occasional constipation, which they manage with over-the-counter remedies as needed. The patient also reports a recent episode of shortness of breath and elevated heart rate after physical exertion, which resolved with rest.        Past Medical History:  Diagnosis Date   Arthritis    Hyperglycemia 09/04/2020   Hyperlipidemia    Hypothyroid     Past Surgical History:  Procedure Laterality Date   BACK SURGERY     BIOPSY  04/12/2020   Procedure: BIOPSY;  Surgeon: Lemar Lofty., MD;  Location: [facility];  Service: Gastroenterology;;   Arbutus Leas Left    CHOLECYSTECTOMY N/A 04/11/2020   Procedure: LAPAROSCOPIC CHOLECYSTECTOMY WITH INTRAOPERATIVE CHOLANGIOGRAM;  Surgeon: Ovidio Kin, MD;  Location: [facility];  Service: General;  Laterality: N/A;   ERCP N/A 04/12/2020   Procedure: ENDOSCOPIC RETROGRADE CHOLANGIOPANCREATOGRAPHY (ERCP);  Surgeon: Lemar Lofty., MD;  Location: [facility];  Service: Gastroenterology;  Laterality: N/A;   FOOT SURGERY Right    bunion   REMOVAL OF STONES  04/12/2020   Procedure: REMOVAL OF  STONES;  Surgeon: Meridee Score Netty Starring., MD;  Location: [facility];  Service: Gastroenterology;;   Dennison Mascot  04/12/2020   Procedure: Dennison Mascot;  Surgeon: Mansouraty, Netty Starring., MD;  Location: [facility];  Service: Gastroenterology;;    Family History  Problem Relation Age of Onset   Cancer Mother    Heart disease Father    Arthritis Father        rheumatoid   Cancer Brother        colon   Colon cancer Brother    Esophageal cancer Neg Hx    Inflammatory bowel disease Neg Hx    Liver disease Neg Hx    Pancreatic cancer Neg Hx    Stomach cancer Neg Hx     Social History   Socioeconomic History   Marital status: Widowed    Spouse name: Not on file   Number of children: Not on file   Years of education: Not on file   Highest education level: Not on file  Occupational History   Not on file  Tobacco Use   Smoking status: Never   Smokeless tobacco: Never  Vaping Use   Vaping status: Never Used  Substance and Sexual Activity   Alcohol use: Yes    Comment: socially   Drug use: Never   Sexual activity: Not Currently  Other Topics Concern   Not on file  Social History Narrative   worked in [employer] office, short time. No cigarettes or drug use. Live by self no dietary restictions and walks daily   Social Determinants of Health  Financial Resource Strain: Low Risk  (04/30/2021)   Overall Financial Resource Strain (CARDIA)    Difficulty of Paying Living Expenses: Not very hard  Food Insecurity: No Food Insecurity (08/05/2021)   Hunger Vital Sign    Worried About Running Out of Food in the Last Year: Never true    Ran Out of Food in the Last Year: Never true  Transportation Needs: No Transportation Needs (08/05/2021)   PRAPARE - Administrator, Civil Service (Medical): No    Lack of Transportation (Non-Medical): No  Physical Activity: Inactive (08/05/2021)   Exercise Vital Sign    Days of Exercise per Week: 0 days    Minutes of Exercise per  Session: 0 min  Stress: No Stress Concern Present (08/05/2021)   [employer] of Occupational Health - Occupational Stress Questionnaire    Feeling of Stress : Not at all  Social Connections: Socially Isolated (08/05/2021)   Social Connection and Isolation Panel [NHANES]    Frequency of Communication with Friends and Family: More than three times a week    Frequency of Social Gatherings with Friends and Family: Once a week    Attends Religious Services: Never    Database administrator or Organizations: No    Attends Banker Meetings: Never    Marital Status: Widowed  Intimate Partner Violence: Not At Risk (08/05/2021)   Humiliation, Afraid, Rape, and Kick questionnaire    Fear of Current or Ex-Partner: No    Emotionally Abused: No    Physically Abused: No    Sexually Abused: No    Outpatient Medications Prior to Visit  Medication Sig Dispense Refill   Biotin 1 MG CAPS Take by mouth.     famotidine (PEPCID) 20 MG tablet Take 1 tablet (20 mg total) by mouth daily. 30 tablet 5   levothyroxine (SYNTHROID) 50 MCG tablet TAKE ONE TABLET BY MOUTH ONE TIME DAILY 90 tablet 1   Magnesium 250 MG TABS Take 1 tablet (250 mg total) by mouth 2 (two) times daily.     nystatin cream (MYCOSTATIN) Apply 1 Application topically 2 (two) times daily as needed for dry skin. 30 g 0   No facility-administered medications prior to visit.    No Known Allergies  Review of Systems  Constitutional:  Positive for malaise/fatigue. Negative for fever.  HENT:  Negative for congestion.   Eyes:  Negative for blurred vision.  Respiratory:  Negative for shortness of breath.   Cardiovascular:  Negative for chest pain, palpitations and leg swelling.  Gastrointestinal:  Positive for constipation. Negative for abdominal pain, blood in stool and nausea.  Genitourinary:  Negative for dysuria and frequency.  Musculoskeletal:  Negative for falls.  Skin:  Negative for rash.  Neurological:  Negative for  dizziness, loss of consciousness and headaches.  Endo/Heme/Allergies:  Negative for environmental allergies.  Psychiatric/Behavioral:  Negative for depression. The patient has insomnia. The patient is not nervous/anxious.        Objective:    Physical Exam Constitutional:      General: She is not in acute distress.    Appearance: Normal appearance. She is well-developed. She is not toxic-appearing.  HENT:     Head: Normocephalic and atraumatic.     Right Ear: External ear normal.     Left Ear: External ear normal.     Nose: Nose normal.  Eyes:     General:        Right eye: No discharge.  Left eye: No discharge.     Conjunctiva/sclera: Conjunctivae normal.  Neck:     Thyroid: No thyromegaly.  Cardiovascular:     Rate and Rhythm: Normal rate and regular rhythm.     Heart sounds: Normal heart sounds. No murmur heard. Pulmonary:     Effort: Pulmonary effort is normal. No respiratory distress.     Breath sounds: Normal breath sounds.  Abdominal:     General: Bowel sounds are normal.     Palpations: Abdomen is soft.     Tenderness: There is no abdominal tenderness. There is no guarding.  Musculoskeletal:        General: Normal range of motion.     Cervical back: Neck supple.  Lymphadenopathy:     Cervical: No cervical adenopathy.  Skin:    General: Skin is warm and dry.  Neurological:     Mental Status: She is alert and oriented to person, place, and time.  Psychiatric:        Mood and Affect: Mood normal.        Behavior: Behavior normal.        Thought Content: Thought content normal.        Judgment: Judgment normal.     BP 122/72 (BP Location: Left Arm, Patient Position: Sitting, Cuff Size: Normal)   Pulse (!) 48   Temp 98 F (36.7 C) (Oral)   Resp 16   Ht 4\' 11"  (1.499 m)   Wt 110 lb (49.9 kg)   SpO2 96%   BMI 22.22 kg/m  Wt Readings from Last 3 Encounters:  07/28/23 110 lb (49.9 kg)  05/02/23 112 lb 9.6 oz (51.1 kg)  08/25/22 113 lb 3.2 oz (51.3  kg)    Diabetic Foot Exam - Simple   No data filed    Lab Results  Component Value Date   WBC 6.4 05/04/2023   HGB 13.6 05/04/2023   HCT 41.8 05/04/2023   PLT 269.0 05/04/2023   GLUCOSE 93 05/04/2023   CHOL 186 05/04/2023   TRIG 93.0 05/04/2023   HDL 54.80 05/04/2023   LDLCALC 112 (H) 05/04/2023   ALT 8 05/04/2023   AST 15 05/04/2023   NA 139 05/04/2023   K 4.4 05/04/2023   CL 104 05/04/2023   CREATININE 0.84 05/04/2023   BUN 15 05/04/2023   CO2 26 05/04/2023   TSH 2.69 05/04/2023   HGBA1C 6.0 05/04/2023    Lab Results  Component Value Date   TSH 2.69 05/04/2023   Lab Results  Component Value Date   WBC 6.4 05/04/2023   HGB 13.6 05/04/2023   HCT 41.8 05/04/2023   MCV 91.9 05/04/2023   PLT 269.0 05/04/2023   Lab Results  Component Value Date   NA 139 05/04/2023   K 4.4 05/04/2023   CO2 26 05/04/2023   GLUCOSE 93 05/04/2023   BUN 15 05/04/2023   CREATININE 0.84 05/04/2023   BILITOT 0.5 05/04/2023   ALKPHOS 56 05/04/2023   AST 15 05/04/2023   ALT 8 05/04/2023   PROT 6.6 05/04/2023   ALBUMIN 4.0 05/04/2023   CALCIUM 9.2 05/04/2023   ANIONGAP 10 01/14/2021   GFR 60.81 05/04/2023   Lab Results  Component Value Date   CHOL 186 05/04/2023   Lab Results  Component Value Date   HDL 54.80 05/04/2023   Lab Results  Component Value Date   LDLCALC 112 (H) 05/04/2023   Lab Results  Component Value Date   TRIG 93.0 05/04/2023   Lab Results  Component Value Date   CHOLHDL 3 05/04/2023   Lab Results  Component Value Date   HGBA1C 6.0 05/04/2023       Assessment & Plan:  Osteoporosis, unspecified osteoporosis type, unspecified pathological fracture presence Assessment & Plan: Encouraged to get adequate exercise, calcium and vitamin d intake    Myalgia Assessment & Plan: Hydrate and monitor    Hyperglycemia Assessment & Plan: hgba1c acceptable, minimize simple carbs. Increase exercise as tolerated.    Hiatal hernia with  GERD Assessment & Plan: Avoid offending foods, start probiotics. Do not eat large meals in late evening and consider raising head of bed.     Hyperlipidemia, unspecified hyperlipidemia type Assessment & Plan: Encourage heart healthy diet such as MIND or DASH diet, increase exercise, avoid trans fats, simple carbohydrates and processed foods, consider a krill or fish or flaxseed oil cap daily.       Assessment and Plan    Arthritis Daily pain in various joints, exacerbated by weather changes and poor sleep. Discussed the use of topical Lidocaine and other topical rubs like Biofreeze and Blue Emu. -Consider using Lidocaine on specific painful areas. -Consider using Biofreeze or Blue Emu for a cooling sensation.  Insomnia Difficulty falling asleep despite feeling tired. Discussed potential sleep aids including magnesium glycinate and L-tryptophan. -Consider switching current magnesium supplement to magnesium glycinate. -Consider adding L-tryptophan supplement. -Research and discuss potential sleep aid advertised by former [place].  Constipation Occasional constipation managed with over-the-counter remedies. -Continue current management with over-the-counter remedies as needed.  GERD Occasional acid reflux managed with Pepcid as needed. -Continue Pepcid as needed for acid reflux symptoms.  Bradycardia Slow heart rate noted on examination, previously discussed potential need for pacemaker. -Continue monitoring heart rate.  General Health Maintenance -Plan to return for follow-up and labs in early 2025. -Consider sending information on potential sleep aid for review. -If starting new supplements, plan to return for labs 3-4 weeks after initiation to monitor kidney and liver function.         Danise Edge, MD

## 2023-07-29 NOTE — Assessment & Plan Note (Signed)
Encourage heart healthy diet such as MIND or DASH diet, increase exercise, avoid trans fats, simple carbohydrates and processed foods, consider a krill or fish or flaxseed oil cap daily.  °

## 2023-09-13 ENCOUNTER — Telehealth: Payer: Self-pay | Admitting: Family Medicine

## 2023-09-13 NOTE — Telephone Encounter (Signed)
Copied from CRM [id number]. Topic: Medicare AWV >> Sep 13, 2023  2:23 PM Payton Doughty wrote: Reason for CRM: Called LVM 09/13/2023 to schedule Annual Wellness Visit  Verlee Rossetti; Care Guide Ambulatory Clinical Support [place] [facility] Direct Dial: [phone]

## 2023-09-23 ENCOUNTER — Ambulatory Visit (INDEPENDENT_AMBULATORY_CARE_PROVIDER_SITE_OTHER): Payer: Medicare Other | Admitting: *Deleted

## 2023-09-23 DIAGNOSIS — Z0000 Encounter for general adult medical examination without abnormal findings: Secondary | ICD-10-CM

## 2023-09-23 NOTE — Progress Notes (Signed)
Subjective:   Cynthia Young is a 89 y.o. female who presents for Medicare Annual (Subsequent) preventive examination.  Visit Complete: Virtual I connected with  Lorenza Chick on 09/23/23 by a audio enabled telemedicine application and verified that I am speaking with the correct person using two identifiers.  Patient Location: Home  Provider Location: Office/Clinic  I discussed the limitations of evaluation and management by telemedicine. The patient expressed understanding and agreed to proceed.  Vital Signs: Because this visit was a virtual/telehealth visit, some criteria may be missing or patient reported. Any vitals not documented were not able to be obtained and vitals that have been documented are patient reported.   Cardiac Risk Factors include: advanced age (>44men, >77 women);dyslipidemia     Objective:    There were no vitals filed for this visit. There is no height or weight on file to calculate BMI.     09/23/2023   10:41 AM 08/25/2022    2:00 PM 08/05/2021    9:06 AM 01/14/2021    2:15 AM 07/18/2020    4:25 PM 07/12/2020    1:37 PM 06/09/2020    8:36 AM  Advanced Directives  Does Patient Have a Medical Advance Directive? Yes Yes No No No Yes Yes  Type of Estate agent of [place];Living will Healthcare Power of [place];Living will   Living will;Healthcare Power of [employer] Power of [place];Living will   Does patient want to make changes to medical advance directive? No - Patient declined      No - Patient declined  Copy of Healthcare Power of Attorney in Chart? No - copy requested No - copy requested         Current Medications (verified) Outpatient Encounter Medications as of 09/23/2023  Medication Sig   Biotin 1 MG CAPS Take by mouth.   famotidine (PEPCID) 20 MG tablet Take 1 tablet (20 mg total) by mouth daily.   levothyroxine (SYNTHROID) 50 MCG tablet TAKE ONE TABLET BY MOUTH ONE TIME DAILY   Magnesium 250 MG TABS Take 1 tablet  (250 mg total) by mouth 2 (two) times daily.   nystatin cream (MYCOSTATIN) Apply 1 Application topically 2 (two) times daily as needed for dry skin.   No facility-administered encounter medications on file as of 09/23/2023.    Allergies (verified) Patient has no known allergies.   History: Past Medical History:  Diagnosis Date   Arthritis    Hyperglycemia 09/04/2020   Hyperlipidemia    Hypothyroid    Past Surgical History:  Procedure Laterality Date   BACK SURGERY     BIOPSY  04/12/2020   Procedure: BIOPSY;  Surgeon: Meridee Score Netty Starring., MD;  Location: [facility];  Service: Gastroenterology;;   Arbutus Leas Left    CHOLECYSTECTOMY N/A 04/11/2020   Procedure: LAPAROSCOPIC CHOLECYSTECTOMY WITH INTRAOPERATIVE CHOLANGIOGRAM;  Surgeon: Ovidio Kin, MD;  Location: [facility];  Service: General;  Laterality: N/A;   ERCP N/A 04/12/2020   Procedure: ENDOSCOPIC RETROGRADE CHOLANGIOPANCREATOGRAPHY (ERCP);  Surgeon: Lemar Lofty., MD;  Location: [facility];  Service: Gastroenterology;  Laterality: N/A;   FOOT SURGERY Right    bunion   REMOVAL OF STONES  04/12/2020   Procedure: REMOVAL OF STONES;  Surgeon: Meridee Score Netty Starring., MD;  Location: [facility];  Service: Gastroenterology;;   Dennison Mascot  04/12/2020   Procedure: Dennison Mascot;  Surgeon: Mansouraty, Netty Starring., MD;  Location: [facility];  Service: Gastroenterology;;   Family History  Problem Relation Age of Onset   Cancer Mother    Heart  disease Father    Arthritis Father        rheumatoid   Cancer Brother        colon   Colon cancer Brother    Esophageal cancer Neg Hx    Inflammatory bowel disease Neg Hx    Liver disease Neg Hx    Pancreatic cancer Neg Hx    Stomach cancer Neg Hx    Social History   Socioeconomic History   Marital status: Widowed    Spouse name: Not on file   Number of children: Not on file   Years of education: Not on file   Highest education level: Not on file  Occupational  History   Not on file  Tobacco Use   Smoking status: Never   Smokeless tobacco: Never  Vaping Use   Vaping status: Never Used  Substance and Sexual Activity   Alcohol use: Yes    Comment: socially   Drug use: Never   Sexual activity: Not Currently  Other Topics Concern   Not on file  Social History Narrative   worked in [employer] office, short time. No cigarettes or drug use. Live by self no dietary restictions and walks daily   Social Determinants of Health   Financial Resource Strain: Low Risk  (09/23/2023)   Overall Financial Resource Strain (CARDIA)    Difficulty of Paying Living Expenses: Not hard at all  Food Insecurity: No Food Insecurity (09/23/2023)   Hunger Vital Sign    Worried About Running Out of Food in the Last Year: Never true    Ran Out of Food in the Last Year: Never true  Transportation Needs: No Transportation Needs (09/23/2023)   PRAPARE - Administrator, Civil Service (Medical): No    Lack of Transportation (Non-Medical): No  Physical Activity: Inactive (09/23/2023)   Exercise Vital Sign    Days of Exercise per Week: 0 days    Minutes of Exercise per Session: 0 min  Stress: No Stress Concern Present (09/23/2023)   [employer] of Occupational Health - Occupational Stress Questionnaire    Feeling of Stress : Not at all  Social Connections: Socially Isolated (09/23/2023)   Social Connection and Isolation Panel [NHANES]    Frequency of Communication with Friends and Family: Once a week    Frequency of Social Gatherings with Friends and Family: Once a week    Attends Religious Services: Never    Database administrator or Organizations: No    Attends Banker Meetings: Never    Marital Status: Widowed    Tobacco Counseling Counseling given: Not Answered   Clinical Intake:  Pre-visit preparation completed: Yes  Pain : No/denies pain  Nutritional Risks: None Diabetes: No  How often do you need to have someone help you when  you read instructions, pamphlets, or other written materials from your doctor or pharmacy?: 1 - Never  Interpreter Needed?: No  Information entered by :: Donne Anon, CMA   Activities of Daily Living    09/23/2023   10:24 AM  In your present state of health, do you have any difficulty performing the following activities:  Hearing? 1  Vision? 0  Difficulty concentrating or making decisions? 1  Walking or climbing stairs? 1  Dressing or bathing? 0  Doing errands, shopping? 0  Comment doesn't drive at night  Preparing Food and eating ? N  Using the Toilet? N  In the past six months, have you accidently leaked urine? Y  Do  you have problems with loss of bowel control? Y  Managing your Medications? N  Managing your Finances? N  Housekeeping or managing your Housekeeping? N    Patient Care Team: Bradd Canary, MD as PCP - General (Family Medicine) O'Neal, Ronnald Ramp, MD as PCP - Cardiology (Cardiology) Henrene Pastor, RPH-CPP (Pharmacist)  Indicate any recent Medical Services you may have received from other than [facility] providers in the past year (date may be approximate).     Assessment:   This is a routine wellness examination for [facility].  Hearing/Vision screen No results found.   Goals Addressed   None    Depression Screen    09/23/2023   10:47 AM 07/28/2023    4:04 PM 05/02/2023    2:27 PM 08/25/2022    2:02 PM 07/13/2022   10:25 AM 08/05/2021    9:10 AM 06/12/2021   11:03 AM  PHQ 2/9 Scores  PHQ - 2 Score 0 0 0 0 0 0 0  PHQ- 9 Score   0  1      Fall Risk    09/23/2023   10:32 AM 07/28/2023    4:03 PM 05/02/2023    2:27 PM 08/25/2022    2:00 PM 07/13/2022   10:25 AM  Fall Risk   Falls in the past year? 0 0 0 0 0  Number falls in past yr: 0 0 0 0 0  Injury with Fall? 0 0 0 0 0  Risk for fall due to : No Fall Risks   No Fall Risks   Follow up Falls evaluation completed Falls evaluation completed Falls evaluation completed Falls evaluation completed      MEDICARE RISK AT HOME: Medicare Risk at Home Any stairs in or around the home?: No If so, are there any without handrails?: No Home free of loose throw rugs in walkways, pet beds, electrical cords, etc?: Yes Adequate lighting in your home to reduce risk of falls?: Yes Life alert?: No Use of a cane, walker or w/c?: No Grab bars in the bathroom?: No Shower chair or bench in shower?: No Elevated toilet seat or a handicapped toilet?: No  TIMED UP AND GO:  Was the test performed?  No    Cognitive Function:        09/23/2023   10:49 AM 08/25/2022    2:22 PM  6CIT Screen  What Year? 0 points 0 points  What month? 0 points 0 points  What time? 0 points 0 points  Count back from 20 0 points 0 points  Months in reverse 0 points 0 points  Repeat phrase 4 points 0 points  Total Score 4 points 0 points    Immunizations Immunization History  Administered Date(s) Administered   Fluad Quad(high Dose 65+) 09/04/2020, 12/17/2021   Moderna Sars-Covid-2 Vaccination 01/14/2020, 02/22/2020   PNEUMOCOCCAL CONJUGATE-20 06/12/2021    TDAP status: Due, Education has been provided regarding the importance of this vaccine. Advised may receive this vaccine at local pharmacy or Health Dept. Aware to provide a copy of the vaccination record if obtained from local pharmacy or Health Dept. Verbalized acceptance and understanding.  Flu Vaccine status: Due, Education has been provided regarding the importance of this vaccine. Advised may receive this vaccine at local pharmacy or Health Dept. Aware to provide a copy of the vaccination record if obtained from local pharmacy or Health Dept. Verbalized acceptance and understanding.  Pneumococcal vaccine status: Up to date  Covid-19 vaccine status: Information provided on how to obtain  vaccines.   Qualifies for Shingles Vaccine? Yes   Zostavax completed No   Shingrix Completed?: No.    Education has been provided regarding the importance of this  vaccine. Patient has been advised to call insurance company to determine out of pocket expense if they have not yet received this vaccine. Advised may also receive vaccine at local pharmacy or Health Dept. Verbalized acceptance and understanding.  Screening Tests Health Maintenance  Topic Date Due   DTaP/Tdap/Td (1 - Tdap) Never done   Medicare Annual Wellness (AWV)  08/26/2023   INFLUENZA VACCINE  02/13/2024 (Originally 06/16/2023)   Pneumonia Vaccine 9+ Years old  Completed   DEXA SCAN  Completed   HPV VACCINES  Aged Out   COVID-19 Vaccine  Discontinued   Zoster Vaccines- Shingrix  Discontinued    Health Maintenance  Health Maintenance Due  Topic Date Due   DTaP/Tdap/Td (1 - Tdap) Never done   Medicare Annual Wellness (AWV)  08/26/2023    Colorectal cancer screening: No longer required.   Mammogram status: No longer required due to age.  Bone Density status: Completed 09/09/22. Results reflect: Bone density results: OSTEOPOROSIS. Repeat every 2 years.  Lung Cancer Screening: (Low Dose CT Chest recommended if Age 54-80 years, 20 pack-year currently smoking OR have quit w/in 15years.) does not qualify.   Additional Screening:  Hepatitis C Screening: does not qualify  Vision Screening: Recommended annual ophthalmology exams for early detection of glaucoma and other disorders of the eye. Is the patient up to date with their annual eye exam?  Yes  Who is the provider or what is the name of the office in which the patient attends annual eye exams? [facility] If pt is not established with a provider, would they like to be referred to a provider to establish care? No .   Dental Screening: Recommended annual dental exams for proper oral hygiene  Diabetic Foot Exam: N/a  Community Resource Referral / Chronic Care Management: CRR required this visit?  No   CCM required this visit?  No     Plan:     I have personally reviewed and noted the following in the patient's chart:    Medical and social history Use of alcohol, tobacco or illicit drugs  Current medications and supplements including opioid prescriptions. Patient is not currently taking opioid prescriptions. Functional ability and status Nutritional status Physical activity Advanced directives List of other physicians Hospitalizations, surgeries, and ER visits in previous 12 months Vitals Screenings to include cognitive, depression, and falls Referrals and appointments  In addition, I have reviewed and discussed with patient certain preventive protocols, quality metrics, and best practice recommendations. A written personalized care plan for preventive services as well as general preventive health recommendations were provided to patient.     Donne Anon, CMA   09/23/2023   After Visit Summary: (MyChart) Due to this being a telephonic visit, the after visit summary with patients personalized plan was offered to patient via MyChart   Nurse Notes: None

## 2023-09-23 NOTE — Patient Instructions (Signed)
Cynthia Young , Thank you for taking time to come for your Medicare Wellness Visit. I appreciate your ongoing commitment to your health goals. Please review the following plan we discussed and let me know if I can assist you in the future.     This is a list of the screening recommended for you and due dates:  Health Maintenance  Topic Date Due   DTaP/Tdap/Td vaccine (1 - Tdap) Never done   Flu Shot  02/13/2024*   Medicare Annual Wellness Visit  09/22/2024   Pneumonia Vaccine  Completed   DEXA scan (bone density measurement)  Completed   HPV Vaccine  Aged Out   COVID-19 Vaccine  Discontinued   Zoster (Shingles) Vaccine  Discontinued  *Topic was postponed. The date shown is not the original due date.    Next appointment: Follow up in one year for your annual wellness visit.   Preventive Care 87 Years and Older, Female Preventive care refers to lifestyle choices and visits with your health care provider that can promote health and wellness. What does preventive care include? A yearly physical exam. This is also called an annual well check. Dental exams once or twice a year. Routine eye exams. Ask your health care provider how often you should have your eyes checked. Personal lifestyle choices, including: Daily care of your teeth and gums. Regular physical activity. Eating a healthy diet. Avoiding tobacco and drug use. Limiting alcohol use. Practicing safe sex. Taking low-dose aspirin every day. Taking vitamin and mineral supplements as recommended by your health care provider. What happens during an annual well check? The services and screenings done by your health care provider during your annual well check will depend on your age, overall health, lifestyle risk factors, and family history of disease. Counseling  Your health care provider may ask you questions about your: Alcohol use. Tobacco use. Drug use. Emotional well-being. Home and relationship well-being. Sexual  activity. Eating habits. History of falls. Memory and ability to understand (cognition). Work and work Astronomer. Reproductive health. Screening  You may have the following tests or measurements: Height, weight, and BMI. Blood pressure. Lipid and cholesterol levels. These may be checked every 5 years, or more frequently if you are over 35 years old. Skin check. Lung cancer screening. You may have this screening every year starting at age 49 if you have a 30-pack-year history of smoking and currently smoke or have quit within the past 15 years. Fecal occult blood test (FOBT) of the stool. You may have this test every year starting at age 71. Flexible sigmoidoscopy or colonoscopy. You may have a sigmoidoscopy every 5 years or a colonoscopy every 10 years starting at age 69. Hepatitis C blood test. Hepatitis B blood test. Sexually transmitted disease (STD) testing. Diabetes screening. This is done by checking your blood sugar (glucose) after you have not eaten for a while (fasting). You may have this done every 1-3 years. Bone density scan. This is done to screen for osteoporosis. You may have this done starting at age 39. Mammogram. This may be done every 1-2 years. Talk to your health care provider about how often you should have regular mammograms. Talk with your health care provider about your test results, treatment options, and if necessary, the need for more tests. Vaccines  Your health care provider may recommend certain vaccines, such as: Influenza vaccine. This is recommended every year. Tetanus, diphtheria, and acellular pertussis (Tdap, Td) vaccine. You may need a Td booster every 10 years. Zoster  vaccine. You may need this after age 28. Pneumococcal 13-valent conjugate (PCV13) vaccine. One dose is recommended after age 41. Pneumococcal polysaccharide (PPSV23) vaccine. One dose is recommended after age 80. Talk to your health care provider about which screenings and vaccines  you need and how often you need them. This information is not intended to replace advice given to you by your health care provider. Make sure you discuss any questions you have with your health care provider. Document Released: 11/28/2015 Document Revised: 07/21/2016 Document Reviewed: 09/02/2015 Elsevier Interactive Patient Education  2017 [employer].  Fall Prevention in the Home Falls can cause injuries. They can happen to people of all ages. There are many things you can do to make your home safe and to help prevent falls. What can I do on the outside of my home? Regularly fix the edges of walkways and driveways and fix any cracks. Remove anything that might make you trip as you walk through a door, such as a raised step or threshold. Trim any bushes or trees on the path to your home. Use bright outdoor lighting. Clear any walking paths of anything that might make someone trip, such as rocks or tools. Regularly check to see if handrails are loose or broken. Make sure that both sides of any steps have handrails. Any raised decks and porches should have guardrails on the edges. Have any leaves, snow, or ice cleared regularly. Use sand or salt on walking paths during winter. Clean up any spills in your garage right away. This includes oil or grease spills. What can I do in the bathroom? Use night lights. Install grab bars by the toilet and in the tub and shower. Do not use towel bars as grab bars. Use non-skid mats or decals in the tub or shower. If you need to sit down in the shower, use a plastic, non-slip stool. Keep the floor dry. Clean up any water that spills on the floor as soon as it happens. Remove soap buildup in the tub or shower regularly. Attach bath mats securely with double-sided non-slip rug tape. Do not have throw rugs and other things on the floor that can make you trip. What can I do in the bedroom? Use night lights. Make sure that you have a light by your bed that  is easy to reach. Do not use any sheets or blankets that are too big for your bed. They should not hang down onto the floor. Have a firm chair that has side arms. You can use this for support while you get dressed. Do not have throw rugs and other things on the floor that can make you trip. What can I do in the kitchen? Clean up any spills right away. Avoid walking on wet floors. Keep items that you use a lot in easy-to-reach places. If you need to reach something above you, use a strong step stool that has a grab bar. Keep electrical cords out of the way. Do not use floor polish or wax that makes floors slippery. If you must use wax, use non-skid floor wax. Do not have throw rugs and other things on the floor that can make you trip. What can I do with my stairs? Do not leave any items on the stairs. Make sure that there are handrails on both sides of the stairs and use them. Fix handrails that are broken or loose. Make sure that handrails are as long as the stairways. Check any carpeting to make sure that it  is firmly attached to the stairs. Fix any carpet that is loose or worn. Avoid having throw rugs at the top or bottom of the stairs. If you do have throw rugs, attach them to the floor with carpet tape. Make sure that you have a light switch at the top of the stairs and the bottom of the stairs. If you do not have them, ask someone to add them for you. What else can I do to help prevent falls? Wear shoes that: Do not have high heels. Have rubber bottoms. Are comfortable and fit you well. Are closed at the toe. Do not wear sandals. If you use a stepladder: Make sure that it is fully opened. Do not climb a closed stepladder. Make sure that both sides of the stepladder are locked into place. Ask someone to hold it for you, if possible. Clearly mark and make sure that you can see: Any grab bars or handrails. First and last steps. Where the edge of each step is. Use tools that help you  move around (mobility aids) if they are needed. These include: Canes. Walkers. Scooters. Crutches. Turn on the lights when you go into a dark area. Replace any light bulbs as soon as they burn out. Set up your furniture so you have a clear path. Avoid moving your furniture around. If any of your floors are uneven, fix them. If there are any pets around you, be aware of where they are. Review your medicines with your doctor. Some medicines can make you feel dizzy. This can increase your chance of falling. Ask your doctor what other things that you can do to help prevent falls. This information is not intended to replace advice given to you by your health care provider. Make sure you discuss any questions you have with your health care provider. Document Released: 08/28/2009 Document Revised: 04/08/2016 Document Reviewed: 12/06/2014 Elsevier Interactive Patient Education  2017 [employer].

## 2023-09-28 ENCOUNTER — Telehealth: Payer: Self-pay | Admitting: Family Medicine

## 2023-09-28 NOTE — Telephone Encounter (Signed)
Rx sent 

## 2023-09-28 NOTE — Telephone Encounter (Signed)
Pt called and stated that [facility] is needing approval from PCP to refill her medication, Levothyroxine. Please advise.

## 2023-09-30 ENCOUNTER — Telehealth: Payer: Self-pay | Admitting: Family Medicine

## 2023-09-30 NOTE — Telephone Encounter (Signed)
Pt called to advise that she has a migraine (no nausea or blurred vision). She wants to know what she can take for relief. Please call pt to advise. Tylenol has not helped.

## 2023-10-03 NOTE — Telephone Encounter (Signed)
Called to check on patient this morning. She said he her headache is almost gone and she is doing better. She continued taking Tylenol.

## 2023-10-21 ENCOUNTER — Emergency Department (HOSPITAL_BASED_OUTPATIENT_CLINIC_OR_DEPARTMENT_OTHER): Payer: Medicare Other

## 2023-10-21 ENCOUNTER — Encounter: Payer: Self-pay | Admitting: Family Medicine

## 2023-10-21 ENCOUNTER — Other Ambulatory Visit (HOSPITAL_COMMUNITY)
Admission: RE | Admit: 2023-10-21 | Discharge: 2023-10-21 | Disposition: A | Discharge status: Home / Self Care | Payer: Medicare Other | Source: Ambulatory Visit

## 2023-10-21 ENCOUNTER — Encounter (HOSPITAL_BASED_OUTPATIENT_CLINIC_OR_DEPARTMENT_OTHER): Payer: Self-pay | Admitting: Emergency Medicine

## 2023-10-21 ENCOUNTER — Inpatient Hospital Stay (HOSPITAL_BASED_OUTPATIENT_CLINIC_OR_DEPARTMENT_OTHER)
Admission: EM | Admit: 2023-10-21 | Discharge: 2023-10-24 | DRG: 310 | Disposition: A | Discharge status: Home / Self Care | Payer: Medicare Other | Attending: Internal Medicine | Admitting: Internal Medicine

## 2023-10-21 ENCOUNTER — Ambulatory Visit (INDEPENDENT_AMBULATORY_CARE_PROVIDER_SITE_OTHER): Payer: Medicare Other | Admitting: Family Medicine

## 2023-10-21 ENCOUNTER — Other Ambulatory Visit: Payer: Self-pay

## 2023-10-21 VITALS — BP 84/50 | HR 31 | Ht 59.0 in | Wt 110.0 lb

## 2023-10-21 DIAGNOSIS — R001 Bradycardia, unspecified: Principal | ICD-10-CM | POA: Diagnosis present

## 2023-10-21 DIAGNOSIS — Z8249 Family history of ischemic heart disease and other diseases of the circulatory system: Secondary | ICD-10-CM | POA: Diagnosis not present

## 2023-10-21 DIAGNOSIS — I441 Atrioventricular block, second degree: Secondary | ICD-10-CM | POA: Diagnosis not present

## 2023-10-21 DIAGNOSIS — Z79899 Other long term (current) drug therapy: Secondary | ICD-10-CM

## 2023-10-21 DIAGNOSIS — I350 Nonrheumatic aortic (valve) stenosis: Secondary | ICD-10-CM | POA: Diagnosis not present

## 2023-10-21 DIAGNOSIS — I10 Essential (primary) hypertension: Secondary | ICD-10-CM | POA: Diagnosis not present

## 2023-10-21 DIAGNOSIS — E875 Hyperkalemia: Secondary | ICD-10-CM | POA: Diagnosis present

## 2023-10-21 DIAGNOSIS — E039 Hypothyroidism, unspecified: Secondary | ICD-10-CM

## 2023-10-21 DIAGNOSIS — E785 Hyperlipidemia, unspecified: Secondary | ICD-10-CM | POA: Diagnosis present

## 2023-10-21 DIAGNOSIS — Z9049 Acquired absence of other specified parts of digestive tract: Secondary | ICD-10-CM

## 2023-10-21 DIAGNOSIS — I8393 Asymptomatic varicose veins of bilateral lower extremities: Secondary | ICD-10-CM | POA: Diagnosis not present

## 2023-10-21 DIAGNOSIS — Z66 Do not resuscitate: Secondary | ICD-10-CM | POA: Diagnosis not present

## 2023-10-21 DIAGNOSIS — R0789 Other chest pain: Secondary | ICD-10-CM

## 2023-10-21 DIAGNOSIS — Z79890 Hormone replacement therapy (postmenopausal): Secondary | ICD-10-CM

## 2023-10-21 DIAGNOSIS — N898 Other specified noninflammatory disorders of vagina: Secondary | ICD-10-CM | POA: Diagnosis not present

## 2023-10-21 DIAGNOSIS — I700 Atherosclerosis of aorta: Secondary | ICD-10-CM | POA: Diagnosis not present

## 2023-10-21 DIAGNOSIS — R079 Chest pain, unspecified: Secondary | ICD-10-CM | POA: Diagnosis not present

## 2023-10-21 DIAGNOSIS — R102 Pelvic and perineal pain: Secondary | ICD-10-CM

## 2023-10-21 DIAGNOSIS — R9431 Abnormal electrocardiogram [ECG] [EKG]: Secondary | ICD-10-CM | POA: Diagnosis not present

## 2023-10-21 LAB — CBC WITH DIFFERENTIAL/PLATELET
Abs Immature Granulocytes: 0.03 10*3/uL (ref 0.00–0.07)
Basophils Absolute: 0.1 10*3/uL (ref 0.0–0.1)
Basophils Relative: 1 %
Eosinophils Absolute: 0.1 10*3/uL (ref 0.0–0.5)
Eosinophils Relative: 1 %
HCT: 43.4 % (ref 36.0–46.0)
Hemoglobin: 13.9 g/dL (ref 12.0–15.0)
Immature Granulocytes: 0 %
Lymphocytes Relative: 30 %
Lymphs Abs: 2.7 10*3/uL (ref 0.7–4.0)
MCH: 30.1 pg (ref 26.0–34.0)
MCHC: 32 g/dL (ref 30.0–36.0)
MCV: 93.9 fL (ref 80.0–100.0)
Monocytes Absolute: 0.8 10*3/uL (ref 0.1–1.0)
Monocytes Relative: 8 %
Neutro Abs: 5.4 10*3/uL (ref 1.7–7.7)
Neutrophils Relative %: 60 %
Platelets: 256 10*3/uL (ref 150–400)
RBC: 4.62 MIL/uL (ref 3.87–5.11)
RDW: 13.7 % (ref 11.5–15.5)
WBC: 9.1 10*3/uL (ref 4.0–10.5)
nRBC: 0 % (ref 0.0–0.2)

## 2023-10-21 LAB — COMPREHENSIVE METABOLIC PANEL
ALT: 12 U/L (ref 0–44)
AST: 21 U/L (ref 15–41)
Albumin: 4 g/dL (ref 3.5–5.0)
Alkaline Phosphatase: 60 U/L (ref 38–126)
Anion gap: 9 (ref 5–15)
BUN: 19 mg/dL (ref 8–23)
CO2: 24 mmol/L (ref 22–32)
Calcium: 9.4 mg/dL (ref 8.9–10.3)
Chloride: 105 mmol/L (ref 98–111)
Creatinine, Ser: 0.84 mg/dL (ref 0.44–1.00)
GFR, Estimated: >60 mL/min (ref >60)
Glucose, Bld: 97 mg/dL (ref 70–99)
Potassium: 4.7 mmol/L (ref 3.5–5.1)
Sodium: 138 mmol/L (ref 135–145)
Total Bilirubin: 0.8 mg/dL (ref <1.2)
Total Protein: 7.1 g/dL (ref 6.5–8.1)

## 2023-10-21 LAB — POC URINALSYSI DIPSTICK (AUTOMATED)
Bilirubin, UA: NEGATIVE
Blood, UA: NEGATIVE
Glucose, UA: NEGATIVE
Ketones, UA: NEGATIVE
Leukocytes, UA: NEGATIVE
Nitrite, UA: NEGATIVE
Protein, UA: NEGATIVE
Spec Grav, UA: <=1.005 — AB (ref 1.010–1.025)
Urobilinogen, UA: 0.2 U/dL
pH, UA: 5 (ref 5.0–8.0)

## 2023-10-21 LAB — URINALYSIS, W/ REFLEX TO CULTURE (INFECTION SUSPECTED)
Bacteria, UA: NONE SEEN
Bilirubin Urine: NEGATIVE
Glucose, UA: NEGATIVE mg/dL
Hgb urine dipstick: NEGATIVE
Ketones, ur: NEGATIVE mg/dL
Leukocytes,Ua: NEGATIVE
Nitrite: NEGATIVE
Protein, ur: NEGATIVE mg/dL
RBC / HPF: NONE SEEN RBC/hpf (ref 0–5)
Specific Gravity, Urine: 1.01 (ref 1.005–1.030)
Squamous Epithelial / HPF: NONE SEEN /[HPF] (ref 0–5)
pH: 6 (ref 5.0–8.0)

## 2023-10-21 LAB — LIPASE, BLOOD: Lipase: 28 U/L (ref 11–51)

## 2023-10-21 LAB — TROPONIN I (HIGH SENSITIVITY)
Troponin I (High Sensitivity): 15 ng/L (ref <18)
Troponin I (High Sensitivity): 18 ng/L — ABNORMAL HIGH (ref <18)

## 2023-10-21 LAB — LACTIC ACID, PLASMA: Lactic Acid, Venous: 1.3 mmol/L (ref 0.5–1.9)

## 2023-10-21 MED ORDER — ACETAMINOPHEN 325 MG PO TABS
650.0000 mg | ORAL_TABLET | Freq: Four times a day (QID) | ORAL | Status: DC | PRN
  Administered 2023-10-22: 650 mg via ORAL
  Filled 2023-10-21: qty 2

## 2023-10-21 MED ORDER — LEVOTHYROXINE SODIUM 50 MCG PO TABS
50.0000 ug | ORAL_TABLET | Freq: Every day | ORAL | Status: DC
  Administered 2023-10-22 – 2023-10-24 (×3): 50 ug via ORAL
  Filled 2023-10-21 (×3): qty 1

## 2023-10-21 MED ORDER — HYDRALAZINE HCL 20 MG/ML IJ SOLN
5.0000 mg | Freq: Four times a day (QID) | INTRAMUSCULAR | Status: DC | PRN
  Administered 2023-10-21: 5 mg via INTRAVENOUS
  Filled 2023-10-21: qty 1

## 2023-10-21 MED ORDER — ACETAMINOPHEN 650 MG RE SUPP: 650.0000 mg | Freq: Four times a day (QID) | RECTAL | Status: DC | PRN

## 2023-10-21 MED ORDER — HYDRALAZINE HCL 20 MG/ML IJ SOLN
5.0000 mg | Freq: Once | INTRAMUSCULAR | Status: AC
  Administered 2023-10-21: 5 mg via INTRAVENOUS
  Filled 2023-10-21: qty 1

## 2023-10-21 NOTE — ED Notes (Signed)
Assisted patient with bedpan. Large amount of urine output. Pericare performed by patient. Patient tolerated well.

## 2023-10-21 NOTE — H&P (Incomplete)
History and Physical    Cynthia Young ZOX:[id number] DOB: [birth year] DOA: 10/21/2023  PCP: Bradd Canary, MD  Patient coming from: [facility] ED  Chief Complaint: Bradycardia  HPI: Cynthia Young is a 89 y.o. female with medical history significant of Mobitz type I Wenckebach AV block/symptomatic bradycardia previously evaluated by EP and pacemaker implantation was deferred, hyperlipidemia, hypothyroidism presented to ED from PCPs office due to concern for bradycardia, chest pain, and hypotension. Heart rate 28-40 at PCPs office and blood pressure 84/44.    In the ED, patient noted to be hypertensive with SBP 180-200.  EKG done in the ED concerning for Mobitz type II AV block with heart rate 27. Troponin 15> 18.  Chest x-ray showing no acute cardiopulmonary process.  EDP consulted cardiology (Dr. Cristal Deer) and patient sent to [facility] for EP evaluation.  Patient was given IV hydralazine 5 mg in the ED.  Patient states she went to see her PCP this morning as she was having left-sided chest discomfort.  Also reporting dizziness/lightheadedness and fatigue.  Chest discomfort has persisted throughout the day.  Patient states her cardiologist had spoken to her about her low heart rate in the past and at that time decision was made not to implant a pacemaker.  She has no other complaints.  Review of Systems:  Review of Systems  All other systems reviewed and are negative.   Past Medical History:  Diagnosis Date  . Arthritis   . Hyperglycemia 09/04/2020  . Hyperlipidemia   . Hypothyroid     Past Surgical History:  Procedure Laterality Date  . BACK SURGERY    . BIOPSY  04/12/2020   Procedure: BIOPSY;  Surgeon: Meridee Score Netty Starring., MD;  Location: [facility];  Service: Gastroenterology;;  . Arbutus Leas Left   . CHOLECYSTECTOMY N/A 04/11/2020   Procedure: LAPAROSCOPIC CHOLECYSTECTOMY WITH INTRAOPERATIVE CHOLANGIOGRAM;  Surgeon: Ovidio Kin, MD;  Location: [facility];  Service:  General;  Laterality: N/A;  . ERCP N/A 04/12/2020   Procedure: ENDOSCOPIC RETROGRADE CHOLANGIOPANCREATOGRAPHY (ERCP);  Surgeon: Lemar Lofty., MD;  Location: [facility];  Service: Gastroenterology;  Laterality: N/A;  . FOOT SURGERY Right    bunion  . REMOVAL OF STONES  04/12/2020   Procedure: REMOVAL OF STONES;  Surgeon: Meridee Score Netty Starring., MD;  Location: [facility];  Service: Gastroenterology;;  . Dennison Mascot  04/12/2020   Procedure: Dennison Mascot;  Surgeon: Mansouraty, Netty Starring., MD;  Location: [facility];  Service: Gastroenterology;;     reports that she has never smoked. She has never used smokeless tobacco. She reports current alcohol use. She reports that she does not use drugs.  No Known Allergies  Family History  Problem Relation Age of Onset  . Cancer Mother   . Heart disease Father   . Arthritis Father        rheumatoid  . Cancer Brother        colon  . Colon cancer Brother   . Esophageal cancer Neg Hx   . Inflammatory bowel disease Neg Hx   . Liver disease Neg Hx   . Pancreatic cancer Neg Hx   . Stomach cancer Neg Hx     Prior to Admission medications   Medication Sig Start Date End Date Taking? Authorizing Provider  famotidine (PEPCID) 20 MG tablet Take 1 tablet (20 mg total) by mouth daily. Patient taking differently: Take 20 mg by mouth as needed for heartburn or indigestion. 07/27/22  Yes Meredith Pel, NP  levothyroxine (SYNTHROID) 50 MCG tablet TAKE ONE  TABLET BY MOUTH ONE TIME DAILY Patient taking differently: Take 50 mcg by mouth daily. 09/28/23  Yes Bradd Canary, MD  Magnesium 250 MG TABS Take 1 tablet (250 mg total) by mouth 2 (two) times daily. 06/09/23  Yes Bradd Canary, MD    Physical Exam: Vitals:   10/21/23 1800 10/21/23 1815 10/21/23 1830 10/21/23 2027  BP: (!) 163/54 (!) 175/61 (!) 181/62 (!) 184/72  Pulse: (!) 32 (!) 44 (!) 52 (!) 37  Resp: 16 17 20 18   Temp:    98.4 F (36.9 C)  TempSrc:    Oral  SpO2: 97%  97% 97% 98%  Weight:    49.1 kg  Height:    4\' 11"  (1.499 m)    Physical Exam Vitals reviewed.  Constitutional:      General: She is not in acute distress. HENT:     Head: Normocephalic and atraumatic.  Eyes:     Extraocular Movements: Extraocular movements intact.  Cardiovascular:     Rate and Rhythm: Regular rhythm. Bradycardia present.     Pulses: Normal pulses.  Pulmonary:     Effort: Pulmonary effort is normal. No respiratory distress.     Breath sounds: Normal breath sounds. No wheezing or rales.  Abdominal:     General: Bowel sounds are normal. There is no distension.     Palpations: Abdomen is soft.     Tenderness: There is no abdominal tenderness. There is no guarding.  Musculoskeletal:     Cervical back: Normal range of motion.     Right lower leg: No edema.     Left lower leg: No edema.  Skin:    General: Skin is warm and dry.  Neurological:     General: No focal deficit present.     Mental Status: She is alert and oriented to person, place, and time.     Labs on Admission: I have personally reviewed following labs and imaging studies  CBC: Recent Labs  Lab 10/21/23 1214  WBC 9.1  NEUTROABS 5.4  HGB 13.9  HCT 43.4  MCV 93.9  PLT 256   Basic Metabolic Panel: Recent Labs  Lab 10/21/23 1214  NA 138  K 4.7  CL 105  CO2 24  GLUCOSE 97  BUN 19  CREATININE 0.84  CALCIUM 9.4   GFR: Estimated Creatinine Clearance: 29.8 mL/min (by C-G formula based on SCr of 0.84 mg/dL). Liver Function Tests: Recent Labs  Lab 10/21/23 1214  AST 21  ALT 12  ALKPHOS 60  BILITOT 0.8  PROT 7.1  ALBUMIN 4.0   Recent Labs  Lab 10/21/23 1214  LIPASE 28   No results for input(s): "AMMONIA" in the last 168 hours. Coagulation Profile: No results for input(s): "INR", "PROTIME" in the last 168 hours. Cardiac Enzymes: No results for input(s): "CKTOTAL", "CKMB", "CKMBINDEX", "TROPONINI" in the last 168 hours. BNP (last 3 results) No results for input(s):  "PROBNP" in the last 8760 hours. HbA1C: No results for input(s): "HGBA1C" in the last 72 hours. CBG: No results for input(s): "GLUCAP" in the last 168 hours. Lipid Profile: No results for input(s): "CHOL", "HDL", "LDLCALC", "TRIG", "CHOLHDL", "LDLDIRECT" in the last 72 hours. Thyroid Function Tests: No results for input(s): "TSH", "T4TOTAL", "FREET4", "T3FREE", "THYROIDAB" in the last 72 hours. Anemia Panel: No results for input(s): "VITAMINB12", "FOLATE", "FERRITIN", "TIBC", "IRON", "RETICCTPCT" in the last 72 hours. Urine analysis:    Component Value Date/Time   COLORURINE STRAW (A) 10/21/2023 1540   APPEARANCEUR CLEAR 10/21/2023 1540  LABSPEC 1.010 10/21/2023 1540   PHURINE 6.0 10/21/2023 1540   GLUCOSEU NEGATIVE 10/21/2023 1540   HGBUR NEGATIVE 10/21/2023 1540   BILIRUBINUR NEGATIVE 10/21/2023 1540   BILIRUBINUR negative 10/21/2023 1111   KETONESUR NEGATIVE 10/21/2023 1540   PROTEINUR NEGATIVE 10/21/2023 1540   PROTEINUR Negative 10/21/2023 1111   UROBILINOGEN 0.2 10/21/2023 1111   NITRITE NEGATIVE 10/21/2023 1540   NITRITE negative 10/21/2023 1111   LEUKOCYTESUR NEGATIVE 10/21/2023 1540   LEUKOCYTESUR Negative 10/21/2023 1111    Radiological Exams on Admission: DG Chest Portable 1 View  Result Date: 10/21/2023 CLINICAL DATA:  cp EXAM: PORTABLE CHEST - 1 VIEW COMPARISON:  04/09/2020. FINDINGS: Cardiac silhouette is unremarkable. No pneumothorax or pleural effusion. The lungs are clear. Aorta is calcified. The visualized skeletal structures are unremarkable. IMPRESSION: No acute cardiopulmonary process. Electronically Signed   By: Layla Maw M.D.   On: 10/21/2023 14:51    Assessment and Plan  Mobitz type II AV block/symptomatic bradycardia Heart rate as low as 20s at PCPs office and patient was reportedly hypotensive at that time.  However, in the ED she remained hypertensive with SBP 180-200.  At present, heart rate in the low 30s to low 40s and SBP in the 190s.   She is not on any AV nodal blocking agents.  Check TSH.  Per review of chart, patient was previously evaluated by EP and pacemaker implantation was deferred.  I have discussed with on-call cardiologist Dr. Hyacinth Meeker,  Chest discomfort Likely related to severe bradycardia.  Troponin 15> 18, not consistent with ACS.  Chest x-ray showing no acute cardiopulmonary process.  PE less likely given no tachycardia or hypoxia.  Elevated blood pressure No documented history of hypertension.  Avoid hypotension given bradycardia.  Blood pressure has remained persistently elevated, SBP currently in the 190s.  IV hydralazine 5 mg every 6 hours PRN SBP >180.   Hypothyroidism Continue Synthroid and check TSH.  DVT prophylaxis: SCDs Code Status: Discussed CODE STATUS with the patient and she wishes to be DNR/DNI. Family Communication: No family available at this time. Consults called: Cardiology Level of care: Progressive Care Unit Admission status: It is my clinical opinion that referral for OBSERVATION is reasonable and necessary in this patient based on the above information provided. The aforementioned taken together are felt to place the patient at high risk for further clinical deterioration. However, it is anticipated that the patient may be medically stable for discharge from the hospital within 24 to 48 hours.  John Giovanni MD [facility]  If 7PM-7AM, please contact night-coverage [email or web address]  10/21/2023, 10:42 PM

## 2023-10-21 NOTE — ED Triage Notes (Signed)
Pt sent from PCP office for abnormal EKG.  Pt does admit to occasional lightheadedness and fatigue.  Pt went to PCP for lower abdominal itching.  Denies pain or dysuria.  No cough, fever, chills, or sob.  Pt also c/o bump on her head 12 years ago.

## 2023-10-21 NOTE — ED Provider Notes (Signed)
[facility] Provider Note   CSN: [id number] Arrival date & time: 10/21/23  1156     History  Chief Complaint  Patient presents with   Abnormal ECG    Cynthia Young is a 89 y.o. female.  HPI     89 year old female with a history of symptomatic bradycardia, chest discomfort, hyperlipidemia, hypothyroidism, who was admitted to the hospital in March 2022 with bradycardia and was seen and evaluated by EP--noted to have prior Mobitz 2 AV block episodes, although primarily Mobitz type I and pacemaker placement was deferred, who presents from primary care physician's office with concern for bradycardia and hypotension.   She reports she was going to her primary care doctor's office because she was having a sensation of itching in her suprapubic area.  She denies having abdominal pain, nausea, vomiting, fevers, dysuria.  She just reports she has a sensation of itching in her lower abdomen.  She reports 1 month ago she did have an episode at home where she felt very weak, and a neighbor who is a nurse checked her blood pressure and found it to be low.  She reports that she drinks some Gatorade and began to feel better and had not sought care.  Other than this episode of lightheadedness, she denies having lightheadedness, denies syncope.  Reports that she had left-sided chest discomfort this morning that felt like a dull pressure to the left side of the chest.  She reports that she has this type of chest discomfort fairly often and has discussed it with cardiology in the past, and did not feel that the episode today was significantly different than what she has had before.  She reports that when she told her primary care doctor's office about her having some chest pain, and they noted her low heart rate that they wanted her to come to the emergency department.  At her PCPs office, her heart rate was found to be between 28-40, and her blood pressure was  found to be 84/44.   Past Medical History:  Diagnosis Date   Arthritis    Hyperglycemia 09/04/2020   Hyperlipidemia    Hypothyroid     Home Medications Prior to Admission medications   Medication Sig Start Date End Date Taking? Authorizing Provider  famotidine (PEPCID) 20 MG tablet Take 1 tablet (20 mg total) by mouth daily. Patient taking differently: Take 20 mg by mouth as needed for heartburn or indigestion. 07/27/22  Yes Meredith Pel, NP  levothyroxine (SYNTHROID) 50 MCG tablet TAKE ONE TABLET BY MOUTH ONE TIME DAILY Patient taking differently: Take 50 mcg by mouth daily. 09/28/23  Yes Bradd Canary, MD  Magnesium 250 MG TABS Take 1 tablet (250 mg total) by mouth 2 (two) times daily. 06/09/23  Yes Bradd Canary, MD      Allergies    Patient has no known allergies.    Review of Systems   Review of Systems  Physical Exam Updated Vital Signs BP (!) 207/61   Pulse (!) 25   Temp 98 F (36.7 C) (Oral)   Resp 11   Ht 4\' 11"  (1.499 m)   Wt 49.9 kg   SpO2 100%   BMI 22.22 kg/m  Physical Exam Vitals and nursing note reviewed.  Constitutional:      General: She is not in acute distress.    Appearance: She is well-developed. She is not diaphoretic.  HENT:     Head: Normocephalic and atraumatic.  Eyes:     Conjunctiva/sclera: Conjunctivae normal.  Cardiovascular:     Rate and Rhythm: Bradycardia present. Rhythm irregular.     Heart sounds: Normal heart sounds. No murmur heard.    No friction rub. No gallop.  Pulmonary:     Effort: Pulmonary effort is normal. No respiratory distress.     Breath sounds: Normal breath sounds. No wheezing or rales.  Abdominal:     General: There is no distension.     Palpations: Abdomen is soft.     Tenderness: There is no abdominal tenderness. There is no guarding.  Musculoskeletal:        General: No tenderness.     Cervical back: Normal range of motion.  Skin:    General: Skin is warm and dry.     Findings: No erythema or  rash.  Neurological:     Mental Status: She is alert and oriented to person, place, and time.     ED Results / Procedures / Treatments   Labs (all labs ordered are listed, but only abnormal results are displayed) Labs Reviewed  CBC WITH DIFFERENTIAL/PLATELET  COMPREHENSIVE METABOLIC PANEL  LACTIC ACID, PLASMA  LIPASE, BLOOD  URINALYSIS, W/ REFLEX TO CULTURE (INFECTION SUSPECTED)  LACTIC ACID, PLASMA  TROPONIN I (HIGH SENSITIVITY)  TROPONIN I (HIGH SENSITIVITY)    EKG EKG Interpretation Date/Time:  Friday October 21 2023 12:19:10 EST Ventricular Rate:  27 PR Interval:  183 QRS Duration:  109 QT Interval:  505 QTC Calculation: 363 R Axis:   -73  Text Interpretation: Mobitz I 2-degree AV block (Wenckebach block) and Mobitz II Atrial premature complex Left anterior fascicular block Probable left ventricular hypertrophy Anterior Q waves, possibly due to LVH Confirmed by Alvira Monday ([id number]) on 10/21/2023 3:18:25 PM  Radiology DG Chest Portable 1 View  Result Date: 10/21/2023 CLINICAL DATA:  cp EXAM: PORTABLE CHEST - 1 VIEW COMPARISON:  04/09/2020. FINDINGS: Cardiac silhouette is unremarkable. No pneumothorax or pleural effusion. The lungs are clear. Aorta is calcified. The visualized skeletal structures are unremarkable. IMPRESSION: No acute cardiopulmonary process. Electronically Signed   By: Layla Maw M.D.   On: 10/21/2023 14:51    Procedures Procedures    Medications Ordered in ED Medications  hydrALAZINE (APRESOLINE) injection 5 mg (5 mg Intravenous Given 10/21/23 1337)    ED Course/ Medical Decision Making/ A&P                                    89 year old female with a history of symptomatic bradycardia, chest discomfort, hyperlipidemia, hypothyroidism, who was admitted to the hospital in March 2022 with bradycardia and was seen and evaluated by EP--noted to have prior Mobitz 2 AV block episodes, although primarily Mobitz type I and pacemaker placement  was deferred, who presents from primary care physician's office with concern for bradycardia and hypotension.  1.  She did present initially with concern for lower abdominal itching to her primary care physician's office.  She does not have any abdominal pain or tenderness, have low suspicion for acute diverticulitis, small bowel obstruction, appendicitis, AAA, aortic dissection.  Urine study from her PCPs office this morning is not consistent with urinary tract infection.  Have low suspicion for emergent etiology of this itching sensation.  2.  Bradycardia/CP: She has been evaluated for this before, and while she is not symptomatic at this time, has had episodes of near syncope 1 month ago, had significant  weakness earlier at the PCPs office and was also noted to have hypotension at her PCPs office to the 80s.  Here in the emergency department, she is more hypertensive with pressures up to the 200s.  Her heart rate is ranging between 28-40.  While it appears she is primarily in Mobitz type I Wenkebach, it does appear she has times of Mobitz type II on the monitor, and in the setting of this and her bradycardia, will admit for continued observation.  She is not on any medications that would cause bradycardia.  Labs are completed and personally about interpreted by me show no evidence of hepatitis, pancreatitis, electrolyte abnormalities, leukocytosis, anemia.  Her lactic acid was within normal limits.  Her troponin is also within normal limits.  Chest x-ray was completed and personally read interpreted by me and radiology and showed no evidence of pneumonia, pneumothorax, pulmonary edema.  Discussed her presentation with Dr. Cristal Deer cardiology.  Will admit given concern for hypertension PCPs office, chest discomfort in setting of bradycardia, as well as low rates down to 28 and intermittent type II Mobitz type II HB.  Plan for hospitalist admission with EP consult.          Final Clinical  Impression(s) / ED Diagnoses Final diagnoses:  Bradycardia    Rx / DC Orders ED Discharge Orders     None         Alvira Monday, MD 10/21/23 1530

## 2023-10-21 NOTE — Progress Notes (Signed)
Acute Office Visit  Subjective:     Patient ID: Cynthia Young, female    DOB: [birth year], 89 y.o.   MRN: [id number]  Chief Complaint  Patient presents with   Abdominal Pain   Pruritis     Patient is in today for suprapubic discomfort/itching.   Discussed the use of AI scribe software for clinical note transcription with the patient, who gave verbal consent to proceed.  History of Present Illness   The patient, known to have a history of gallbladder removal, presents with complaints of intermittent lower abdominal and vaginal itching. The itching, described as internal, is not associated with any visible rash. The patient also reports a sensation of rawness in the vaginal area, but denies any associated rash, bleeding, or pain. The patient has been using Vaseline as a barrier, which provides some relief.  In addition to the itching, the patient has been experiencing changes in bowel habits, alternating between constipation and diarrhea. The patient also reports a dark vaginal discharge with a fishy odor, which is exacerbated by the consumption of fish.  The patient also reports occasional chest discomfort, which she attributes to gas. The discomfort is located under the left breast and has been present intermittently. The patient denies any associated shortness of breath, radiation of pain, or numbness. It started back up this morning and has not let up like she normally would with gas pain.   The patient also notes increased urinary frequency, particularly after drinking water. The patient denies any burning sensation during urination but describes it as uncomfortable. The patient has a history of a urinary tract infection but cannot recall when it last occurred.  The patient denies any recent changes in clothing or use of new products that could potentially cause an allergic reaction. The patient has not been sexually active for over a decade.            All review of systems  negative except what is listed in the HPI      Objective:    BP (!) 84/50   Pulse (!) 31   Ht 4\' 11"  (1.499 m)   Wt 110 lb (49.9 kg)   SpO2 98%   BMI 22.22 kg/m    Physical Exam Vitals reviewed.  Constitutional:      Appearance: Normal appearance.  Cardiovascular:     Rate and Rhythm: Regular rhythm. Bradycardia present.  Pulmonary:     Effort: Pulmonary effort is normal.     Breath sounds: Normal breath sounds.  Abdominal:     General: Abdomen is flat.     Tenderness: There is no abdominal tenderness. There is no right CVA tenderness or left CVA tenderness.  Genitourinary:    Comments: Deferred, self swab Skin:    General: Skin is warm and dry.     Findings: No rash.  Neurological:     Mental Status: She is alert and oriented to person, place, and time.  Psychiatric:        Mood and Affect: Mood normal.        Behavior: Behavior normal.        Thought Content: Thought content normal.        Judgment: Judgment normal.          Results for orders placed or performed in visit on 10/21/23  POCT Urinalysis Dipstick (Automated)  Result Value Ref Range   Color, UA yellow    Clarity, UA clear    Glucose, UA Negative Negative  Bilirubin, UA negative    Ketones, UA negative    Spec Grav, UA <=1.005 (A) 1.010 - 1.025   Blood, UA negative    pH, UA 5.0 5.0 - 8.0   Protein, UA Negative Negative   Urobilinogen, UA 0.2 0.2 or 1.0 E.U./dL   Nitrite, UA negative    Leukocytes, UA Negative Negative        Assessment & Plan:   Problem List Items Addressed This Visit       Active Problems   Varicose veins of both lower extremities - Primary   Relevant Orders   Ambulatory referral to Vascular Surgery   Other Visit Diagnoses     Suprapubic discomfort       Relevant Orders   Cervicovaginal ancillary only   POCT Urinalysis Dipstick (Automated) (Completed)   Urine Culture   Vaginal discharge       Relevant Orders   Cervicovaginal ancillary only   POCT  Urinalysis Dipstick (Automated) (Completed)   Urine Culture   Bradycardia       Relevant Orders   EKG 12-Lead (Completed)   Chest discomfort          EKG: HR 31, possible A. Fib per report, but  some P waves noted; left axis-anterior fascicular block, nonspecific ST depression, anteroseptal infarct, age undetermined     While waiting for urinalysis, patient started to feel more weak/fatigued, low energy. States she is still having the left sided chest discomfort - currently about 5/10 and hasn't let up like she normally expects with gas pains. Dinamap will not read BP/HR, but manual HR 28-40, EKG 31,and manual BP 84/44. Patient agreeable to go to ED. Escorted by CMA in wheelchair downstairs. Report called to EDP.    No orders of the defined types were placed in this encounter.   Return if symptoms worsen or fail to improve.  Clayborne Dana, NP

## 2023-10-21 NOTE — ED Notes (Signed)
ED TO INPATIENT HANDOFF REPORT  ED Nurse Name and Phone #: Cleatrice Burke, RN  S Name/Age/Gender Cynthia Young 89 y.o. female Room/Bed: MH04/MH04  Code Status   Code Status: Prior  Home/SNF/Other Home Patient oriented to: self, place, time, and situation Is this baseline? Yes   Triage Complete: Triage complete  Chief Complaint Bradycardia [R00.1]  Triage Note Pt sent from PCP office for abnormal EKG.  Pt does admit to occasional lightheadedness and fatigue.  Pt went to PCP for lower abdominal itching.  Denies pain or dysuria.  No cough, fever, chills, or sob.  Pt also c/o bump on her head 12 years ago.     Allergies No Known Allergies  Level of Care/Admitting Diagnosis ED Disposition     ED Disposition  Admit   Condition  --   Comment  Hospital Area: [facility] [[id number]]  Level of Care: Telemetry Cardiac [103]  Interfacility transfer: Yes  May place patient in observation at [facility] or [facility] if equivalent level of care is available:: No  Covid Evaluation: Asymptomatic - no recent exposure (last 10 days) testing not required  Diagnosis: Bradycardia [604540]  Admitting Physician: Magnus Ivan [[id number]]  Attending Physician: Alvira Monday [[id number]]          B Medical/Surgery History Past Medical History:  Diagnosis Date   Arthritis    Hyperglycemia 09/04/2020   Hyperlipidemia    Hypothyroid    Past Surgical History:  Procedure Laterality Date   BACK SURGERY     BIOPSY  04/12/2020   Procedure: BIOPSY;  Surgeon: Lemar Lofty., MD;  Location: [facility];  Service: Gastroenterology;;   Arbutus Leas Left    CHOLECYSTECTOMY N/A 04/11/2020   Procedure: LAPAROSCOPIC CHOLECYSTECTOMY WITH INTRAOPERATIVE CHOLANGIOGRAM;  Surgeon: Ovidio Kin, MD;  Location: [facility];  Service: General;  Laterality: N/A;   ERCP N/A 04/12/2020   Procedure: ENDOSCOPIC RETROGRADE CHOLANGIOPANCREATOGRAPHY (ERCP);  Surgeon: Lemar Lofty., MD;  Location: [facility];  Service: Gastroenterology;  Laterality: N/A;   FOOT SURGERY Right    bunion   REMOVAL OF STONES  04/12/2020   Procedure: REMOVAL OF STONES;  Surgeon: Lemar Lofty., MD;  Location: [facility];  Service: Gastroenterology;;   Dennison Mascot  04/12/2020   Procedure: Dennison Mascot;  Surgeon: Lemar Lofty., MD;  Location: [facility];  Service: Gastroenterology;;     A IV Location/Drains/Wounds Patient Lines/Drains/Airways Status     Active Line/Drains/Airways     Name Placement date Placement time Site Days   Peripheral IV 10/21/23 20 G 1" Left Antecubital 10/21/23  1220  Antecubital  less than 1            Intake/Output Last 24 hours No intake or output data in the 24 hours ending 10/21/23 1856  Labs/Imaging Results for orders placed or performed during the hospital encounter of 10/21/23 (from the past 48 hour(s))  CBC with Differential     Status: None   Collection Time: 10/21/23 12:14 PM  Result Value Ref Range   WBC 9.1 4.0 - 10.5 K/uL   RBC 4.62 3.87 - 5.11 MIL/uL   Hemoglobin 13.9 12.0 - 15.0 g/dL   HCT 13.0 86.5 - 78.4 %   MCV 93.9 80.0 - 100.0 fL   MCH 30.1 26.0 - 34.0 pg   MCHC 32.0 30.0 - 36.0 g/dL   RDW 69.6 29.5 - 28.4 %   Platelets 256 150 - 400 K/uL   nRBC 0.0 0.0 - 0.2 %   Neutrophils Relative %  60 %   Neutro Abs 5.4 1.7 - 7.7 K/uL   Lymphocytes Relative 30 %   Lymphs Abs 2.7 0.7 - 4.0 K/uL   Monocytes Relative 8 %   Monocytes Absolute 0.8 0.1 - 1.0 K/uL   Eosinophils Relative 1 %   Eosinophils Absolute 0.1 0.0 - 0.5 K/uL   Basophils Relative 1 %   Basophils Absolute 0.1 0.0 - 0.1 K/uL   Immature Granulocytes 0 %   Abs Immature Granulocytes 0.03 0.00 - 0.07 K/uL    Comment: Performed at [facility], [address] [place], [place] [address]  Comprehensive metabolic panel     Status: None   Collection Time: 10/21/23 12:14 PM  Result Value Ref Range   Sodium 138 135 - 145 mmol/L    Potassium 4.7 3.5 - 5.1 mmol/L   Chloride 105 98 - 111 mmol/L   CO2 24 22 - 32 mmol/L   Glucose, Bld 97 70 - 99 mg/dL    Comment: Glucose reference range applies only to samples taken after fasting for at least 8 hours.   BUN 19 8 - 23 mg/dL   Creatinine, Ser 5.95 0.44 - 1.00 mg/dL   Calcium 9.4 8.9 - 63.8 mg/dL   Total Protein 7.1 6.5 - 8.1 g/dL   Albumin 4.0 3.5 - 5.0 g/dL   AST 21 15 - 41 U/L   ALT 12 0 - 44 U/L   Alkaline Phosphatase 60 38 - 126 U/L   Total Bilirubin 0.8 <1.2 mg/dL   GFR, Estimated >75 >64 mL/min    Comment: (NOTE) Calculated using the CKD-EPI Creatinine Equation (2021)    Anion gap 9 5 - 15    Comment: Performed at [facility], [address] [place], [place] [address]  Lipase, blood     Status: None   Collection Time: 10/21/23 12:14 PM  Result Value Ref Range   Lipase 28 11 - 51 U/L    Comment: Performed at [facility], [address] [place], [place] [address]  Lactic acid, plasma     Status: None   Collection Time: 10/21/23 12:15 PM  Result Value Ref Range   Lactic Acid, Venous 1.3 0.5 - 1.9 mmol/L    Comment: Performed at [facility], [address] [place], [place] [address]  Troponin I (High Sensitivity)     Status: None   Collection Time: 10/21/23 12:15 PM  Result Value Ref Range   Troponin I (High Sensitivity) 15 <18 ng/L    Comment: (NOTE) Elevated high sensitivity troponin I (hsTnI) values and significant  changes across serial measurements may suggest ACS but many other  chronic and acute conditions are known to elevate hsTnI results.  Refer to the "Links" section for chest pain algorithms and additional  guidance. Performed at [facility], [address] [place], [place] [address]   Urinalysis, w/ Reflex to Culture (Infection Suspected) -Urine, Clean Catch     Status: Abnormal   Collection Time: 10/21/23  3:40 PM  Result Value Ref Range   Specimen Source URINE, CLEAN CATCH    Color,  Urine STRAW (A) YELLOW   APPearance CLEAR CLEAR   Specific Gravity, Urine 1.010 1.005 - 1.030   pH 6.0 5.0 - 8.0   Glucose, UA NEGATIVE NEGATIVE mg/dL   Hgb urine dipstick NEGATIVE NEGATIVE   Bilirubin Urine NEGATIVE NEGATIVE   Ketones, ur NEGATIVE NEGATIVE mg/dL   Protein, ur  NEGATIVE NEGATIVE mg/dL   Nitrite NEGATIVE NEGATIVE   Leukocytes,Ua NEGATIVE NEGATIVE   Squamous Epithelial / HPF NONE SEEN 0 - 5 /HPF   WBC, UA 0-5 0 - 5 WBC/hpf    Comment: Reflex urine culture not performed if WBC <=10, OR if Squamous epithelial cells >5. If Squamous epithelial cells >5, suggest recollection.   RBC / HPF NONE SEEN 0 - 5 RBC/hpf   Bacteria, UA NONE SEEN NONE SEEN   WBC Clumps PRESENT     Comment: Performed at [facility], [address] [place], [place] [address]  Troponin I (High Sensitivity)     Status: Abnormal   Collection Time: 10/21/23  4:01 PM  Result Value Ref Range   Troponin I (High Sensitivity) 18 (H) <18 ng/L    Comment: (NOTE) Elevated high sensitivity troponin I (hsTnI) values and significant  changes across serial measurements may suggest ACS but many other  chronic and acute conditions are known to elevate hsTnI results.  Refer to the "Links" section for chest pain algorithms and additional  guidance. Performed at [facility], [address], [place], [place] [address]    DG Chest Portable 1 View  Result Date: 10/21/2023 CLINICAL DATA:  cp EXAM: PORTABLE CHEST - 1 VIEW COMPARISON:  04/09/2020. FINDINGS: Cardiac silhouette is unremarkable. No pneumothorax or pleural effusion. The lungs are clear. Aorta is calcified. The visualized skeletal structures are unremarkable. IMPRESSION: No acute cardiopulmonary process. Electronically Signed   By: Layla Maw M.D.   On: 10/21/2023 14:51    Pending Labs Unresulted Labs (From admission, onward)     Start     Ordered   10/21/23 1215  Lactic acid, plasma  (Lactic Acid)  Now then every 2 hours,   STAT         10/21/23 1319            Vitals/Pain Today's Vitals   10/21/23 1745 10/21/23 1800 10/21/23 1815 10/21/23 1830  BP: (!) 157/60 (!) 163/54 (!) 175/61 (!) 181/62  Pulse: (!) 36 (!) 32 (!) 44 (!) 52  Resp: 19 16 17 20   Temp:      TempSrc:      SpO2: 96% 97% 97% 97%  Weight:      Height:      PainSc:        Isolation Precautions No active isolations  Medications Medications  hydrALAZINE (APRESOLINE) injection 5 mg (5 mg Intravenous Given 10/21/23 1337)    Mobility walks with person assist     Focused Assessments Cardiac Assessment Handoff:  Cardiac Rhythm: Sinus bradycardia, Heart block No results found for: "CKTOTAL", "CKMB", "CKMBINDEX", "TROPONINI" No results found for: "DDIMER" Does the Patient currently have chest pain? No    R Recommendations: See Admitting Provider Note  Report given to:   Additional Notes: Pt is alert and oriented.  Denies any pain or sob at this time.

## 2023-10-21 NOTE — H&P (Signed)
History and Physical    Cynthia Young:[id number] DOB: 05-27-1930 DOA: 10/21/2023  PCP: Bradd Canary, MD  Patient coming from: [facility] ED  Chief Complaint: Bradycardia  HPI: Cynthia Young is a 89 y.o. female with medical history significant of Mobitz type I Wenckebach AV block/symptomatic bradycardia previously evaluated by EP and pacemaker implantation was deferred, hyperlipidemia, hypothyroidism presented to ED from PCPs office due to concern for bradycardia, chest pain, and hypotension. Heart rate 28-40 at PCPs office and blood pressure 84/44.  In the ED, patient noted to be hypertensive with SBP 180-200.  EKG done in the ED concerning for Mobitz type II AV block with heart rate 27. Troponin 15> 18.  Chest x-ray showing no acute cardiopulmonary process.  EDP consulted cardiology (Dr. Cristal Deer) and patient sent to [facility] for EP evaluation.  Patient was given IV hydralazine 5 mg in the ED.  ED Course: ***  Review of Systems:  ROS  Past Medical History:  Diagnosis Date   Arthritis    Hyperglycemia 09/04/2020   Hyperlipidemia    Hypothyroid     Past Surgical History:  Procedure Laterality Date   BACK SURGERY     BIOPSY  04/12/2020   Procedure: BIOPSY;  Surgeon: Lemar Lofty., MD;  Location: [facility];  Service: Gastroenterology;;   Arbutus Leas Left    CHOLECYSTECTOMY N/A 04/11/2020   Procedure: LAPAROSCOPIC CHOLECYSTECTOMY WITH INTRAOPERATIVE CHOLANGIOGRAM;  Surgeon: Ovidio Kin, MD;  Location: [facility];  Service: General;  Laterality: N/A;   ERCP N/A 04/12/2020   Procedure: ENDOSCOPIC RETROGRADE CHOLANGIOPANCREATOGRAPHY (ERCP);  Surgeon: Lemar Lofty., MD;  Location: [facility];  Service: Gastroenterology;  Laterality: N/A;   FOOT SURGERY Right    bunion   REMOVAL OF STONES  04/12/2020   Procedure: REMOVAL OF STONES;  Surgeon: Meridee Score Netty Starring., MD;  Location: [facility];  Service: Gastroenterology;;   Dennison Mascot  04/12/2020    Procedure: Dennison Mascot;  Surgeon: Mansouraty, Netty Starring., MD;  Location: [facility];  Service: Gastroenterology;;     reports that she has never smoked. She has never used smokeless tobacco. She reports current alcohol use. She reports that she does not use drugs.  No Known Allergies  Family History  Problem Relation Age of Onset   Cancer Mother    Heart disease Father    Arthritis Father        rheumatoid   Cancer Brother        colon   Colon cancer Brother    Esophageal cancer Neg Hx    Inflammatory bowel disease Neg Hx    Liver disease Neg Hx    Pancreatic cancer Neg Hx    Stomach cancer Neg Hx     Prior to Admission medications   Medication Sig Start Date End Date Taking? Authorizing Provider  famotidine (PEPCID) 20 MG tablet Take 1 tablet (20 mg total) by mouth daily. Patient taking differently: Take 20 mg by mouth as needed for heartburn or indigestion. 07/27/22  Yes Meredith Pel, NP  levothyroxine (SYNTHROID) 50 MCG tablet TAKE ONE TABLET BY MOUTH ONE TIME DAILY Patient taking differently: Take 50 mcg by mouth daily. 09/28/23  Yes Bradd Canary, MD  Magnesium 250 MG TABS Take 1 tablet (250 mg total) by mouth 2 (two) times daily. 06/09/23  Yes Bradd Canary, MD    Physical Exam: Vitals:   10/21/23 1800 10/21/23 1815 10/21/23 1830 10/21/23 2027  BP: (!) 163/54 (!) 175/61 (!) 181/62 (!) 184/72  Pulse: (!) 32 Marland Kitchen)  44 (!) 52 (!) 37  Resp: 16 17 20 18   Temp:    98.4 F (36.9 C)  TempSrc:    Oral  SpO2: 97% 97% 97% 98%  Weight:    49.1 kg  Height:    4\' 11"  (1.499 m)    Physical Exam  Labs on Admission: I have personally reviewed following labs and imaging studies  CBC: Recent Labs  Lab 10/21/23 1214  WBC 9.1  NEUTROABS 5.4  HGB 13.9  HCT 43.4  MCV 93.9  PLT 256   Basic Metabolic Panel: Recent Labs  Lab 10/21/23 1214  NA 138  K 4.7  CL 105  CO2 24  GLUCOSE 97  BUN 19  CREATININE 0.84  CALCIUM 9.4   GFR: Estimated Creatinine  Clearance: 29.8 mL/min (by C-G formula based on SCr of 0.84 mg/dL). Liver Function Tests: Recent Labs  Lab 10/21/23 1214  AST 21  ALT 12  ALKPHOS 60  BILITOT 0.8  PROT 7.1  ALBUMIN 4.0   Recent Labs  Lab 10/21/23 1214  LIPASE 28   No results for input(s): "AMMONIA" in the last 168 hours. Coagulation Profile: No results for input(s): "INR", "PROTIME" in the last 168 hours. Cardiac Enzymes: No results for input(s): "CKTOTAL", "CKMB", "CKMBINDEX", "TROPONINI" in the last 168 hours. BNP (last 3 results) No results for input(s): "PROBNP" in the last 8760 hours. HbA1C: No results for input(s): "HGBA1C" in the last 72 hours. CBG: No results for input(s): "GLUCAP" in the last 168 hours. Lipid Profile: No results for input(s): "CHOL", "HDL", "LDLCALC", "TRIG", "CHOLHDL", "LDLDIRECT" in the last 72 hours. Thyroid Function Tests: No results for input(s): "TSH", "T4TOTAL", "FREET4", "T3FREE", "THYROIDAB" in the last 72 hours. Anemia Panel: No results for input(s): "VITAMINB12", "FOLATE", "FERRITIN", "TIBC", "IRON", "RETICCTPCT" in the last 72 hours. Urine analysis:    Component Value Date/Time   COLORURINE STRAW (A) 10/21/2023 1540   APPEARANCEUR CLEAR 10/21/2023 1540   LABSPEC 1.010 10/21/2023 1540   PHURINE 6.0 10/21/2023 1540   GLUCOSEU NEGATIVE 10/21/2023 1540   HGBUR NEGATIVE 10/21/2023 1540   BILIRUBINUR NEGATIVE 10/21/2023 1540   BILIRUBINUR negative 10/21/2023 1111   KETONESUR NEGATIVE 10/21/2023 1540   PROTEINUR NEGATIVE 10/21/2023 1540   PROTEINUR Negative 10/21/2023 1111   UROBILINOGEN 0.2 10/21/2023 1111   NITRITE NEGATIVE 10/21/2023 1540   NITRITE negative 10/21/2023 1111   LEUKOCYTESUR NEGATIVE 10/21/2023 1540   LEUKOCYTESUR Negative 10/21/2023 1111    Radiological Exams on Admission: DG Chest Portable 1 View  Result Date: 10/21/2023 CLINICAL DATA:  cp EXAM: PORTABLE CHEST - 1 VIEW COMPARISON:  04/09/2020. FINDINGS: Cardiac silhouette is unremarkable. No  pneumothorax or pleural effusion. The lungs are clear. Aorta is calcified. The visualized skeletal structures are unremarkable. IMPRESSION: No acute cardiopulmonary process. Electronically Signed   By: Layla Maw M.D.   On: 10/21/2023 14:51    Assessment and Plan  Mobitz type II AV block/symptomatic bradycardia Heart rate as low as 20s, currently in the 30s.  Patient reportedly hypotensive at PCPs office but instead has been hypertensive in the ED with SBP 180-200 and was given a dose of IV hydralazine in the ED.  At present, SBP in the 180s.  Per review of chart, patient was previously evaluated by EP and pacemaker implantation was deferred.  ED PA consulted cardiology (Dr. Cristal Deer) and patient sent to [facility] for EP evaluation.  Check TSH.  Chest pain Likely related to severe bradycardia.  Troponin 15> 18, not consistent with ACS.  Chest  x-ray showing no acute cardiopulmonary process.  PE less likely given no tachycardia or hypoxia.  Hyperlipidemia  Hypothyroidism Check TSH.  DVT prophylaxis: {Blank single:19197::"Lovenox","SQ Heparin","IV heparin gtt","Xarelto","Eliquis","Coumadin","SCDs","***"} Code Status: {Blank single:19197::"Full Code","DNR","DNR/DNI","Comfort Care","***"} Family Communication: ***  Consults called: ***  Level of care: {Blank single:19197::"Med-Surg","Telemetry bed","Progressive Care Unit","Step Down Unit"} Admission status: *** Time Spent: 75+ minutes***  John Giovanni MD [facility]  If 7PM-7AM, please contact night-coverage [email or web address]  10/21/2023, 10:42 PM

## 2023-10-22 DIAGNOSIS — E039 Hypothyroidism, unspecified: Secondary | ICD-10-CM

## 2023-10-22 DIAGNOSIS — R079 Chest pain, unspecified: Secondary | ICD-10-CM | POA: Diagnosis not present

## 2023-10-22 DIAGNOSIS — Z79899 Other long term (current) drug therapy: Secondary | ICD-10-CM | POA: Diagnosis not present

## 2023-10-22 DIAGNOSIS — Z8249 Family history of ischemic heart disease and other diseases of the circulatory system: Secondary | ICD-10-CM | POA: Diagnosis not present

## 2023-10-22 DIAGNOSIS — E875 Hyperkalemia: Secondary | ICD-10-CM | POA: Diagnosis present

## 2023-10-22 DIAGNOSIS — E785 Hyperlipidemia, unspecified: Secondary | ICD-10-CM | POA: Diagnosis present

## 2023-10-22 DIAGNOSIS — Z66 Do not resuscitate: Secondary | ICD-10-CM | POA: Diagnosis present

## 2023-10-22 DIAGNOSIS — Z9049 Acquired absence of other specified parts of digestive tract: Secondary | ICD-10-CM | POA: Diagnosis not present

## 2023-10-22 DIAGNOSIS — I441 Atrioventricular block, second degree: Secondary | ICD-10-CM

## 2023-10-22 DIAGNOSIS — R001 Bradycardia, unspecified: Secondary | ICD-10-CM | POA: Diagnosis present

## 2023-10-22 DIAGNOSIS — I10 Essential (primary) hypertension: Secondary | ICD-10-CM | POA: Diagnosis present

## 2023-10-22 DIAGNOSIS — R9431 Abnormal electrocardiogram [ECG] [EKG]: Secondary | ICD-10-CM | POA: Diagnosis not present

## 2023-10-22 DIAGNOSIS — I350 Nonrheumatic aortic (valve) stenosis: Secondary | ICD-10-CM | POA: Diagnosis present

## 2023-10-22 DIAGNOSIS — Z79890 Hormone replacement therapy (postmenopausal): Secondary | ICD-10-CM | POA: Diagnosis not present

## 2023-10-22 LAB — BASIC METABOLIC PANEL
Anion gap: 8 (ref 5–15)
BUN: 15 mg/dL (ref 8–23)
CO2: 22 mmol/L (ref 22–32)
Calcium: 9.3 mg/dL (ref 8.9–10.3)
Chloride: 108 mmol/L (ref 98–111)
Creatinine, Ser: 0.87 mg/dL (ref 0.44–1.00)
GFR, Estimated: >60 mL/min (ref >60)
Glucose, Bld: 93 mg/dL (ref 70–99)
Potassium: 5.2 mmol/L — ABNORMAL HIGH (ref 3.5–5.1)
Sodium: 138 mmol/L (ref 135–145)

## 2023-10-22 LAB — CBC
HCT: 44.3 % (ref 36.0–46.0)
Hemoglobin: 14.9 g/dL (ref 12.0–15.0)
MCH: 30.4 pg (ref 26.0–34.0)
MCHC: 33.6 g/dL (ref 30.0–36.0)
MCV: 90.4 fL (ref 80.0–100.0)
Platelets: DECREASED 10*3/uL (ref 150–400)
RBC: 4.9 MIL/uL (ref 3.87–5.11)
RDW: 13.6 % (ref 11.5–15.5)
WBC: 8.6 10*3/uL (ref 4.0–10.5)
nRBC: 0 % (ref 0.0–0.2)

## 2023-10-22 LAB — POTASSIUM: Potassium: 4.6 mmol/L (ref 3.5–5.1)

## 2023-10-22 LAB — TSH: TSH: 3.046 u[IU]/mL (ref 0.350–4.500)

## 2023-10-22 LAB — T4, FREE: Free T4: 1.05 ng/dL (ref 0.61–1.12)

## 2023-10-22 LAB — MAGNESIUM: Magnesium: 2.2 mg/dL (ref 1.7–2.4)

## 2023-10-22 MED ORDER — FAMOTIDINE 20 MG PO TABS
20.0000 mg | ORAL_TABLET | Freq: Every day | ORAL | Status: DC
  Administered 2023-10-22 – 2023-10-24 (×3): 20 mg via ORAL
  Filled 2023-10-22 (×3): qty 1

## 2023-10-22 MED ORDER — MAGNESIUM GLUCONATE 500 MG PO TABS
250.0000 mg | ORAL_TABLET | Freq: Two times a day (BID) | ORAL | Status: DC
  Administered 2023-10-22 – 2023-10-24 (×5): 250 mg via ORAL
  Filled 2023-10-22 (×6): qty 1

## 2023-10-22 NOTE — Progress Notes (Signed)
Patient arrived with heart block and heart rate 30-60s. Complaints of moderate headache. Nurse explained it could be due to hypertension; systolic in 180s. Paged admissions/physician of arrival. Provided education and multiple discussions around patient's heart condition. Patient very reflective and concerned about her situation. Given PRN hydralizine per order for BP. After brief discussion with patient, also given PRN tylenol. Roughly 15 minutes later patient called almost tearful, stating she is "very afraid". Also reporting "trembling all over"; no major shaking found upon assessment. Patient felt it was related to the tylenol and that she's had this reaction before taking too much tylenol. Will document as allergy in chart. Vitals unchanged. Explained not dismissing patient's feelings but likely some anxiety playing a part in symptoms. Patient says its not anxiety and does not want to take any anxiety meds. Stayed with patient while she called a couple family members. Very tearful crying to family she should have never come to the hospital, she feels she wont make it to the morning, and saying goodbye to all family and that she should have been a better mother/grandmother. After much active listening and communication with nurse and family, patient now trying to rest and sleep. Will continue to monitor.

## 2023-10-22 NOTE — Plan of Care (Signed)

## 2023-10-22 NOTE — Consult Note (Addendum)
Cardiology Consultation   Patient ID: Cynthia Young MRN: [id number]; DOB: 1930-11-04  Admit date: 10/21/2023 Date of Consult: 10/22/2023  PCP:  Bradd Canary, MD   [facility] Providers Cardiologist:  Reatha Harps, MD        Patient Profile:   Cynthia Young is a 89 y.o. female with a hx of hyperlipidemia, hypothyroidism who is being seen 10/22/2023 for the evaluation of bradycardia at the request of Ramiro Harvest, MD.  History of Present Illness:   [name] went to her primary care doctor's office yesterday with a chief complaint of itching on her abdomen.  While assessing her vital signs at that visit, she was found to be bradycardic and reportedly hypotensive so she was sent to the ER for evaluation.  In the ER, she was actually hypertensive but was indeed bradycardic with heart rates in the 30s.  She was transferred to [facility] where she could be evaluated by cardiology.  Today, she reports feeling relatively well.  She states that on a normal day she can do all of her activities without any difficulty.  She drives her own car.  She goes to the grocery store and shops for herself.  She cares for herself.  She states that intermittently she will have an episode of dizziness, but this can occur with activity or sitting at rest.  She does not endorse any symptoms that are specifically correlated to her low heart rates.  She denies any palpitations or syncopal events.   Past Medical History:  Diagnosis Date   Arthritis    Hyperglycemia 09/04/2020   Hyperlipidemia    Hypothyroid     Past Surgical History:  Procedure Laterality Date   BACK SURGERY     BIOPSY  04/12/2020   Procedure: BIOPSY;  Surgeon: Meridee Score Netty Starring., MD;  Location: [facility];  Service: Gastroenterology;;   Arbutus Leas Left    CHOLECYSTECTOMY N/A 04/11/2020   Procedure: LAPAROSCOPIC CHOLECYSTECTOMY WITH INTRAOPERATIVE CHOLANGIOGRAM;  Surgeon: Ovidio Kin, MD;  Location: [facility];   Service: General;  Laterality: N/A;   ERCP N/A 04/12/2020   Procedure: ENDOSCOPIC RETROGRADE CHOLANGIOPANCREATOGRAPHY (ERCP);  Surgeon: Lemar Lofty., MD;  Location: [facility];  Service: Gastroenterology;  Laterality: N/A;   FOOT SURGERY Right    bunion   REMOVAL OF STONES  04/12/2020   Procedure: REMOVAL OF STONES;  Surgeon: Meridee Score Netty Starring., MD;  Location: [facility];  Service: Gastroenterology;;   Dennison Mascot  04/12/2020   Procedure: Dennison Mascot;  Surgeon: Lemar Lofty., MD;  Location: [facility];  Service: Gastroenterology;;     Inpatient Medications: Scheduled Meds:  famotidine  20 mg Oral Daily   levothyroxine  50 mcg Oral Q0600   magnesium gluconate  250 mg Oral BID   Continuous Infusions:  PRN Meds: acetaminophen **OR** acetaminophen, hydrALAZINE  Allergies:    Allergies  Allergen Reactions   Tylenol [Acetaminophen]     Generalized tremor    Social History:   Social History   Socioeconomic History   Marital status: Widowed    Spouse name: Not on file   Number of children: Not on file   Years of education: Not on file   Highest education level: Not on file  Occupational History   Not on file  Tobacco Use   Smoking status: Never   Smokeless tobacco: Never  Vaping Use   Vaping status: Never Used  Substance and Sexual Activity   Alcohol use: Yes    Comment: socially   Drug  use: Never   Sexual activity: Not Currently  Other Topics Concern   Not on file  Social History Narrative   worked in [employer], short time. No cigarettes or drug use. Live by self no dietary restictions and walks daily   Social Determinants of Health   Financial Resource Strain: Low Risk  (09/23/2023)   Overall Financial Resource Strain (CARDIA)    Difficulty of Paying Living Expenses: Not hard at all  Food Insecurity: No Food Insecurity (10/21/2023)   Hunger Vital Sign    Worried About Running Out of Food in the Last Year: Never true    Ran  Out of Food in the Last Year: Never true  Transportation Needs: No Transportation Needs (10/21/2023)   PRAPARE - Administrator, Civil Service (Medical): No    Lack of Transportation (Non-Medical): No  Physical Activity: Inactive (09/23/2023)   Exercise Vital Sign    Days of Exercise per Week: 0 days    Minutes of Exercise per Session: 0 min  Stress: No Stress Concern Present (09/23/2023)   [employer] of Occupational Health - Occupational Stress Questionnaire    Feeling of Stress : Not at all  Social Connections: Socially Isolated (09/23/2023)   Social Connection and Isolation Panel [NHANES]    Frequency of Communication with Friends and Family: Once a week    Frequency of Social Gatherings with Friends and Family: Once a week    Attends Religious Services: Never    Database administrator or Organizations: No    Attends Banker Meetings: Never    Marital Status: Widowed  Intimate Partner Violence: Not At Risk (10/21/2023)   Humiliation, Afraid, Rape, and Kick questionnaire    Fear of Current or Ex-Partner: No    Emotionally Abused: No    Physically Abused: No    Sexually Abused: No    Family History:   Family History  Problem Relation Age of Onset   Cancer Mother    Heart disease Father    Arthritis Father        rheumatoid   Cancer Brother        colon   Colon cancer Brother    Esophageal cancer Neg Hx    Inflammatory bowel disease Neg Hx    Liver disease Neg Hx    Pancreatic cancer Neg Hx    Stomach cancer Neg Hx      ROS:  Please see the history of present illness.  All other ROS reviewed and negative.     Physical Exam/Data:   Vitals:   10/21/23 2357 10/22/23 0541 10/22/23 1001 10/22/23 1122  BP: (!) 193/62 (!) 158/58 (!) 146/59 (!) 147/57  Pulse: (!) 33 (!) 35 (!) 31 (!) 33  Resp:  16  18  Temp:  98.2 F (36.8 C)    TempSrc:  Oral    SpO2: 99% 97% 97% 96%  Weight:      Height:       No intake or output data in the 24 hours  ending 10/22/23 1457    10/21/2023    8:27 PM 10/21/2023   12:24 PM 10/21/2023    9:57 AM  Last 3 Weights  Weight (lbs) 108 lb 4.8 oz 110 lb 110 lb  Weight (kg) 49.125 kg 49.896 kg 49.896 kg     Body mass index is 21.87 kg/m.  General:  Well nourished, well developed, in no acute distress HEENT: normal Neck: no JVD Cardiac: Bradycardic and irregular Lungs:  Normal work of breathing Abd: soft, nontender, no hepatomegaly  Ext: no edema Skin: warm and dry  Neuro:  CNs 2-12 intact, no focal abnormalities noted Psych:  Normal affect   EKG:  The EKG was personally reviewed and demonstrates: Sinus rhythm with both Mobitz 1 and 2-1 AV block Telemetry:  Telemetry was personally reviewed and demonstrates: Periods of 2-1 AV block, as well as Mobitz 1 AV block and even one-to-one conduction at times.  Relevant CV Studies: Echo 05/2020:    1. Mobitz II AVB was noted during the study   2. Left ventricular ejection fraction, by estimation, is 65 to 70%. The  left ventricle has normal function. The left ventricle has no regional  wall motion abnormalities. Left ventricular diastolic parameters are  consistent with Grade I diastolic  dysfunction (impaired relaxation).   3. Right ventricular systolic function is normal. The right ventricular  size is normal. There is normal pulmonary artery systolic pressure. The  estimated right ventricular systolic pressure is 25.8 mmHg.   4. The mitral valve is grossly normal. Trivial mitral valve  regurgitation.   5. The aortic valve is tricuspid. Aortic valve regurgitation is not  visualized.   6. The inferior vena cava is normal in size with greater than 50%  respiratory variability, suggesting right atrial pressure of 3 mmHg.   Laboratory Data:  High Sensitivity Troponin:   Recent Labs  Lab 10/21/23 1215 10/21/23 1601  TROPONINIHS 15 18*     Chemistry Recent Labs  Lab 10/21/23 1214 10/22/23 0930 10/22/23 1115  NA 138 138  --   K 4.7 5.2*  4.6  CL 105 108  --   CO2 24 22  --   GLUCOSE 97 93  --   BUN 19 15  --   CREATININE 0.84 0.87  --   CALCIUM 9.4 9.3  --   MG  --  2.2  --   GFRNONAA >60 >60  --   ANIONGAP 9 8  --     Recent Labs  Lab 10/21/23 1214  PROT 7.1  ALBUMIN 4.0  AST 21  ALT 12  ALKPHOS 60  BILITOT 0.8   Lipids No results for input(s): "CHOL", "TRIG", "HDL", "LABVLDL", "LDLCALC", "CHOLHDL" in the last 168 hours.  Hematology Recent Labs  Lab 10/21/23 1214 10/22/23 0930  WBC 9.1 8.6  RBC 4.62 4.90  HGB 13.9 14.9  HCT 43.4 44.3  MCV 93.9 90.4  MCH 30.1 30.4  MCHC 32.0 33.6  RDW 13.7 13.6  PLT 256 PLATELET CLUMPS NOTED ON SMEAR, COUNT APPEARS DECREASED   Thyroid  Recent Labs  Lab 10/22/23 0307 10/22/23 1114  TSH 3.046  --   FREET4  --  1.05    BNPNo results for input(s): "BNP", "PROBNP" in the last 168 hours.  DDimer No results for input(s): "DDIMER" in the last 168 hours.   Radiology/Studies:  DG Chest Portable 1 View  Result Date: 10/21/2023 CLINICAL DATA:  cp EXAM: PORTABLE CHEST - 1 VIEW COMPARISON:  04/09/2020. FINDINGS: Cardiac silhouette is unremarkable. No pneumothorax or pleural effusion. The lungs are clear. Aorta is calcified. The visualized skeletal structures are unremarkable. IMPRESSION: No acute cardiopulmonary process. Electronically Signed   By: Layla Maw M.D.   On: 10/21/2023 14:51     Assessment and Plan:  Yasmen Gallick is a 89 y.o. female with a hx of hyperlipidemia, hypothyroidism who is being seen 10/22/2023 for the evaluation of bradycardia at the request of Ramiro Harvest, MD.  Patient has  a known history of Mobitz 1 AV block.  She was evaluated by EP in 2022 and felt that she did not have symptoms related to her bradycardia.  She still has periods of Mobitz 1 AV block, but also has 2-1 block for long stretches of time at rest.  Most of her EKGs here are either 2-1 AV block or Mobitz 1 AV block.  While on telemetry she also has stretches of both Mobitz 1  AV block and 2-1 AV block.  I suspect that her 2-1 block is at the level of the AV node given that she has a narrow QRS at baseline.  At times she appears to conduct one-to-one.  It is difficult to correlate any symptoms with her bradycardia.  She does not endorse any known symptoms associated with her low heart rates.  She actually reports that she feels better when she exerts herself which would go against high-grade AV block.  She has been hypertensive despite bradycardia.  Since being in the hospital we have not seen evidence of hemodynamically unstable bradycardia. In the absence of definitive symptoms, with only Mobitz 1 AV block and 2-1 AV block that is felt to be at the level of the AV node, there would be no absolute indication for permanent pacemaker.  Patient is hoping to avoid a pacemaker.  Problem List:  2:1 AV block Mobitz I AV block Hypothyroidism  Plan:  - 12 lead ECG. - Continue monitoring on telemetry. - Repeat echocardiogram.  - Check free T4 to make sure hypothyroidism is not contributing to her worsening bradycardia.  - Will ask nurse to ambulate patient in halls and document on telemetry for review.  Other option would be to perform exercise treadmill test and see if she can adequately increase her heart rate with activity.  For questions or updates, please contact [facility] Please consult [email or web address] for contact info under    Signed, Nobie Putnam, MD  10/22/2023 2:57 PM

## 2023-10-22 NOTE — Progress Notes (Signed)
Pt walked for 4 minutes straight for a total distance of around 200 feet. Pt's heart rate fluctuated between 55-80 with the longest sustain rate being 72. Pt expressed no episodes of dizziness or fatigue.

## 2023-10-22 NOTE — Care Management Obs Status (Signed)
MEDICARE OBSERVATION STATUS NOTIFICATION   Patient Details  Name: Moira Diguglielmo MRN: [id number] Date of Birth: 05-10-1930   Medicare Observation Status Notification Given:  Yes    Ronny Bacon, RN 10/22/2023, 12:51 PM

## 2023-10-22 NOTE — Progress Notes (Addendum)
PROGRESS NOTE    Cynthia Young  VOZ:[id number] DOB: 12/08/1929 DOA: 10/21/2023 PCP: Bradd Canary, MD    Chief Complaint  Patient presents with   Abnormal ECG    Brief Narrative: Patient is 89 year old female history of Mobitz type I Wenckebach AV block/symptomatic bradycardia previously evaluated by EP and PPM implantation was deferred, hyperlipidemia, hypothyroidism presented to ED from PCPs office due to concern for bradycardia, chest pain and hypotension.  Patient's heart rate noted to be 28-40 at PCPs office and blood pressure 84/44.  When patient seen in the ED patient was more hypotensive with systolic blood pressures in the 180s to 200s, EKG concerning for Mobitz type II AV block with heart rate of 27.  Troponins noted at 15 and 18.  Chest x-ray unremarkable.  EDP consulted cardiology and patient sent to [facility] for further EP evaluation.   Assessment & Plan:   Principal Problem:   Symptomatic bradycardia Active Problems:   Chest pain   Mobitz type 2 second degree atrioventricular block   Hypothyroidism  #1 Mobitz type II AV block/symptomatic bradycardia -Patient noted to have heart rates in the 20s at PCPs office reportedly was noted to be hypotensive with complaints of some chest pain/discomfort. -Patient when seen in the ED noted to have systolic blood pressures in the 180s to 200s with heart rates in the low 30s to 40s. -Patient not on AV nodal blocking agents. -TSH within normal limits. Free T4-within normal limits 1.05. -It is noted that patient was previously evaluated by EP pacemaker implantation deferred at that time. -Cardiology consulted and EP assessing patient for further evaluation and management.  2.  Chest discomfort -Likely secondary to problem #1. -High-sensitivity troponin 15>> 18 not consistent with ACS. -Chest x-ray with no acute cardiopulmonary process. -Continue pain management.  3.  Elevated blood pressure -Patient with no prior history of  hypertension. -Avoid hypotension given bradycardia. -IV hydralazine as needed  4.  Hypothyroidism -TSH within normal limits. -Synthroid.  5.  Hyperkalemia -Repeat potassium 4.6   DVT prophylaxis: SCDs Code Status: DNR Family Communication: Updated patient.  No family at bedside. Disposition: Likely home when clinically improved and cleared by cardiology.  Status is: Inpatient The patient will require care spanning > 2 midnights and should be moved to inpatient because: Severity of illness   Consultants:  Cardiology: Dr. Jimmey Ralph 10/22/2023  Procedures:  Chest x-ray 10/21/2023   Antimicrobials:  Anti-infectives (From admission, onward)    None         Subjective: Patient laying in bed.  Denies any lightheadedness or dizziness today.  States had left-sided substernal chest pain prior to admission and now chest pain more midsternal feels like a pressure which is improving.  Denies any significant shortness of breath.  Denies any abdominal pain.  Objective: Vitals:   10/21/23 2357 10/22/23 0541 10/22/23 1001 10/22/23 1122  BP: (!) 193/62 (!) 158/58 (!) 146/59 (!) 147/57  Pulse: (!) 33 (!) 35 (!) 31 (!) 33  Resp:  16  18  Temp:  98.2 F (36.8 C)    TempSrc:  Oral    SpO2: 99% 97% 97% 96%  Weight:      Height:       No intake or output data in the 24 hours ending 10/22/23 1143 Filed Weights   10/21/23 1224 10/21/23 2027  Weight: 49.9 kg 49.1 kg    Examination:  General exam: Appears calm and comfortable.  Respiratory system: Clear to auscultation.  No wheezes, no crackles, no  rhonchi.  Fair air movement.  Speaking in full sentences.  Respiratory effort normal. Cardiovascular system: Bradycardia. No JVD, murmurs, rubs, gallops or clicks. No pedal edema. Gastrointestinal system: Abdomen is nondistended, soft and nontender. No organomegaly or masses felt. Normal bowel sounds heard. Central nervous system: Alert and oriented. No focal neurological  deficits. Extremities: Symmetric 5 x 5 power. Skin: No rashes, lesions or ulcers Psychiatry: Judgement and insight appear normal. Mood & affect appropriate.     Data Reviewed: I have personally reviewed following labs and imaging studies  CBC: Recent Labs  Lab 10/21/23 1214 10/22/23 0930  WBC 9.1 8.6  NEUTROABS 5.4  --   HGB 13.9 14.9  HCT 43.4 44.3  MCV 93.9 90.4  PLT 256 PLATELET CLUMPS NOTED ON SMEAR, COUNT APPEARS DECREASED    Basic Metabolic Panel: Recent Labs  Lab 10/21/23 1214 10/22/23 0930  NA 138 138  K 4.7 5.2*  CL 105 108  CO2 24 22  GLUCOSE 97 93  BUN 19 15  CREATININE 0.84 0.87  CALCIUM 9.4 9.3  MG  --  2.2    GFR: Estimated Creatinine Clearance: 28.7 mL/min (by C-G formula based on SCr of 0.87 mg/dL).  Liver Function Tests: Recent Labs  Lab 10/21/23 1214  AST 21  ALT 12  ALKPHOS 60  BILITOT 0.8  PROT 7.1  ALBUMIN 4.0    CBG: No results for input(s): "GLUCAP" in the last 168 hours.   No results found for this or any previous visit (from the past 240 hour(s)).       Radiology Studies: DG Chest Portable 1 View  Result Date: 10/21/2023 CLINICAL DATA:  cp EXAM: PORTABLE CHEST - 1 VIEW COMPARISON:  04/09/2020. FINDINGS: Cardiac silhouette is unremarkable. No pneumothorax or pleural effusion. The lungs are clear. Aorta is calcified. The visualized skeletal structures are unremarkable. IMPRESSION: No acute cardiopulmonary process. Electronically Signed   By: Layla Maw M.D.   On: 10/21/2023 14:51        Scheduled Meds:  famotidine  20 mg Oral Daily   levothyroxine  50 mcg Oral Q0600   magnesium gluconate  250 mg Oral BID   Continuous Infusions:   LOS: 0 days    Time spent: 35 minutes    Ramiro Harvest, MD [facility]   To contact the attending provider between 7A-7P or the covering provider during after hours 7P-7A, please log into the web site [email or web address] and access using universal [place] password  for that web site. If you do not have the password, please call the hospital operator.  10/22/2023, 11:43 AM

## 2023-10-23 DIAGNOSIS — R079 Chest pain, unspecified: Secondary | ICD-10-CM | POA: Diagnosis not present

## 2023-10-23 DIAGNOSIS — R001 Bradycardia, unspecified: Secondary | ICD-10-CM | POA: Diagnosis not present

## 2023-10-23 DIAGNOSIS — I441 Atrioventricular block, second degree: Secondary | ICD-10-CM | POA: Diagnosis not present

## 2023-10-23 DIAGNOSIS — E039 Hypothyroidism, unspecified: Secondary | ICD-10-CM | POA: Diagnosis not present

## 2023-10-23 LAB — CBC
HCT: 41.3 % (ref 36.0–46.0)
Hemoglobin: 13.2 g/dL (ref 12.0–15.0)
MCH: 30 pg (ref 26.0–34.0)
MCHC: 32 g/dL (ref 30.0–36.0)
MCV: 93.9 fL (ref 80.0–100.0)
Platelets: 248 10*3/uL (ref 150–400)
RBC: 4.4 MIL/uL (ref 3.87–5.11)
RDW: 13.6 % (ref 11.5–15.5)
WBC: 8.4 10*3/uL (ref 4.0–10.5)
nRBC: 0 % (ref 0.0–0.2)

## 2023-10-23 LAB — URINE CULTURE
MICRO NUMBER:: 15818986
Result:: NO GROWTH
SPECIMEN QUALITY:: ADEQUATE

## 2023-10-23 LAB — BASIC METABOLIC PANEL
Anion gap: 8 (ref 5–15)
BUN: 20 mg/dL (ref 8–23)
CO2: 20 mmol/L — ABNORMAL LOW (ref 22–32)
Calcium: 9.2 mg/dL (ref 8.9–10.3)
Chloride: 111 mmol/L (ref 98–111)
Creatinine, Ser: 0.97 mg/dL (ref 0.44–1.00)
GFR, Estimated: 55 mL/min — ABNORMAL LOW (ref >60)
Glucose, Bld: 97 mg/dL (ref 70–99)
Potassium: 3.9 mmol/L (ref 3.5–5.1)
Sodium: 139 mmol/L (ref 135–145)

## 2023-10-23 LAB — MAGNESIUM: Magnesium: 2.2 mg/dL (ref 1.7–2.4)

## 2023-10-23 NOTE — Progress Notes (Addendum)
Patient Name: Cynthia Young Date of Encounter: 10/23/2023 [facility] Cardiologist: Reatha Harps, MD   Interval Summary  .    Ambulated halls yesterday and today without difficulty or symptoms. No acute overnight events. Feels well. Hoping to go home.   Vital Signs .    Vitals:   10/22/23 1950 10/22/23 2326 10/23/23 0425 10/23/23 0949  BP: (!) 138/52 131/64 132/64 (!) 149/91  Pulse: (!) 35 67 (!) 36 (!) 33  Resp: 16 16 16 16   Temp: 98.2 F (36.8 C) 98.2 F (36.8 C) 97.7 F (36.5 C) 97.7 F (36.5 C)  TempSrc: Oral Oral Oral Oral  SpO2: 96% 97% 97% 98%  Weight:      Height:        Intake/Output Summary (Last 24 hours) at 10/23/2023 1054 Last data filed at 10/23/2023 0951 Gross per 24 hour  Intake 160 ml  Output --  Net 160 ml      10/21/2023    8:27 PM 10/21/2023   12:24 PM 10/21/2023    9:57 AM  Last 3 Weights  Weight (lbs) 108 lb 4.8 oz 110 lb 110 lb  Weight (kg) 49.125 kg 49.896 kg 49.896 kg      Telemetry/ECG    Telemetry:  Telemetry was personally reviewed and demonstrates: Periods of 2-1 AV block, as well as Mobitz 1 AV block and even one-to-one conduction as high as 80bpm with ambulation.   Physical Exam .   GEN: No acute distress.   Neck: No JVD Cardiac: Bradycardic and regular. Respiratory: Clear to auscultation bilaterally. GI: Soft, nontender, non-distended  MS: No edema  Assessment & Plan .     Cynthia Young is a 89 y.o. female with a hx of hyperlipidemia, hypothyroidism who is being seen 10/22/2023 for the evaluation of bradycardia at the request of Ramiro Harvest, MD.   Patient has a known history of Mobitz 1 AV block and periods of 2:1 AV block.  She was evaluated by EP in 2022 and felt that she did not have symptoms related to her bradycardia.  She still has periods of Mobitz 1 AV block, but also has more frequent 2-1 block for long stretches of time at sitting in bed at rest and during sleep. She is able to increase her heart rate  to ~80bpm with 1:1 AV conduction during ambulation. She has no symptoms with exertion. Given this, I suspect that her 2-1 block is at the level of the AV node, also supported by the fact that she a narrow QRS at baseline.  It is difficult to correlate any symptoms with her bradycardia.  She does not endorse any known symptoms associated with her low heart rates. She did not have any symptoms with exerting herself here, while ambulating the halls. She actually reports that she feels better when she exerts herself which would go against high-grade AV block.  I have not seen any evidence of high grade AV block on telemetry or ECGs. She has been hypertensive despite bradycardia.  Since being in the hospital we have not seen evidence of hemodynamically unstable bradycardia. In the absence of definitive symptoms, with only Mobitz 1 AV block and 2-1 AV block that is felt to be at the level of the AV node, there would be no absolute indication for permanent pacemaker. Importantly, patient does not want a pacemaker.   Problem List:  2:1 AV block Mobitz I AV block Hypothyroidism   Plan:  - Repeat echocardiogram pending. If echocardiogram is  normal, then no further workup planned at this time.  - TSH and free T4 were normal. - No plans for permanent pacemaker at this time.  - Educated patient and daughter on warning signs for symptomatic bradycardia or progression of conduction disease with instructions to contact us / seek care.  - I will have her follow up with me in clinic in 4 weeks.   For questions or updates, please contact [facility] Please consult [email or web address] for contact info under      Signed, Nobie Putnam, MD

## 2023-10-23 NOTE — Progress Notes (Signed)
Mobility Specialist Progress Note:   10/23/23 1100  Mobility  Activity Ambulated with assistance in hallway  Level of Assistance Contact guard assist, steadying assist  Assistive Device Other (Comment) (HHA)  Distance Ambulated (ft) 450 ft  Activity Response Tolerated well  Mobility Referral Yes  Mobility visit 1 Mobility  Mobility Specialist Start Time (ACUTE ONLY) 1130  Mobility Specialist Stop Time (ACUTE ONLY) 1143  Mobility Specialist Time Calculation (min) (ACUTE ONLY) 13 min   Pt HR noted to be 28bpm on monitor before entering room. RN requested mobility to see. Pt eager for mobility session. Requiring only HHA for comfort throughout ambulation. HR got up to 100bpm during ambulation, pt stating she feels much better when moving versus sitting around. Once returned to sitting, HR returned to 30s almost immediately. Pt left with all needs met.  Addison Lank Mobility Specialist Please contact via SecureChat or  Rehab office at [phone]

## 2023-10-23 NOTE — Plan of Care (Signed)

## 2023-10-23 NOTE — Progress Notes (Signed)
PROGRESS NOTE    Cynthia Young  [id number]:[id number] DOB: 1930/01/07 DOA: 10/21/2023 PCP: Bradd Canary, MD    Chief Complaint  Patient presents with   Abnormal ECG    Brief Narrative: Patient is 89 year old female history of Mobitz type I Wenckebach AV block/symptomatic bradycardia previously evaluated by EP and PPM implantation was deferred, hyperlipidemia, hypothyroidism presented to ED from PCPs office due to concern for bradycardia, chest pain and hypotension.  Patient's heart rate noted to be 28-40 at PCPs office and blood pressure 84/44.  When patient seen in the ED patient was more hypotensive with systolic blood pressures in the 180s to 200s, EKG concerning for Mobitz type II AV block with heart rate of 27.  Troponins noted at 15 and 18.  Chest x-ray unremarkable.  EDP consulted cardiology and patient sent to [facility] for further EP evaluation.   Assessment & Plan:   Principal Problem:   Symptomatic bradycardia Active Problems:   Chest pain   Mobitz type 2 second degree atrioventricular block   Hypothyroidism  #1 Mobitz type II AV block/symptomatic bradycardia -Patient noted to have heart rates in the 20s at PCPs office reportedly was noted to be hypotensive with complaints of some chest pain/discomfort. -Patient when seen in the ED noted to have systolic blood pressures in the 180s to 200s with heart rates in the low 30s to 40s. -Patient not on AV nodal blocking agents. -TSH within normal limits. Free T4-within normal limits 1.05. -It is noted that patient was previously evaluated by EP pacemaker implantation deferred at that time. -Cardiology consulted and EP consulted who assessed patient and per his note difficult to correlate symptoms with her bradycardia.   -Per EP patient did not have any symptoms with exertion while ambulating in the hallways and actually reported she felt better after exertion which per EP will go against high-grade AV block.   -Per EP no evidence of  high-grade AV block on telemetry or EKGs and patient has been hypotensive despite bradycardia.   -Per EP no noted evidence of hemodynamically unstable bradycardia and does not feel at this time there is an absolute indication for PPM and patient does not want pacemaker at this time.   -EP recommending 2D echo which is currently pending and if normal no further workup needed at this time and patient can follow-up in the outpatient setting in 4 weeks with the EP.   -Appreciate cardiology/EP input and recommendations.    2.  Chest discomfort -Likely secondary to problem #1. -High-sensitivity troponin 15>> 18 not consistent with ACS. -Chest x-ray with no acute cardiopulmonary process. -Continue pain management.  3.  Elevated blood pressure -Patient with no prior history of hypertension. -Avoid hypotension given bradycardia. -IV hydralazine as needed  4.  Hypothyroidism -TSH within normal limits. -Free T4 within normal limits. -Continue home regimen Synthroid.  5.  Hyperkalemia -Resolved.  Potassium at 3.9 today.   DVT prophylaxis: SCDs Code Status: DNR Family Communication: Updated patient.  No family at bedside. Disposition: Likely home when clinically improved and cleared by cardiology.  Status is: Inpatient The patient will require care spanning > 2 midnights and should be moved to inpatient because: Severity of illness   Consultants:  Cardiology: Dr. Jimmey Ralph 10/22/2023  Procedures:  Chest x-ray 10/21/2023   Antimicrobials:  Anti-infectives (From admission, onward)    None         Subjective: Patient lying in bed.  Denies any chest pain or shortness of breath.  Denies any abdominal pain.  No lightheadedness or dizziness when ambulating in the hallway.  States she feels as close to her normal self.  Feels chest discomfort may be secondary to hiatal hernia.  Awaiting 2D echo to be done today.  Stated she saw EP earlier on today.    Objective: Vitals:   10/22/23 1950  10/22/23 2326 10/23/23 0425 10/23/23 0949  BP: (!) 138/52 131/64 132/64 (!) 149/91  Pulse: (!) 35 67 (!) 36 (!) 33  Resp: 16 16 16 16   Temp: 98.2 F (36.8 C) 98.2 F (36.8 C) 97.7 F (36.5 C) 97.7 F (36.5 C)  TempSrc: Oral Oral Oral Oral  SpO2: 96% 97% 97% 98%  Weight:      Height:        Intake/Output Summary (Last 24 hours) at 10/23/2023 1148 Last data filed at 10/23/2023 0951 Gross per 24 hour  Intake 160 ml  Output --  Net 160 ml   Filed Weights   10/21/23 1224 10/21/23 2027  Weight: 49.9 kg 49.1 kg    Examination:  General exam: NAD. Respiratory system: CTAB.  No wheezes, no crackles, no rhonchi.  Fair air movement.  Speaking in full sentences.  Normal respiratory effort.   Cardiovascular system: Bradycardia.  No JVD, no murmurs rubs or gallops.  No lower extremity edema.   Gastrointestinal system: Abdomen is soft, nontender, nondistended, positive bowel sounds.  No rebound.  No guarding.  Central nervous system: Alert and oriented. No focal neurological deficits. Extremities: Symmetric 5 x 5 power. Skin: No rashes, lesions or ulcers Psychiatry: Judgement and insight appear normal. Mood & affect appropriate.     Data Reviewed: I have personally reviewed following labs and imaging studies  CBC: Recent Labs  Lab 10/21/23 1214 10/22/23 0930 10/23/23 0417  WBC 9.1 8.6 8.4  NEUTROABS 5.4  --   --   HGB 13.9 14.9 13.2  HCT 43.4 44.3 41.3  MCV 93.9 90.4 93.9  PLT 256 PLATELET CLUMPS NOTED ON SMEAR, COUNT APPEARS DECREASED 248    Basic Metabolic Panel: Recent Labs  Lab 10/21/23 1214 10/22/23 0930 10/22/23 1115 10/23/23 0417  NA 138 138  --  139  K 4.7 5.2* 4.6 3.9  CL 105 108  --  111  CO2 24 22  --  20*  GLUCOSE 97 93  --  97  BUN 19 15  --  20  CREATININE 0.84 0.87  --  0.97  CALCIUM 9.4 9.3  --  9.2  MG  --  2.2  --  2.2    GFR: Estimated Creatinine Clearance: 25.8 mL/min (by C-G formula based on SCr of 0.97 mg/dL).  Liver Function  Tests: Recent Labs  Lab 10/21/23 1214  AST 21  ALT 12  ALKPHOS 60  BILITOT 0.8  PROT 7.1  ALBUMIN 4.0    CBG: No results for input(s): "GLUCAP" in the last 168 hours.   Recent Results (from the past 240 hour(s))  Urine Culture     Status: None   Collection Time: 10/21/23 11:50 AM   Specimen: Urine  Result Value Ref Range Status   MICRO NUMBER: [id number]  Final   SPECIMEN QUALITY: Adequate  Final   Sample Source URINE  Final   STATUS: FINAL  Final   Result: No Growth  Final         Radiology Studies: DG Chest Portable 1 View  Result Date: 10/21/2023 CLINICAL DATA:  cp EXAM: PORTABLE CHEST - 1 VIEW COMPARISON:  04/09/2020. FINDINGS: Cardiac silhouette is unremarkable. No  pneumothorax or pleural effusion. The lungs are clear. Aorta is calcified. The visualized skeletal structures are unremarkable. IMPRESSION: No acute cardiopulmonary process. Electronically Signed   By: Layla Maw M.D.   On: 10/21/2023 14:51        Scheduled Meds:  famotidine  20 mg Oral Daily   levothyroxine  50 mcg Oral Q0600   magnesium gluconate  250 mg Oral BID   Continuous Infusions:   LOS: 1 day    Time spent: 35 minutes    Ramiro Harvest, MD [facility]   To contact the attending provider between 7A-7P or the covering provider during after hours 7P-7A, please log into the web site [email or web address] and access using universal [place] password for that web site. If you do not have the password, please call the hospital operator.  10/23/2023, 11:48 AM

## 2023-10-24 ENCOUNTER — Inpatient Hospital Stay (HOSPITAL_COMMUNITY): Payer: Medicare Other

## 2023-10-24 DIAGNOSIS — E039 Hypothyroidism, unspecified: Secondary | ICD-10-CM | POA: Diagnosis not present

## 2023-10-24 DIAGNOSIS — R001 Bradycardia, unspecified: Secondary | ICD-10-CM | POA: Diagnosis not present

## 2023-10-24 DIAGNOSIS — I441 Atrioventricular block, second degree: Secondary | ICD-10-CM | POA: Diagnosis not present

## 2023-10-24 DIAGNOSIS — R9431 Abnormal electrocardiogram [ECG] [EKG]: Secondary | ICD-10-CM

## 2023-10-24 DIAGNOSIS — R079 Chest pain, unspecified: Secondary | ICD-10-CM | POA: Diagnosis not present

## 2023-10-24 LAB — ECHOCARDIOGRAM COMPLETE
AR max vel: 2.26 cm2
AV Area VTI: 2.1 cm2
AV Area mean vel: 2.21 cm2
AV Mean grad: 9 mm[Hg]
AV Peak grad: 17.6 mm[Hg]
Ao pk vel: 2.1 m/s
Area-P 1/2: 2.33 cm2
Height: 59 in
S' Lateral: 2.9 cm
Weight: 1732.8 [oz_av]

## 2023-10-24 LAB — MAGNESIUM: Magnesium: 2.1 mg/dL (ref 1.7–2.4)

## 2023-10-24 LAB — CERVICOVAGINAL ANCILLARY ONLY
Bacterial Vaginitis (gardnerella): NEGATIVE
Candida Glabrata: NEGATIVE
Candida Vaginitis: NEGATIVE
Comment: NEGATIVE
Comment: NEGATIVE
Comment: NEGATIVE

## 2023-10-24 LAB — BASIC METABOLIC PANEL
Anion gap: 8 (ref 5–15)
BUN: 20 mg/dL (ref 8–23)
CO2: 23 mmol/L (ref 22–32)
Calcium: 9.2 mg/dL (ref 8.9–10.3)
Chloride: 109 mmol/L (ref 98–111)
Creatinine, Ser: 1.01 mg/dL — ABNORMAL HIGH (ref 0.44–1.00)
GFR, Estimated: 53 mL/min — ABNORMAL LOW (ref >60)
Glucose, Bld: 96 mg/dL (ref 70–99)
Potassium: 4.1 mmol/L (ref 3.5–5.1)
Sodium: 140 mmol/L (ref 135–145)

## 2023-10-24 NOTE — Discharge Summary (Signed)
Physician Discharge Summary  Cynthia Young RUE:[id number] DOB: 1930-07-05 DOA: 10/21/2023  PCP: Bradd Canary, MD  Admit date: 10/21/2023 Discharge date: 10/24/2023  Time spent: 60 minutes  Recommendations for Outpatient Follow-up:  Follow-up with Dr. Elberta Fortis as scheduled on 11/24/2023 at 9 AM. Follow-up with Bradd Canary, MD in 2 weeks.  On follow-up patient will need a basic metabolic profile done to follow-up on electrolytes and renal function.  Patient's chest pain will need to be assessed for GI etiologies.   Discharge Diagnoses:  Principal Problem:   Symptomatic bradycardia Active Problems:   Chest pain   Mobitz type 2 second degree atrioventricular block   Hypothyroidism   Discharge Condition: Stable and improved.  Diet recommendation: Heart healthy  Filed Weights   10/21/23 1224 10/21/23 2027  Weight: 49.9 kg 49.1 kg    History of present illness:  HPI per Dr. Kirby Crigler Cynthia Young is a 89 y.o. female with medical history significant of Mobitz type I Wenckebach AV block/symptomatic bradycardia previously evaluated by EP and pacemaker implantation was deferred, hyperlipidemia, hypothyroidism presented to ED from PCPs office due to concern for bradycardia, chest pain, and hypotension. Heart rate 28-40 at PCPs office and blood pressure 84/44.     In the ED, patient noted to be hypertensive with SBP 180-200.  EKG done in the ED concerning for Mobitz type II AV block with heart rate 27. Troponin 15> 18.  Chest x-ray showing no acute cardiopulmonary process.  EDP consulted cardiology (Dr. Cristal Deer) and patient sent to [facility] for EP evaluation.  Patient was given IV hydralazine 5 mg in the ED.   Patient states she went to see her PCP this morning as she was having left-sided chest discomfort.  Also reporting dizziness/lightheadedness and fatigue.  Chest discomfort has persisted throughout the day.  Patient states her cardiologist had spoken to her about her low  heart rate in the past and at that time decision was made not to implant a pacemaker.  She has no other complaints.  Hospital Course:  #1 Mobitz type II AV block/symptomatic bradycardia -Patient noted to have heart rates in the 20s at PCPs office reportedly was noted to be hypotensive with complaints of some chest pain/discomfort. -Patient when seen in the ED noted to have systolic blood pressures in the 180s to 200s with heart rates in the low 30s to 40s. -Patient not on AV nodal blocking agents. -TSH within normal limits. Free T4-within normal limits 1.05. -It is noted that patient was previously evaluated by EP pacemaker implantation deferred at that time. -Cardiology consulted and EP consulted who assessed patient and per his note difficult to correlate symptoms with her bradycardia.   -Per EP patient did not have any symptoms with exertion while ambulating in the hallways and actually reported she felt better after exertion which per EP will go against high-grade AV block.   -Per EP no evidence of high-grade AV block on telemetry or EKGs and patient has been hypertensive despite bradycardia.   -Per EP no noted evidence of hemodynamically unstable bradycardia and does not feel at this time there is an absolute indication for PPM and patient does not want pacemaker at this time.   -EP recommended 2D echo which was done with a EF of 60 to 65%, NWMA, right ventricular systolic function normal, right ventricular size normal, mild MVR, no evidence of mitral stenosis.  Trivial aortic valve regurgitation.  Mild aortic valve stenosis.   -Per cardiology/EP no further workup needed at  this time with outpatient follow-up with the EP.   -Patient will be discharged in stable and improved condition.    2.  Chest discomfort -Likely secondary to problem #1. -High-sensitivity troponin 15>> 18 not consistent with ACS. -Chest x-ray with no acute cardiopulmonary process. -Patient seen by cardiology and no  further ischemic workup indicated per cardiology.   -Chest pain possibly GI component, patient advised to take Pepcid daily for the next 2 weeks and follow-up with PCP for further evaluation.   3.  Elevated blood pressure -Patient with no prior history of hypertension. -Avoid hypotension given bradycardia. -Patient maintained on IV hydralazine as needed. -BP remained stable.   4.  Hypothyroidism -TSH within normal limits. -Free T4 within normal limits. -Patient maintained on home regimen Synthroid.   5.  Hyperkalemia -Resolved.     Procedures: Chest x-ray 10/21/2023  2D echo 10/24/2023  Consultations: Cardiology: Dr. Jimmey Ralph 10/22/2023   Discharge Exam: Vitals:   10/24/23 0752 10/24/23 1350  BP: (!) 160/67 (!) 151/50  Pulse: (!) 57 (!) 33  Resp: 16 20  Temp: 97.9 F (36.6 C) 98.1 F (36.7 C)  SpO2: 99% 94%    General: NAD Cardiovascular: Bradycardia.  No murmurs rubs or gallops.  No JVD.  No lower extremity edema. Respiratory: Clear to auscultation bilaterally.  No wheezes, no crackles, no rhonchi.  Fair air movement.  Speaking in full sentences.  Discharge Instructions   Discharge Instructions     Diet - low sodium heart healthy   Complete by: As directed    Increase activity slowly   Complete by: As directed       Allergies as of 10/24/2023       Reactions   Tylenol [acetaminophen]    Generalized tremor        Medication List     TAKE these medications    famotidine 20 MG tablet Commonly known as: PEPCID Take 1 tablet (20 mg total) by mouth daily. What changed:  when to take this reasons to take this   levothyroxine 50 MCG tablet Commonly known as: SYNTHROID TAKE ONE TABLET BY MOUTH ONE TIME DAILY   Magnesium 250 MG Tabs Take 1 tablet (250 mg total) by mouth 2 (two) times daily.       Allergies  Allergen Reactions   Tylenol [Acetaminophen]     Generalized tremor    Follow-up Information     Bradd Canary, MD. Schedule an  appointment as soon as possible for a visit in 2 week(s).   Specialty: Family Medicine Contact information: [address] [place] [address] [phone]         Regan Lemming, MD Follow up on 11/24/2023.   Specialty: Cardiology Why: f/u at [facility] information: [address] [place] [address] [phone]                  The results of significant diagnostics from this hospitalization (including imaging, microbiology, ancillary and laboratory) are listed below for reference.    Significant Diagnostic Studies: ECHOCARDIOGRAM COMPLETE  Result Date: 10/24/2023    ECHOCARDIOGRAM REPORT   Patient Name:   SHARMEL BEITEL Date of Exam: 10/24/2023 Medical Rec #:  [id number]     Height:       59.0 in Accession #:    [id number]    Weight:       108.3 lb Date of Birth:  08/21/1930      BSA:  1.421 m Patient Age:    89 years      BP:           160/67 mmHg Patient Gender: F             HR:           30 bpm. Exam Location:  Inpatient Procedure: 2D Echo, Cardiac Doppler and Color Doppler Indications:    Abn ECG  History:        Patient has prior history of Echocardiogram examinations, most                 recent 05/23/2020. Arrythmias:Bradycardia; Risk                 Factors:Dyslipidemia.  Sonographer:    Meagan Baucom RDCS, FE, PE Referring Phys: [id number] Sundy Houchins V Jannette Cotham IMPRESSIONS  1. Left ventricular ejection fraction, by estimation, is 60 to 65%. The left ventricle has normal function. The left ventricle has no regional wall motion abnormalities. Left ventricular diastolic parameters were normal.  2. Right ventricular systolic function is normal. The right ventricular size is normal.  3. The mitral valve is abnormal. Mild mitral valve regurgitation. No evidence of mitral stenosis.  4. The aortic valve is tricuspid. There is moderate calcification of the aortic valve. There is moderate thickening of the aortic valve. Aortic valve regurgitation is  trivial. Mild aortic valve stenosis.  5. The inferior vena cava is normal in size with greater than 50% respiratory variability, suggesting right atrial pressure of 3 mmHg. FINDINGS  Left Ventricle: Left ventricular ejection fraction, by estimation, is 60 to 65%. The left ventricle has normal function. The left ventricle has no regional wall motion abnormalities. The left ventricular internal cavity size was normal in size. There is  no left ventricular hypertrophy. Left ventricular diastolic parameters were normal. Right Ventricle: The right ventricular size is normal. No increase in right ventricular wall thickness. Right ventricular systolic function is normal. Left Atrium: Left atrial size was normal in size. Right Atrium: Right atrial size was normal in size. Pericardium: There is no evidence of pericardial effusion. Mitral Valve: The mitral valve is abnormal. There is mild thickening of the mitral valve leaflet(s). There is mild calcification of the mitral valve leaflet(s). Mild mitral valve regurgitation. No evidence of mitral valve stenosis. Tricuspid Valve: The tricuspid valve is normal in structure. Tricuspid valve regurgitation is not demonstrated. No evidence of tricuspid stenosis. Aortic Valve: The aortic valve is tricuspid. There is moderate calcification of the aortic valve. There is moderate thickening of the aortic valve. Aortic valve regurgitation is trivial. Mild aortic stenosis is present. Aortic valve mean gradient measures 9.0 mmHg. Aortic valve peak gradient measures 17.6 mmHg. Aortic valve area, by VTI measures 2.10 cm. Pulmonic Valve: The pulmonic valve was normal in structure. Pulmonic valve regurgitation is not visualized. No evidence of pulmonic stenosis. Aorta: The aortic root is normal in size and structure. Venous: The inferior vena cava is normal in size with greater than 50% respiratory variability, suggesting right atrial pressure of 3 mmHg. IAS/Shunts: No atrial level shunt  detected by color flow Doppler.  LEFT VENTRICLE PLAX 2D LVIDd:         4.60 cm   Diastology LVIDs:         2.90 cm   LV e' medial:    8.38 cm/s LV PW:         1.00 cm   LV E/e' medial:  11.6 LV IVS:  0.80 cm   LV e' lateral:   7.62 cm/s LVOT diam:     2.10 cm   LV E/e' lateral: 12.8 LV SV:         123 LV SV Index:   86 LVOT Area:     3.46 cm  LEFT ATRIUM             Index        RIGHT ATRIUM           Index LA diam:        3.50 cm 2.46 cm/m   RA Area:     13.50 cm LA Vol (A2C):   55.0 ml 38.70 ml/m  RA Volume:   27.40 ml  19.28 ml/m LA Vol (A4C):   45.5 ml 32.02 ml/m LA Biplane Vol: 50.9 ml 35.82 ml/m  AORTIC VALVE AV Area (Vmax):    2.26 cm AV Area (Vmean):   2.21 cm AV Area (VTI):     2.10 cm AV Vmax:           209.50 cm/s AV Vmean:          136.000 cm/s AV VTI:            0.583 m AV Peak Grad:      17.6 mmHg AV Mean Grad:      9.0 mmHg LVOT Vmax:         137.00 cm/s LVOT Vmean:        86.600 cm/s LVOT VTI:          0.354 m LVOT/AV VTI ratio: 0.61  AORTA Ao Root diam: 2.70 cm MITRAL VALVE               TRICUSPID VALVE MV Area (PHT): 2.33 cm    TR Peak grad:   26.4 mmHg MV Decel Time: 325 msec    TR Vmax:        257.00 cm/s MV E velocity: 97.40 cm/s MV A velocity: 99.40 cm/s  SHUNTS MV E/A ratio:  0.98        Systemic VTI:  0.35 m                            Systemic Diam: 2.10 cm Charlton Haws MD Electronically signed by Charlton Haws MD Signature Date/Time: 10/24/2023/12:32:27 PM    Final    DG Chest Portable 1 View  Result Date: 10/21/2023 CLINICAL DATA:  cp EXAM: PORTABLE CHEST - 1 VIEW COMPARISON:  04/09/2020. FINDINGS: Cardiac silhouette is unremarkable. No pneumothorax or pleural effusion. The lungs are clear. Aorta is calcified. The visualized skeletal structures are unremarkable. IMPRESSION: No acute cardiopulmonary process. Electronically Signed   By: Layla Maw M.D.   On: 10/21/2023 14:51    Microbiology: Recent Results (from the past 240 hour(s))  Urine Culture     Status:  None   Collection Time: 10/21/23 11:50 AM   Specimen: Urine  Result Value Ref Range Status   MICRO NUMBER: [id number]  Final   SPECIMEN QUALITY: Adequate  Final   Sample Source URINE  Final   STATUS: FINAL  Final   Result: No Growth  Final     Labs: Basic Metabolic Panel: Recent Labs  Lab 10/21/23 1214 10/22/23 0930 10/22/23 1115 10/23/23 0417 10/24/23 0339  NA 138 138  --  139 140  K 4.7 5.2* 4.6 3.9 4.1  CL 105 108  --  111 109  CO2 24 22  --  20* 23  GLUCOSE 97 93  --  97 96  BUN 19 15  --  20 20  CREATININE 0.84 0.87  --  0.97 1.01*  CALCIUM 9.4 9.3  --  9.2 9.2  MG  --  2.2  --  2.2 2.1   Liver Function Tests: Recent Labs  Lab 10/21/23 1214  AST 21  ALT 12  ALKPHOS 60  BILITOT 0.8  PROT 7.1  ALBUMIN 4.0   Recent Labs  Lab 10/21/23 1214  LIPASE 28   No results for input(s): "AMMONIA" in the last 168 hours. CBC: Recent Labs  Lab 10/21/23 1214 10/22/23 0930 10/23/23 0417  WBC 9.1 8.6 8.4  NEUTROABS 5.4  --   --   HGB 13.9 14.9 13.2  HCT 43.4 44.3 41.3  MCV 93.9 90.4 93.9  PLT 256 PLATELET CLUMPS NOTED ON SMEAR, COUNT APPEARS DECREASED 248   Cardiac Enzymes: No results for input(s): "CKTOTAL", "CKMB", "CKMBINDEX", "TROPONINI" in the last 168 hours. BNP: BNP (last 3 results) No results for input(s): "BNP" in the last 8760 hours.  ProBNP (last 3 results) No results for input(s): "PROBNP" in the last 8760 hours.  CBG: No results for input(s): "GLUCAP" in the last 168 hours.     Signed:  Ramiro Harvest MD.  [facility] 10/24/2023, 2:50 PM

## 2023-10-24 NOTE — Progress Notes (Signed)
Mobility Specialist Progress Note:   10/24/23 1400  Mobility  Activity Ambulated with assistance in hallway  Level of Assistance Contact guard assist, steadying assist  Assistive Device Other (Comment) (HHA)  Distance Ambulated (ft) 180 ft  Activity Response Tolerated well  Mobility Referral Yes  Mobility visit 1 Mobility  Mobility Specialist Start Time (ACUTE ONLY) 1440  Mobility Specialist Stop Time (ACUTE ONLY) 1449  Mobility Specialist Time Calculation (min) (ACUTE ONLY) 9 min    Pre Mobility: 37 HR During Mobility: 50 HR Post Mobility:  40 HR  Pt received in bed, agreeable to mobility. Mod I for bed mobility and ambulated with HHA/CG. Asymptomatic throughout w/ no complaints. HR peaked at 50 bpm during ambulation. Pt left in bed with call bell and all needs met. RN notified of performance.  D'Vante Earlene Plater Mobility Specialist Please contact via Special educational needs teacher or Rehab office at [phone]

## 2023-10-24 NOTE — Progress Notes (Addendum)
  Patient Name: Cynthia Young Date of Encounter: 10/24/2023  Primary Cardiologist: Reatha Harps, MD Electrophysiologist: Regan Lemming, MD  Interval Summary   Slept well overnight, anxious about plan.  Denies chest pain, chest pressure, SOB   Vital Signs    Vitals:   10/23/23 1713 10/23/23 1939 10/23/23 2353 10/24/23 0328  BP: 137/64 134/67 (!) 144/47 (!) 152/68  Pulse: (!) 31 (!) 35 (!) 31 (!) 53  Resp: 16 16 16 16   Temp: 98.1 F (36.7 C) 98 F (36.7 C) 97.9 F (36.6 C) (!) 97.5 F (36.4 C)  TempSrc: Oral Oral Oral Oral  SpO2: 97% 97% 96% 96%  Weight:      Height:        Intake/Output Summary (Last 24 hours) at 10/24/2023 0734 Last data filed at 10/23/2023 0951 Gross per 24 hour  Intake 160 ml  Output --  Net 160 ml   Filed Weights   10/21/23 1224 10/21/23 2027  Weight: 49.9 kg 49.1 kg    Physical Exam    GEN- The patient is well appearing, alert and oriented x 3 today.   Lungs- Clear to ausculation bilaterally, normal work of breathing Cardiac- Regular, bradycardic rate and rhythm, no murmurs, rubs or gallops GI- soft, NT, ND, + BS Extremities- no clubbing or cyanosis. No edema  Telemetry    2:1 AV block as well as wenckebach Also having periods of 1:1 conduction up to 60-80s  (personally reviewed)  Hospital Course    Cynthia Young is a 89 y.o. female admitted for HLD, Hypothyroid, HTN.  Previously evaluated in 2022 for  Assessment & Plan    #) 2:1 AV block #) Mobitz 1 block #) hypothyroid HDS without symptoms while ambulating with nursing staff Difficult to correlate symptoms with her bradycardia. She has days of decreased energy Pending updated TTE Thyroid labs normal Consider zio at discharge to correlate fatigue with AV block        For questions or updates, please contact [facility] Please consult [email or web address] for contact info under Cardiology/STEMI.  Signed, Sherie Don, NP  10/24/2023, 7:34 AM   I have seen and  examined this patient with Sherie Don.  Agree with above, note added to reflect my findings.  Patient lying in bed feeling well without complaint.  GEN: Well nourished, well developed, in no acute distress  HEENT: normal  Neck: no JVD, carotid bruits, or masses Cardiac: Bradycardic, no edema  Respiratory:  clear to auscultation bilaterally, normal work of breathing GI: soft, nontender, nondistended, + BS MS: no deformity or atrophy  Skin: warm and dry Neuro:  Strength and sensation are intact Psych: euthymic mood, full affect   Second-degree AV block: Patient has no obvious symptoms.  She is bradycardic while at rest, but heart rates do increase when she exerts herself.  She has been able to do all of her ADLs.  Cynthia Young plan for echo today.  If she continues to ambulate without symptoms with improvements in her heart rate, no further workup for pacemaker would be indicated.  Cynthia Young hold off on pacing for now.  Cynthia Young M. Cynthia Banks MD 10/24/2023 8:53 AM

## 2023-10-24 NOTE — Plan of Care (Signed)

## 2023-10-24 NOTE — Progress Notes (Signed)
Transition of Care [facility]) - Inpatient Brief Assessment   Patient Details  Name: Cynthia Young MRN: [id number] Date of Birth: 28-Jun-1930  Transition of Care [facility]) CM/SW Contact:    Ronny Bacon, RN Phone Number: 10/24/2023, 11:43 AM   Clinical Narrative:  Patient from home with Bradycardia. Patient to have ECHO today and possible discharge home after.  Transition of Care Asessment: Insurance and Status: (P) Insurance coverage has been reviewed Patient has primary care physician: (P) Yes Home environment has been reviewed: (P) House   Prior/Current Home Services: (P) No current home services Social Determinants of Health Reivew: (P) SDOH reviewed no interventions necessary Readmission risk has been reviewed: (P) Yes Transition of care needs: (P) no transition of care needs at this time

## 2023-10-25 ENCOUNTER — Telehealth: Payer: Self-pay | Admitting: *Deleted

## 2023-10-25 NOTE — Transitions of Care (Post Inpatient/ED Visit) (Signed)
   10/25/2023  Name: Cynthia Young MRN: [id number] DOB: 03-Mar-1930  Today's TOC FU Call Status: Today's TOC FU Call Status:: Successful TOC FU Call Completed TOC FU Call Complete Date: 10/25/23 Patient's Name and Date of Birth confirmed.  Transition Care Management Follow-up Telephone Call Date of Discharge: 10/24/23 Discharge Facility: [facility]) Type of Discharge: Inpatient Admission Primary Inpatient Discharge Diagnosis:: Bradycardia How have you been since you were released from the hospital?: Better Any questions or concerns?: No  Items Reviewed: Did you receive and understand the discharge instructions provided?: Yes Medications obtained,verified, and reconciled?: Yes (Medications Reviewed) Any new allergies since your discharge?: No Dietary orders reviewed?: No Do you have support at home?: Yes People in Home: alone Name of Support/Comfort Primary Source: Harriett Sine and [place] daughters  Medications Reviewed Today: Medications Reviewed Today     Reviewed by Luella Cook, RN (Case Manager) on 10/25/23 at 1208  Med List Status: <None>   Medication Order Taking? Sig Documenting Provider Last Dose Status Informant  famotidine (PEPCID) 20 MG tablet [id number] Yes Take 1 tablet (20 mg total) by mouth daily.  Patient taking differently: Take 20 mg by mouth as needed for heartburn or indigestion.   Meredith Pel, NP Taking Active Self, Pharmacy Records  levothyroxine (SYNTHROID) 50 MCG tablet [id number] Yes TAKE ONE TABLET BY MOUTH ONE TIME DAILY  Patient taking differently: Take 50 mcg by mouth daily.   Bradd Canary, MD Taking Active Self, Pharmacy Records  Magnesium 250 MG TABS [id number] Yes Take 1 tablet (250 mg total) by mouth 2 (two) times daily. Bradd Canary, MD Taking Active Self, Pharmacy Records            Home Care and Equipment/Supplies: Were Home Health Services Ordered?: NA Any new equipment or medical supplies ordered?: NA  Functional  Questionnaire: Do you need assistance with bathing/showering or dressing?: No Do you need assistance with meal preparation?: No Do you need assistance with eating?: No Do you have difficulty maintaining continence: No Do you need assistance with getting out of bed/getting out of a chair/moving?: No Do you have difficulty managing or taking your medications?: No  Follow up appointments reviewed: PCP Follow-up appointment confirmed?: No MD Provider Line [phone] Given: Yes (patient will have daughter make appointment to fit with her schedule since she will be the transportation) Specialist Hospital Follow-up appointment confirmed?: Yes Date of Specialist follow-up appointment?: 11/23/22 Follow-Up Specialty Provider:: Dr Elberta Fortis Do you need transportation to your follow-up appointment?: No Do you understand care options if your condition(s) worsen?: Yes-patient verbalized understanding  SDOH Interventions Today    Flowsheet Row Most Recent Value  SDOH Interventions   Food Insecurity Interventions Intervention Not Indicated  Housing Interventions Intervention Not Indicated  Transportation Interventions Intervention Not Indicated, Patient Resources (Friends/Family)  [Daughter will take her to appt]  Utilities Interventions Intervention Not Indicated     Patient declined f/u with Care Coordination RN discussed with patient the importance of follow up appointment with PCP  per Hospitalist On follow-up patient will need a basic metabolic profile done to follow-up on electrolytes and renal function.  Patient's chest pain will need to be assessed for GI etiologies. TOC interventions discussed/reviewed: -Discussed/reviewed insurance/health plans benefits -Doctor visit discussed/reviewed -PCP -Doctor visits discussed/reviewed-Specialist -Provided Verbal Education: 30-day TOC program, nutrition, meds & their functions, symptom mgmt., fall/safety measures in the home  Gean Maidens BSN RN Population Health- Transition of Care Team.  [facility] [phone]

## 2023-10-26 ENCOUNTER — Telehealth: Payer: Self-pay | Admitting: Family Medicine

## 2023-10-26 NOTE — Telephone Encounter (Signed)
Patient was in the hospital on 10/21/23 and they advised to make a follow up with Dr. Abner Greenspan.  She was wondering why she had to follow up with Dr. Abner Greenspan.  She has an follow up appointment with cardiologist on 11/23/22.  She thinks seeing [place] is a little too much.  So do she need to see [place]?

## 2023-10-26 NOTE — Telephone Encounter (Signed)
Patient called to advise her cardiologist recommended her to follow up with PCP before seeing him in January. There are no openings. Please advise and if and when patient can be worked in. Pt would like after 10 if at all possible.

## 2023-10-28 NOTE — Telephone Encounter (Signed)
Pt will just follow up in March.

## 2023-11-02 ENCOUNTER — Telehealth: Payer: Self-pay

## 2023-11-02 NOTE — Telephone Encounter (Signed)
Copied from CRM [id number]. Topic: Clinical - Medical Advice >> Nov 02, 2023 11:04 AM Cynthia Young wrote: Reason for CRM: Patient believes she is experiencing hyperglycemia and wanted to know if Dr.Blyth tested for this during her last visit on 10/21/2023. She is experiencing dizziness and gets hungry often, She eats but doesn't gain weight she's actually losing weight.

## 2023-11-03 NOTE — Telephone Encounter (Signed)
 Called patient. Instructions given per provider's notes

## 2023-11-21 NOTE — Progress Notes (Deleted)
 Cardiology Office Note:  .   Date:  11/21/2023  ID:  Cynthia Young, DOB 09-30-1930, MRN [id number] PCP: Domenica Harlene LABOR, MD  [facility] Providers Cardiologist:  Darryle ONEIDA Decent, MD Electrophysiologist:  Soyla Gladis Norton, MD {  History of Present Illness: Cynthia   Charnay Young is a 89 y.o. female w/PMHx of HLD, hypothyroidism, known conduction system disease, Mobitz I and 2:1  She was admitted 10/22/23, had gone to her PMD office for itching on her abdomen, her HR slow by vitals in the 30's and referred to the ER, she reported otherwise feeling well Known hx of Mobitz Evaluated historically by EP and not felt to have any symptoms of bradycardia and no PPM recommended  This admission, EP saw her, most of her EKGs here are either 2-1 AV block or Mobitz 1 AV block.  While on telemetry she also has stretches of both Mobitz 1 AV block and 2-1 AV block.  I suspect that her 2-1 block is at the level of the AV node given that she has a narrow QRS at baseline.  AGAIN, unable to correlate any symptoms to her bradycardia/AV block. The pt hoping to avoid pacer Monitored a couple days  She ambulated in the halls without symptoms and without attributable symptoms associated with her bradycardia, no pacer recommended She also was noted to have 1:1 conduction with rates 60-80's EP s/o 10/24/23 Discharged the same day    Today's visit is scheduled as post hospital follow up  ROS:   *** symptoms, syncope *** ? Fatigue reported in the hospital as intermittent or occassional *** avoid nodal blocking agents    Studies Reviewed: Cynthia    EKG done today and reviewed by myself:  ***   10/24/23: TTE 1. Left ventricular ejection fraction, by estimation, is 60 to 65%. The  left ventricle has normal function. The left ventricle has no regional  wall motion abnormalities. Left ventricular diastolic parameters were  normal.   2. Right ventricular systolic function is normal. The right ventricular   size is normal.   3. The mitral valve is abnormal. Mild mitral valve regurgitation. No  evidence of mitral stenosis.   4. The aortic valve is tricuspid. There is moderate calcification of the  aortic valve. There is moderate thickening of the aortic valve. Aortic  valve regurgitation is trivial. Mild aortic valve stenosis.   5. The inferior vena cava is normal in size with greater than 50%  respiratory variability, suggesting right atrial pressure of 3 mmHg.   Risk Assessment/Calculations:    Physical Exam:   VS:  There were no vitals taken for this visit.   Wt Readings from Last 3 Encounters:  10/21/23 108 lb 4.8 oz (49.1 kg)  10/21/23 110 lb (49.9 kg)  07/28/23 110 lb (49.9 kg)    GEN: Well nourished, well developed in no acute distress NECK: No JVD; No carotid bruits CARDIAC: ***RRR, no murmurs, rubs, gallops RESPIRATORY:  *** CTA b/l without rales, wheezing or rhonchi  ABDOMEN: Soft, non-tender, non-distended EXTREMITIES: *** No edema; No deformity    ASSESSMENT AND PLAN: .    Conduction system disease Mobitz one, 2:1, felt to be at the level of the AVnode, no advanced heart block noted during her hospitalization Asymptomatic bradycardia ***      {Are you ordering a CV Procedure (e.g. stress test, cath, DCCV, TEE, etc)?   Press F2        :[id number]}     Dispo: ***  Signed,  Doneisha Ivey Lynn Cleburne Savini, PA-C

## 2023-11-24 ENCOUNTER — Ambulatory Visit: Payer: Medicare Other | Admitting: Cardiology

## 2023-11-24 ENCOUNTER — Ambulatory Visit: Payer: Medicare Other | Admitting: Physician Assistant

## 2023-11-25 ENCOUNTER — Ambulatory Visit: Payer: Medicare Other | Admitting: Cardiology

## 2023-12-09 ENCOUNTER — Other Ambulatory Visit: Payer: Self-pay | Admitting: *Deleted

## 2023-12-09 DIAGNOSIS — I8393 Asymptomatic varicose veins of bilateral lower extremities: Secondary | ICD-10-CM

## 2023-12-23 ENCOUNTER — Encounter (HOSPITAL_COMMUNITY): Payer: Medicare Other

## 2023-12-23 ENCOUNTER — Ambulatory Visit (INDEPENDENT_AMBULATORY_CARE_PROVIDER_SITE_OTHER): Payer: Medicare Other | Admitting: Family

## 2023-12-23 ENCOUNTER — Encounter: Payer: Medicare Other | Admitting: Vascular Surgery

## 2023-12-23 ENCOUNTER — Ambulatory Visit (HOSPITAL_BASED_OUTPATIENT_CLINIC_OR_DEPARTMENT_OTHER)
Admission: RE | Admit: 2023-12-23 | Discharge: 2023-12-23 | Disposition: A | Discharge status: Home / Self Care | Payer: Medicare Other | Source: Ambulatory Visit | Attending: Family | Admitting: Family

## 2023-12-23 VITALS — BP 148/54 | HR 35 | Resp 16 | Ht 58.3 in | Wt 107.0 lb

## 2023-12-23 DIAGNOSIS — J40 Bronchitis, not specified as acute or chronic: Secondary | ICD-10-CM | POA: Insufficient documentation

## 2023-12-23 DIAGNOSIS — R918 Other nonspecific abnormal finding of lung field: Secondary | ICD-10-CM | POA: Diagnosis not present

## 2023-12-23 DIAGNOSIS — R001 Bradycardia, unspecified: Secondary | ICD-10-CM

## 2023-12-23 DIAGNOSIS — Z9189 Other specified personal risk factors, not elsewhere classified: Secondary | ICD-10-CM | POA: Diagnosis not present

## 2023-12-23 DIAGNOSIS — R062 Wheezing: Secondary | ICD-10-CM | POA: Diagnosis not present

## 2023-12-23 DIAGNOSIS — R059 Cough, unspecified: Secondary | ICD-10-CM | POA: Diagnosis not present

## 2023-12-23 MED ORDER — CEFDINIR 300 MG PO CAPS
300.0000 mg | ORAL_CAPSULE | Freq: Two times a day (BID) | ORAL | 0 refills | Status: DC
Start: 2023-12-23 — End: 2024-01-17

## 2023-12-23 MED ORDER — BENZONATATE 100 MG PO CAPS: 100.0000 mg | ORAL_CAPSULE | Freq: Three times a day (TID) | ORAL | 0 refills | Status: DC | PRN

## 2023-12-23 NOTE — Progress Notes (Signed)
 Subjective:     Patient ID: Cynthia Young, female    DOB: 1930/04/23, 89 y.o.   MRN: [id number]  Chief Complaint  Patient presents with   Generalized Body Aches    Complains of having body aches since 2/01, better now   Cough    Complains of cough   Nasal Congestion    Complains of nasal congestion    HPI  Discussed the use of AI scribe software for clinical note transcription with the patient, who gave verbal consent to proceed.  History of Present Illness   Cynthia Young is a 89 year old female who presents with flu-like symptoms and concerns about pneumonia.  She has been experiencing flu-like symptoms since January 1st, 2025, with a sudden onset of body aches around 6 PM after her daughter left her house. Symptoms include severe headaches, body aches, sneezing, and coughing, described as 'awful', along with feeling cold and chilly, and having heavy and burning eyes. She has been taking Mucinex for the past few days, which has provided some relief but not complete resolution of symptoms.  She is concerned about the possibility of pneumonia or bronchitis, given her history of bronchitis. No current shortness of breath is reported, but she feels tired. She denies any current chest pain.  She has a history of low heart rate, which led to a hospital visit in early December where her heart rate was recorded at 35 beats per minute, significantly lower than the normal range. She canceled a follow-up appointment with a cardiologist as she was feeling fine at the time. She expresses concern about her heart rate and mentions feeling tired and weak, attributing this to both her current illness and her heart condition.  No urinary symptoms such as burning are present, but there is increased frequency of urination, attributed to increased fluid intake, specifically Gatorade, provided by her daughters. She also reports experiencing diarrhea during her illness.  She lives alone and reports not  eating well, relying on whatever food she has available. Her daughters occasionally bring her meals, but she cannot depend on this regularly. She is interested in potentially receiving Meals on Wheels to improve her nutrition.           Health Maintenance Due  Topic Date Due   DTaP/Tdap/Td (1 - Tdap) Never done    Past Medical History:  Diagnosis Date   Arthritis    Hyperglycemia 09/04/2020   Hyperlipidemia    Hypothyroid     Past Surgical History:  Procedure Laterality Date   BACK SURGERY     BIOPSY  04/12/2020   Procedure: BIOPSY;  Surgeon: Wilhelmenia Aloha Raddle., MD;  Location: [facility];  Service: Gastroenterology;;   ROMAYNE Left    CHOLECYSTECTOMY N/A 04/11/2020   Procedure: LAPAROSCOPIC CHOLECYSTECTOMY WITH INTRAOPERATIVE CHOLANGIOGRAM;  Surgeon: Ethyl Lenis, MD;  Location: [facility];  Service: General;  Laterality: N/A;   ERCP N/A 04/12/2020   Procedure: ENDOSCOPIC RETROGRADE CHOLANGIOPANCREATOGRAPHY (ERCP);  Surgeon: Wilhelmenia Aloha Raddle., MD;  Location: [facility];  Service: Gastroenterology;  Laterality: N/A;   FOOT SURGERY Right    bunion   REMOVAL OF STONES  04/12/2020   Procedure: REMOVAL OF STONES;  Surgeon: Wilhelmenia Aloha Raddle., MD;  Location: [facility];  Service: Gastroenterology;;   ANNETT  04/12/2020   Procedure: ANNETT;  Surgeon: Mansouraty, Aloha Raddle., MD;  Location: [facility];  Service: Gastroenterology;;    Family History  Problem Relation Age of Onset   Cancer Mother    Heart disease Father  Arthritis Father        rheumatoid   Cancer Brother        colon   Colon cancer Brother    Esophageal cancer Neg Hx    Inflammatory bowel disease Neg Hx    Liver disease Neg Hx    Pancreatic cancer Neg Hx    Stomach cancer Neg Hx     Social History   Socioeconomic History   Marital status: Widowed    Spouse name: Not on file   Number of children: Not on file   Years of education: Not on file   Highest education  level: Not on file  Occupational History   Not on file  Tobacco Use   Smoking status: Never   Smokeless tobacco: Never  Vaping Use   Vaping status: Never Used  Substance and Sexual Activity   Alcohol use: Yes    Comment: socially   Drug use: Never   Sexual activity: Not Currently  Other Topics Concern   Not on file  Social History Narrative   worked in [employer] office, short time. No cigarettes or drug use. Live by self no dietary restictions and walks daily   Social Drivers of Health   Financial Resource Strain: Low Risk  (09/23/2023)   Overall Financial Resource Strain (CARDIA)    Difficulty of Paying Living Expenses: Not hard at all  Food Insecurity: No Food Insecurity (10/25/2023)   Hunger Vital Sign    Worried About Running Out of Food in the Last Year: Never true    Ran Out of Food in the Last Year: Never true  Transportation Needs: No Transportation Needs (10/25/2023)   PRAPARE - Administrator, Civil Service (Medical): No    Lack of Transportation (Non-Medical): No  Physical Activity: Inactive (09/23/2023)   Exercise Vital Sign    Days of Exercise per Week: 0 days    Minutes of Exercise per Session: 0 min  Stress: No Stress Concern Present (09/23/2023)   [employer] of Occupational Health - Occupational Stress Questionnaire    Feeling of Stress : Not at all  Social Connections: Socially Isolated (09/23/2023)   Social Connection and Isolation Panel [NHANES]    Frequency of Communication with Friends and Family: Once a week    Frequency of Social Gatherings with Friends and Family: Once a week    Attends Religious Services: Never    Database Administrator or Organizations: No    Attends Banker Meetings: Never    Marital Status: Widowed  Intimate Partner Violence: Not At Risk (10/25/2023)   Humiliation, Afraid, Rape, and Kick questionnaire    Fear of Current or Ex-Partner: No    Emotionally Abused: No    Physically Abused: No     Sexually Abused: No    Outpatient Medications Prior to Visit  Medication Sig Dispense Refill   famotidine  (PEPCID ) 20 MG tablet Take 1 tablet (20 mg total) by mouth daily. (Patient taking differently: Take 20 mg by mouth as needed for heartburn or indigestion.) 30 tablet 5   levothyroxine  (SYNTHROID ) 50 MCG tablet TAKE ONE TABLET BY MOUTH ONE TIME DAILY (Patient taking differently: Take 50 mcg by mouth daily.) 90 tablet 1   Magnesium  250 MG TABS Take 1 tablet (250 mg total) by mouth 2 (two) times daily.     No facility-administered medications prior to visit.    Allergies  Allergen Reactions   Tylenol  [Acetaminophen ]     Generalized tremor  ROS See HPI    Objective:    Physical Exam Constitutional:      General: She is not in acute distress.    Appearance: Normal appearance. She is well-developed.  HENT:     Head: Normocephalic and atraumatic.     Right Ear: External ear normal.     Left Ear: External ear normal.  Eyes:     General: No scleral icterus. Neck:     Thyroid : No thyromegaly.  Cardiovascular:     Rate and Rhythm: Regular rhythm. Bradycardia present.     Heart sounds: Normal heart sounds. No murmur heard. Pulmonary:     Effort: Pulmonary effort is normal. No respiratory distress.     Breath sounds: Wheezing present.  Musculoskeletal:     Cervical back: Neck supple.  Skin:    General: Skin is warm and dry.  Neurological:     Mental Status: She is alert and oriented to person, place, and time.  Psychiatric:        Mood and Affect: Mood normal.        Behavior: Behavior normal.        Thought Content: Thought content normal.        Judgment: Judgment normal.      BP (!) 148/54 (BP Location: Right Arm, Patient Position: Sitting, Cuff Size: Small)   Pulse (!) 35   Resp 16   Ht 4' 10.3 (1.481 m)   Wt 107 lb (48.5 kg)   SpO2 98%   BMI 22.13 kg/m  Wt Readings from Last 3 Encounters:  12/23/23 107 lb (48.5 kg)  10/21/23 108 lb 4.8 oz (49.1 kg)   10/21/23 110 lb (49.9 kg)       Assessment & Plan:   Problem List Items Addressed This Visit       Unprioritized   Bronchitis   CXR is performed and is negative for pneumonia.  Given duration of symptoms, will plan empiric rx with cefdinir  and tessalon .  Pt is advised to call if symptoms do not improve.       Relevant Medications   benzonatate  (TESSALON ) 100 MG capsule   Other Relevant Orders   DG Chest 2 View (Completed)   Bradycardia - Primary   She does have significant fatigue.  Unclear if this is related to her bradycardia (35-40 bpm today in clinic) or her current illness.  She had a hospitalization for this back in early December.  It was recommended that she follow back up with EP as an outpatient.  This appointment was scheduled for early January but she cancelled it because I was feeling better. Upon review of her discharge summary from her hospitalization, her HR was in the 35-40 range at time of discharge.  EKG is performed today and notes Bradycardia, HR 40, LAFB, Personally reviewed by myself.  Machine questions AF, but I believe I see some P waves.  Reviewed previous EKG on file and it appears unchanged. I did reach out to Dr. Inocencio who was on vacation and did not have computer access.  He stated that if EKG unchanged probably OK.  I advised the patient and daughter to schedule follow up with Dr. Inocencio and also to have low threshold to go to the ER if increased weakness, shortness of breath.        Relevant Orders   EKG 12-Lead (Completed)   CBC w/Diff   Comp Met (CMET)   Other Visit Diagnoses       At high risk  for alteration in nutrition       Relevant Orders   AMB Referral VBCI Care Management      50 minutes spent on pt's visit today. Time was spent interviewing patient reviewing EKG/CXR, coordinating care with specialist and formulating complex medical plan.  I am having Angalina Hartin start on cefdinir  and benzonatate . I am also having her maintain her  famotidine , Magnesium , and levothyroxine .  Meds ordered this encounter  Medications   cefdinir  (OMNICEF ) 300 MG capsule    Sig: Take 1 capsule (300 mg total) by mouth 2 (two) times daily.    Dispense:  14 capsule    Refill:  0    Supervising Provider:   DOMENICA BLACKBIRD A [[id number]]   benzonatate  (TESSALON ) 100 MG capsule    Sig: Take 1 capsule (100 mg total) by mouth 3 (three) times daily as needed.    Dispense:  20 capsule    Refill:  0    Supervising Provider:   DOMENICA BLACKBIRD A [[id number]]

## 2023-12-23 NOTE — Patient Instructions (Addendum)
 Please call Dr. Carolyn office to request an appointment as soon as possible. [phone]  VISIT SUMMARY:  Today, we discussed your flu-like symptoms and concerns about pneumonia. We also reviewed your history of low heart rate and your current nutritional status.  YOUR PLAN:  -BRADYCARDIA: Bradycardia means having a slower than normal heart rate. You were previously hospitalized for this issue and have not followed up with cardiology as recommended. Please reschedule your appointment with Dr. Thedore to discuss further evaluation and the potential need for a pacemaker.  -ACUTE RESPIRATORY ILLNESS: An acute respiratory illness is a sudden infection that affects your breathing. Your symptoms are consistent with a recent viral illness, including cough, congestion, and general malaise. We will order a chest x-ray to rule out pneumonia, check your blood electrolytes, and consider antibiotic therapy based on the imaging results.  -POOR NUTRITION: Poor nutrition means not getting enough nutrients from your diet. Since you live alone and report not eating well, we will refer you to social work to set up Meals on Wheels to help improve your nutrition.  INSTRUCTIONS:  Please reschedule your appointment with Dr. Thedore for further evaluation of your heart rate and potential pacemaker placement. Additionally, follow up with the chest x-ray and blood tests as ordered. We will also arrange for social work to contact you about Meals on Wheels.

## 2023-12-24 DIAGNOSIS — J40 Bronchitis, not specified as acute or chronic: Secondary | ICD-10-CM | POA: Insufficient documentation

## 2023-12-24 DIAGNOSIS — R001 Bradycardia, unspecified: Secondary | ICD-10-CM | POA: Insufficient documentation

## 2023-12-24 NOTE — Assessment & Plan Note (Signed)
 She does have significant fatigue.  Unclear if this is related to her bradycardia (35-40 bpm today in clinic) or her current illness.  She had a hospitalization for this back in early December.  It was recommended that she follow back up with EP as an outpatient.  This appointment was scheduled for early January but she cancelled it because I was feeling better. Upon review of her discharge summary from her hospitalization, her HR was in the 35-40 range at time of discharge.  EKG is performed today and notes Bradycardia, HR 40, LAFB, Personally reviewed by myself.  Machine questions AF, but I believe I see some P waves.  Reviewed previous EKG on file and it appears unchanged. I did reach out to Dr. Inocencio who was on vacation and did not have computer access.  He stated that if EKG unchanged probably OK.  I advised the patient and daughter to schedule follow up with Dr. Inocencio and also to have low threshold to go to the ER if increased weakness, shortness of breath.

## 2023-12-24 NOTE — Assessment & Plan Note (Signed)
 CXR is performed and is negative for pneumonia.  Given duration of symptoms, will plan empiric rx with cefdinir  and tessalon .  Pt is advised to call if symptoms do not improve.

## 2023-12-27 ENCOUNTER — Ambulatory Visit: Payer: Medicare Other | Admitting: Pulmonary Disease

## 2023-12-27 NOTE — Progress Notes (Deleted)
  Electrophysiology Office Note:   Date:  12/27/2023  ID:  Cynthia Young, DOB 1930-05-08, MRN [id number]  Primary Cardiologist: Reatha Harps, MD Primary Heart Failure: None Electrophysiologist: Will Jorja Loa, MD  {Click to update primary MD,subspecialty MD or APP then REFRESH:1}    History of Present Illness:   Cynthia Young is a 89 y.o. female with h/o Mobitz I AVB, hypothyroidism seen today for routine electrophysiology followup.   Seen in December 2024 in hospital for second degree AVB, she was bradycardic at rest but had demonstrated chronotropic competence with exertion. ECHO showed normal LV function.  Decision for pacing was held at that time.   Since last being seen in our clinic the patient reports doing ***.  she denies chest pain, palpitations, dyspnea, PND, orthopnea, nausea, vomiting, dizziness, syncope, edema, weight gain, or early satiety.   Review of systems complete and found to be negative unless listed in HPI.   EP Information / Studies Reviewed:    EKG is ordered today. Personal review as below.      Studies:  Cardiac Monitor 06/2021 > 2 episodes of 2:1 block, second degree AV block, Mobitz 1 ECHO 10/2023 > LVEF 60-65%    Arrhythmia / AAD 2nd Degree AVB    Risk Assessment/Calculations:     No BP recorded.  {Refresh Note OR Click here to enter BP  :1}***        Physical Exam:   VS:  There were no vitals taken for this visit.   Wt Readings from Last 3 Encounters:  12/23/23 107 lb (48.5 kg)  10/21/23 108 lb 4.8 oz (49.1 kg)  10/21/23 110 lb (49.9 kg)     GEN: Well nourished, well developed in no acute distress NECK: No JVD; No carotid bruits CARDIAC: {EPRHYTHM:[id number]}, no murmurs, rubs, gallops RESPIRATORY:  Clear to auscultation without rales, wheezing or rhonchi  ABDOMEN: Soft, non-tender, non-distended EXTREMITIES:  No edema; No deformity   ASSESSMENT AND PLAN:    Second Degree AV Block  2:1 AVB Recent admit, difficult to correlate  symptoms with her bradycardia as she did not endorse any known symptoms associated with low heart rates -EKG ***  -improved post hospitalization, energy levels ok *** -currently no absolute indication for PPM, pt hopeful to avoid as well *** -consider ZIO ***  Hypothyroidism  -per PCP   Follow up with Dr. Elberta Fortis in 3 months  Signed, Canary Brim, NP-C, AGACNP-BC [facility] - Electrophysiology  12/27/2023, 8:04 AM

## 2023-12-30 ENCOUNTER — Telehealth: Payer: Self-pay

## 2023-12-30 NOTE — Progress Notes (Signed)
   Telephone encounter was:  Unsuccessful.  12/30/2023 Name: Cynthia Young MRN: [id number] DOB: 1930-05-04  Unsuccessful outbound call made today to assist with:  Food Insecurity  Outreach Attempt:  1st Attempt  A HIPAA compliant voice message was left requesting a return call.  Instructed patient to call back    Lenard Forth [facility] Guide, Phone: [phone] Fax: [phone] Website: [email or web address]

## 2024-01-02 ENCOUNTER — Ambulatory Visit: Payer: Medicare Other | Admitting: Physician Assistant

## 2024-01-03 ENCOUNTER — Telehealth: Payer: Self-pay

## 2024-01-03 NOTE — Progress Notes (Signed)
   Telephone encounter was:  Unsuccessful.  01/03/2024 Name: Naomia Lenderman MRN: [id number] DOB: Jul 31, 1930  Unsuccessful outbound call made today to assist with:  Food Insecurity  Outreach Attempt:  2nd Attempt  Unable to leave a message    Lenard Forth [facility] Guide, Phone: [phone] Fax: [phone] Website: [email or web address]

## 2024-01-04 ENCOUNTER — Telehealth: Payer: Self-pay

## 2024-01-04 NOTE — Progress Notes (Signed)
   Telephone encounter was:  Unsuccessful.  01/04/2024 Name: Cynthia Young MRN: [id number] DOB: 1930/02/01  Unsuccessful outbound call made today to assist with:  Food Insecurity  Outreach Attempt:  3rd Attempt.  Referral closed unable to contact patient.  A HIPAA compliant voice message was left requesting a return call.  Instructed patient to call back    Lenard Forth [facility] Guide, Phone: [phone] Fax: [phone] Website: [email or web address]

## 2024-01-12 NOTE — Progress Notes (Unsigned)
 Electrophysiology Office Note:   Date:  01/17/2024  ID:  Cynthia Young, DOB August 11, 1930, MRN [id number]  Primary Cardiologist: Reatha Harps, MD Primary Heart Failure: None Electrophysiologist: Nobie Putnam, MD      History of Present Illness:   Cynthia Young is a 89 y.o. female with h/o Mobitz I AVB, hypothyroidism seen today for routine electrophysiology followup.   Seen in December 2024 in hospital for second degree AVB, she was bradycardic at rest but had demonstrated chronotropic competence with exertion. ECHO showed normal LV function.  Decision for pacing was held at that time.   Since last being seen in our clinic the patient reports she had the flu approx 3-4 weeks ago & has been fatigued. She is unsure if fatigue is related to her flu symptoms or if it is her heart. She wakes at night with her "heart pounding". She feels lightheaded at times and dizzy, & as though her legs are heavy. She continues to do all her ADL's as usual.  She states she has ADHD and is constantly on the go.  She has stopped driving due to her lightheadedness and concern for an accident.   She denies chest pain, palpitations, dyspnea, PND, orthopnea, nausea, vomiting, dizziness, syncope, edema, weight gain, or early satiety.   Review of systems complete and found to be negative unless listed in HPI.   EP Information / Studies Reviewed:    EKG is ordered today. Personal review as below.  EKG Interpretation Date/Time:  Tuesday January 17 2024 08:40:25 EST Ventricular Rate:  32 PR Interval:    QRS Duration:  108 QT Interval:  460 QTC Calculation: 335 R Axis:   -64  Text Interpretation: Mobitz I 2-degree AV block (Wenckebach block) Left anterior fascicular block Left ventricular hypertrophy Confirmed by Canary Brim ([id number]) on 01/17/2024 8:48:44 AM   Studies:  Cardiac Monitor 06/2021 > 2 episodes of 2:1 block, second degree AV block, Mobitz 1 ECHO 10/2023 > LVEF 60-65%    Arrhythmia / AAD 2nd Degree AVB     Risk Assessment/Calculations:     Physical Exam:   VS:  BP (!) 142/60   Pulse (!) 32   Ht 4\' 11"  (1.499 m)   Wt 106 lb 9.6 oz (48.4 kg)   SpO2 96%   BMI 21.53 kg/m    Wt Readings from Last 3 Encounters:  01/17/24 106 lb 9.6 oz (48.4 kg)  12/23/23 107 lb (48.5 kg)  10/21/23 108 lb 4.8 oz (49.1 kg)     GEN: Well nourished, well developed in no acute distress NECK: No JVD; No carotid bruits CARDIAC: Regular rate and rhythm, no murmurs, rubs, gallops RESPIRATORY:  Clear to auscultation without rales, wheezing or rhonchi  ABDOMEN: Soft, non-tender, non-distended EXTREMITIES:  No edema; No deformity   ASSESSMENT AND PLAN:    Second Degree AV Block  2:1 AVB Recent admit, difficult to correlate symptoms with her bradycardia as she did not endorse any known symptoms associated with low heart rates.  Normal LV function.  -EKG with Mobitz II, 2:1 AVB, unchanged from prior   -notes she had improvement post hospital admit but then flu made her fatigued, now having lightheadedness -encouraged compression stockings, fluid intake, modest increase in salt -currently no absolute indication for PPM, pt hopeful to avoid as well   -plan for Zio to ensure no change in conduction / advanced high grade AVB given lightheadedness  -avoid AV nodal blocking agents   Hypertension  -monitor for now  Hypothyroidism  -per  PCP   Follow up with Dr. Elberta Fortis in 4 weeks  Signed, Canary Brim, NP-C, AGACNP-BC [facility] - Electrophysiology  01/17/2024, 9:54 AM

## 2024-01-17 ENCOUNTER — Encounter: Payer: Self-pay | Admitting: Pulmonary Disease

## 2024-01-17 ENCOUNTER — Ambulatory Visit: Attending: Pulmonary Disease

## 2024-01-17 ENCOUNTER — Ambulatory Visit: Payer: Medicare Other | Attending: Pulmonary Disease | Admitting: Pulmonary Disease

## 2024-01-17 VITALS — BP 142/60 | HR 32 | Ht 59.0 in | Wt 106.6 lb

## 2024-01-17 DIAGNOSIS — I441 Atrioventricular block, second degree: Secondary | ICD-10-CM | POA: Diagnosis not present

## 2024-01-17 NOTE — Progress Notes (Unsigned)
 Enrolled for [employer] to mail a ZIO XT long term holter monitor to the patients address on file.   Dr. Elberta Fortis to read.

## 2024-01-17 NOTE — Patient Instructions (Addendum)
 Medication Instructions:  Your physician recommends that you continue on your current medications as directed. Please refer to the Current Medication list given to you today.  *If you need a refill on your cardiac medications before your next appointment, please call your pharmacy*  Lab Work: None ordered.  If you have labs (blood work) drawn today and your tests are completely normal, you will receive your results only by: MyChart Message (if you have MyChart)  If you have any lab test that is abnormal or we need to change your treatment, we will call you or send a MyChart message to review the results.  Testing/Procedures: Your physician has requested that you wear a Zio heart monitor for 7 days. This will be mailed to your home with instructions on how to apply the monitor and how to return it when finished. Please allow 2 weeks after returning the heart monitor before our office calls you with the results.   Follow-Up: At [facility], you and your health needs are our priority.  As part of our continuing mission to provide you with exceptional heart care, we have created designated Provider Care Teams.  These Care Teams include your primary Cardiologist (physician) and Advanced Practice Providers (APPs -  Physician Assistants and Nurse Practitioners) who all work together to provide you with the care you need, when you need it.  Your next appointment:   1 month  The format for your next appointment:   In Person  Provider:   Dr Sherryl Manges parking services will be available as well.    ZIO XT- Long Term Monitor Instructions     Your physician has requested you wear a ZIO patch monitor for 7 days.  This is a single patch monitor. [employer] supplies one patch monitor per enrollment. Additional  stickers are not available. Please do not apply patch if you will be having a Nuclear Stress Test,  Echocardiogram, Cardiac CT, MRI, or Chest Xray during the period you would  be wearing the  monitor. The patch cannot be worn during these tests. You cannot remove and re-apply the  ZIO XT patch monitor.  Your ZIO patch monitor will be mailed 3 day USPS to your address on file. It may take 3-5 days  to receive your monitor after you have been enrolled.  Once you have received your monitor, please review the enclosed instructions. Your monitor  has already been registered assigning a specific monitor serial # to you.     Billing and Patient Assistance Program Information     We have supplied [employer] with any of your insurance information on file for billing purposes.  [employer] offers a sliding scale Patient Assistance Program for patients that do not have  insurance, or whose insurance does not completely cover the cost of the ZIO monitor.  You must apply for the Patient Assistance Program to qualify for this discounted rate.  To apply, please call [employer] at [phone], select option 4, select option 2, ask to apply for  Patient Assistance Program. Meredeth Ide will ask your household income, and how many people  are in your household. They will quote your out-of-pocket cost based on that information.  [employer] will also be able to set up a 77-month, interest-free payment plan if needed.     Applying the monitor     Shave hair from upper left chest.  Hold abrader disc by orange tab. Rub abrader in 40 strokes  over the upper left chest as  indicated in your monitor instructions.  Clean area with 4 enclosed alcohol pads. Let dry.  Apply patch as indicated in monitor instructions. Patch will be placed under collarbone on left  side of chest with arrow pointing upward.  Rub patch adhesive wings for 2 minutes. Remove white label marked "1". Remove the white  label marked "2". Rub patch adhesive wings for 2 additional minutes.  While looking in a mirror, press and release button in center of patch. A small green light will  flash 3-4 times. This will be your only indicator  that the monitor has been turned on.  Do not shower for the first 24 hours. You may shower after the first 24 hours.  Press the button if you feel a symptom. You will hear a small click. Record Date, Time and  Symptom in the Patient Logbook.  When you are ready to remove the patch, follow instructions on the last 2 pages of Patient  Logbook. Stick patch monitor onto the last page of Patient Logbook.  Place Patient Logbook in the blue and white box. Use locking tab on box and tape box closed  securely. The blue and white box has prepaid postage on it. Please place it in the mailbox as  soon as possible. Your physician should have your test results approximately 7 days after the  monitor has been mailed back to [facility].  Call [facility] Customer Care at [phone] if you have questions regarding  your ZIO XT patch monitor. Call them immediately if you see an orange light blinking on your  monitor.  If your monitor falls off in less than 4 days, contact our Monitor department at [phone].  If your monitor becomes loose or falls off after 4 days call [employer] at [phone] for  suggestions on securing your monitor.

## 2024-01-20 DIAGNOSIS — I441 Atrioventricular block, second degree: Secondary | ICD-10-CM | POA: Diagnosis not present

## 2024-01-23 NOTE — Assessment & Plan Note (Deleted)
 On Levothyroxine, continue to monitor

## 2024-01-23 NOTE — Assessment & Plan Note (Deleted)
 Encourage heart healthy diet such as MIND or DASH diet, increase exercise, avoid trans fats, simple carbohydrates and processed foods, consider a krill or fish or flaxseed oil cap daily.

## 2024-01-23 NOTE — Assessment & Plan Note (Deleted)
 hgba1c acceptable, minimize simple carbs. Increase exercise as tolerated.

## 2024-01-26 ENCOUNTER — Ambulatory Visit: Payer: Medicare Other | Admitting: Family Medicine

## 2024-01-26 ENCOUNTER — Telehealth: Payer: Self-pay | Admitting: Family Medicine

## 2024-01-26 DIAGNOSIS — E039 Hypothyroidism, unspecified: Secondary | ICD-10-CM

## 2024-01-26 DIAGNOSIS — E785 Hyperlipidemia, unspecified: Secondary | ICD-10-CM

## 2024-01-26 DIAGNOSIS — R739 Hyperglycemia, unspecified: Secondary | ICD-10-CM

## 2024-01-26 NOTE — Telephone Encounter (Signed)
 Noted.

## 2024-01-26 NOTE — Telephone Encounter (Signed)
 Copied from CRM [id number]. Topic: Appointments - Appointment Cancel/Reschedule >> Jan 26, 2024 10:53 AM Pascal Lux wrote: Patient/patient representative is calling to cancel or reschedule an appointment for today, 3/13. Refer to attachments for appointment information. Patient stated she will not be able to make it to her appointment because she did not get a reminder from [place] and does not have a transportation.

## 2024-02-13 ENCOUNTER — Telehealth: Payer: Self-pay | Admitting: Cardiovascular Disease

## 2024-02-13 DIAGNOSIS — I441 Atrioventricular block, second degree: Secondary | ICD-10-CM | POA: Diagnosis not present

## 2024-02-13 NOTE — Telephone Encounter (Signed)
 Spoke to [facility]) about abnormal monitor results (report available under Media tab).  Alert was for Symptomatic Bradycardia from 01/20/24 at 4:45 am and 01/23/24 at 5:37 am 9:39 am.    Pt is established with EP (saw Veleta Miners, NP on 01/17/24) and has OV w/Dr. Elberta Fortis on 02/22/24.  Will route to NP for review/input.

## 2024-02-13 NOTE — Telephone Encounter (Signed)
 Caller [facility]) is reporting abnormal reading.

## 2024-02-16 NOTE — Telephone Encounter (Signed)
 Left message asking to call back with call back number.  Wanted to share the following message:  Jeanella Craze, NP  Cv Div Nl Triage6 minutes ago (4:11 PM)    Her monitor is consistent with her known conduction issues. There was no high grade block or prolonged pause that would warrant pacemaker consideration.  Will you please let her know her results and see if she is feeling better as she was recovering from the flu when I saw her and had some lightheadedness (why we ordered the monitor).    Merry Proud

## 2024-02-17 NOTE — Telephone Encounter (Signed)
 Patient identification verified by 2 forms. Cynthia Rail, RN    Called and spoke to patient  Relayed provider results below  Patient states:   -lightheadedness has slowed down   -the lightheadedness are not as frequent or as bad more   -she has completely recovered from the flu  Informed patient message sent to provider  Patient verbalized understanding, no questions at this time

## 2024-02-17 NOTE — Telephone Encounter (Signed)
 Pt returning call, she would prefer a call around 4pm if possible

## 2024-02-22 ENCOUNTER — Encounter: Payer: Self-pay | Admitting: Cardiology

## 2024-02-22 ENCOUNTER — Ambulatory Visit: Attending: Cardiology | Admitting: Cardiology

## 2024-02-22 VITALS — BP 138/78 | HR 32 | Ht 59.0 in | Wt 104.0 lb

## 2024-02-22 DIAGNOSIS — I10 Essential (primary) hypertension: Secondary | ICD-10-CM | POA: Diagnosis not present

## 2024-02-22 DIAGNOSIS — I441 Atrioventricular block, second degree: Secondary | ICD-10-CM | POA: Insufficient documentation

## 2024-02-22 NOTE — Patient Instructions (Addendum)
 Medication Instructions:  Your physician recommends that you continue on your current medications as directed. Please refer to the Current Medication list given to you today.  *If you need a refill on your cardiac medications before your next appointment, please call your pharmacy*  Lab Work: None ordered.  If you have labs (blood work) drawn today and your tests are completely normal, you will receive your results only by: MyChart Message (if you have MyChart) OR A paper copy in the mail If you have any lab test that is abnormal or we need to change your treatment, we will call you to review the results.  Testing/Procedures: None ordered.    Follow-Up: At [facility], you and your health needs are our priority.  As part of our continuing mission to provide you with exceptional heart care, our providers are all part of one team.  This team includes your primary Cardiologist (physician) and Advanced Practice Providers or APPs (Physician Assistants and Nurse Practitioners) who all work together to provide you with the care you need, when you need it.  Your next appointment:   3 months with Dr Elberta Fortis NP, [facility]         1st Floor: - Lobby - Registration  - Pharmacy  - Lab - Cafe  2nd Floor: - PV Lab - Diagnostic Testing (echo, CT, nuclear med)  3rd Floor: - Vacant  4th Floor: - TCTS (cardiothoracic surgery) - AFib Clinic - Structural Heart Clinic - Vascular Surgery  - Vascular Ultrasound  5th Floor: - HeartCare Cardiology (general and EP) - Clinical Pharmacy for coumadin, hypertension, lipid, weight-loss medications, and med management appointments    Valet parking services will be available as well.

## 2024-02-22 NOTE — Progress Notes (Signed)
  Electrophysiology Office Note:   Date:  02/22/2024  ID:  Cynthia Young, DOB November 09, 1930, MRN [id number]  Primary Cardiologist: Reatha Harps, MD Primary Heart Failure: None Electrophysiologist: Liora Myles Jorja Loa, MD      History of Present Illness:   Cynthia Young is a 89 y.o. female with h/o second-degree AV block, hypothyroidism seen today for routine electrophysiology followup.   Since last being seen in our clinic the patient reports doing overall well.  She has no chest pain.  She does have some mild shortness of breath and fatigue.  She recent wore a cardiac monitor which showed episodes of 2-1 AV block.  Her average heart rate was 37 bpm.  She presents today with heart rates in the 30s.  She states that she has some fatigue and weakness.  She is able to do her daily activities, but feels that she needs to rest more often.  She at times gets lightheaded and dizzy.  She complains of difficulty walking, but apparently has neuropathy.  she denies chest pain, palpitations, dyspnea, PND, orthopnea, nausea, vomiting, dizziness, syncope, edema, weight gain, or early satiety.   Review of systems complete and found to be negative unless listed in HPI.   EP Information / Studies Reviewed:    EKG is not ordered today. EKG from 01/17/24 reviewed which showed sinus rhythm, 2:1 AV block        Risk Assessment/Calculations:              Physical Exam:   VS:  BP 138/78 (BP Location: Left Arm, Patient Position: Sitting, Cuff Size: Normal)   Pulse (!) 32   Ht 4\' 11"  (1.499 m)   Wt 104 lb (47.2 kg)   SpO2 96%   BMI 21.01 kg/m    Wt Readings from Last 3 Encounters:  02/22/24 104 lb (47.2 kg)  01/17/24 106 lb 9.6 oz (48.4 kg)  12/23/23 107 lb (48.5 kg)     GEN: Well nourished, well developed in no acute distress NECK: No JVD; No carotid bruits CARDIAC: Bradycardic, regular, no murmurs, rubs, gallops RESPIRATORY:  Clear to auscultation without rales, wheezing or rhonchi  ABDOMEN: Soft,  non-tender, non-distended EXTREMITIES:  No edema; No deformity   ASSESSMENT AND PLAN:    1.  Second-degree 2-1 AV block: She is having lightheadedness and fatigue.  Her rates are quite slow, consistent with 2-1 AV block on her prior EKG.  I told her that she may benefit from pacemaker implant.  Risks and benefits have been discussed.  She Cynthia Young discuss this further with her family.  2.  Hypertension: Currently well-controlled  3.  Hypothyroidism: Plan per PCP  Follow up with EP APP in 3 months  Signed, Cynthia Julin Jorja Loa, MD

## 2024-04-06 ENCOUNTER — Other Ambulatory Visit: Payer: Self-pay | Admitting: Family Medicine

## 2024-04-25 ENCOUNTER — Encounter: Payer: Self-pay | Admitting: Family Medicine

## 2024-04-25 ENCOUNTER — Ambulatory Visit (HOSPITAL_BASED_OUTPATIENT_CLINIC_OR_DEPARTMENT_OTHER)
Admission: RE | Admit: 2024-04-25 | Discharge: 2024-04-25 | Disposition: A | Discharge status: Home / Self Care | Source: Ambulatory Visit | Attending: Family Medicine | Admitting: Family Medicine

## 2024-04-25 ENCOUNTER — Ambulatory Visit: Payer: Self-pay | Admitting: Family Medicine

## 2024-04-25 ENCOUNTER — Ambulatory Visit: Payer: Self-pay

## 2024-04-25 ENCOUNTER — Ambulatory Visit: Admitting: Family Medicine

## 2024-04-25 VITALS — BP 188/41 | HR 65 | Ht 59.0 in | Wt 109.0 lb

## 2024-04-25 DIAGNOSIS — W19XXXA Unspecified fall, initial encounter: Secondary | ICD-10-CM | POA: Diagnosis not present

## 2024-04-25 DIAGNOSIS — M25551 Pain in right hip: Secondary | ICD-10-CM | POA: Insufficient documentation

## 2024-04-25 DIAGNOSIS — M4316 Spondylolisthesis, lumbar region: Secondary | ICD-10-CM | POA: Insufficient documentation

## 2024-04-25 DIAGNOSIS — M533 Sacrococcygeal disorders, not elsewhere classified: Secondary | ICD-10-CM | POA: Diagnosis not present

## 2024-04-25 DIAGNOSIS — M461 Sacroiliitis, not elsewhere classified: Secondary | ICD-10-CM | POA: Diagnosis not present

## 2024-04-25 DIAGNOSIS — M47816 Spondylosis without myelopathy or radiculopathy, lumbar region: Secondary | ICD-10-CM | POA: Diagnosis not present

## 2024-04-25 DIAGNOSIS — M160 Bilateral primary osteoarthritis of hip: Secondary | ICD-10-CM | POA: Diagnosis not present

## 2024-04-25 DIAGNOSIS — I878 Other specified disorders of veins: Secondary | ICD-10-CM | POA: Diagnosis not present

## 2024-04-25 NOTE — Telephone Encounter (Signed)
 Pt has scheduled office visit today 04/25/2024.

## 2024-04-25 NOTE — Progress Notes (Signed)
 Acute Office Visit  Subjective:     Patient ID: Cynthia Young, female    DOB: 30-Nov-1929, 89 y.o.   MRN: [id number]  Chief Complaint  Patient presents with   Hip Pain   Fall     Patient is in today for right hip pain, fall. She is here with her daughter.   Discussed the use of AI scribe software for clinical note transcription with the patient, who gave verbal consent to proceed.  History of Present Illness Cynthia Young is a 89 year old female who presents with right-sided hip and lower back pain following a fall.  She experienced a fall while trying to kill a fly, resulting in a loss of balance and a fall. Initially, there was no significant bruising or pain, but she woke up the following day with severe pain, particularly when getting up or lying down. The pain is described as 'torture' when moving in and out of bed, taking up to twenty minutes to reposition.  The pain is primarily located on the right side, affecting her right lower back and hip area, with some discomfort extending to the groin. No visible bruising is noted in these areas, although there is a bruise on her right shoulder. The pain is exacerbated by movement, particularly when getting up from a seated position or turning in bed. She describes the pain as 'grabbing.'  She has taken Tylenol  since the fall without side effects. She also mentions a preference for Aleve, which she found effective in the past for swelling.  Her past medical history includes a previous fall resulting in a slight vertebral fracture, which required wearing a brace.         ROS All review of systems negative except what is listed in the HPI      Objective:    BP (!) 188/41   Pulse 65   Ht 4' 11 (1.499 m)   Wt 109 lb (49.4 kg)   SpO2 100%   BMI 22.02 kg/m    Physical Exam Vitals reviewed.  Constitutional:      Appearance: Normal appearance.  Musculoskeletal:     Right lower leg: No edema.     Left lower leg: No  edema.     Comments: Pain to palpation of right iliac crest and low back/glute/sacrum, no bruising or swelling to these areas; small healing bruise to right posterior shoulder  Neurological:     Mental Status: She is alert and oriented to person, place, and time.  Psychiatric:        Mood and Affect: Mood normal.        Behavior: Behavior normal.        Thought Content: Thought content normal.        Judgment: Judgment normal.     No results found for any visits on 04/25/24.      Assessment & Plan:   Problem List Items Addressed This Visit   None Visit Diagnoses       Fall, initial encounter    -  Primary   Relevant Orders   DG HIP UNILAT W OR W/O PELVIS 2-3 VIEWS RIGHT   DG Sacrum/Coccyx   For home use only DME 4 wheeled rolling walker with seat ([id number])     Pain of right hip       Relevant Orders   DG HIP UNILAT W OR W/O PELVIS 2-3 VIEWS RIGHT       Assessment & Plan Right hip and pelvis pain Acute  pain post-fall with severe movement pain - Order X-ray today - Recommend heat, ice, rest. - Advise avoiding pain-exacerbating activities. - Okay to try occasional NSAIDs or tylenol  - Prescribe walker for support  - Suggest heating pad and lidocaine  patches        No orders of the defined types were placed in this encounter.   Return if symptoms worsen or fail to improve.  Everlina Hock, NP

## 2024-04-25 NOTE — Telephone Encounter (Signed)
 FYI Only or Action Required?: FYI only for provider  Patient was last seen in primary care on 12/23/2023 by Dorrene Gaucher, NP. Called Nurse Triage reporting Right Hip Pain and Fall. Symptoms began 3 days. Interventions attempted: OTC medications: Tylenol , Rest, hydration, or home remedies, and Ice/heat application. Symptoms are: rapidly worsening.  Triage Disposition: See HCP Within 4 Hours (Or PCP Triage)  Patient/caregiver understands and will follow disposition?: Yes              Appointment made for today 04/25/2024 at 1:40 PM with Minna Amass, NP.             Copied from CRM [id number]. Topic: Clinical - Red Word Triage >> Apr 25, 2024 10:20 AM Clyde Darling P wrote: Red Word that prompted transfer to Nurse Triage: fell in bathroom sunday morning , - in alot pain ( in the hips on right side, ) Reason for Disposition  [1] SEVERE pain AND [2] not improved 2 hours after pain medicine/ice packs  Answer Assessment - Initial Assessment Questions 1. MECHANISM: How did the injury happen? (e.g., twisting injury, direct blow)      Cynthia Young Sunday (3 days ago) in the bathroom--slipped and fell---no loss of consciousness or dizziness---Patient unsure if she hit her head of not--she states she sees a crack in the wall and she isnt sure how that got there---she thinks maybe the brush she was trying to hit a fly with might have hit the wall--denies taking any blood thinners--doesn't feel any pain on her head 2. ONSET: When did the injury happen? (Minutes or hours ago)      3 days ago on Sunday 3. LOCATION: Where is the injury located?      Right hip 4. APPEARANCE of INJURY: What does the injury look like?  (e.g., deformity of leg)     I didn't see any bruising yet 5. SEVERITY: Can you put weight on that leg? Can you walk?      Yes but very painful 6. SIZE: For cuts, bruises, or swelling, ask: How large is it? (e.g., inches or centimeters;  entire joint)       No 7. PAIN: Is there pain? If Yes, ask: How bad is the pain?   What does it keep you from doing? (e.g., Scale 1-10; or mild, moderate, severe)   -  NONE: (0): no pain   -  MILD (1-3): doesn't interfere with normal activities    -  MODERATE (4-7): interferes with normal activities (e.g., work or school) or awakens from sleep, limping    -  SEVERE (8-10): excruciating pain, unable to do any normal activities, unable to walk     9 8. TETANUS: For any breaks in the skin, ask: When was the last tetanus booster?     ----- 9. OTHER SYMPTOMS: Do you have any other symptoms?      -----  Patient uses a cane to walk anyway and has been using it Tried an ice pack. Patient afraid to get up due to sharp pains. Tylenol  helps a little bit She denies any head pain or bleeding anywhere Patient's daughter and a friend who is a nurse came over when patient fell and they didn't think she needed to go to the hospital at that time per patient. Patient's daughter is currently on the way to the patient's home now and will be with her in the next 30 minutes. Patient made an appointment and states that if her daughter feels  like she needs to go to the hospital, they will go there to the Emergency Room. Patient is advised she can call an ambulance if needed. She states that she doesn't want to call an ambulance and she doesn't want to go to the Emergency Room if possible.---She is advised that this pain is recommended to be evaluated as soon as possible. Patient is advised that if anything worsens to go ahead to the Emergency Room for immediate evaluation. Patient verbalized understanding of this.  Protocols used: Hip Injury-A-AH

## 2024-04-30 ENCOUNTER — Ambulatory Visit: Payer: Self-pay

## 2024-04-30 ENCOUNTER — Ambulatory Visit: Admitting: Medical

## 2024-04-30 NOTE — Telephone Encounter (Signed)
 Patient was advised of Edward Saguier,PA-C message and the son agreeed to take the patient to [facility] ER.

## 2024-04-30 NOTE — Telephone Encounter (Signed)
 FYI Only or Action Required?: Action required by provider  Patient was last seen in primary care on 04/25/2024 by Everlina Hock, NP. Called Nurse Triage reporting Hip Pain. Symptoms began yesterday. Interventions attempted: OTC medications: Aleve and Rest, hydration, or home remedies. Symptoms are: unchanged.  Triage Disposition: See HCP Within 4 Hours (Or PCP Triage)  Patient/caregiver understands and will follow disposition?: No, wishes to speak with PCP-son is with patient-he may just take patient to ED to be evaluated.   Copied from CRM [id number]. Topic: Clinical - Red Word Triage >> Apr 30, 2024  9:46 AM Dewanda Foots wrote: Red Word that prompted transfer to Nurse Triage: Pt seems a little disoriented when talking, trying to gather her thoughts.  She had a fall, but could not remember when the fall was, she states she fell in the bathroom but did not break anything. She states she was seen at the Dr but did not go to the ER after the fall.  Pt states she is worried about being home alone as her son that came to stay with her is leaving tomorrow and thinks she should perhaps have gone to rehab for this fall because she is still experiencing a lot of pain that is not getting any better, and is in fact getting worse. Reason for Disposition  [1] SEVERE pain (e.g., excruciating, unable to do any normal activities) AND [2] not improved after 2 hours of pain medicine  Answer Assessment - Initial Assessment Questions 1. LOCATION and RADIATION: Where is the pain located?      Right hip pain 2. QUALITY: What does the pain feel like?  (e.g., sharp, dull, aching, burning)     Patient reports feeling uncomfortable 3. SEVERITY: How bad is the pain? What does it keep you from doing?   (Scale 1-10; or mild, moderate, severe)   -  MILD (1-3): doesn't interfere with normal activities    -  MODERATE (4-7): interferes with normal activities (e.g., work or school) or awakens from sleep, limping    -   SEVERE (8-10): excruciating pain, unable to do any normal activities, unable to walk     8 out of 10 4. ONSET: When did the pain start? Does it come and go, or is it there all the time?     Started last week after falling 5. WORK OR EXERCISE: Has there been any recent work or exercise that involved this part of the body?      no 6. CAUSE: What do you think is causing the hip pain?      unsure 7. AGGRAVATING FACTORS: What makes the hip pain worse? (e.g., walking, climbing stairs, running)     Getting up from chair 8. OTHER SYMPTOMS: Do you have any other symptoms? (e.g., back pain, pain shooting down leg,  fever, rash)     Lower back pain.   Patient has been taking Aleve to help with pain.patient was seen in the office on 04/25/2024. Son reports patient hasn't taken thyroid  medication in three days along with not taking any Aleve yesterday for pain. Patient reports feeling foggy. Patient talked about wanting to go to rehab. Discussed with son and patient the need to be seen in the ED if patient wants to be sent to rehab. Patient is asking for a phone call from PCP. Son states he is going to try and get some breakfast into patient.  Protocols used: Hip Pain-A-AH

## 2024-04-30 NOTE — Telephone Encounter (Signed)
 Reached out to patient and she scheduled appointment with Edward Saguier,PA-C to see what other options she may have.

## 2024-05-09 MED ORDER — TRAMADOL HCL 50 MG PO TABS: 50.0000 mg | ORAL_TABLET | Freq: Three times a day (TID) | ORAL | 0 refills | Status: AC | PRN

## 2024-05-09 NOTE — Addendum Note (Signed)
 Addended by: ALMARIE BIRMINGHAM B on: 05/09/2024 04:41 PM   Modules accepted: Orders

## 2024-05-11 ENCOUNTER — Ambulatory Visit: Payer: Self-pay

## 2024-05-11 NOTE — Telephone Encounter (Signed)
 FYI Only or Action Required?: FYI only for provider.  Patient was last seen in primary care on 04/25/2024 by Almarie Waddell NOVAK, NP. Called Nurse Triage reporting Fall and Back Pain. Symptoms began several weeks ago. Interventions attempted: OTC medications: Tylenol , NSAIDs (stopped due to GI upset), Prescription medications: codeine (tried it once), Ice/heat application, and Other: walker, cane. Symptoms are: back pain mostly on the right side, intermittent leg cramps at night, headaches, nocturia gradually improving.  Triage Disposition: See PCP Within 2 Weeks (overriding See PCP When Office is Open (Within 3 Days))  Patient/caregiver understands and will follow disposition?: Yes                     Copied from CRM [id number]. Topic: Clinical - Red Word Triage >> May 11, 2024  1:00 PM Thersia C wrote: [facility] that prompted transfer to Nurse Triage: Patient called in regarding recently fell, and wanted to know what she needs to do for this, patient also stated that she is in pain, Wanted to see Dr.Blyth but has no openings until her scheduled appointment 08/18 Reason for Disposition  [1] MODERATE back pain (e.g., interferes with normal activities) AND [2] present > 3 days  Answer Assessment - Initial Assessment Questions Patient is requesting correspondence from Dr Domenica. She is requesting a second opinion with Xrays and states she thinks she should've gone to rehab for her fall and pain. She states she has gradually improved.  1. ONSET: When did the pain begin?      X 3 weeks, following her fall.  2. LOCATION: Where does it hurt? (upper, mid or lower back)     Usually the lower back but she states it can go to her entire back. The right side is the worst pain.  3. SEVERITY: How bad is the pain?  (e.g., Scale 1-10; mild, moderate, or severe)   - MILD (1-3): Doesn't interfere with normal activities.    - MODERATE (4-7): Interferes with normal activities or awakens from  sleep.    - SEVERE (8-10): Excruciating pain, unable to do any normal activities.      7/10.  4. PATTERN: Is the pain constant? (e.g., yes, no; constant, intermittent)      Comes and goes. She states at the beginning it was constant, all the time but states she feels better and it is not all day long.  5. RADIATION: Does the pain shoot into your legs or somewhere else?     Not quite  6. CAUSE:  What do you think is causing the back pain?      Fall and back injury 3 weeks ago.  7. BACK OVERUSE:  Any recent lifting of heavy objects, strenuous work or exercise?     She states she lives by herself and she has tried not to overuse her back but she states she may have had to lift things or reach for something for herself.  8. MEDICINES: What have you taken so far for the pain? (e.g., nothing, acetaminophen , NSAIDS)     Tylenol  and NSAIDs, she states she stopped the NSAIDs due to GI upset.  9. NEUROLOGIC SYMPTOMS: Do you have any weakness, numbness, or problems with bowel/bladder control?     No weakness or numbness in legs.   10. OTHER SYMPTOMS: Do you have any other symptoms? (e.g., fever, abdomen pain, burning with urination, blood in urine)       Headaches, intermittent cramps in legs, nocturia, bilateral feet neuropathy.  11. PREGNANCY: Is there any chance you are pregnant? When was your last menstrual period?       N/A.  Protocols used: Back Pain-A-AH

## 2024-05-12 ENCOUNTER — Other Ambulatory Visit: Payer: Self-pay

## 2024-05-12 ENCOUNTER — Encounter (HOSPITAL_BASED_OUTPATIENT_CLINIC_OR_DEPARTMENT_OTHER): Payer: Self-pay | Admitting: Emergency Medicine

## 2024-05-12 ENCOUNTER — Emergency Department (HOSPITAL_BASED_OUTPATIENT_CLINIC_OR_DEPARTMENT_OTHER)

## 2024-05-12 ENCOUNTER — Emergency Department (HOSPITAL_BASED_OUTPATIENT_CLINIC_OR_DEPARTMENT_OTHER)
Admission: EM | Admit: 2024-05-12 | Discharge: 2024-05-12 | Disposition: A | Discharge status: Home / Self Care | Attending: Emergency Medicine | Admitting: Emergency Medicine

## 2024-05-12 DIAGNOSIS — R030 Elevated blood-pressure reading, without diagnosis of hypertension: Secondary | ICD-10-CM | POA: Diagnosis not present

## 2024-05-12 DIAGNOSIS — R072 Precordial pain: Secondary | ICD-10-CM | POA: Insufficient documentation

## 2024-05-12 DIAGNOSIS — R001 Bradycardia, unspecified: Secondary | ICD-10-CM | POA: Insufficient documentation

## 2024-05-12 DIAGNOSIS — I10 Essential (primary) hypertension: Secondary | ICD-10-CM | POA: Diagnosis not present

## 2024-05-12 DIAGNOSIS — I700 Atherosclerosis of aorta: Secondary | ICD-10-CM | POA: Diagnosis not present

## 2024-05-12 DIAGNOSIS — I771 Stricture of artery: Secondary | ICD-10-CM | POA: Diagnosis not present

## 2024-05-12 DIAGNOSIS — E039 Hypothyroidism, unspecified: Secondary | ICD-10-CM | POA: Insufficient documentation

## 2024-05-12 DIAGNOSIS — R079 Chest pain, unspecified: Secondary | ICD-10-CM | POA: Diagnosis not present

## 2024-05-12 LAB — COMPREHENSIVE METABOLIC PANEL WITH GFR
ALT: 10 U/L (ref 0–44)
AST: 20 U/L (ref 15–41)
Albumin: 4.3 g/dL (ref 3.5–5.0)
Alkaline Phosphatase: 102 U/L (ref 38–126)
Anion gap: 13 (ref 5–15)
BUN: 13 mg/dL (ref 8–23)
CO2: 23 mmol/L (ref 22–32)
Calcium: 9.6 mg/dL (ref 8.9–10.3)
Chloride: 97 mmol/L — ABNORMAL LOW (ref 98–111)
Creatinine, Ser: 0.79 mg/dL (ref 0.44–1.00)
GFR, Estimated: >60 mL/min (ref >60)
Glucose, Bld: 99 mg/dL (ref 70–99)
Potassium: 4 mmol/L (ref 3.5–5.1)
Sodium: 132 mmol/L — ABNORMAL LOW (ref 135–145)
Total Bilirubin: 0.3 mg/dL (ref 0.0–1.2)
Total Protein: 6.7 g/dL (ref 6.5–8.1)

## 2024-05-12 LAB — CBC WITH DIFFERENTIAL/PLATELET
Abs Immature Granulocytes: 0.03 10*3/uL (ref 0.00–0.07)
Basophils Absolute: 0.1 10*3/uL (ref 0.0–0.1)
Basophils Relative: 1 %
Eosinophils Absolute: 0.1 10*3/uL (ref 0.0–0.5)
Eosinophils Relative: 1 %
HCT: 38.4 % (ref 36.0–46.0)
Hemoglobin: 12.7 g/dL (ref 12.0–15.0)
Immature Granulocytes: 0 %
Lymphocytes Relative: 25 %
Lymphs Abs: 2 10*3/uL (ref 0.7–4.0)
MCH: 30.3 pg (ref 26.0–34.0)
MCHC: 33.1 g/dL (ref 30.0–36.0)
MCV: 91.6 fL (ref 80.0–100.0)
Monocytes Absolute: 0.7 10*3/uL (ref 0.1–1.0)
Monocytes Relative: 9 %
Neutro Abs: 5.2 10*3/uL (ref 1.7–7.7)
Neutrophils Relative %: 64 %
Platelets: 329 10*3/uL (ref 150–400)
RBC: 4.19 MIL/uL (ref 3.87–5.11)
RDW: 13.3 % (ref 11.5–15.5)
WBC: 8.1 10*3/uL (ref 4.0–10.5)
nRBC: 0 % (ref 0.0–0.2)

## 2024-05-12 LAB — TROPONIN T, HIGH SENSITIVITY
Troponin T High Sensitivity: 35 ng/L — ABNORMAL HIGH (ref <19)
Troponin T High Sensitivity: 34 ng/L — ABNORMAL HIGH (ref <19)

## 2024-05-12 LAB — MAGNESIUM: Magnesium: 2.2 mg/dL (ref 1.7–2.4)

## 2024-05-12 LAB — LIPASE, BLOOD: Lipase: 28 U/L (ref 11–51)

## 2024-05-12 MED ORDER — ACETAMINOPHEN 500 MG PO TABS
1000.0000 mg | ORAL_TABLET | Freq: Once | ORAL | Status: AC
  Administered 2024-05-12: 1000 mg via ORAL
  Filled 2024-05-12 (×2): qty 2

## 2024-05-12 MED ORDER — ALUM & MAG HYDROXIDE-SIMETH 200-200-20 MG/5ML PO SUSP
30.0000 mL | Freq: Once | ORAL | Status: AC
  Administered 2024-05-12: 30 mL via ORAL
  Filled 2024-05-12: qty 30

## 2024-05-12 MED ORDER — FENTANYL CITRATE PF 50 MCG/ML IJ SOSY
25.0000 ug | PREFILLED_SYRINGE | Freq: Once | INTRAMUSCULAR | Status: DC
  Filled 2024-05-12: qty 1

## 2024-05-12 NOTE — ED Triage Notes (Addendum)
 Pt c/o mid chest pain since last night as well as middle back pain; sts she had a fell in the bathtub a few weeks ago and has had back pain ever since; denies other sxs; bradycardia noted, family and pt report it is always that low

## 2024-05-12 NOTE — ED Provider Notes (Signed)
 Emergency Department Provider Note   I have reviewed the triage vital signs and the nursing notes.   HISTORY  Chief Complaint Chest Pain   HPI Cynthia Young is a 89 y.o. female with PMH of HLD and heart block with baseline bradycardia who presents to the emergency department for evaluation of chest pain.  Pain began last night and has been fairly constant.  She had a fall several weeks ago which gave her back pain and she thought maybe the pain was related to that fall but had not experienced chest pain until last night.  She took some Pepto-Bismol along with a muscle relaxer with no improvement.  She was finally able to get some sleep but then continued to have pain this morning.  No syncope. With continued pain throughout the day she presents today with family at bedside.    Past Medical History:  Diagnosis Date   Arthritis    Hyperglycemia 09/04/2020   Hyperlipidemia    Hypothyroid     Review of Systems  Constitutional: No fever/chills Cardiovascular: Positive chest pain. Respiratory: Denies shortness of breath. Gastrointestinal: No abdominal pain.  No nausea, no vomiting.  Neurological: Negative for headaches, focal weakness or numbness.   ____________________________________________   PHYSICAL EXAM:  VITAL SIGNS: ED Triage Vitals  Encounter Vitals Group     BP 05/12/24 1552 (!) 200/59     Pulse Rate 05/12/24 1552 (!) 35     Resp 05/12/24 1552 (!) 24     Temp 05/12/24 1552 99.1 F (37.3 C)     Temp src --      SpO2 05/12/24 1552 98 %     Weight 05/12/24 1548 108 lb 15.9 oz (49.4 kg)     Height 05/12/24 1548 4' 11 (1.499 m)   Constitutional: Alert and oriented. Well appearing and in no acute distress. Eyes: Conjunctivae are normal.  Head: Atraumatic. Nose: No congestion/rhinnorhea. Mouth/Throat: Mucous membranes are moist.   Neck: No stridor.   Cardiovascular: Bradycardia. Good peripheral circulation. Grossly normal heart sounds.   Respiratory: Normal  respiratory effort.  No retractions. Lungs CTAB. Gastrointestinal: Soft and nontender. No distention.  Musculoskeletal: No gross deformities of extremities. Neurologic:  Normal speech and language.  Skin:  Skin is warm, dry and intact. No rash noted.   ____________________________________________   LABS (all labs ordered are listed, but only abnormal results are displayed)  Labs Reviewed  COMPREHENSIVE METABOLIC PANEL WITH GFR - Abnormal; Notable for the following components:      Result Value   Sodium 132 (*)    Chloride 97 (*)    All other components within normal limits  TROPONIN T, HIGH SENSITIVITY - Abnormal; Notable for the following components:   Troponin T High Sensitivity 35 (*)    All other components within normal limits  TROPONIN T, HIGH SENSITIVITY - Abnormal; Notable for the following components:   Troponin T High Sensitivity 34 (*)    All other components within normal limits  LIPASE, BLOOD  CBC WITH DIFFERENTIAL/PLATELET  MAGNESIUM    ____________________________________________  EKG   EKG Interpretation Date/Time:  Saturday May 12 2024 15:47:22 EDT Ventricular Rate:  36 PR Interval:  198 QRS Duration:  115 QT Interval:  471 QTC Calculation: 365 R Axis:   -66  Text Interpretation: Predominant 2:1 AV block Incomplete left bundle branch block Left ventricular hypertrophy Anterior Q waves, possibly due to LVH Similar to prior Confirmed by Darra Chew [phone]) on 05/12/2024 3:56:03 PM  ____________________________________________  RADIOLOGY  DG Chest 2 View Result Date: 05/12/2024 CLINICAL DATA:  chest pain EXAM: CHEST - 2 VIEW COMPARISON:  December 23, 2023 FINDINGS: No focal airspace consolidation, pleural effusion, or pneumothorax. No cardiomegaly. Tortuous aorta with aortic atherosclerosis. No acute fracture or destructive lesions. Multilevel thoracic osteophytosis. Mild chronic compression fracture in the lower thoracic spine. Osteopenia.  IMPRESSION: No acute cardiopulmonary abnormality. Electronically Signed   By: Rogelia Myers M.D.   On: 05/12/2024 17:23    ____________________________________________   PROCEDURES  Procedure(s) performed:   Procedures  None  ____________________________________________   INITIAL IMPRESSION / ASSESSMENT AND PLAN / ED COURSE  Pertinent labs & imaging results that were available during my care of the patient were reviewed by me and considered in my medical decision making (see chart for details).   This patient is Presenting for Evaluation of CP, which does require a range of treatment options, and is a complaint that involves a high risk of morbidity and mortality.  The Differential Diagnoses includes but is not exclusive to acute coronary syndrome, aortic dissection, pulmonary embolism, cardiac tamponade, community-acquired pneumonia, pericarditis, musculoskeletal chest wall pain, etc.   Critical Interventions-    Medications  fentaNYL  (SUBLIMAZE ) injection 25 mcg (25 mcg Intravenous Patient Refused/Not Given 05/12/24 1854)  alum & mag hydroxide-simeth (MAALOX/MYLANTA) 200-200-20 MG/5ML suspension 30 mL (30 mLs Oral Given 05/12/24 1646)  acetaminophen  (TYLENOL ) tablet 1,000 mg (1,000 mg Oral Given 05/12/24 1837)    Reassessment after intervention: pain slightly improved.    I did obtain Additional Historical Information from daughter at bedside.   I decided to review pertinent External Data, and in summary no prior ACS history. Follows with Dr. Inocencio for 2-1 AV block.    Clinical Laboratory Tests Ordered, included Troponin flat at 35 --> 34. No AKI. Mg and Lipase negative.   Radiologic Tests Ordered, included CXR. I independently interpreted the images and agree with radiology interpretation.   Cardiac Monitor Tracing which shows bradycardia.    Social Determinants of Health Risk patient is a non-smoker.   Medical Decision Making: Summary:  The patient presents  emergency department with chest pain which been constant since last night.  Bradycardia is baseline for her with known history of AV block.  No acute ischemic change on initial EKG.  Plan for troponins and reassess.  Reevaluation with update and discussion with patient and family.  We had a Tuyen Uncapher discussion about my desire to admit her for observation and troponin trending.  After a discussion she and family would like to return home.  Patient states she is not sure she would even want intervention if she was having a heart attack.  We discussed that I do not think she is having a heart attack actively but I also do not have a clear cause for her pain.  It very well could be musculoskeletal or GI related.  She understands the uncertainty at this point and would prefer to go home and return if symptoms worsen.  She does have a follow-up appointment with her PCP next week.  Considered admission but after shared decision-making conversation patient prefers to return home.  Patient's presentation is most consistent with acute presentation with potential threat to life or bodily function.   Disposition: discharge  ____________________________________________  FINAL CLINICAL IMPRESSION(S) / ED DIAGNOSES  Final diagnoses:  Precordial chest pain  Elevated blood pressure reading    Note:  This document was prepared using Dragon voice recognition software and may include unintentional dictation errors.  Fonda Law, MD, [facility]    Spurgeon Gancarz, Fonda MATSU, MD 05/12/24 [phone]

## 2024-05-12 NOTE — Discharge Instructions (Signed)
 You were seen in the emerged from today with chest discomfort.  Your heart enzymes were stable.  We discussed observation in the hospital/admission but have ultimately decided to send you home.  If you have worsening pain I would like for you to immediately return to the emergency department or call 911.  Please take your home medications and follow closely with your primary care doctor as well as your cardiology team.

## 2024-05-13 NOTE — Progress Notes (Unsigned)
 [facility] [address], [address] [place], [place] [address] [phone]  Date:  05/16/2024   Name:  Cynthia Young   DOB:  02-02-1930   MRN:  [id number]  PCP:  Domenica Harlene LABOR, MD    Chief Complaint: No chief complaint on file.   History of Present Illness:  Cynthia Young is a 89 y.o. very pleasant female patient who presents with the following:  Primary patient of my partner Dr. Domenica seen today with concern of back pain after recent fall History of hypothyroidism, Mobitz 2 heart block, osteoporosis, mild cognitive impairment She saw another my partners, Waddell Mon NP on 6/11 after she fell and was experiencing right hip pain X-rays negative for fracture, Waddell gave her some tramadol  to use as needed  She was in the ER more recently on 6/28 with chest pain-she was evaluated and released to home Patient Active Problem List   Diagnosis Date Noted   Bronchitis 12/24/2023   Bradycardia 12/24/2023   Mobitz type 2 second degree atrioventricular block 10/21/2023   Hypothyroidism 10/21/2023   Urinary frequency 05/02/2023   Osteoporosis 07/13/2022   Chest pain 07/13/2022   Symptomatic bradycardia 01/14/2021   Mild cognitive impairment 01/14/2021   Pyrosis 09/20/2020   Gastric ulcer without hemorrhage or perforation 09/20/2020   History of choledocholithiasis 09/20/2020   Abnormal CT scan, sigmoid colon 09/20/2020   History of ERCP 09/20/2020   Hyperkalemia 09/07/2020   Hyperglycemia 09/04/2020   Hypocalcemia 09/04/2020   Acute cholecystitis 04/09/2020   Varicose veins of both lower extremities 08/03/2019   Hiatal hernia with GERD 03/16/2019   Myalgia 09/25/2018   Preventative health care 03/30/2018   Hyperlipidemia     Past Medical History:  Diagnosis Date   Arthritis    Hyperglycemia 09/04/2020   Hyperlipidemia    Hypothyroid     Past Surgical History:  Procedure Laterality Date   BACK SURGERY     BIOPSY   04/12/2020   Procedure: BIOPSY;  Surgeon: Wilhelmenia Aloha Raddle., MD;  Location: [facility];  Service: Gastroenterology;;   ROMAYNE Left    CHOLECYSTECTOMY N/A 04/11/2020   Procedure: LAPAROSCOPIC CHOLECYSTECTOMY WITH INTRAOPERATIVE CHOLANGIOGRAM;  Surgeon: Ethyl Lenis, MD;  Location: [facility];  Service: General;  Laterality: N/A;   ERCP N/A 04/12/2020   Procedure: ENDOSCOPIC RETROGRADE CHOLANGIOPANCREATOGRAPHY (ERCP);  Surgeon: Wilhelmenia Aloha Raddle., MD;  Location: [facility];  Service: Gastroenterology;  Laterality: N/A;   FOOT SURGERY Right    bunion   REMOVAL OF STONES  04/12/2020   Procedure: REMOVAL OF STONES;  Surgeon: Wilhelmenia Aloha Raddle., MD;  Location: [facility];  Service: Gastroenterology;;   ANNETT  04/12/2020   Procedure: ANNETT;  Surgeon: Mansouraty, Aloha Raddle., MD;  Location: [facility];  Service: Gastroenterology;;    Social History   Tobacco Use   Smoking status: Never   Smokeless tobacco: Never  Vaping Use   Vaping status: Never Used  Substance Use Topics   Alcohol use: Yes    Comment: socially   Drug use: Never    Family History  Problem Relation Age of Onset   Cancer Mother    Heart disease Father    Arthritis Father        rheumatoid   Cancer Brother        colon   Colon cancer Brother    Esophageal cancer Neg Hx    Inflammatory bowel disease Neg Hx    Liver disease Neg Hx  Pancreatic cancer Neg Hx    Stomach cancer Neg Hx     Allergies  Allergen Reactions   Tylenol  [Acetaminophen ]     Generalized tremor    Medication list has been reviewed and updated.  Current Outpatient Medications on File Prior to Visit  Medication Sig Dispense Refill   famotidine  (PEPCID ) 20 MG tablet Take 1 tablet (20 mg total) by mouth daily. (Patient taking differently: Take 20 mg by mouth as needed for heartburn or indigestion.) 30 tablet 5   levothyroxine  (SYNTHROID ) 50 MCG tablet TAKE ONE TABLET BY MOUTH ONE TIME DAILY 90 tablet 1    Magnesium  250 MG TABS Take 1 tablet (250 mg total) by mouth 2 (two) times daily.     traMADol  (ULTRAM ) 50 MG tablet Take 1 tablet (50 mg total) by mouth every 8 (eight) hours as needed for up to 5 days. 10 tablet 0   No current facility-administered medications on file prior to visit.    Review of Systems:  As per HPI- otherwise negative.   Physical Examination: There were no vitals filed for this visit. There were no vitals filed for this visit. There is no height or weight on file to calculate BMI. Ideal Body Weight:    GEN: no acute distress. HEENT: Atraumatic, Normocephalic.  Ears and Nose: No external deformity. CV: RRR, No M/G/R. No JVD. No thrill. No extra heart sounds. PULM: CTA B, no wheezes, crackles, rhonchi. No retractions. No resp. distress. No accessory muscle use. ABD: S, NT, ND, +BS. No rebound. No HSM. EXTR: No c/c/e PSYCH: Normally interactive. Conversant.    Assessment and Plan: ***  Signed Harlene Schroeder, MD

## 2024-05-16 ENCOUNTER — Ambulatory Visit (INDEPENDENT_AMBULATORY_CARE_PROVIDER_SITE_OTHER): Admitting: Family Medicine

## 2024-05-16 ENCOUNTER — Inpatient Hospital Stay (HOSPITAL_COMMUNITY)
Admission: EM | Admit: 2024-05-16 | Discharge: 2024-05-18 | DRG: 244 | Disposition: A | Discharge status: Home / Self Care | Source: Ambulatory Visit | Attending: Cardiology | Admitting: Cardiology

## 2024-05-16 ENCOUNTER — Encounter: Payer: Self-pay | Admitting: Family Medicine

## 2024-05-16 ENCOUNTER — Encounter (HOSPITAL_COMMUNITY): Payer: Self-pay

## 2024-05-16 ENCOUNTER — Other Ambulatory Visit: Payer: Self-pay

## 2024-05-16 VITALS — BP 98/62 | HR 31 | Ht 59.0 in | Wt 106.2 lb

## 2024-05-16 DIAGNOSIS — G629 Polyneuropathy, unspecified: Secondary | ICD-10-CM | POA: Diagnosis present

## 2024-05-16 DIAGNOSIS — Z8249 Family history of ischemic heart disease and other diseases of the circulatory system: Secondary | ICD-10-CM | POA: Diagnosis not present

## 2024-05-16 DIAGNOSIS — G8929 Other chronic pain: Secondary | ICD-10-CM | POA: Diagnosis not present

## 2024-05-16 DIAGNOSIS — Z800 Family history of malignant neoplasm of digestive organs: Secondary | ICD-10-CM

## 2024-05-16 DIAGNOSIS — R001 Bradycardia, unspecified: Secondary | ICD-10-CM

## 2024-05-16 DIAGNOSIS — I441 Atrioventricular block, second degree: Principal | ICD-10-CM | POA: Diagnosis present

## 2024-05-16 DIAGNOSIS — Z79890 Hormone replacement therapy (postmenopausal): Secondary | ICD-10-CM

## 2024-05-16 DIAGNOSIS — Z886 Allergy status to analgesic agent status: Secondary | ICD-10-CM

## 2024-05-16 DIAGNOSIS — E785 Hyperlipidemia, unspecified: Secondary | ICD-10-CM | POA: Diagnosis not present

## 2024-05-16 DIAGNOSIS — I442 Atrioventricular block, complete: Secondary | ICD-10-CM | POA: Diagnosis not present

## 2024-05-16 DIAGNOSIS — I872 Venous insufficiency (chronic) (peripheral): Secondary | ICD-10-CM | POA: Diagnosis present

## 2024-05-16 DIAGNOSIS — E039 Hypothyroidism, unspecified: Secondary | ICD-10-CM | POA: Diagnosis present

## 2024-05-16 DIAGNOSIS — K219 Gastro-esophageal reflux disease without esophagitis: Secondary | ICD-10-CM | POA: Diagnosis not present

## 2024-05-16 DIAGNOSIS — Z950 Presence of cardiac pacemaker: Secondary | ICD-10-CM | POA: Diagnosis not present

## 2024-05-16 DIAGNOSIS — I4430 Unspecified atrioventricular block: Principal | ICD-10-CM

## 2024-05-16 DIAGNOSIS — R5383 Other fatigue: Secondary | ICD-10-CM | POA: Diagnosis not present

## 2024-05-16 LAB — COMPREHENSIVE METABOLIC PANEL WITH GFR
ALT: 10 U/L (ref 0–44)
AST: 20 U/L (ref 15–41)
Albumin: 3.6 g/dL (ref 3.5–5.0)
Alkaline Phosphatase: 67 U/L (ref 38–126)
Anion gap: 10 (ref 5–15)
BUN: 11 mg/dL (ref 8–23)
CO2: 23 mmol/L (ref 22–32)
Calcium: 9.2 mg/dL (ref 8.9–10.3)
Chloride: 99 mmol/L (ref 98–111)
Creatinine, Ser: 0.81 mg/dL (ref 0.44–1.00)
GFR, Estimated: >60 mL/min (ref >60)
Glucose, Bld: 88 mg/dL (ref 70–99)
Potassium: 4.2 mmol/L (ref 3.5–5.1)
Sodium: 132 mmol/L — ABNORMAL LOW (ref 135–145)
Total Bilirubin: 0.5 mg/dL (ref 0.0–1.2)
Total Protein: 6.2 g/dL — ABNORMAL LOW (ref 6.5–8.1)

## 2024-05-16 LAB — CBC WITH DIFFERENTIAL/PLATELET
Abs Immature Granulocytes: 0.02 10*3/uL (ref 0.00–0.07)
Basophils Absolute: 0.1 10*3/uL (ref 0.0–0.1)
Basophils Relative: 1 %
Eosinophils Absolute: 0.1 10*3/uL (ref 0.0–0.5)
Eosinophils Relative: 2 %
HCT: 37.7 % (ref 36.0–46.0)
Hemoglobin: 12.2 g/dL (ref 12.0–15.0)
Immature Granulocytes: 0 %
Lymphocytes Relative: 22 %
Lymphs Abs: 2 10*3/uL (ref 0.7–4.0)
MCH: 30.7 pg (ref 26.0–34.0)
MCHC: 32.4 g/dL (ref 30.0–36.0)
MCV: 94.7 fL (ref 80.0–100.0)
Monocytes Absolute: 0.7 10*3/uL (ref 0.1–1.0)
Monocytes Relative: 7 %
Neutro Abs: 6.1 10*3/uL (ref 1.7–7.7)
Neutrophils Relative %: 68 %
Platelets: 289 10*3/uL (ref 150–400)
RBC: 3.98 MIL/uL (ref 3.87–5.11)
RDW: 13.3 % (ref 11.5–15.5)
WBC: 8.9 10*3/uL (ref 4.0–10.5)
nRBC: 0 % (ref 0.0–0.2)

## 2024-05-16 LAB — TSH: TSH: 1.977 u[IU]/mL (ref 0.350–4.500)

## 2024-05-16 LAB — MAGNESIUM: Magnesium: 2.1 mg/dL (ref 1.7–2.4)

## 2024-05-16 LAB — T4, FREE: Free T4: 1.02 ng/dL (ref 0.61–1.12)

## 2024-05-16 MED ORDER — LEVOTHYROXINE SODIUM 50 MCG PO TABS
50.0000 ug | ORAL_TABLET | Freq: Every day | ORAL | Status: DC
  Administered 2024-05-17 – 2024-05-18 (×2): 50 ug via ORAL
  Filled 2024-05-16 (×2): qty 1

## 2024-05-16 MED ORDER — SODIUM CHLORIDE 0.9% FLUSH
3.0000 mL | Freq: Two times a day (BID) | INTRAVENOUS | Status: DC
  Administered 2024-05-16 – 2024-05-18 (×4): 3 mL via INTRAVENOUS

## 2024-05-16 MED ORDER — FAMOTIDINE 20 MG PO TABS
20.0000 mg | ORAL_TABLET | Freq: Every day | ORAL | Status: DC
  Administered 2024-05-16 – 2024-05-18 (×3): 20 mg via ORAL
  Filled 2024-05-16 (×3): qty 1

## 2024-05-16 MED ORDER — NITROGLYCERIN 0.4 MG SL SUBL: 0.4000 mg | SUBLINGUAL_TABLET | SUBLINGUAL | Status: DC | PRN

## 2024-05-16 NOTE — ED Notes (Signed)
 Patient ambulatory to restroom  ?

## 2024-05-16 NOTE — ED Triage Notes (Signed)
 Pt sent by PCP for further evaluation of bradycardia in the 30s. Pt refused a pacemaker in the past but is somewhat agreeable to the procedure now. Pt anxious.

## 2024-05-16 NOTE — ED Notes (Signed)
 Patient eating meal, states no needs at this time, family at bedside.

## 2024-05-16 NOTE — ED Provider Notes (Signed)
 [place] EMERGENCY DEPARTMENT AT [facility] Provider Note   CSN: [id number] Arrival date & time: 05/16/24  1200     Patient presents with: Bradycardia   Cynthia Young is a 89 y.o. female.   89 yo F with a chief complaints of feeling a little bit dizzy and lightheaded.  She had event where she fell about a week or so ago.  Follow-up with your family doctor in the office and was found to be in complex AV block.  She is encouraged to come here for evaluation for possible pacemaker placement.  This is not a new finding for her she was actually seen in the ED previous to this and declined pacemaker placement.  She does feel now willing to have it done.        Prior to Admission medications   Medication Sig Start Date End Date Taking? Authorizing Provider  famotidine  (PEPCID ) 20 MG tablet Take 1 tablet (20 mg total) by mouth daily. Patient taking differently: Take 20 mg by mouth as needed for heartburn or indigestion. 07/27/22   Kerman Vina HERO, NP  levothyroxine  (SYNTHROID ) 50 MCG tablet TAKE ONE TABLET BY MOUTH ONE TIME DAILY 04/06/24   Domenica Harlene LABOR, MD  Magnesium  250 MG TABS Take 1 tablet (250 mg total) by mouth 2 (two) times daily. 06/09/23   Domenica Harlene LABOR, MD    Allergies: Tylenol  [acetaminophen ]    Review of Systems  Updated Vital Signs BP (!) 156/67   Pulse (!) 44   Temp 98.7 F (37.1 C) (Oral)   Resp 15   SpO2 100%   Physical Exam Vitals and nursing note reviewed.  Constitutional:      General: She is not in acute distress.    Appearance: She is well-developed. She is not diaphoretic.  HENT:     Head: Normocephalic and atraumatic.  Eyes:     Pupils: Pupils are equal, round, and reactive to light.  Cardiovascular:     Rate and Rhythm: Regular rhythm. Bradycardia present.     Heart sounds: No murmur heard.    No friction rub. No gallop.  Pulmonary:     Effort: Pulmonary effort is normal.     Breath sounds: No wheezing or rales.  Abdominal:      General: There is no distension.     Palpations: Abdomen is soft.     Tenderness: There is no abdominal tenderness.  Musculoskeletal:        General: No tenderness.     Cervical back: Normal range of motion and neck supple.  Skin:    General: Skin is warm and dry.  Neurological:     Mental Status: She is alert and oriented to person, place, and time.  Psychiatric:        Behavior: Behavior normal.     (all labs ordered are listed, but only abnormal results are displayed) Labs Reviewed  COMPREHENSIVE METABOLIC PANEL WITH GFR - Abnormal; Notable for the following components:      Result Value   Sodium 132 (*)    Total Protein 6.2 (*)    All other components within normal limits  CBC WITH DIFFERENTIAL/PLATELET  MAGNESIUM   TSH  T4, FREE    EKG: EKG Interpretation Date/Time:  Wednesday May 16 2024 12:17:19 EDT Ventricular Rate:  29 PR Interval:  200 QRS Duration:  111 QT Interval:  490 QTC Calculation: 340 R Axis:   -62  Text Interpretation: Predominant 2:1 AV block Left anterior fascicular block Left  ventricular hypertrophy Anterior Q waves, possibly due to LVH No significant change since last tracing Confirmed by Emil Share [phone]) on 05/16/2024 12:21:43 PM  Radiology: No results found.   .Critical Care  Performed by: Emil Share, DO Authorized by: Emil Share, DO   Critical care provider statement:    Critical care time (minutes):  35   Critical care time was exclusive of:  Separately billable procedures and treating other patients   Critical care was time spent personally by me on the following activities:  Development of treatment plan with patient or surrogate, discussions with consultants, evaluation of patient's response to treatment, examination of patient, ordering and review of laboratory studies, ordering and review of radiographic studies, ordering and performing treatments and interventions, pulse oximetry, re-evaluation of patient's condition and review of  old charts   Care discussed with: admitting provider      Medications Ordered in the ED - No data to display                                  Medical Decision Making Amount and/or Complexity of Data Reviewed Labs: ordered.   89 yo F with a cc of feeling weak and lightheaded.    Patient actually suffered from a fall recently.  Had seen their PCP in follow-up and noted that she was in a high grade AV block.  She then decided to come to the ED for possible pacemaker placement.  Thyroid  studies here are normal.  No significant electrolyte abnormality.  Mag level normal.  No acute anemia.  Cardiology at bedside to evaluate for possible pacemaker placement.  The patients results and plan were reviewed and discussed.   Any x-rays performed were independently reviewed by myself.   Differential diagnosis were considered with the presenting HPI.  Medications - No data to display  Vitals:   05/16/24 1213  BP: (!) 156/67  Pulse: (!) 44  Resp: 15  Temp: 98.7 F (37.1 C)  TempSrc: Oral  SpO2: 100%    Final diagnoses:  AV block    Admission/ observation were discussed with the admitting physician, patient and/or family and they are comfortable with the plan.       Final diagnoses:  AV block    ED Discharge Orders     None          Emil Share, DO 05/16/24 1434

## 2024-05-16 NOTE — H&P (Addendum)
 Cardiology Admission History and Physical   Patient ID: Cynthia Young MRN: [id number]; DOB: February 22, 1930   Admission date: 05/16/2024  PCP:  Cynthia Harlene LABOR, MD   [facility] Providers Cardiologist:  Cynthia ONEIDA Decent, MD  Electrophysiologist:  Cynthia Gladis Norton, MD   Chief Complaint:  bradycardia  Patient Profile: Cynthia Young is a 89 y.o. 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  She saw Dr. Norton most recently 02/22/24, she was in 2:1 AVBlock rate 30's and he suspected that at this point she may benefit from pacing but she wanted to think about it/discuss with family   who is being seen 05/16/2024 for the evaluation of ongoing bradycardia.  History of Present Illness: Cynthia Young went to see her PMD today for acute/chronic back pain after a fall some weeks prior She was found again to be bradycardic, and reported feeling poorly, weak, headaches, no energy, and reported that she would consider PPM now. Offered/advised EMS to the ER though she preferred to go home 1st and come in once ready  She did have an ER visit 05/12/24 for c/o CP, sounded atypical was in 2:1 AVblock and offered/advised admission, but declined HS Trops mildly abnormal and flat, otherwise labs were unremarkable  05/12/24 K+ 4.0 Mag 2.2 BUN/Creat 13/0.79 HS Trop 35 > 34 WBC 8.1 H/H 12/38 Plts 329  11/11/23: TSH 3.046  Today's labs are pending  The patient is accompanied by her daughter She denies her recent fall as syncope, says she was swatting at a fly and lost her balance. She has had a  couple falls all with an explanation She Naveen Clardy tell her daughter she feels dizzy/bad, this she blames on low BPs, Cailie Bosshart eat a good meal and feels better the next day She for a year or so, has had a constant low grade headache, denies lightheaded Denies syncope She has had c/o CP over the last year or two can be central, left, epigastric, reports a hiatal hernia and gets abdominal bloating that seems to accompany her CPs and has long suspected it to be GI Denies exertional CP No palpitations Denies SOB, DOE  She continues to live independently She reports back pain, neuropathy pains, GI issues, bloating, has to pee all of the time and comments that a pacemaker is not going to make any of that any better, so not entirely convinced still that she should proceed with PPM, epsically at her age  Past Medical History:  Diagnosis Date   Arthritis    Hyperglycemia 09/04/2020   Hyperlipidemia    Hypothyroid    Past Surgical History:  Procedure Laterality Date   BACK SURGERY     BIOPSY  04/12/2020   Procedure: BIOPSY;  Surgeon: Wilhelmenia Aloha Raddle., MD;  Location: [facility];  Service: Gastroenterology;;   ROMAYNE Left    CHOLECYSTECTOMY N/A 04/11/2020   Procedure: LAPAROSCOPIC CHOLECYSTECTOMY WITH INTRAOPERATIVE CHOLANGIOGRAM;  Surgeon: Ethyl Lenis, MD;  Location: [facility];  Service: General;  Laterality: N/A;   ERCP N/A 04/12/2020   Procedure: ENDOSCOPIC RETROGRADE CHOLANGIOPANCREATOGRAPHY (ERCP);  Surgeon: Wilhelmenia Aloha Raddle., MD;  Location:  [facility];  Service: Gastroenterology;  Laterality: N/A;   FOOT SURGERY Right    bunion   REMOVAL OF STONES  04/12/2020   Procedure: REMOVAL OF STONES;  Surgeon: Wilhelmenia Aloha Raddle., MD;  Location: [facility];  Service: Gastroenterology;;   ANNETT  04/12/2020   Procedure: ANNETT;  Surgeon: Mansouraty, Aloha Raddle., MD;  Location: [facility];  Service: Gastroenterology;;     Medications Prior to Admission: Prior to  Admission medications   Medication Sig Start Date End Date Taking? Authorizing Provider  famotidine  (PEPCID ) 20 MG tablet Take 1 tablet (20 mg total) by mouth daily. Patient taking differently: Take 20 mg by mouth as needed for heartburn or indigestion. 07/27/22   Kerman Vina HERO, NP  levothyroxine  (SYNTHROID ) 50 MCG tablet TAKE ONE TABLET BY MOUTH ONE TIME DAILY 04/06/24   Cynthia Harlene LABOR, MD  Magnesium  250 MG TABS Take 1 tablet (250 mg total) by mouth 2 (two) times daily. 06/09/23   Cynthia Harlene LABOR, MD     Allergies:    Allergies  Allergen Reactions   Tylenol  [Acetaminophen ]     Generalized tremor    Social History:   Social History   Socioeconomic History   Marital status: Widowed    Spouse name: Not on file   Number of children: Not on file   Years of education: Not on file   Highest education level: Not on file  Occupational History   Not on file  Tobacco Use   Smoking status: Never   Smokeless tobacco: Never  Vaping Use   Vaping status: Never Used  Substance and Sexual Activity   Alcohol use: Yes    Comment: socially   Drug use: Never   Sexual activity: Not Currently  Other Topics Concern   Not on file  Social History Narrative   worked in [employer] office, short time. No cigarettes or drug use. Live by self no dietary restictions and walks daily   Social Drivers of Health   Financial Resource Strain: Low Risk  (09/23/2023)   Overall Financial Resource Strain (CARDIA)    Difficulty of Paying Living Expenses: Not hard at all  Food Insecurity: No Food Insecurity (10/25/2023)   Hunger Vital Sign    Worried About Running Out of Food in the Last Year: Never true    Ran Out of Food in the Last Year: Never true  Transportation Needs: No Transportation Needs (10/25/2023)   PRAPARE - Administrator, Civil Service (Medical): No    Lack of Transportation (Non-Medical): No  Physical Activity: Inactive (09/23/2023)   Exercise Vital Sign    Days of Exercise per  Week: 0 days    Minutes of Exercise per Session: 0 min  Stress: No Stress Concern Present (09/23/2023)   [employer] of Occupational Health - Occupational Stress Questionnaire    Feeling of Stress : Not at all  Social Connections: Socially Isolated (09/23/2023)   Social Connection and Isolation Panel    Frequency of Communication with Friends and Family: Once a week    Frequency of Social Gatherings with Friends and Family: Once a week    Attends Religious Services: Never    Database administrator or Organizations: No    Attends Banker Meetings: Never    Marital Status: Widowed  Intimate Partner Violence: Not At Risk (10/25/2023)   Humiliation, Afraid, Rape, and Kick questionnaire    Fear of Current or Ex-Partner: No    Emotionally Abused: No  Physically Abused: No    Sexually Abused: No     Family History:   The patient's family history includes Arthritis in her father; Cancer in her brother and mother; Colon cancer in her brother; Heart disease in her father. There is no history of Esophageal cancer, Inflammatory bowel disease, Liver disease, Pancreatic cancer, or Stomach cancer.    ROS:  Please see the history of present illness.  All other ROS reviewed and negative.     Physical Exam/Data: There were no vitals filed for this visit. No intake or output data in the 24 hours ending 05/16/24 1211    05/16/2024    9:05 AM 05/12/2024    3:48 PM 04/25/2024    1:19 PM  Last 3 Weights  Weight (lbs) 106 lb 3.2 oz 108 lb 15.9 oz 109 lb  Weight (kg) 48.172 kg 49.44 kg 49.442 kg     There is no height or weight on file to calculate BMI.  General:  Well nourished, well developed, in no acute distress HEENT: normal Neck: no JVD Vascular: No carotid bruits; Distal pulses 2+ bilaterally   Cardiac:  RRR, 2/6 SM/DM Lungs:  CTA b/l, no wheezing, rhonchi or rales  Abd: soft, nontender, no hepatomegaly  Ext: no edema Musculoskeletal:  No deformities, age appropriate  atrophy, quite thin body habitus Skin: warm and dry  Neuro:  no gross focal abnormalities noted Psych:  Normal affect   EKG:  The ECG that was done today was personally reviewed and demonstrates   #1 is 2:1 AV block 29bpm, narrow QRS 107bpm #2 is 2:1 AVBlock 29bpm, LAD , QRS  OLD 05/12/24 is 2:1 AVBlock 36bpm, LAD, QRS 01/14/2021 is 2:1 AVblock 33bpm, LAD, QRS  Relevant CV Studies:   10/24/2023: TTE  1. Left ventricular ejection fraction, by estimation, is 60 to 65%. The  left ventricle has normal function. The left ventricle has no regional  wall motion abnormalities. Left ventricular diastolic parameters were  normal.   2. Right ventricular systolic function is normal. The right ventricular  size is normal.   3. The mitral valve is abnormal. Mild mitral valve regurgitation. No  evidence of mitral stenosis.   4. The aortic valve is tricuspid. There is moderate calcification of the  aortic valve. There is moderate thickening of the aortic valve. Aortic  valve regurgitation is trivial. Mild aortic valve stenosis.   5. The inferior vena cava is normal in size with greater than 50%  respiratory variability, suggesting right atrial pressure of 3 mmHg.    Laboratory Data: High Sensitivity Troponin:  No results for input(s): TROPONINIHS in the last 720 hours.    Chemistry Recent Labs  Lab 05/12/24 1630  NA 132*  K 4.0  CL 97*  CO2 23  GLUCOSE 99  BUN 13  CREATININE 0.79  CALCIUM 9.6  MG 2.2  GFRNONAA >60  ANIONGAP 13    Recent Labs  Lab 05/12/24 1630  PROT 6.7  ALBUMIN 4.3  AST 20  ALT 10  ALKPHOS 102  BILITOT 0.3   Lipids No results for input(s): CHOL, TRIG, HDL, LABVLDL, LDLCALC, CHOLHDL in the last 168 hours. Hematology Recent Labs  Lab 05/12/24 1630  WBC 8.1  RBC 4.19  HGB 12.7  HCT 38.4  MCV 91.6  MCH 30.3  MCHC 33.1  RDW 13.3  PLT 329   Thyroid  No results for input(s): TSH, FREET4 in the last 168 hours. BNPNo  results for input(s): BNP, PROBNP in the last  168 hours.  DDimer No results for input(s): DDIMER in the last 168 hours.  Radiology/Studies:  No results found.   Assessment and Plan: Longstanding conduction system disease LAFB, Mobitz one and two AVBlock with 2:1 conduction Narrow QRS Preserved LVEF  Denies near syncope or syncope Her daughter reports often tells her she feels dizzy. She cites a number of reasons she would not want a pacer > Having to wait to eat,  having to lay in the bed waiting,  vanity,  age,  and mostly it is not going to fix all of her problems, she Sonji Starkes still be plagued with her aches/pains, other issues.  We discussed that while all of that is true, I though that likely she would somewhat better having HRs 60's or rather then 29-30's Suspect that she is more symptomatic then she thinks Her BP is stable currently and feels well,   We talked about the implant procedure, potential risks/benefits, She Khloie Hamada discuss further with EP MD   Signed, Charlies Macario Arthur, PA-C  05/16/2024 12:11 PM    I have seen and examined this patient with Charlies Arthur.  Agree with above, note added to reflect my findings.  Patient sent to emergency room by her primary physician.  She presented to her PCPs office with episodes of fatigue and weakness her primary physician an EKG which showed sinus rhythm and 2 1 AV block with heart rates in the 40s.  She had a QRS complex patient states that she has been tired and fatigued for quite some time and headaches as well as back pain.  Her family feels like her level of fatigue has been increasing.  GEN: No acute distress.   Neck: No JVD Cardiac: Bradycardic, no murmurs, rubs, or gallops.  Respiratory: normal BS bases bilaterally. GI: Soft, nontender, non-distended  MS: No edema; No deformity. Neuro:  Nonfocal  Skin: warm and dry Psych: Normal affect    Second-degree 2-1 AV block: Patient would likely benefit from pacemaker  implantation.  Along discussion.  Patient was initially hesitant, but with discussion with patient's family, she has agreed to proceed with pacemaker.  Cherell Colvin plan for pacemaker implantation tomorrow..  No indication for temporary pacing wire at this time.  Lavonda Thal M. Tanya Marvin MD 05/16/2024 6:07 PM

## 2024-05-17 ENCOUNTER — Encounter (HOSPITAL_COMMUNITY): Payer: Self-pay | Admitting: Cardiology

## 2024-05-17 ENCOUNTER — Encounter (HOSPITAL_COMMUNITY)
Admission: EM | Disposition: A | Discharge status: Home / Self Care | Payer: Self-pay | Source: Ambulatory Visit | Attending: Cardiology

## 2024-05-17 ENCOUNTER — Telehealth: Payer: Self-pay | Admitting: Cardiology

## 2024-05-17 ENCOUNTER — Other Ambulatory Visit: Payer: Self-pay

## 2024-05-17 ENCOUNTER — Encounter: Payer: Self-pay | Admitting: Emergency Medicine

## 2024-05-17 DIAGNOSIS — E039 Hypothyroidism, unspecified: Secondary | ICD-10-CM | POA: Diagnosis present

## 2024-05-17 DIAGNOSIS — K219 Gastro-esophageal reflux disease without esophagitis: Secondary | ICD-10-CM | POA: Diagnosis present

## 2024-05-17 DIAGNOSIS — E785 Hyperlipidemia, unspecified: Secondary | ICD-10-CM | POA: Diagnosis present

## 2024-05-17 DIAGNOSIS — R001 Bradycardia, unspecified: Secondary | ICD-10-CM | POA: Diagnosis present

## 2024-05-17 DIAGNOSIS — I442 Atrioventricular block, complete: Secondary | ICD-10-CM | POA: Diagnosis not present

## 2024-05-17 DIAGNOSIS — R5383 Other fatigue: Secondary | ICD-10-CM | POA: Diagnosis present

## 2024-05-17 DIAGNOSIS — Z950 Presence of cardiac pacemaker: Secondary | ICD-10-CM | POA: Diagnosis not present

## 2024-05-17 DIAGNOSIS — Z79890 Hormone replacement therapy (postmenopausal): Secondary | ICD-10-CM | POA: Diagnosis not present

## 2024-05-17 DIAGNOSIS — I872 Venous insufficiency (chronic) (peripheral): Secondary | ICD-10-CM | POA: Diagnosis present

## 2024-05-17 DIAGNOSIS — G629 Polyneuropathy, unspecified: Secondary | ICD-10-CM | POA: Diagnosis present

## 2024-05-17 DIAGNOSIS — Z800 Family history of malignant neoplasm of digestive organs: Secondary | ICD-10-CM | POA: Diagnosis not present

## 2024-05-17 DIAGNOSIS — Z8249 Family history of ischemic heart disease and other diseases of the circulatory system: Secondary | ICD-10-CM | POA: Diagnosis not present

## 2024-05-17 DIAGNOSIS — G8929 Other chronic pain: Secondary | ICD-10-CM | POA: Diagnosis present

## 2024-05-17 DIAGNOSIS — I441 Atrioventricular block, second degree: Secondary | ICD-10-CM | POA: Diagnosis present

## 2024-05-17 DIAGNOSIS — Z886 Allergy status to analgesic agent status: Secondary | ICD-10-CM | POA: Diagnosis not present

## 2024-05-17 HISTORY — PX: PACEMAKER IMPLANT: EP1218

## 2024-05-17 LAB — BASIC METABOLIC PANEL WITH GFR
Anion gap: 8 (ref 5–15)
BUN: 11 mg/dL (ref 8–23)
CO2: 23 mmol/L (ref 22–32)
Calcium: 9.2 mg/dL (ref 8.9–10.3)
Chloride: 103 mmol/L (ref 98–111)
Creatinine, Ser: 0.96 mg/dL (ref 0.44–1.00)
GFR, Estimated: 56 mL/min — ABNORMAL LOW (ref >60)
Glucose, Bld: 92 mg/dL (ref 70–99)
Potassium: 4.2 mmol/L (ref 3.5–5.1)
Sodium: 134 mmol/L — ABNORMAL LOW (ref 135–145)

## 2024-05-17 LAB — SURGICAL PCR SCREEN
MRSA, PCR: NEGATIVE
Staphylococcus aureus: POSITIVE — AB

## 2024-05-17 SURGERY — PACEMAKER IMPLANT

## 2024-05-17 MED ORDER — SODIUM CHLORIDE 0.9 % IV SOLN
250.0000 mL | INTRAVENOUS | Status: DC
  Administered 2024-05-17: 250 mL via INTRAVENOUS

## 2024-05-17 MED ORDER — MIDAZOLAM HCL 5 MG/5ML IJ SOLN
INTRAMUSCULAR | Status: DC | PRN
  Administered 2024-05-17: 1 mg via INTRAVENOUS

## 2024-05-17 MED ORDER — SODIUM CHLORIDE 0.9 % IV SOLN
INTRAVENOUS | Status: AC
  Filled 2024-05-17: qty 2

## 2024-05-17 MED ORDER — FENTANYL CITRATE (PF) 100 MCG/2ML IJ SOLN
INTRAMUSCULAR | Status: AC
  Filled 2024-05-17: qty 2

## 2024-05-17 MED ORDER — CHLORHEXIDINE GLUCONATE 4 % EX SOLN
60.0000 mL | Freq: Once | CUTANEOUS | Status: AC
  Administered 2024-05-17: 4 via TOPICAL

## 2024-05-17 MED ORDER — HEPARIN (PORCINE) IN NACL 1000-0.9 UT/500ML-% IV SOLN
INTRAVENOUS | Status: DC | PRN
  Administered 2024-05-17: 500 mL

## 2024-05-17 MED ORDER — LIDOCAINE HCL (PF) 1 % IJ SOLN
INTRAMUSCULAR | Status: AC
  Filled 2024-05-17: qty 30

## 2024-05-17 MED ORDER — SODIUM CHLORIDE 0.9 % IV SOLN
80.0000 mg | INTRAVENOUS | Status: AC
  Administered 2024-05-17: 80 mg

## 2024-05-17 MED ORDER — LIDOCAINE HCL (PF) 1 % IJ SOLN
INTRAMUSCULAR | Status: AC
  Filled 2024-05-17: qty 60

## 2024-05-17 MED ORDER — SODIUM CHLORIDE 0.9% FLUSH: 10.0000 mL | INTRAVENOUS | Status: DC | PRN

## 2024-05-17 MED ORDER — CHLORHEXIDINE GLUCONATE CLOTH 2 % EX PADS
6.0000 | MEDICATED_PAD | Freq: Every day | CUTANEOUS | Status: DC
  Administered 2024-05-18: 6 via TOPICAL

## 2024-05-17 MED ORDER — SODIUM CHLORIDE 0.45 % IV SOLN: INTRAVENOUS | Status: DC

## 2024-05-17 MED ORDER — CEFAZOLIN SODIUM-DEXTROSE 2-4 GM/100ML-% IV SOLN: 2.0000 g | INTRAVENOUS | Status: AC

## 2024-05-17 MED ORDER — LIDOCAINE HCL (PF) 1 % IJ SOLN
INTRAMUSCULAR | Status: DC | PRN
  Administered 2024-05-17: 60 mL

## 2024-05-17 MED ORDER — CHLORHEXIDINE GLUCONATE 4 % EX SOLN
60.0000 mL | Freq: Once | CUTANEOUS | Status: AC
  Administered 2024-05-17: 4 via TOPICAL
  Filled 2024-05-17: qty 15

## 2024-05-17 MED ORDER — ONDANSETRON HCL 4 MG/2ML IJ SOLN: 4.0000 mg | Freq: Four times a day (QID) | INTRAMUSCULAR | Status: DC | PRN

## 2024-05-17 MED ORDER — SODIUM CHLORIDE 0.9 % IV SOLN: 250.0000 mL | INTRAVENOUS | Status: DC

## 2024-05-17 MED ORDER — SODIUM CHLORIDE 0.9% FLUSH: 3.0000 mL | INTRAVENOUS | Status: DC | PRN

## 2024-05-17 MED ORDER — MUPIROCIN 2 % EX OINT
1.0000 | TOPICAL_OINTMENT | Freq: Two times a day (BID) | CUTANEOUS | Status: DC
  Administered 2024-05-17 – 2024-05-18 (×3): 1 via NASAL
  Filled 2024-05-17: qty 22

## 2024-05-17 MED ORDER — FENTANYL CITRATE (PF) 100 MCG/2ML IJ SOLN
INTRAMUSCULAR | Status: DC | PRN
  Administered 2024-05-17: 25 ug via INTRAVENOUS

## 2024-05-17 MED ORDER — CEFAZOLIN SODIUM-DEXTROSE 2-4 GM/100ML-% IV SOLN
INTRAVENOUS | Status: AC
  Administered 2024-05-17: 2 g via INTRAVENOUS
  Filled 2024-05-17: qty 100

## 2024-05-17 MED ORDER — MIDAZOLAM HCL 2 MG/2ML IJ SOLN
INTRAMUSCULAR | Status: AC
  Filled 2024-05-17: qty 2

## 2024-05-17 SURGICAL SUPPLY — 12 items
CABLE SURGICAL S-101-97-12 (CABLE) ×1 IMPLANT
CATH RIGHTSITE C315HIS02 (CATHETERS) IMPLANT
IPG PACE AZUR XT DR MRI W1DR01 (Pacemaker) IMPLANT
KIT MICROPUNCTURE NIT STIFF (SHEATH) IMPLANT
LEAD CAPSURE NOVUS 5076-52CM (Lead) IMPLANT
LEAD SELECT SECURE 3830 383069 (Lead) IMPLANT
PAD DEFIB RADIO PHYSIO CONN (PAD) ×1 IMPLANT
SHEATH 7FR PRELUDE SNAP 13 (SHEATH) IMPLANT
SHEATH PROBE COVER 6X72 (BAG) IMPLANT
SLITTER 6232ADJ (MISCELLANEOUS) IMPLANT
TRAY PACEMAKER INSERTION (PACKS) ×1 IMPLANT
WIRE HI TORQ VERSACORE-J 145CM (WIRE) IMPLANT

## 2024-05-17 NOTE — Telephone Encounter (Signed)
 Please disregard. Spoke w/ patients daughter Inocente, they do not want to switch providers.

## 2024-05-17 NOTE — Progress Notes (Signed)
 Mobility Specialist Progress Note;   05/17/24 1102  Mobility  Activity Ambulated with assistance to bathroom;Ambulated with assistance in room  Level of Assistance Standby assist, set-up cues, supervision of patient - no hands on  Assistive Device Front wheel walker  Distance Ambulated (ft) 15 ft  Activity Response Tolerated well  Mobility Referral Yes  Mobility visit 1 Mobility  Mobility Specialist Start Time (ACUTE ONLY) 1102  Mobility Specialist Stop Time (ACUTE ONLY) 1115  Mobility Specialist Time Calculation (min) (ACUTE ONLY) 13 min   Answered pts call light, requesting assistance back to bed from BR. Required no physical assistance during ambulation, SV. Pericare performed for pt. Pt returned safely back to bed, left with all needs met. Alarm on.   Lauraine Erm Mobility Specialist Please contact via SecureChat or [employer] [phone]

## 2024-05-17 NOTE — Telephone Encounter (Signed)
 Patient is a patient of Dr. Inocencio. However, per secure chat from [place], [place] - due to Dr. Nancey implanting PPM today 7/3, patient would like to stay with Dr. Nancey.

## 2024-05-17 NOTE — Plan of Care (Signed)

## 2024-05-17 NOTE — TOC CM/SW Note (Signed)
 Transition of Care [facility]) - Inpatient Brief Assessment   Patient Details  Name: Cynthia Young MRN: [id number] Date of Birth: 12/26/1929  [facility] CM/SW Contact:    Lauraine FORBES Saa, LCSW Phone Number: 05/17/2024, 10:08 AM   Clinical Narrative:  10:08 AM Per chart review, patient resides at home alone. Patient has a PCP and insurance. Patient does not have SNF or HH history. Patients has DME (cane, four wheeled rolling walker) history with [employer]. Patient's preferred [facility] is [facility] [address] [place]. No TOC needs were identified at this time. TOC will continue to follow and be available to assist.  Transition of Care Asessment: Insurance and Status: Insurance coverage has been reviewed Patient has primary care physician: Yes Home environment has been reviewed: Private Residence Prior level of function:: N/A Prior/Current Home Services: No current home services Social Drivers of Health Review: SDOH reviewed no interventions necessary Readmission risk has been reviewed: Yes Transition of care needs: no transition of care needs at this time

## 2024-05-17 NOTE — Discharge Instructions (Signed)
 After Your Pacemaker     ACTIVITY Do not lift your arm above shoulder height for 1 week after your procedure. After 7 days, you may progress as below.  You should remove your sling 24 hours after your procedure, unless otherwise instructed by your provider.     Thursday May 24, 2024  Friday May 25, 2024 Saturday May 26, 2024 Sunday May 27, 2024   Do not lift, push, pull, or carry anything over 10 pounds with the affected arm until 6 weeks (Thursday June 28, 2024 ) after your procedure.   You may drive AFTER your wound check, unless you have been told otherwise by your provider.   Ask your healthcare provider when you can go back to work   INCISION/Dressing If you are on a blood thinner such as Coumadin, Xarelto, Eliquis, Plavix, or Pradaxa please confirm with your provider when this should be resumed.   If large square, outer bandage is left in place, this can be removed after 24 hours from your procedure. Do not remove steri-strips or glue as below.   If a PRESSURE DRESSING (a bulky dressing that usually goes up over your shoulder) was applied or left in place, please follow instructions given by your provider on when to return to have this removed.   Monitor your Pacemaker site for redness, swelling, and drainage. Call the device clinic at [phone] if you experience these symptoms or fever/chills.  If your incision is sealed with Steri-strips or staples, you may shower 7 days after your procedure or when told by your provider. Do not remove the steri-strips or let the shower hit directly on your site. You may wash around your site with soap and water.    If you were discharged in a sling, please do not wear this during the day more than 48 hours after your surgery unless otherwise instructed. This may increase the risk of stiffness and soreness in your shoulder.   Avoid lotions, ointments, or perfumes over your incision until it is well-healed.  You may use a hot tub or  a pool AFTER your wound check appointment if the incision is completely closed.  Pacemaker Alerts:  Some alerts are vibratory and others beep. These are NOT emergencies. Please call our office to let us  know. If this occurs at night or on weekends, it can wait until the next business day. Send a remote transmission.  If your device is capable of reading fluid status (for heart failure), you will be offered monthly monitoring to review this with you.   DEVICE MANAGEMENT Remote monitoring is used to monitor your pacemaker from home. This monitoring is scheduled every 91 days by our office. It allows us  to keep an eye on the functioning of your device to ensure it is working properly. You will routinely see your Electrophysiologist annually (more often if necessary).   You should receive your ID card for your new device in 4-8 weeks. Keep this card with you at all times once received. Consider wearing a medical alert bracelet or necklace.  Your Pacemaker may be MRI compatible. This will be discussed at your next office visit/wound check.  You should avoid contact with strong electric or magnetic fields.   Do not use amateur (ham) radio equipment or electric (arc) welding torches. MP3 player headphones with magnets should not be used. Some devices are safe to use if held at least 12 inches (30 cm) from your Pacemaker. These include power tools, lawn mowers, and speakers. If  you are unsure if something is safe to use, ask your health care provider.  When using your cell phone, hold it to the ear that is on the opposite side from the Pacemaker. Do not leave your cell phone in a pocket over the Pacemaker.  You may safely use electric blankets, heating pads, computers, and microwave ovens.  Call the office right away if: You have chest pain. You feel more short of breath than you have felt before. You feel more light-headed than you have felt before. Your incision starts to open up.  This  information is not intended to replace advice given to you by your health care provider. Make sure you discuss any questions you have with your health care provider.

## 2024-05-17 NOTE — Plan of Care (Signed)
  Problem: Education: Goal: Understanding of cardiac disease, CV risk reduction, and recovery process will improve Outcome: Progressing Goal: Individualized Educational Video(s) Outcome: Progressing   Problem: Cardiac: Goal: Ability to achieve and maintain adequate cardiovascular perfusion will improve Outcome: Progressing   Problem: Health Behavior/Discharge Planning: Goal: Ability to safely manage health-related needs after discharge will improve Outcome: Progressing   Problem: Education: Goal: Knowledge of General Education information will improve Description: Including pain rating scale, medication(s)/side effects and non-pharmacologic comfort measures Outcome: Progressing   Problem: Health Behavior/Discharge Planning: Goal: Ability to manage health-related needs will improve Outcome: Progressing   Problem: Clinical Measurements: Goal: Ability to maintain clinical measurements within normal limits will improve Outcome: Progressing Goal: Will remain free from infection Outcome: Progressing Goal: Diagnostic test results will improve Outcome: Progressing Goal: Respiratory complications will improve Outcome: Progressing Goal: Cardiovascular complication will be avoided Outcome: Progressing   Problem: Activity: Goal: Risk for activity intolerance will decrease Outcome: Progressing   Problem: Nutrition: Goal: Adequate nutrition will be maintained Outcome: Progressing   Problem: Coping: Goal: Level of anxiety will decrease Outcome: Progressing   Problem: Elimination: Goal: Will not experience complications related to bowel motility Outcome: Progressing Goal: Will not experience complications related to urinary retention Outcome: Progressing   Problem: Pain Managment: Goal: General experience of comfort will improve and/or be controlled Outcome: Progressing   Problem: Safety: Goal: Ability to remain free from injury will improve Outcome: Progressing

## 2024-05-17 NOTE — Progress Notes (Signed)
 Rounding Note   Patient Name: Cynthia Young Date of Encounter: 05/17/2024  [facility] Cardiologist: Darryle ONEIDA Decent, MD   Subjective  Asleep, wakes easily, with brisk increase in her HRs 26 >> 30's No new complaints  Scheduled Meds:  chlorhexidine  60 mL Topical Once   famotidine   20 mg Oral Daily   gentamicin (GARAMYCIN) 80 mg in sodium chloride  0.9 % 500 mL irrigation  80 mg Irrigation On Call   levothyroxine   50 mcg Oral Daily   sodium chloride  flush  3 mL Intravenous Q12H   Continuous Infusions:  sodium chloride       ceFAZolin (ANCEF) IV     PRN Meds: nitroGLYCERIN, sodium chloride  flush, sodium chloride  flush   Vital Signs  Vitals:   05/17/24 0430 05/17/24 0443 05/17/24 0600 05/17/24 0713  BP: (!) 146/74  130/60   Pulse: (!) 30  (!) 29 (!) 29  Resp: 14  14 13   Temp:   97.9 F (36.6 C) 97.8 F (36.6 C)  TempSrc:   Oral Oral  SpO2: 98%  94% 98%  Weight:  48.2 kg    Height:  4' 11 (1.499 m)      Intake/Output Summary (Last 24 hours) at 05/17/2024 0800 Last data filed at 05/17/2024 9366 Gross per 24 hour  Intake 5.61 ml  Output --  Net 5.61 ml      05/17/2024    4:43 AM 05/16/2024    9:05 AM 05/12/2024    3:48 PM  Last 3 Weights  Weight (lbs) 106 lb 4.2 oz 106 lb 3.2 oz 108 lb 15.9 oz  Weight (kg) 48.2 kg 48.172 kg 49.44 kg      Telemetry 2:1 AVblock > rates dipping 26-29 overnight, 30's otherwise  - Personally Reviewed  ECG  No new EKGs - Personally Reviewed  Physical Exam  GEN: No acute distress.   Neck: No JVD Cardiac: RRR, bradycardicno murmurs, rubs, or gallops.  Respiratory: CTA b/l GI: Soft, nontender, non-distended  MS: No edema, age appropriate perhaps advanced atrophy Neuro:  Nonfocal  Psych: Normal affect   Labs High Sensitivity Troponin:  No results for input(s): TROPONINIHS in the last 720 hours.   Chemistry Recent Labs  Lab 05/12/24 1630 05/16/24 1305  NA 132* 132*  K 4.0 4.2  CL 97* 99  CO2 23 23  GLUCOSE 99  88  BUN 13 11  CREATININE 0.79 0.81  CALCIUM 9.6 9.2  MG 2.2 2.1  PROT 6.7 6.2*  ALBUMIN 4.3 3.6  AST 20 20  ALT 10 10  ALKPHOS 102 67  BILITOT 0.3 0.5  GFRNONAA >60 >60  ANIONGAP 13 10    Lipids No results for input(s): CHOL, TRIG, HDL, LABVLDL, LDLCALC, CHOLHDL in the last 168 hours.  Hematology Recent Labs  Lab 05/12/24 1630 05/16/24 1305  WBC 8.1 8.9  RBC 4.19 3.98  HGB 12.7 12.2  HCT 38.4 37.7  MCV 91.6 94.7  MCH 30.3 30.7  MCHC 33.1 32.4  RDW 13.3 13.3  PLT 329 289   Thyroid   Recent Labs  Lab 05/16/24 1305  TSH 1.977  FREET4 1.02    BNPNo results for input(s): BNP, PROBNP in the last 168 hours.  DDimer No results for input(s): DDIMER in the last 168 hours.   Radiology  No results found.  Cardiac Studies  10/24/2023: TTE  1. Left ventricular ejection fraction, by estimation, is 60 to 65%. The  left ventricle has normal function. The left ventricle has no regional  wall motion abnormalities. Left ventricular diastolic parameters were  normal.   2. Right ventricular systolic function is normal. The right ventricular  size is normal.   3. The mitral valve is abnormal. Mild mitral valve regurgitation. No  evidence of mitral stenosis.   4. The aortic valve is tricuspid. There is moderate calcification of the  aortic valve. There is moderate thickening of the aortic valve. Aortic  valve regurgitation is trivial. Mild aortic valve stenosis.   5. The inferior vena cava is normal in size with greater than 50%  respiratory variability, suggesting right atrial pressure of 3 mmHg.   Patient Profile   89 y.o. female w/PMHx of  GERD, HLD, venous insufficiency, Hypothyroidism Long hx of Mobitz one and two AV block w/2:1 conduction  Admitted with marked bradycardia  C/o chronic dull frontal headaches ~ year or longer Epigastric pain radiates to chest and abdomen with abdominal bloating  also reported as chronic for years with a known hiatal  hernia Progressive dizziness, more often then oer the last few years Fatigue, no energy > more often longer lasting then historically  Assessment & Plan   Advanced heart block Marked bradycardia, symptomatic Largely 2:1 AV block, asleep rates 20s-30 or so Awake rates 30's with evidence of mobitz one  Recommend PPM Dr. Nancey has been bedside, she remains agreeable to proceed Narrow QRS Preserved LVEF by her echo in Dec 2024  3.  Hypothyroidism TSH has been stable Home synthroid    For questions or updates, please contact [facility] Please consult [email or web address] for contact info under     Signed, Charlies Macario Arthur, PA-C  05/17/2024, 8:00 AM

## 2024-05-17 NOTE — Progress Notes (Signed)
 Mobility Specialist Progress Note;   05/17/24 0950  Mobility  Activity Refused mobility   RN deferring mobility at this time d/t lower HR and waiting for pace maker procedure.   Lauraine Erm Mobility Specialist Please contact via SecureChat or [employer] [phone]

## 2024-05-18 ENCOUNTER — Inpatient Hospital Stay (HOSPITAL_COMMUNITY)

## 2024-05-18 DIAGNOSIS — I442 Atrioventricular block, complete: Secondary | ICD-10-CM

## 2024-05-18 DIAGNOSIS — Z950 Presence of cardiac pacemaker: Secondary | ICD-10-CM | POA: Diagnosis not present

## 2024-05-18 MED ORDER — HYDROCODONE-IBUPROFEN 7.5-200 MG PO TABS: 1.0000 | ORAL_TABLET | Freq: Once | ORAL | Status: DC | PRN

## 2024-05-18 MED ORDER — ACETAMINOPHEN 325 MG PO TABS
650.0000 mg | ORAL_TABLET | Freq: Four times a day (QID) | ORAL | Status: DC | PRN
  Administered 2024-05-18: 650 mg via ORAL
  Filled 2024-05-18: qty 2

## 2024-05-18 MED ORDER — ENSURE PLUS HIGH PROTEIN PO LIQD: 237.0000 mL | Freq: Two times a day (BID) | ORAL | Status: DC

## 2024-05-18 MED ORDER — OXYCODONE HCL 5 MG PO TABS
5.0000 mg | ORAL_TABLET | Freq: Once | ORAL | Status: AC | PRN
  Administered 2024-05-18: 5 mg via ORAL
  Filled 2024-05-18: qty 1

## 2024-05-18 MED ORDER — OXYCODONE HCL 5 MG PO TABS
5.0000 mg | ORAL_TABLET | Freq: Four times a day (QID) | ORAL | Status: DC | PRN
  Filled 2024-05-18: qty 1

## 2024-05-18 NOTE — Progress Notes (Signed)
 Pt refusing to ambulate halls with assistance, states wants to nap. Pt educated about mobility.Patient can walk independently to bathroom in room.

## 2024-05-18 NOTE — Progress Notes (Signed)
 Patient refusing to ambulate the halls.

## 2024-05-18 NOTE — Progress Notes (Signed)
    Went to talk with the patient as she was refusing discharge this morning. Expressed frustration about difficulty sleeping and breakfast this morning. Also has chronic back pain which has limited her sleep as well. Was having pain along her PPM site this morning but improved with Tylenol . The patient was resting at the initial time of this encounter and appeared comfortable. We discussed some pain is expected and she can take Tylenol  as needed (listed as allergy but reports taking this and tolerating well). She is planning to contact family in regards to coming to pick her up and feels comfortable with discharge home this afternoon. Reviewed with patient's nurse as well.   Signed, Laymon CHRISTELLA Qua, PA-C 05/18/2024, 11:22 AM Pager: [phone]

## 2024-05-18 NOTE — Progress Notes (Signed)
 Rounding Note   Patient Name: Cynthia Young Date of Encounter: 05/18/2024  [facility] Cardiologist: Darryle ONEIDA Decent, MD   Subjective  Asleep, wakes easily, 100% V-paced, intermittently atrial pacing at 60 bpm. She complains of discomfort at the pacing site. She received a dose of oxycodone  this AM.  Scheduled Meds:  Chlorhexidine  Gluconate Cloth  6 each Topical Daily   famotidine   20 mg Oral Daily   feeding supplement  237 mL Oral BID BM   levothyroxine   50 mcg Oral Daily   mupirocin  ointment  1 Application Nasal BID   sodium chloride  flush  3 mL Intravenous Q12H   Continuous Infusions:   PRN Meds: nitroGLYCERIN , ondansetron  (ZOFRAN ) IV, sodium chloride  flush   Vital Signs  Vitals:   05/17/24 1835 05/17/24 2052 05/17/24 2354 05/18/24 0403  BP: (!) 95/58 130/64 (!) 155/86 114/65  Pulse: 60 65 61 61  Resp: 16 16 17 14   Temp:  98.7 F (37.1 C) 98.3 F (36.8 C) 98.3 F (36.8 C)  TempSrc:  Oral Oral Oral  SpO2: 94% 96% 95% 96%  Weight:      Height:        Intake/Output Summary (Last 24 hours) at 05/18/2024 0734 Last data filed at 05/17/2024 1400 Gross per 24 hour  Intake 275.13 ml  Output --  Net 275.13 ml      05/17/2024    4:43 AM 05/16/2024    9:05 AM 05/12/2024    3:48 PM  Last 3 Weights  Weight (lbs) 106 lb 4.2 oz 106 lb 3.2 oz 108 lb 15.9 oz  Weight (kg) 48.2 kg 48.172 kg 49.44 kg      Telemetry A-V paced  ECG  No new EKGs - Personally Reviewed  Physical Exam  GEN: No acute distress.   Neck: No JVD Cardiac: RRR The device site is normal -- no tenderness, edema, drainage, redness, threatened erosion.  Respiratory: breathing easily   Labs High Sensitivity Troponin:  No results for input(s): TROPONINIHS in the last 720 hours.   Chemistry Recent Labs  Lab 05/12/24 1630 05/16/24 1305 05/17/24 0754  NA 132* 132* 134*  K 4.0 4.2 4.2  CL 97* 99 103  CO2 23 23 23   GLUCOSE 99 88 92  BUN 13 11 11   CREATININE 0.79 0.81 0.96  CALCIUM  9.6 9.2 9.2  MG 2.2 2.1  --   PROT 6.7 6.2*  --   ALBUMIN 4.3 3.6  --   AST 20 20  --   ALT 10 10  --   ALKPHOS 102 67  --   BILITOT 0.3 0.5  --   GFRNONAA >60 >60 56*  ANIONGAP 13 10 8     Lipids No results for input(s): CHOL, TRIG, HDL, LABVLDL, LDLCALC, CHOLHDL in the last 168 hours.  Hematology Recent Labs  Lab 05/12/24 1630 05/16/24 1305  WBC 8.1 8.9  RBC 4.19 3.98  HGB 12.7 12.2  HCT 38.4 37.7  MCV 91.6 94.7  MCH 30.3 30.7  MCHC 33.1 32.4  RDW 13.3 13.3  PLT 329 289   Thyroid   Recent Labs  Lab 05/16/24 1305  TSH 1.977  FREET4 1.02    BNPNo results for input(s): BNP, PROBNP in the last 168 hours.  DDimer No results for input(s): DDIMER in the last 168 hours.   Radiology    Cardiac Studies  10/24/2023: TTE  1. Left ventricular ejection fraction, by estimation, is 60 to 65%. The  left ventricle has normal function. The left  ventricle has no regional  wall motion abnormalities. Left ventricular diastolic parameters were  normal.   2. Right ventricular systolic function is normal. The right ventricular  size is normal.   3. The mitral valve is abnormal. Mild mitral valve regurgitation. No  evidence of mitral stenosis.   4. The aortic valve is tricuspid. There is moderate calcification of the  aortic valve. There is moderate thickening of the aortic valve. Aortic  valve regurgitation is trivial. Mild aortic valve stenosis.   5. The inferior vena cava is normal in size with greater than 50%  respiratory variability, suggesting right atrial pressure of 3 mmHg.   Patient Profile   89 y.o. female w/PMHx of  GERD, HLD, venous insufficiency, Hypothyroidism Long hx of Mobitz one and two AV block w/2:1 conduction  Admitted with marked bradycardia  C/o chronic dull frontal headaches ~ year or longer Epigastric pain radiates to chest and abdomen with abdominal bloating  also reported as chronic for years with a known hiatal hernia Progressive  dizziness, more often then oer the last few years Fatigue, no energy > more often longer lasting then historically  Assessment & Plan   2:1 heart block, symptomatic bradycardia - s/p Medtronic dual chamber pacemaker  - RV septal lead placement  I reviewed the chest xray -- lead position looks good. No pneumothorax. Device site look good -- no evidence of infection, dehiscence, or hematoma.  Clarified with the patient that she is not allergic to tylenol  and she takes tylenol  all the time.  Hypothyroidism TSH has been stable Home synthroid   Patient is suitable for discharge from the EP perspective.   For questions or updates, please contact [facility] Please consult [email or web address] for contact info under     Signed, Eulas FORBES Furbish, MD  05/18/2024, 7:34 AM

## 2024-05-18 NOTE — Discharge Summary (Signed)
 ELECTROPHYSIOLOGY PROCEDURE DISCHARGE SUMMARY    Patient ID: Cynthia Young,  MRN: [id number], DOB/AGE: 01-02-1930 89 y.o.  Admit date: 05/16/2024 Discharge date: 05/18/2024  Primary Care Physician: Domenica Harlene LABOR, MD  Primary Cardiologist: Darryle ONEIDA Decent, MD  Electrophysiologist: Dr. Inocencio   Primary Discharge Diagnosis:  Symptomatic bradycardia status post pacemaker implantation this admission  Secondary Discharge Diagnosis:  2nd Degree Heart Block Hypothyroidism   Allergies  Allergen Reactions   Tylenol  [Acetaminophen ]     Mixed with Advil  - Generalized tremor     Procedures This Admission:  1.  Implantation of a Medtronic Dual chamber PPM.  There were no immediate post procedure complications.   2.  CXR on 05/18/2024 demonstrated no pneumothorax status post device implantation.   PREPROCEDURE DIAGNOSES:   1. Irreversible symptomatic bradycardia due to second degree AV block     POSTPROCEDURE DIAGNOSES:   1. Irreversible symptomatic bradycardia due to second degree AV block     PROCEDURES:    1. Dual chamber permanent pacemaker implantation     INTRODUCTION: Cynthia Young is a 89 y.o. patient who presents to the EP lab for dual chamber permanent pacemaker implantation.      DESCRIPTION OF PROCEDURE:  Informed written consent was obtained and the patient was brought to the electrophysiology lab in the fasting state. The patient was adequately sedated with intravenous Versed , and fentanyl  as outlined in the nursing report.  The patient's left chest was prepped and draped in the usual sterile fashion by the EP lab staff.  The skin overlying the left deltopectoral region was infiltrated with lidocaine  for local analgesia.  An incision was created over the left deltopectoral region.  A left subcutaneous pocket was fashioned using a combination of sharp and blunt dissection immediately superficial to the pectoral fascia.  Electrocautery was used to assure hemostasis.     Lead Placement: The left axillary vein was cannulated using modified seldinger techniques.  Through the left axillary vein, an RV pace/sense lead was advanced to the RV mid septum and advanced deeply, pausing intermittently to assess lead parameters. The lead was secured to the pectoral fascia. Next, the right atrial lead was advanced to the RA appendage. It was secured to the pectoral fascia.  Lead parameters are detailed below.     RA RV  Model [id number] [id number]  Serial # [id number]  Amplitude 3.4 mV NA mV  Threshold 1.25 V @ 0.4 S 1.0 V @ 0.4 S  Impedence 532 ohms 646 ohms      The pocket was irrigated with copious gentamicin  solution.  The leads were then  connected to a pulse generator (model Medtronic Azure, serial [id number]). The pocket was closed in layers of absorbable suture. EBL < 10mL. Steri-strips and a sterile dressing were applied.    Brief HPI: Cynthia Young is a 89 y.o. female admitted for second-degree 2:1 AV Block and electrophysiology team asked to see for consideration of PPM implantation. Past medical history includes hypothyroidism.   She presented to [facility] ED on 05/16/2024 for evaluation of lightheadedness and dizziness. Had been evaluated by her PCP and found to be in high-grade AV block, therefore she was sent to the ED for further evaluation. Was found to have Mobitz 1 and 2 AV block with 2-1 conduction. The patient has had AV block without reversible causes identified. Risks, benefits, and alternatives to PPM implantation were reviewed with the patient who wished to proceed.   Hospital Course:  The patient was admitted  and underwent implantation of a Medtronic dual chamber PPM on 05/17/2024  with details as outlined above. She was monitored on telemetry overnight which demonstrated appropriate pacing. Left chest was without hematoma or ecchymosis. The device was interrogated and found to be functioning normally.  CXR was obtained and demonstrated no  pneumothorax status post device implantation.  Wound care, arm mobility, and restrictions were reviewed with the patient.  The patient was examined and considered stable for discharge to home.    Anticoagulation resumption This patient is not on anticoagulation.   Physical Vitals: Vitals:   05/17/24 2052 05/17/24 2354 05/18/24 0403 05/18/24 0758  BP: 130/64 (!) 155/86 114/65 (!) 164/78  Pulse: 65 61 61 60  Resp: 16 17 14 12   Temp: 98.7 F (37.1 C) 98.3 F (36.8 C) 98.3 F (36.8 C) 98.4 F (36.9 C)  TempSrc: Oral Oral Oral Oral  SpO2: 96% 95% 96% 95%  Weight:      Height:        Discharge Medications:  Allergies as of 05/18/2024       Reactions   Tylenol  [acetaminophen ]    Mixed with Advil  - Generalized tremor        Medication List     TAKE these medications    acetaminophen  500 MG tablet Commonly known as: TYLENOL  Take 500-1,000 mg by mouth every 6 (six) hours as needed for mild pain (pain score 1-3) or moderate pain (pain score 4-6).   famotidine  20 MG tablet Commonly known as: PEPCID  Take 1 tablet (20 mg total) by mouth daily. What changed:  how much to take when to take this   levothyroxine  50 MCG tablet Commonly known as: SYNTHROID  TAKE ONE TABLET BY MOUTH ONE TIME DAILY   Magnesium  250 MG Tabs Take 1 tablet (250 mg total) by mouth 2 (two) times daily.        Disposition:  Home with usual follow up as in AVS   Duration of Discharge Encounter:  APP time: 22 minutes  Signed, Laymon CHRISTELLA Qua, PA-C  05/18/2024 8:34 AM \

## 2024-05-18 NOTE — TOC Transition Note (Signed)
 Transition of Care [facility]) - Discharge Note   Patient Details  Name: Cynthia Young MRN: [id number] Date of Birth: Dec 01, 1929  [facility] CM/SW Contact:  Waddell Barnie Rama, RN Phone Number: 05/18/2024, 8:34 AM   Clinical Narrative:    For dc today, has no needs.         Patient Goals and CMS Choice            Discharge Placement                       Discharge Plan and Services Additional resources added to the After Visit Summary for                                       Social Drivers of Health (SDOH) Interventions SDOH Screenings   Food Insecurity: No Food Insecurity (05/17/2024)  Housing: Low Risk  (05/17/2024)  Transportation Needs: No Transportation Needs (05/17/2024)  Utilities: Not At Risk (05/17/2024)  Alcohol Screen: Low Risk  (09/23/2023)  Depression (PHQ2-9): Low Risk  (09/23/2023)  Financial Resource Strain: Low Risk  (09/23/2023)  Physical Activity: Inactive (09/23/2023)  Social Connections: Moderately Integrated (05/17/2024)  Stress: No Stress Concern Present (09/23/2023)  Tobacco Use: Low Risk  (05/17/2024)  Health Literacy: Adequate Health Literacy (09/23/2023)     Readmission Risk Interventions     No data to display

## 2024-05-18 NOTE — Progress Notes (Signed)
 Pt educated on arm restrictions and pacemaker post op instructions. Education & AVS explained to patient.

## 2024-05-18 NOTE — Plan of Care (Signed)
  Problem: Activity: Goal: Ability to tolerate increased activity will improve Outcome: Progressing   Problem: Cardiac: Goal: Ability to achieve and maintain adequate cardiovascular perfusion will improve Outcome: Progressing   Problem: Education: Goal: Knowledge of General Education information will improve Description: Including pain rating scale, medication(s)/side effects and non-pharmacologic comfort measures Outcome: Progressing

## 2024-05-21 ENCOUNTER — Telehealth: Payer: Self-pay

## 2024-05-21 ENCOUNTER — Encounter (HOSPITAL_COMMUNITY): Payer: Self-pay | Admitting: Cardiovascular Disease

## 2024-05-22 ENCOUNTER — Telehealth: Payer: Self-pay

## 2024-05-22 ENCOUNTER — Ambulatory Visit: Payer: Self-pay

## 2024-05-22 NOTE — Telephone Encounter (Signed)
 Please review / advise in PCP absence.

## 2024-05-22 NOTE — Telephone Encounter (Signed)
 Notified pt. She is agreeable to contact cardiology regarding dizziness since procedure. She has a hospital follow up here on 7/16 and f/u with PCP on 07/02/24.

## 2024-05-22 NOTE — Telephone Encounter (Signed)
 FYI Only or Action Required?: Action required by provider: update on patient condition.  Patient was last seen in primary care on 05/16/2024 by Copland, Harlene BROCKS, MD.  Called Nurse Triage reporting Dizziness.  Symptoms began several days ago.  Interventions attempted: Rest, hydration, or home remedies.  Symptoms are: unchanged.  Triage Disposition: See Physician Within 24 Hours  Patient/caregiver understands and will follow disposition?: No, refuses disposition

## 2024-05-22 NOTE — Telephone Encounter (Signed)
 Returned patients call and she was advised to return the call to the number that had reached out to her. Patient stated that she will call the number back.  Copied from CRM [id number]. Topic: General - Call Back - No Documentation >> May 21, 2024  2:27 PM Mesmerise C wrote: Reason for CRM: Patient stated she received 2 calls today from a [facility] management chart showing a Christine Homsher patient stated the call from an 800 number wondering what it's regarding for the call aptient can be reached at [phone]

## 2024-05-22 NOTE — Telephone Encounter (Signed)
 Reason for Disposition  [1] MODERATE dizziness (e.g., vertigo; feels very unsteady, interferes with normal activities) AND [2] has NOT been evaluated by doctor (or NP/PA) for this  Answer Assessment - Initial Assessment Questions 1. DESCRIPTION: Describe your dizziness.     Carnival ride 2. VERTIGO: Do you feel like either you or the room is spinning or tilting?      yes 3. LIGHTHEADED: Do you feel lightheaded? (e.g., somewhat faint, woozy, weak upon standing)     Not currently 4. SEVERITY: How bad is it?  Can you walk?   - MILD: Feels slightly dizzy and unsteady, but is walking normally.   - MODERATE: Feels unsteady when walking, but not falling; interferes with normal activities (e.g., school, work).   - SEVERE: Unable to walk without falling, or requires assistance to walk without falling.     moderate 5. ONSET:  When did the dizziness begin?     7/5 6. AGGRAVATING FACTORS: Does anything make it worse? (e.g., standing, change in head position)     standing 7. CAUSE: What do you think is causing the dizziness?     pacemaker 8 9. OTHER SYMPTOMS: Do you have any other symptoms? (e.g., headache, weakness, numbness, vomiting, earache)     headaches    Pt had pacemaker placed on 7/3 and was discharged on 7/4. Pt stated vertigo started on 7/5. Pt experiences the vertigo when she stands up. Pt wants PCP to be aware of dizziness but doesn't want to make an appt at this time. Pt encouraged to let cardiologist be aware too. Pt is looking for number to call them. Please advise.  Protocols used: Dizziness - Vertigo-A-AH

## 2024-05-24 ENCOUNTER — Telehealth: Payer: Self-pay

## 2024-05-24 ENCOUNTER — Other Ambulatory Visit: Payer: Self-pay | Admitting: Family Medicine

## 2024-05-24 ENCOUNTER — Telehealth: Payer: Self-pay | Admitting: Cardiology

## 2024-05-24 ENCOUNTER — Ambulatory Visit: Admitting: Pulmonary Disease

## 2024-05-24 MED ORDER — MECLIZINE HCL 12.5 MG PO TABS: 12.5000 mg | ORAL_TABLET | Freq: Three times a day (TID) | ORAL | 0 refills | Status: DC | PRN

## 2024-05-24 MED FILL — Midazolam HCl Inj 2 MG/2ML (Base Equivalent): INTRAMUSCULAR | Qty: 1 | Status: AC

## 2024-05-24 NOTE — Telephone Encounter (Signed)
 Received call back from Dr. Leldon office.  They will follow up with Pt and contact cardiology if any further assistance needed.

## 2024-05-24 NOTE — Telephone Encounter (Signed)
 Pt states she's developed Vertigo since her procedure. Please advise

## 2024-05-24 NOTE — Telephone Encounter (Signed)
 Fortunato w/ cardiology called and stated that patient is worked up over dizziness/ vertigo. Also it is a message left in patients chart as well under encounters. Called patient and she wants to know if she could have a prescription for her vertigo and back pain.

## 2024-05-24 NOTE — Telephone Encounter (Signed)
 Spoke with pt regarding her vertigo. Pt stated she has had vertigo since getting her pacemaker on June 3rd. It is mostly when she gets up from a sitting or lying position that she feels dizzy and that the room is spinning. Pt stated is she stays still after moving that the vertigo mitigates. Pt has been using walker to steady herself. Pt stated she has been having headaches as well but no blurred vision. Pt's blood pressure was 141/72 the other day. Pt stated the headaches are constant but dull. Pt stated she also has occasional chest pain that goes away in seconds but admitted she has GERD and cannot tell if the pain is from that. Pt was told the information provided would be sent to Dr. Walton and his nurse, as well as our device team. Pt verbalized understanding. All questions if any were answered. Pt has upcoming appointment with PCP.

## 2024-05-24 NOTE — Telephone Encounter (Signed)
Patient was advised and verbalized understanding. 

## 2024-05-24 NOTE — Telephone Encounter (Signed)
 Returned call to Pt.  Per Pt she is experiencing vertigo.  This started the day after her pacemaker implant.  She states it is different than her normal dizziness.  She reports that it happens when she goes from sitting to standing.  Requested Pt send a manual transmission to evaluate pacemaker function.  Transmission received and reviewed.  PPM function is WNL.  No episodes noted.  Pt is AS/VP and paces in the V 100%.    Any residual effects from anesthesia given during surgery would be metabolized at this point.    Advised Pt would reach out to her PCP to see if they were able to assist with vertigo.  Pt does have history of labile blood pressures.  She states her aid checked her BP this morning.  She can't remember what it was but her aid said it was ok.  Spoke with Dr. Leldon office.  They will have nurse return call.

## 2024-05-29 DIAGNOSIS — Z09 Encounter for follow-up examination after completed treatment for conditions other than malignant neoplasm: Secondary | ICD-10-CM | POA: Insufficient documentation

## 2024-05-29 NOTE — Progress Notes (Unsigned)
   Established Patient Office Visit  Subjective   Patient ID: Cynthia Young, female    DOB: 08-Nov-1930  Age: 89 y.o. MRN: [id number]  No chief complaint on file.   HPI  Kelty Szafran presents for hospital follow-up.   Followed by cardiology  History of hypothyroidism, Mobitz 2 heart block, osteoporosis, mild cognitive impairment  She was seen 05/16/2024 by Dr. Harlene Copland-severe bradycardia HR 30s, sent to ER.  Dual-chamber permanent pacemaker PPM placed 05/17/2024., CXR 05/18/2024 within normal limits.  Presents with c/o Vertigo- given meclizine  prn 05/24/24     Patient denies fever, chills, SOB, CP, palpitations, dyspnea, edema, HA, vision changes, N/V/D, abdominal pain, urinary symptoms, rash, weight changes, and recent illness or hospitalizations.   ROS    Objective:     There were no vitals taken for this visit. {Vitals History (Optional):23777}  Physical Exam   No results found for any visits on 05/30/24.  {Labs (Optional):23779}  The ASCVD Risk score (Arnett DK, et al., 2019) failed to calculate for the following reasons:   The 2019 ASCVD risk score is only valid for ages 85 to 59    Assessment & Plan:   Problem List Items Addressed This Visit   None   No follow-ups on file.    Eevie Lapp L Arael Piccione, NP

## 2024-05-30 ENCOUNTER — Ambulatory Visit (INDEPENDENT_AMBULATORY_CARE_PROVIDER_SITE_OTHER): Admitting: Student

## 2024-05-30 ENCOUNTER — Encounter: Payer: Self-pay | Admitting: Student

## 2024-05-30 VITALS — BP 118/70 | Temp 98.2°F | Resp 17 | Ht 59.0 in | Wt 105.4 lb

## 2024-05-30 DIAGNOSIS — Z09 Encounter for follow-up examination after completed treatment for conditions other than malignant neoplasm: Secondary | ICD-10-CM

## 2024-05-30 DIAGNOSIS — R42 Dizziness and giddiness: Secondary | ICD-10-CM

## 2024-05-30 DIAGNOSIS — R531 Weakness: Secondary | ICD-10-CM

## 2024-05-30 NOTE — Patient Instructions (Signed)
   After Your Pacemaker   Monitor your pacemaker site for redness, swelling, and drainage. Call the device clinic at [phone] if you experience these symptoms or fever/chills.  Your incision was closed with Steri-strips or staples:  You may shower 7 days after your procedure and wash your incision with soap and water. Avoid lotions, ointments, or perfumes over your incision until it is well-healed.  You may use a hot tub or a pool after your wound check appointment if the incision is completely closed.  Do not lift, push or pull greater than 10 pounds with the affected arm until 6 weeks after your procedure. UNTIL AFTER AUGUST 14TH. There are no other restrictions in arm movement after your wound check appointment.  You may drive, unless driving has been restricted by your healthcare providers.  Remote monitoring is used to monitor your pacemaker from home. This monitoring is scheduled every 91 days by our office. It allows us  to keep an eye on the functioning of your device to ensure it is working properly. You will routinely see your Electrophysiologist annually (more often if necessary).

## 2024-05-31 ENCOUNTER — Ambulatory Visit: Attending: Cardiology

## 2024-05-31 ENCOUNTER — Telehealth: Payer: Self-pay

## 2024-05-31 DIAGNOSIS — I441 Atrioventricular block, second degree: Secondary | ICD-10-CM | POA: Diagnosis not present

## 2024-05-31 LAB — CUP PACEART INCLINIC DEVICE CHECK
Date Time Interrogation Session: 20250717131357
Implantable Lead Connection Status: 753985
Implantable Lead Connection Status: 753985
Implantable Lead Implant Date: 20250703
Implantable Lead Implant Date: 20250703
Implantable Lead Location: 753859
Implantable Lead Location: 753860
Implantable Lead Model: 3830
Implantable Lead Model: 5076
Implantable Pulse Generator Implant Date: 20250703
Lead Channel Pacing Threshold Amplitude: 0.75 V
Lead Channel Pacing Threshold Amplitude: 0.75 V
Lead Channel Pacing Threshold Pulse Width: 0.4 ms
Lead Channel Pacing Threshold Pulse Width: 0.4 ms

## 2024-05-31 NOTE — Progress Notes (Signed)
 Normal dual chamber pacemaker wound check. Presenting rhythm: AP/VP- 60 . Wound well healed- single stitch removed from the lateral aspect of the incision. Routine testing performed. Thresholds, sensing, and impedances consistent with implant measurements and at 3.5V safety margin/auto capture until 3 month visit. No episodes. Reviewed arm restrictions to continue for 6 weeks total post op.  Pt enrolled in remote follow-up.

## 2024-05-31 NOTE — Telephone Encounter (Signed)
 Copied from CRM [id number]. Topic: Clinical - Home Health Verbal Orders >> May 31, 2024 11:41 AM Burnard DEL wrote: Caller/Agency: [facility] Callback Number: [phone]  Any new concerns about the patient? Yes Called to report Report that patient will start PT on Monday July 21,2025 instead of today [id number] request by patient

## 2024-06-01 ENCOUNTER — Ambulatory Visit: Payer: Self-pay

## 2024-06-01 DIAGNOSIS — K449 Diaphragmatic hernia without obstruction or gangrene: Secondary | ICD-10-CM | POA: Diagnosis not present

## 2024-06-01 DIAGNOSIS — K219 Gastro-esophageal reflux disease without esophagitis: Secondary | ICD-10-CM | POA: Diagnosis not present

## 2024-06-01 DIAGNOSIS — G3184 Mild cognitive impairment, so stated: Secondary | ICD-10-CM | POA: Diagnosis not present

## 2024-06-01 DIAGNOSIS — E039 Hypothyroidism, unspecified: Secondary | ICD-10-CM | POA: Diagnosis not present

## 2024-06-01 DIAGNOSIS — E785 Hyperlipidemia, unspecified: Secondary | ICD-10-CM | POA: Diagnosis not present

## 2024-06-01 DIAGNOSIS — Z9181 History of falling: Secondary | ICD-10-CM | POA: Diagnosis not present

## 2024-06-01 DIAGNOSIS — Z48812 Encounter for surgical aftercare following surgery on the circulatory system: Secondary | ICD-10-CM | POA: Diagnosis not present

## 2024-06-01 DIAGNOSIS — M810 Age-related osteoporosis without current pathological fracture: Secondary | ICD-10-CM | POA: Diagnosis not present

## 2024-06-01 DIAGNOSIS — Z950 Presence of cardiac pacemaker: Secondary | ICD-10-CM | POA: Diagnosis not present

## 2024-06-01 DIAGNOSIS — I441 Atrioventricular block, second degree: Secondary | ICD-10-CM | POA: Diagnosis not present

## 2024-06-01 NOTE — Telephone Encounter (Signed)
 States that she has not been taking the Antivert  but the PT gave her one today and since she took it she has been having dizziness and headache and fatigue.  Instructed ED. Offered to call EMS, Pt refused.States that she will call her daughter.

## 2024-06-01 NOTE — Telephone Encounter (Signed)
 FYI Only or Action Required?: FYI only for provider.  Patient was last seen in primary care on 05/30/2024 by Wheeler Harlene CROME, NP.  Called Nurse Triage reporting Headache.  Symptoms began today.  Interventions attempted: Rest, hydration, or home remedies.  Symptoms are: unchanged.  Triage Disposition: Go to ED Now (Notify PCP)  Patient/caregiver understands and will follow disposition?: Yes      Copied from CRM [id number]. Topic: Clinical - Red Word Triage >> Jun 01, 2024  2:30 PM [place] J wrote: [facility] that prompted transfer to Nurse Triage: Patient has a tremendous headache and excessive fatigue. She believes its due to a medication she was prescribed.   ----------------------------------------------------------------------- From previous Reason for Contact - Other: Reason for CRM: Patient has a tremendous headache and excessive fatigue. She believes its due to a medication she was prescribed. Reason for Disposition  SEVERE headache (e.g., excruciating)  (Exception: Similar to previous migraines.)  Answer Assessment - Initial Assessment Questions 1. DESCRIPTION: Describe your dizziness.     woozy 2. VERTIGO: Do you feel like either you or the room is spinning or tilting?      Earlier but not anymore 3. LIGHTHEADED: Do you feel lightheaded? (e.g., somewhat faint, woozy, weak upon standing)     woozy 4. SEVERITY: How bad is it?  Can you walk?     Mod- but can walk 5. ONSET:  When did the dizziness begin?     today 6. AGGRAVATING FACTORS: Does anything make it worse? (e.g., standing, change in head position)     Walking and standing 7. CAUSE: What do you think is causing the dizziness?     Antivert  8. RECURRENT SYMPTOM: Have you had dizziness before? If Yes, ask: When was the last time? What happened that time?      9. OTHER SYMPTOMS: Do you have any other symptoms? (e.g., earache, headache, numbness, tinnitus, vomiting, weakness)      Headache, fatigue, and elevated BP 155/something  Protocols used: Dizziness - Vertigo-A-AH

## 2024-06-04 ENCOUNTER — Telehealth: Payer: Self-pay | Admitting: Family Medicine

## 2024-06-04 NOTE — Telephone Encounter (Signed)
 Copied from CRM [id number]. Topic: Referral - Prior Authorization Question >> Jun 04, 2024 12:25 PM Jasmin G wrote: Reason for CRM: Mrs. Cynthia Young from [employer] is requesting home health pt for once a week for 9 weeks. Please call her back at: [phone].

## 2024-06-04 NOTE — Telephone Encounter (Signed)
 Spoke w/ Claud- verbal orders given.

## 2024-06-07 DIAGNOSIS — E785 Hyperlipidemia, unspecified: Secondary | ICD-10-CM | POA: Diagnosis not present

## 2024-06-07 DIAGNOSIS — Z48812 Encounter for surgical aftercare following surgery on the circulatory system: Secondary | ICD-10-CM | POA: Diagnosis not present

## 2024-06-07 DIAGNOSIS — K219 Gastro-esophageal reflux disease without esophagitis: Secondary | ICD-10-CM | POA: Diagnosis not present

## 2024-06-07 DIAGNOSIS — K449 Diaphragmatic hernia without obstruction or gangrene: Secondary | ICD-10-CM | POA: Diagnosis not present

## 2024-06-07 DIAGNOSIS — G3184 Mild cognitive impairment, so stated: Secondary | ICD-10-CM | POA: Diagnosis not present

## 2024-06-07 DIAGNOSIS — I441 Atrioventricular block, second degree: Secondary | ICD-10-CM | POA: Diagnosis not present

## 2024-06-08 ENCOUNTER — Other Ambulatory Visit: Payer: Self-pay

## 2024-06-08 ENCOUNTER — Encounter (HOSPITAL_BASED_OUTPATIENT_CLINIC_OR_DEPARTMENT_OTHER): Payer: Self-pay

## 2024-06-08 ENCOUNTER — Emergency Department (HOSPITAL_BASED_OUTPATIENT_CLINIC_OR_DEPARTMENT_OTHER)

## 2024-06-08 ENCOUNTER — Emergency Department (HOSPITAL_BASED_OUTPATIENT_CLINIC_OR_DEPARTMENT_OTHER)
Admission: EM | Admit: 2024-06-08 | Discharge: 2024-06-08 | Disposition: A | Discharge status: Home / Self Care | Attending: Emergency Medicine | Admitting: Emergency Medicine

## 2024-06-08 DIAGNOSIS — M25532 Pain in left wrist: Secondary | ICD-10-CM | POA: Diagnosis not present

## 2024-06-08 DIAGNOSIS — X501XXA Overexertion from prolonged static or awkward postures, initial encounter: Secondary | ICD-10-CM | POA: Insufficient documentation

## 2024-06-08 DIAGNOSIS — Z79899 Other long term (current) drug therapy: Secondary | ICD-10-CM | POA: Insufficient documentation

## 2024-06-08 DIAGNOSIS — E039 Hypothyroidism, unspecified: Secondary | ICD-10-CM | POA: Diagnosis not present

## 2024-06-08 DIAGNOSIS — S29002A Unspecified injury of muscle and tendon of back wall of thorax, initial encounter: Secondary | ICD-10-CM | POA: Diagnosis present

## 2024-06-08 DIAGNOSIS — S22088A Other fracture of T11-T12 vertebra, initial encounter for closed fracture: Secondary | ICD-10-CM | POA: Diagnosis not present

## 2024-06-08 DIAGNOSIS — R079 Chest pain, unspecified: Secondary | ICD-10-CM | POA: Diagnosis not present

## 2024-06-08 DIAGNOSIS — S22080A Wedge compression fracture of T11-T12 vertebra, initial encounter for closed fracture: Secondary | ICD-10-CM

## 2024-06-08 DIAGNOSIS — W19XXXA Unspecified fall, initial encounter: Secondary | ICD-10-CM

## 2024-06-08 DIAGNOSIS — Z950 Presence of cardiac pacemaker: Secondary | ICD-10-CM | POA: Diagnosis not present

## 2024-06-08 DIAGNOSIS — I700 Atherosclerosis of aorta: Secondary | ICD-10-CM | POA: Diagnosis not present

## 2024-06-08 DIAGNOSIS — I517 Cardiomegaly: Secondary | ICD-10-CM | POA: Diagnosis not present

## 2024-06-08 HISTORY — DX: Dizziness and giddiness: R42

## 2024-06-08 HISTORY — DX: Presence of cardiac pacemaker: Z95.0

## 2024-06-08 MED ORDER — LIDOCAINE 5 % EX PTCH
1.0000 | MEDICATED_PATCH | CUTANEOUS | Status: DC
  Administered 2024-06-08: 1 via TRANSDERMAL
  Filled 2024-06-08: qty 1

## 2024-06-08 NOTE — ED Triage Notes (Addendum)
 Pt states that she was in the back yard and was startled by a lizard and then she fell. States that she twisted and hurt her back. Denies LOC.  Denies blood thinners. Pt states that she is also dizzy. States that she was just in the hospital and had a pacemaker inserted a few weeks ago. States that she took 2 Tylenol  prior to arrival.

## 2024-06-08 NOTE — ED Provider Notes (Signed)
 [facility] Provider Note   CSN: [id number] Arrival date & time: 06/08/24  1736     Patient presents with: Cynthia Young is a 89 y.o. female.   Pt is a 89y/o female with hx of hypothyroidism,  osteoporosis, mild cognitive impairment, recent pacemaker placement for severe bradycardia who is presenting today after a fall.  Patient reports that she was out in her patio without her walker picking up a bag underneath one of her flowerpots when the lizard ran out and startled her.  She had the door open and she was trying to move quickly to shut the door so the lizard would not get in her house causing her to twist and fall to her left side.  She reports pain in the left wrist and pain that wraps around her chest and back.  Initially she reports the pain took her breath away but she was eventually able to get up and go back inside.  Her daughter reports she has had a hard time getting up from a seated or lying position.  She initially did feel little short of breath but reports that has resolved.  She did take some pain medication before she got here and thinks that is helping some.  She denies any abdominal pain, hip or leg pain.  She did not hit her head or lose consciousness.  Daughter reports she had a similar fall several weeks ago and it just took 2 weeks to improve but she had imaging which was all negative.  The history is provided by the patient, medical records and a relative.  Fall       Prior to Admission medications   Medication Sig Start Date End Date Taking? Authorizing Provider  acetaminophen  (TYLENOL ) 500 MG tablet Take 500-1,000 mg by mouth every 6 (six) hours as needed for mild pain (pain score 1-3) or moderate pain (pain score 4-6).    [provider]  famotidine  (PEPCID ) 20 MG tablet Take 1 tablet (20 mg total) by mouth daily. Patient not taking: Reported on 05/30/2024 07/27/22   Kerman Vina HERO, NP   levothyroxine  (SYNTHROID ) 50 MCG tablet TAKE ONE TABLET BY MOUTH ONE TIME DAILY 04/06/24   Domenica Harlene LABOR, MD  Magnesium  250 MG TABS Take 1 tablet (250 mg total) by mouth 2 (two) times daily. 06/09/23   Domenica Harlene LABOR, MD  meclizine  (ANTIVERT ) 12.5 MG tablet Take 1 tablet (12.5 mg total) by mouth 3 (three) times daily as needed for dizziness. Patient not taking: Reported on 05/30/2024 05/24/24   Domenica Harlene LABOR, MD  omeprazole  (PRILOSEC OTC) 20 MG tablet Take 20 mg by mouth daily.    [provider]    Allergies: Tylenol  [acetaminophen ]    Review of Systems  Updated Vital Signs BP 139/73   Pulse 60   Temp 98.3 F (36.8 C)   Resp 20   Ht 4' 11 (1.499 m)   Wt 45.4 kg   SpO2 94%   BMI 20.20 kg/m   Physical Exam Vitals and nursing note reviewed.  Constitutional:      General: She is not in acute distress.    Appearance: She is well-developed.  HENT:     Head: Normocephalic and atraumatic.  Eyes:     Pupils: Pupils are equal, round, and reactive to light.  Cardiovascular:     Rate and Rhythm: Normal rate and regular rhythm.     Pulses: Normal pulses.  Heart sounds: Normal heart sounds. No murmur heard.    No friction rub.  Pulmonary:     Effort: Pulmonary effort is normal.     Breath sounds: Normal breath sounds. No wheezing or rales.     Comments: No reproducible focal rib tenderness. Chest:     Chest wall: No tenderness.  Abdominal:     General: Bowel sounds are normal. There is no distension.     Palpations: Abdomen is soft.     Tenderness: There is no abdominal tenderness. There is no guarding or rebound.  Musculoskeletal:        General: Tenderness present. Normal range of motion.     Left wrist: Swelling and tenderness present. No snuff box tenderness. Normal range of motion.     Comments: No edema.  No midline thoracic or lumbar tenderness but she reports a deep pain through the lower thoracic region.  Able to move bilateral lower extremities without  any difficulty or pain  Skin:    General: Skin is warm and dry.     Findings: No rash.  Neurological:     Mental Status: She is alert and oriented to person, place, and time. Mental status is at baseline.     Sensory: No sensory deficit.     Motor: No weakness.  Psychiatric:        Behavior: Behavior normal.     (all labs ordered are listed, but only abnormal results are displayed) Labs Reviewed - No data to display  EKG: None  Radiology: DG Wrist Complete Left Result Date: 06/08/2024 CLINICAL DATA:  Pain after fall EXAM: LEFT WRIST - COMPLETE 3+ VIEW COMPARISON:  None Available. FINDINGS: There is soft tissue swelling surrounding the wrist. No acute fracture or dislocation identified. There is radiocarpal joint space narrowing and joint space narrowing of the first carpometacarpal joint compatible with degenerative change. IMPRESSION: Soft tissue swelling surrounding the wrist. No acute fracture or dislocation identified. Electronically Signed   By: Greig Pique M.D.   On: 06/08/2024 19:06   DG Chest 2 View Result Date: 06/08/2024 CLINICAL DATA:  Pain after fall EXAM: CHEST - 2 VIEW COMPARISON:  05/18/2024 FINDINGS: Two lead pacer. Atherosclerotic calcification of the aortic arch. Mild cardiomegaly. Suspected new 25% compression fracture at T11. Remote 50% T12 compression fracture. L2 compression fracture, age indeterminate. IMPRESSION: 1. Suspected new 25% compression fracture at T11. 2. Remote 50% T12 compression fracture. 3. L2 compression fracture, age indeterminate. 4. Mild cardiomegaly. 5. Two lead pacer. Electronically Signed   By: Ryan Salvage M.D.   On: 06/08/2024 19:04   DG Thoracic Spine 2 View Result Date: 06/08/2024 CLINICAL DATA:  Fall, back pain EXAM: THORACIC SPINE 2 VIEWS COMPARISON:  07/13/2022 FINDINGS: Two lead pacer noted. Atherosclerotic calcification of the aortic arch. Chronic 50% wedge compression fracture at T12. Suspected interval mild superior endplate  compression fracture at T11 and probably L2 is well, age indeterminate. Lower thoracic and upper lumbar region difficult to characterize on the lateral projection due to obliquity. Loss of disc height and endplate sclerosis C5-6 on the swimmer's view. IMPRESSION: 1. Suspected interval mild superior endplate compression fracture at T11 and probably L2, age indeterminate. 2. Chronic 50% wedge compression fracture at T12. 3. Aortic Atherosclerosis (ICD10-I70.0). Electronically Signed   By: Ryan Salvage M.D.   On: 06/08/2024 19:02     Procedures   Medications Ordered in the ED  lidocaine  (LIDODERM ) 5 % 1 patch (1 patch Transdermal Patch Applied 06/08/24 1955)  Medical Decision Making Amount and/or Complexity of Data Reviewed Radiology: ordered and independent interpretation performed. Decision-making details documented in ED Course.   Pt with multiple medical problems and comorbidities and presenting today with a complaint that caries a high risk for morbidity and mortality.  Here today after a fall with pain that is a bandlike sensation around the chest and back in the lower thoracic region.  She denies any shortness of breath at this time but reports when it did happen it took her breath away.  No head injury or loss of consciousness.  Patient did take pain control prior to arrival and thinks that it might be helping.  She has no findings concerning for hip or lower extremity fracture.  Her left wrist is tender and swollen concern for possible fracture.  Also will do a chest x-ray and thoracic films to ensure no evidence of rib fracture or thoracic compression fracture.  She is neurologically intact at this time.  I have independently visualized and interpreted pt's images today.  Wrist image without evidence of acute fracture.  Radiology reports soft tissue swelling only.  For thoracic images radiology reports suspected compression fracture at T11 and chest  x-ray also with suspected new 25% compression fracture at T11 with a remote compression fracture at T12.  Pacemaker appears stable.  Endings discussed with the patient and her family.  At this time she is stable for discharge home.       Final diagnoses:  Fall, initial encounter  Compression fracture of T11 vertebra, initial encounter [facility])    ED Discharge Orders     None          Doretha Folks, MD 06/08/24 [phone]

## 2024-06-08 NOTE — Discharge Instructions (Signed)
 No signs of anything broken in your wrist and there was a small compression fracture at T11 that they suspect is new.  No broken ribs and everything with your pacemaker looks okay.  You can use the Lidoderm  patch in the area of your back but you can also get diclofenac gel or also called Voltaren gel and rub it in the area that is uncomfortable for you.  You can also take the Tylenol  every 6 hours

## 2024-06-09 ENCOUNTER — Ambulatory Visit: Payer: Self-pay | Admitting: Cardiovascular Disease

## 2024-06-12 ENCOUNTER — Ambulatory Visit: Payer: Self-pay

## 2024-06-12 NOTE — Telephone Encounter (Signed)
 FYI Only or Action Required?: FYI only for provider.  Patient was last seen in primary care on 05/30/2024 by Wheeler Harlene CROME, NP.  Called Nurse Triage reporting Constipation.  Symptoms began 3-4 days ago.  Interventions attempted: OTC medications: Miralax , Milk of Magnesia.  Symptoms are: gradually worsening.  Triage Disposition: See HCP Within 4 Hours (Or PCP Triage)  Patient/caregiver understands and will follow disposition?: Yes  No appointments available for 2 days. Patient advised that with her constant abdominal pain she should go to urgent care or the ED for evaluation. Patient verbalized understanding and agreement with this plan.      Summary: Constipation   Constipation for 3 days. Has taken miralax  and prunes and Milk of Magnesia. She would like to know if she should continue to take Milk of Magnesia or miralax . She also has a stomach ache  Callback #: [id number]  Preferred Pharmacy: [facility] [address] - [place], [place] - [address] [place] [address] Phone: [phone] Fax: [phone] Hours: Not open 24 hours        Reason for Disposition  [1] Constant abdominal pain AND [2] present > 2 hours  Answer Assessment - Initial Assessment Questions 1. STOOL PATTERN OR FREQUENCY: How often do you have a bowel movement (BM)?  (Normal range: 3 times a day to every 3 days)  When was your last BM?       Last bowel movement 3 days ago, usually once a day  2. STRAINING: Do you have to strain to have a BM?      Not normally 3. ONSET: When did the constipation begin?     3 days ago 4. RECTAL PAIN: Does your rectum hurt when the stool comes out? If Yes, ask: Do you have hemorrhoids? How bad is the pain?  (Scale 1-10; or mild, moderate, severe)     No 5. BM COMPOSITION: Are the stools hard?      N/A 6. BLOOD ON STOOLS: Has there been any blood on the toilet  tissue or on the surface of the BM? If Yes, ask: When was the last time?     N/A 7. CHRONIC CONSTIPATION: Is this a new problem for you?  If No, ask: How long have you had this problem? (days, weeks, months)      No history of chronic constipation  8. CHANGES IN DIET OR HYDRATION: Have there been any recent changes in your diet? How much fluids are you drinking on a daily basis?  How much have you had to drink today?     Has been drinking more water 9. MEDICINES: Have you been taking any new medicines? Are you taking any narcotic pain medicines? (e.g., Dilaudid , morphine , Percocet, Vicodin)     No 10. LAXATIVES: Have you been using any stool softeners, laxatives, or enemas?  If Yes, ask What are you using, how often, and when was the last time?       Miralax  and Milk of Magnesia without relief  12. CAUSE: What do you think is causing the constipation?        Unsure 13. MEDICAL HISTORY: Do you have a history of hemorrhoids, rectal fissures, rectal surgery, or rectal abscess?         No 14. OTHER SYMPTOMS: Do you have any other symptoms? (e.g., abdomen pain, bloating, fever, vomiting)       Constant abdominal pain  Protocols used: Constipation-A-AH

## 2024-06-19 DIAGNOSIS — E785 Hyperlipidemia, unspecified: Secondary | ICD-10-CM | POA: Diagnosis not present

## 2024-06-19 DIAGNOSIS — K219 Gastro-esophageal reflux disease without esophagitis: Secondary | ICD-10-CM | POA: Diagnosis not present

## 2024-06-19 DIAGNOSIS — G3184 Mild cognitive impairment, so stated: Secondary | ICD-10-CM | POA: Diagnosis not present

## 2024-06-19 DIAGNOSIS — K449 Diaphragmatic hernia without obstruction or gangrene: Secondary | ICD-10-CM | POA: Diagnosis not present

## 2024-06-19 DIAGNOSIS — I441 Atrioventricular block, second degree: Secondary | ICD-10-CM | POA: Diagnosis not present

## 2024-06-19 DIAGNOSIS — Z48812 Encounter for surgical aftercare following surgery on the circulatory system: Secondary | ICD-10-CM | POA: Diagnosis not present

## 2024-06-20 ENCOUNTER — Telehealth: Payer: Self-pay

## 2024-06-20 DIAGNOSIS — R1010 Upper abdominal pain, unspecified: Secondary | ICD-10-CM

## 2024-06-20 NOTE — Telephone Encounter (Signed)
 Copied from CRM [id number]. Topic: Referral - Request for Referral >> Jun 20, 2024  3:21 PM Franky GRADE wrote: Did the patient discuss referral with their provider in the last year? Yes (If No - schedule appointment) (If Yes - send message)  Appointment offered? Yes, patient has an appointment on 07/02/2024  Type of order/referral and detailed reason for visit: Gastro, due to a pain in the upper part of her abdomen that has been lingering since her fall.   Preference of office, provider, location: Anywhere Dr.Blyth recommends.   If referral order, have you been seen by this specialty before? No (If Yes, this issue or another issue? When? Where?  Can we respond through MyChart? Yes

## 2024-06-22 ENCOUNTER — Telehealth: Payer: Self-pay | Admitting: Family Medicine

## 2024-06-22 NOTE — Telephone Encounter (Signed)
 Copied from CRM [id number]. Topic: General - Other >> Jun 22, 2024  3:51 PM Martinique E wrote: Reason for CRM: Patient called in wanting to let Waddell Mon know that she is the one that put in an order for patient to receive a rolling walker. Patient stated she got the questions about who ordered this for her. Callback number for patient is [phone].

## 2024-06-22 NOTE — Addendum Note (Signed)
 Addended by: Adil Tugwell C on: 06/22/2024 09:13 AM   Modules accepted: Orders

## 2024-06-22 NOTE — Telephone Encounter (Addendum)
 Referral was placed per Padonda Webb,FNP and patient was advised.

## 2024-06-27 NOTE — Telephone Encounter (Signed)
 Spoke with patient. She was confused about process to get walker. Advised she would take RX (thinks she already has) to a medical supply store. She will contact us  back if she needs a new script.

## 2024-06-28 DIAGNOSIS — G3184 Mild cognitive impairment, so stated: Secondary | ICD-10-CM | POA: Diagnosis not present

## 2024-06-28 DIAGNOSIS — Z48812 Encounter for surgical aftercare following surgery on the circulatory system: Secondary | ICD-10-CM | POA: Diagnosis not present

## 2024-06-28 DIAGNOSIS — I441 Atrioventricular block, second degree: Secondary | ICD-10-CM | POA: Diagnosis not present

## 2024-06-28 DIAGNOSIS — K219 Gastro-esophageal reflux disease without esophagitis: Secondary | ICD-10-CM | POA: Diagnosis not present

## 2024-06-28 DIAGNOSIS — E785 Hyperlipidemia, unspecified: Secondary | ICD-10-CM | POA: Diagnosis not present

## 2024-06-28 DIAGNOSIS — K449 Diaphragmatic hernia without obstruction or gangrene: Secondary | ICD-10-CM | POA: Diagnosis not present

## 2024-07-01 DIAGNOSIS — E785 Hyperlipidemia, unspecified: Secondary | ICD-10-CM | POA: Diagnosis not present

## 2024-07-01 DIAGNOSIS — Z48812 Encounter for surgical aftercare following surgery on the circulatory system: Secondary | ICD-10-CM | POA: Diagnosis not present

## 2024-07-01 DIAGNOSIS — K449 Diaphragmatic hernia without obstruction or gangrene: Secondary | ICD-10-CM | POA: Diagnosis not present

## 2024-07-01 DIAGNOSIS — G3184 Mild cognitive impairment, so stated: Secondary | ICD-10-CM | POA: Diagnosis not present

## 2024-07-01 DIAGNOSIS — K219 Gastro-esophageal reflux disease without esophagitis: Secondary | ICD-10-CM | POA: Diagnosis not present

## 2024-07-01 DIAGNOSIS — M810 Age-related osteoporosis without current pathological fracture: Secondary | ICD-10-CM | POA: Diagnosis not present

## 2024-07-01 DIAGNOSIS — Z950 Presence of cardiac pacemaker: Secondary | ICD-10-CM | POA: Diagnosis not present

## 2024-07-01 DIAGNOSIS — I441 Atrioventricular block, second degree: Secondary | ICD-10-CM | POA: Diagnosis not present

## 2024-07-01 DIAGNOSIS — Z9181 History of falling: Secondary | ICD-10-CM | POA: Diagnosis not present

## 2024-07-01 DIAGNOSIS — E039 Hypothyroidism, unspecified: Secondary | ICD-10-CM | POA: Diagnosis not present

## 2024-07-01 NOTE — Assessment & Plan Note (Signed)
 Encouraged to get adequate exercise, calcium and vitamin d intake

## 2024-07-01 NOTE — Assessment & Plan Note (Signed)
 hgba1c acceptable, minimize simple carbs. Increase exercise as tolerated.

## 2024-07-01 NOTE — Assessment & Plan Note (Signed)
 Encourage heart healthy diet such as MIND or DASH diet, increase exercise, avoid trans fats, simple carbohydrates and processed foods, consider a krill or fish or flaxseed oil cap daily.

## 2024-07-01 NOTE — Assessment & Plan Note (Signed)
 On Levothyroxine, continue to monitor

## 2024-07-02 ENCOUNTER — Ambulatory Visit (INDEPENDENT_AMBULATORY_CARE_PROVIDER_SITE_OTHER): Admitting: Family Medicine

## 2024-07-02 VITALS — BP 106/68 | HR 67 | Temp 98.2°F | Resp 12 | Ht 59.0 in | Wt 102.4 lb

## 2024-07-02 DIAGNOSIS — M810 Age-related osteoporosis without current pathological fracture: Secondary | ICD-10-CM

## 2024-07-02 DIAGNOSIS — E785 Hyperlipidemia, unspecified: Secondary | ICD-10-CM

## 2024-07-02 DIAGNOSIS — E039 Hypothyroidism, unspecified: Secondary | ICD-10-CM

## 2024-07-02 DIAGNOSIS — R739 Hyperglycemia, unspecified: Secondary | ICD-10-CM

## 2024-07-02 NOTE — Patient Instructions (Addendum)
 Encouraged increased hydration and fiber in diet. Daily probiotics. If bowels not moving can use Milk Of Magnesium   Whey powder, yellow split pea powder, collagen powder, egg white power, chia seeds, nuts  CBD Daily mint triple strength, [employer] website  Hiatal Hernia  A hiatal hernia occurs when part of the stomach slides above the muscle that separates the abdomen from the chest (diaphragm). A person can be born with a hiatal hernia (congenital), or it may develop over time. In almost all cases of hiatal hernia, only the top part of the stomach pushes through the diaphragm. Many people have a hiatal hernia with no symptoms. The larger the hernia, the more likely it is that you will have symptoms. In some cases, a hiatal hernia allows stomach acid to flow back into the tube that carries food from your mouth to your stomach (esophagus). This may cause heartburn symptoms. The development of heartburn symptoms may mean that you have a condition called gastroesophageal reflux disease (GERD). What are the causes? This condition is caused by a weakness in the opening (hiatus) where the esophagus passes through the diaphragm to attach to the upper part of the stomach. A person may be born with a weakness in the hiatus, or a weakness can develop over time. What increases the risk? This condition is more likely to develop in: Older people. Age is a major risk factor for a hiatal hernia, especially if you are over the age of 61. Pregnant women. People who are overweight. People who have frequent constipation. What are the signs or symptoms? Symptoms of this condition usually develop in the form of GERD symptoms. Symptoms include: Heartburn. Upset stomach (indigestion). Trouble swallowing. Coughing or wheezing. Wheezing is making high-pitched whistling sounds when you breathe. Sore throat. Chest pain. Nausea and vomiting. How is this diagnosed? This condition may be diagnosed during testing for  GERD. Tests that may be done include: X-rays of your stomach or chest. An upper gastrointestinal (GI) series. This is an X-ray exam of your GI tract that is taken after you swallow a chalky liquid that shows up clearly on the X-ray. Endoscopy. This is a procedure to look into your stomach using a thin, flexible tube that has a tiny camera and light on the end of it. How is this treated? This condition may be treated by: Dietary and lifestyle changes to help reduce GERD symptoms. Medicines. These may include: Over-the-counter antacids. Medicines that make your stomach empty more quickly. Medicines that block the production of stomach acid (H2 blockers). Stronger medicines to reduce stomach acid (proton pump inhibitors). Surgery to repair the hernia, if other treatments are not helping. If you have no symptoms, you may not need treatment. Follow these instructions at home: Lifestyle and activity Do not use any products that contain nicotine or tobacco. These products include cigarettes, chewing tobacco, and vaping devices, such as e-cigarettes. If you need help quitting, ask your health care provider. Try to achieve and maintain a healthy body weight. Avoid putting pressure on your abdomen. Anything that puts pressure on your abdomen increases the amount of acid that may be pushed up into your esophagus. Avoid bending over, especially after eating. Raise the head of your bed by putting blocks under the legs. This keeps your head and esophagus higher than your stomach. Do not wear tight clothing around your chest or stomach. Try not to strain when having a bowel movement, when urinating, or when lifting heavy objects. Eating and drinking Avoid foods that can  worsen GERD symptoms. These may include: Fatty foods, like fried foods. Citrus fruits, like oranges or lemon. Other foods and drinks that contain acid, like orange juice or tomatoes. Spicy food. Chocolate. Eat frequent small meals  instead of three large meals a day. This helps prevent your stomach from getting too full. Eat slowly. Do not lie down right after eating. Do not eat 1-2 hours before bed. Do not drink beverages with caffeine. These include cola, coffee, cocoa, and tea. Do not drink alcohol. General instructions Take over-the-counter and prescription medicines only as told by your health care provider. Keep all follow-up visits. Your health care provider will want to check that any new prescribed medicines are helping your symptoms. Contact a health care provider if: Your symptoms are not controlled with medicines or lifestyle changes. You are having trouble swallowing. You have coughing or wheezing that will not go away. Your pain is getting worse. Your pain spreads to your arms, neck, jaw, teeth, or back. You feel nauseous or you vomit. Get help right away if: You have shortness of breath. You vomit blood. You have bright red blood in your stools. You have black, tarry stools. These symptoms may be an emergency. Get help right away. Call 911. Do not wait to see if the symptoms will go away. Do not drive yourself to the hospital. Summary A hiatal hernia occurs when part of the stomach slides above the muscle that separates the abdomen from the chest. A person may be born with a weakness in the hiatus, or a weakness can develop over time. Symptoms of a hiatal hernia may include heartburn, trouble swallowing, or sore throat. Management of a hiatal hernia includes eating frequent small meals instead of three large meals a day. Get help right away if you vomit blood, have bright red blood in your stools, or have black, tarry stools. This information is not intended to replace advice given to you by your health care provider. Make sure you discuss any questions you have with your health care provider. Document Revised: 12/29/2021 Document Reviewed: 12/29/2021 Elsevier Patient Education  2024 Elsevier  Inc. 2 tbls po in 4 oz of warm prune juice by mouth every 2-3 days. If no results then repeat in 4 hours with  Dulcolax suppository pr, may repeat again in 4 more hours as needed. Seek care if symptoms worsen. Consider daily Mitral and/or Dulcolax if symptoms persist.    Tru Lemon, Tru Orange, Tru Lime, Tru Grapefruit   Mylanta if heartburn persists  Add Pepcid  at bedtime, 20 mg

## 2024-07-03 ENCOUNTER — Encounter: Payer: Self-pay | Admitting: Family Medicine

## 2024-07-03 ENCOUNTER — Ambulatory Visit (INDEPENDENT_AMBULATORY_CARE_PROVIDER_SITE_OTHER)

## 2024-07-03 DIAGNOSIS — I441 Atrioventricular block, second degree: Secondary | ICD-10-CM

## 2024-07-03 NOTE — Progress Notes (Signed)
 Subjective:    Patient ID: Cynthia Young, female    DOB: 10/12/1930, 89 y.o.   MRN: [id number]  Chief Complaint  Patient presents with  . Follow-up    HPI Discussed the use of AI scribe software for clinical note transcription with the patient, who gave verbal consent to proceed.  History of Present Illness Cynthia Young is a 89 year old female with vertebral fractures and GERD who presents with worsening GERD symptoms and back pain.  She experiences significant back pain due to fractures in her T11 and T12 vertebrae, which occurred after two falls. The pain is severe and persistent, impacting her daily activities.  She has a history of a pacemaker placement, which initially caused dizziness and vertigo-like symptoms. The dizziness is described as a spinning sensation similar to being on a carnival ride.  She is experiencing worsening symptoms of GERD and a hiatal hernia, with her stomach reportedly moving up through her diaphragm. She describes daily heartburn and pain after eating, which has become more frequent and severe, affecting her ability to enjoy meals. She has tried lifestyle modifications such as walking after meals and sleeping in a recliner to alleviate symptoms.  She reports constipation and uses Miralax  daily. She recently took a laxative and experiences frequent urination with minimal output, which she attributes to her hydration efforts.  She mentions a history of hallucinations, which she relates to her mother-in-law's experiences with seizures.  She is concerned about her protein intake, as she does not eat much meat and her recent blood work showed low protein levels. She consumes eggs, cheese, and cottage cheese regularly.    Past Medical History:  Diagnosis Date  . Arthritis   . Hyperglycemia 09/04/2020  . Hyperlipidemia   . Hypothyroid   . Pacemaker   . Vertigo     Past Surgical History:  Procedure Laterality Date  . BACK SURGERY    . BIOPSY   04/12/2020   Procedure: BIOPSY;  Surgeon: Wilhelmenia Aloha Raddle., MD;  Location: [facility];  Service: Gastroenterology;;  . ROMAYNE Left   . CHOLECYSTECTOMY N/A 04/11/2020   Procedure: LAPAROSCOPIC CHOLECYSTECTOMY WITH INTRAOPERATIVE CHOLANGIOGRAM;  Surgeon: Ethyl Lenis, MD;  Location: [facility];  Service: General;  Laterality: N/A;  . ERCP N/A 04/12/2020   Procedure: ENDOSCOPIC RETROGRADE CHOLANGIOPANCREATOGRAPHY (ERCP);  Surgeon: Wilhelmenia Aloha Raddle., MD;  Location: [facility];  Service: Gastroenterology;  Laterality: N/A;  . FOOT SURGERY Right    bunion  . PACEMAKER IMPLANT N/A 05/17/2024   Procedure: PACEMAKER IMPLANT;  Surgeon: Nancey Eulas BRAVO, MD;  Location: [facility];  Service: Cardiovascular;  Laterality: N/A;  . REMOVAL OF STONES  04/12/2020   Procedure: REMOVAL OF STONES;  Surgeon: Wilhelmenia Aloha Raddle., MD;  Location: [facility];  Service: Gastroenterology;;  . ANNETT  04/12/2020   Procedure: ANNETT;  Surgeon: Wilhelmenia Aloha Raddle., MD;  Location: [facility];  Service: Gastroenterology;;    Family History  Problem Relation Age of Onset  . Cancer Mother   . Heart disease Father   . Arthritis Father        rheumatoid  . Cancer Brother        colon  . Colon cancer Brother   . Esophageal cancer Neg Hx   . Inflammatory bowel disease Neg Hx   . Liver disease Neg Hx   . Pancreatic cancer Neg Hx   . Stomach cancer Neg Hx     Social History   Socioeconomic History  . Marital status: Widowed  Spouse name: Not on file  . Number of children: Not on file  . Years of education: Not on file  . Highest education level: Not on file  Occupational History  . Not on file  Tobacco Use  . Smoking status: Never  . Smokeless tobacco: Never  Vaping Use  . Vaping status: Never Used  Substance and Sexual Activity  . Alcohol use: Yes    Comment: socially  . Drug use: Never  . Sexual activity: Not Currently  Other Topics Concern  . Not  on file  Social History Narrative   worked in [employer], short time. No cigarettes or drug use. Live by self no dietary restictions and walks daily   Social Drivers of Health   Financial Resource Strain: Low Risk  (09/23/2023)   Overall Financial Resource Strain (CARDIA)   . Difficulty of Paying Living Expenses: Not hard at all  Food Insecurity: No Food Insecurity (05/21/2024)   Hunger Vital Sign   . Worried About Programme researcher, broadcasting/film/video in the Last Year: Never true   . Ran Out of Food in the Last Year: Never true  Transportation Needs: No Transportation Needs (05/21/2024)   PRAPARE - Transportation   . Lack of Transportation (Medical): No   . Lack of Transportation (Non-Medical): No  Physical Activity: Inactive (09/23/2023)   Exercise Vital Sign   . Days of Exercise per Week: 0 days   . Minutes of Exercise per Session: 0 min  Stress: No Stress Concern Present (09/23/2023)   [employer] of Occupational Health - Occupational Stress Questionnaire   . Feeling of Stress : Not at all  Social Connections: Moderately Integrated (05/17/2024)   Social Connection and Isolation Panel   . Frequency of Communication with Friends and Family: More than three times a week   . Frequency of Social Gatherings with Friends and Family: More than three times a week   . Attends Religious Services: More than 4 times per year   . Active Member of Clubs or Organizations: Yes   . Attends Banker Meetings: 1 to 4 times per year   . Marital Status: Widowed  Intimate Partner Violence: Not At Risk (05/21/2024)   Humiliation, Afraid, Rape, and Kick questionnaire   . Fear of Current or Ex-Partner: No   . Emotionally Abused: No   . Physically Abused: No   . Sexually Abused: No    Outpatient Medications Prior to Visit  Medication Sig Dispense Refill  . acetaminophen  (TYLENOL ) 500 MG tablet Take 500-1,000 mg by mouth every 6 (six) hours as needed for mild pain (pain score 1-3) or moderate pain (pain  score 4-6).    . levothyroxine  (SYNTHROID ) 50 MCG tablet TAKE ONE TABLET BY MOUTH ONE TIME DAILY 90 tablet 1  . Magnesium  250 MG TABS Take 1 tablet (250 mg total) by mouth 2 (two) times daily.    . omeprazole  (PRILOSEC OTC) 20 MG tablet Take 20 mg by mouth daily.    . famotidine  (PEPCID ) 20 MG tablet Take 1 tablet (20 mg total) by mouth daily. (Patient not taking: Reported on 05/30/2024) 30 tablet 5  . meclizine  (ANTIVERT ) 12.5 MG tablet Take 1 tablet (12.5 mg total) by mouth 3 (three) times daily as needed for dizziness. (Patient not taking: Reported on 05/30/2024) 30 tablet 0   No facility-administered medications prior to visit.    Allergies  Allergen Reactions  . Tylenol  [Acetaminophen ]     Mixed with Advil  - Generalized tremor  Review of Systems  Constitutional:  Positive for malaise/fatigue. Negative for fever.  HENT:  Negative for congestion.   Eyes:  Negative for blurred vision.  Respiratory:  Negative for shortness of breath.   Cardiovascular:  Negative for chest pain, palpitations and leg swelling.  Gastrointestinal:  Positive for abdominal pain, constipation, heartburn and nausea. Negative for blood in stool.  Genitourinary:  Negative for dysuria and frequency.  Musculoskeletal:  Positive for back pain, falls and joint pain.  Skin:  Negative for rash.  Neurological:  Negative for dizziness, loss of consciousness and headaches.  Endo/Heme/Allergies:  Negative for environmental allergies.  Psychiatric/Behavioral:  Negative for depression. The patient is not nervous/anxious.        Objective:    Physical Exam Constitutional:      General: She is not in acute distress.    Appearance: Normal appearance. She is well-developed. She is not toxic-appearing.  HENT:     Head: Normocephalic and atraumatic.     Right Ear: External ear normal.     Left Ear: External ear normal.     Nose: Nose normal.  Eyes:     General:        Right eye: No discharge.        Left eye: No  discharge.     Conjunctiva/sclera: Conjunctivae normal.  Neck:     Thyroid : No thyromegaly.  Cardiovascular:     Rate and Rhythm: Normal rate and regular rhythm.     Heart sounds: Normal heart sounds. No murmur heard. Pulmonary:     Effort: Pulmonary effort is normal. No respiratory distress.     Breath sounds: Normal breath sounds.  Abdominal:     General: Bowel sounds are normal.     Palpations: Abdomen is soft.     Tenderness: There is no abdominal tenderness. There is no guarding.  Musculoskeletal:        General: Normal range of motion.     Cervical back: Neck supple.  Lymphadenopathy:     Cervical: No cervical adenopathy.  Skin:    General: Skin is warm and dry.  Neurological:     Mental Status: She is alert and oriented to person, place, and time.  Psychiatric:        Mood and Affect: Mood normal.        Behavior: Behavior normal.        Thought Content: Thought content normal.        Judgment: Judgment normal.     BP 106/68 (BP Location: Right Arm, Patient Position: Sitting, Cuff Size: Normal)   Pulse 67   Temp 98.2 F (36.8 C) (Oral)   Resp 12   Ht 4' 11 (1.499 m)   Wt 102 lb 6.4 oz (46.4 kg)   SpO2 97%   BMI 20.68 kg/m  Wt Readings from Last 3 Encounters:  07/02/24 102 lb 6.4 oz (46.4 kg)  06/08/24 100 lb (45.4 kg)  05/30/24 105 lb 6.4 oz (47.8 kg)    Diabetic Foot Exam - Simple   No data filed    Lab Results  Component Value Date   WBC 8.9 05/16/2024   HGB 12.2 05/16/2024   HCT 37.7 05/16/2024   PLT 289 05/16/2024   GLUCOSE 92 05/17/2024   CHOL 186 05/04/2023   TRIG 93.0 05/04/2023   HDL 54.80 05/04/2023   LDLCALC 112 (H) 05/04/2023   ALT 10 05/16/2024   AST 20 05/16/2024   NA 134 (L) 05/17/2024   K 4.2 05/17/2024  CL 103 05/17/2024   CREATININE 0.96 05/17/2024   BUN 11 05/17/2024   CO2 23 05/17/2024   TSH 1.977 05/16/2024   HGBA1C 6.0 05/04/2023    Lab Results  Component Value Date   TSH 1.977 05/16/2024   Lab Results   Component Value Date   WBC 8.9 05/16/2024   HGB 12.2 05/16/2024   HCT 37.7 05/16/2024   MCV 94.7 05/16/2024   PLT 289 05/16/2024   Lab Results  Component Value Date   NA 134 (L) 05/17/2024   K 4.2 05/17/2024   CO2 23 05/17/2024   GLUCOSE 92 05/17/2024   BUN 11 05/17/2024   CREATININE 0.96 05/17/2024   BILITOT 0.5 05/16/2024   ALKPHOS 67 05/16/2024   AST 20 05/16/2024   ALT 10 05/16/2024   PROT 6.2 (L) 05/16/2024   ALBUMIN 3.6 05/16/2024   CALCIUM 9.2 05/17/2024   ANIONGAP 8 05/17/2024   GFR 60.81 05/04/2023   Lab Results  Component Value Date   CHOL 186 05/04/2023   Lab Results  Component Value Date   HDL 54.80 05/04/2023   Lab Results  Component Value Date   LDLCALC 112 (H) 05/04/2023   Lab Results  Component Value Date   TRIG 93.0 05/04/2023   Lab Results  Component Value Date   CHOLHDL 3 05/04/2023   Lab Results  Component Value Date   HGBA1C 6.0 05/04/2023       Assessment & Plan:  Hyperglycemia Assessment & Plan: hgba1c acceptable, minimize simple carbs. Increase exercise as tolerated.    Hypothyroidism, unspecified type Assessment & Plan: On Levothyroxine , continue to monitor    Osteoporosis, unspecified osteoporosis type, unspecified pathological fracture presence Assessment & Plan: Encouraged to get adequate exercise, calcium and vitamin d  intake    Hyperlipidemia, unspecified hyperlipidemia type Assessment & Plan: Encourage heart healthy diet such as MIND or DASH diet, increase exercise, avoid trans fats, simple carbohydrates and processed foods, consider a krill or fish or flaxseed oil cap daily.       Assessment and Plan Assessment & Plan Hiatal hernia with gastroesophageal reflux disease (GERD) Chronic hiatal hernia with worsening GERD symptoms, including increased frequency and severity of postprandial pain. The hernia causes the stomach to slide into the chest, exacerbating GERD. Surgical options were discussed but are high  risk due to potential complications and recurrence, especially given her age. Non-surgical management is emphasized, with surgery considered if symptoms become intolerable, understanding risks such as anesthesia complications and surgical failure. - Continue famotidine  20 mg orally at bedtime. - Add omeprazole  20 mg orally in the morning. - Use Mylanta as needed for acute symptom relief. - Encourage small, frequent meals and avoid lying down immediately after eating. - Avoid high-fat foods, especially late in the day. - Consider referral to a gastroenterologist if symptoms persist despite current management.  Constipation Chronic constipation with recent exacerbation, impacting GERD symptoms. Current regimen includes Miralax  once daily. Emphasized importance of hydration and dietary fiber. - Increase Miralax  to twice daily if needed. - Add Benefiber to daily regimen. - Ensure adequate hydration with at least 60 ounces of water daily, consumed in small amounts throughout the day. - Use milk of magnesia with warm prune juice as needed for acute constipation relief. - Consider duaklir suppository if no relief from oral regimen.  Chronic back pain due to vertebral compression fractures (T11 and T12) Chronic back pain secondary to T11 and T12 vertebral compression fractures. Pain management is challenging due to adverse reactions to pain medications.  Non-pharmacological options for pain relief were discussed. - Use moist heat and topical treatments for pain relief. - Consider CBD cream with menthol for topical pain management.  Recording duration: 36 minutes     Harlene Horton, MD

## 2024-07-04 DIAGNOSIS — Z48812 Encounter for surgical aftercare following surgery on the circulatory system: Secondary | ICD-10-CM | POA: Diagnosis not present

## 2024-07-04 DIAGNOSIS — K449 Diaphragmatic hernia without obstruction or gangrene: Secondary | ICD-10-CM | POA: Diagnosis not present

## 2024-07-04 DIAGNOSIS — E785 Hyperlipidemia, unspecified: Secondary | ICD-10-CM | POA: Diagnosis not present

## 2024-07-04 DIAGNOSIS — I441 Atrioventricular block, second degree: Secondary | ICD-10-CM | POA: Diagnosis not present

## 2024-07-04 DIAGNOSIS — G3184 Mild cognitive impairment, so stated: Secondary | ICD-10-CM | POA: Diagnosis not present

## 2024-07-04 DIAGNOSIS — K219 Gastro-esophageal reflux disease without esophagitis: Secondary | ICD-10-CM | POA: Diagnosis not present

## 2024-07-05 LAB — CUP PACEART REMOTE DEVICE CHECK
Battery Remaining Longevity: 131 mo
Battery Voltage: 3.18 V
Brady Statistic AP VP Percent: 40.05 %
Brady Statistic AP VS Percent: 0.01 %
Brady Statistic AS VP Percent: 59.91 %
Brady Statistic AS VS Percent: 0.03 %
Brady Statistic RA Percent Paced: 40.05 %
Brady Statistic RV Percent Paced: 99.96 %
Date Time Interrogation Session: 20250819065442
Implantable Lead Connection Status: 753985
Implantable Lead Connection Status: 753985
Implantable Lead Implant Date: 20250703
Implantable Lead Implant Date: 20250703
Implantable Lead Location: 753859
Implantable Lead Location: 753860
Implantable Lead Model: 3830
Implantable Lead Model: 5076
Implantable Pulse Generator Implant Date: 20250703
Lead Channel Impedance Value: 304 Ohm
Lead Channel Impedance Value: 399 Ohm
Lead Channel Impedance Value: 418 Ohm
Lead Channel Impedance Value: 608 Ohm
Lead Channel Pacing Threshold Amplitude: 0.625 V
Lead Channel Pacing Threshold Amplitude: 0.75 V
Lead Channel Pacing Threshold Pulse Width: 0.4 ms
Lead Channel Pacing Threshold Pulse Width: 0.4 ms
Lead Channel Sensing Intrinsic Amplitude: 26.625 mV
Lead Channel Sensing Intrinsic Amplitude: 26.625 mV
Lead Channel Sensing Intrinsic Amplitude: 3.375 mV
Lead Channel Sensing Intrinsic Amplitude: 3.375 mV
Lead Channel Setting Pacing Amplitude: 3.5 V
Lead Channel Setting Pacing Amplitude: 3.5 V
Lead Channel Setting Pacing Pulse Width: 0.4 ms
Lead Channel Setting Sensing Sensitivity: 1.2 mV
Zone Setting Status: 755011

## 2024-07-09 ENCOUNTER — Ambulatory Visit: Payer: Self-pay

## 2024-07-09 ENCOUNTER — Ambulatory Visit: Payer: Self-pay | Admitting: Cardiovascular Disease

## 2024-07-09 NOTE — Telephone Encounter (Signed)
 Spoke with patient to see if an appointment had been made and she reports no. But she will contact the office to schedule the appointment herself.

## 2024-07-09 NOTE — Telephone Encounter (Signed)
 FYI Only or Action Required?: FYI only for provider.  Patient was last seen in primary care on 07/02/2024 by Domenica Harlene LABOR, MD.  Called Nurse Triage reporting Abdominal Pain.  Symptoms began several weeks ago.  Interventions attempted: Prescription medications: prilosec, Rest, hydration, or home remedies, and Dietary changes. Also Mylanta  Symptoms are: unchanged.  Triage Disposition: Call PCP Within 24 Hours  Patient/caregiver understands and will follow disposition?: yes  FYI: Patient called to request GI referral per 8/18 PCP visit. Per chart review, GI referral already in place on 8/8 to [facility]. This RN provided patient with contact information for f/u and scheduling.   Copied from CRM #[id number]. Topic: Clinical - Medical Advice >> Jul 09, 2024  2:40 PM Henretta I wrote: Reason for CRM: Patient would like to know if Dr.Blyth can send out a referral for her to [facility] for her stomach pain that has been going on or if Dr.Blyth would like to see her first to further discuss if needed. Patient would like a call back to discuss. Reason for Disposition  [1] Follow-up call from patient regarding patient's clinical status AND [2] information NON-URGENT  Answer Assessment - Initial Assessment Questions 1. REASON FOR CALL or QUESTION: What is your reason for calling today? or How can I best     Patient called to advise PCP that abd pain persisted  2. CALLER: Document the source of call. (e.g., laboratory staff, caregiver or patient).     Patient  Protocols used: PCP Call - No Triage-A-AH

## 2024-07-10 DIAGNOSIS — K219 Gastro-esophageal reflux disease without esophagitis: Secondary | ICD-10-CM | POA: Diagnosis not present

## 2024-07-10 DIAGNOSIS — E785 Hyperlipidemia, unspecified: Secondary | ICD-10-CM | POA: Diagnosis not present

## 2024-07-10 DIAGNOSIS — G3184 Mild cognitive impairment, so stated: Secondary | ICD-10-CM | POA: Diagnosis not present

## 2024-07-10 DIAGNOSIS — I441 Atrioventricular block, second degree: Secondary | ICD-10-CM | POA: Diagnosis not present

## 2024-07-10 DIAGNOSIS — Z48812 Encounter for surgical aftercare following surgery on the circulatory system: Secondary | ICD-10-CM | POA: Diagnosis not present

## 2024-07-10 DIAGNOSIS — K449 Diaphragmatic hernia without obstruction or gangrene: Secondary | ICD-10-CM | POA: Diagnosis not present

## 2024-07-17 DIAGNOSIS — Z48812 Encounter for surgical aftercare following surgery on the circulatory system: Secondary | ICD-10-CM | POA: Diagnosis not present

## 2024-07-17 DIAGNOSIS — I441 Atrioventricular block, second degree: Secondary | ICD-10-CM | POA: Diagnosis not present

## 2024-07-17 DIAGNOSIS — E785 Hyperlipidemia, unspecified: Secondary | ICD-10-CM | POA: Diagnosis not present

## 2024-07-17 DIAGNOSIS — G3184 Mild cognitive impairment, so stated: Secondary | ICD-10-CM | POA: Diagnosis not present

## 2024-07-17 DIAGNOSIS — K219 Gastro-esophageal reflux disease without esophagitis: Secondary | ICD-10-CM | POA: Diagnosis not present

## 2024-07-17 DIAGNOSIS — K449 Diaphragmatic hernia without obstruction or gangrene: Secondary | ICD-10-CM | POA: Diagnosis not present

## 2024-07-25 DIAGNOSIS — G3184 Mild cognitive impairment, so stated: Secondary | ICD-10-CM | POA: Diagnosis not present

## 2024-07-25 DIAGNOSIS — E785 Hyperlipidemia, unspecified: Secondary | ICD-10-CM | POA: Diagnosis not present

## 2024-07-25 DIAGNOSIS — K449 Diaphragmatic hernia without obstruction or gangrene: Secondary | ICD-10-CM | POA: Diagnosis not present

## 2024-07-25 DIAGNOSIS — K219 Gastro-esophageal reflux disease without esophagitis: Secondary | ICD-10-CM | POA: Diagnosis not present

## 2024-07-25 DIAGNOSIS — I441 Atrioventricular block, second degree: Secondary | ICD-10-CM | POA: Diagnosis not present

## 2024-07-25 DIAGNOSIS — Z48812 Encounter for surgical aftercare following surgery on the circulatory system: Secondary | ICD-10-CM | POA: Diagnosis not present

## 2024-08-08 NOTE — Progress Notes (Signed)
 Remote PPM Transmission

## 2024-08-16 ENCOUNTER — Ambulatory Visit: Admitting: Gastroenterology

## 2024-08-22 ENCOUNTER — Encounter: Admitting: Cardiovascular Disease

## 2024-08-23 ENCOUNTER — Ambulatory Visit: Attending: Cardiology | Admitting: Cardiology

## 2024-08-23 ENCOUNTER — Encounter: Payer: Self-pay | Admitting: Cardiology

## 2024-08-23 VITALS — BP 136/77 | HR 60 | Ht 59.0 in | Wt 105.0 lb

## 2024-08-23 DIAGNOSIS — I441 Atrioventricular block, second degree: Secondary | ICD-10-CM | POA: Insufficient documentation

## 2024-08-23 LAB — CUP PACEART INCLINIC DEVICE CHECK
Date Time Interrogation Session: 20251009132056
Implantable Lead Connection Status: 753985
Implantable Lead Connection Status: 753985
Implantable Lead Implant Date: 20250703
Implantable Lead Implant Date: 20250703
Implantable Lead Location: 753859
Implantable Lead Location: 753860
Implantable Lead Model: 3830
Implantable Lead Model: 5076
Implantable Pulse Generator Implant Date: 20250703
Lead Channel Impedance Value: 475 Ohm
Lead Channel Impedance Value: 665 Ohm
Lead Channel Pacing Threshold Amplitude: 0.5 V
Lead Channel Pacing Threshold Amplitude: 0.625 V
Lead Channel Pacing Threshold Amplitude: 0.75 V
Lead Channel Pacing Threshold Amplitude: 0.75 V
Lead Channel Pacing Threshold Pulse Width: 0.4 ms
Lead Channel Pacing Threshold Pulse Width: 0.4 ms
Lead Channel Pacing Threshold Pulse Width: 0.4 ms
Lead Channel Pacing Threshold Pulse Width: 0.4 ms

## 2024-08-23 NOTE — Patient Instructions (Signed)
 Medication Instructions:  - Your physician recommends that you continue on your current medications as directed. Please refer to the Current Medication list given to you today.  *If you need a refill on your cardiac medications before your next appointment, please call your pharmacy*  Lab Work: - none ordered  If you have labs (blood work) drawn today and your tests are completely normal, you will receive your results only by: MyChart Message (if you have MyChart) OR A paper copy in the mail If you have any lab test that is abnormal or we need to change your treatment, we will call you to review the results.  Testing/Procedures: - none ordered  Follow-Up: At [facility], [place], you and your health needs are our priority.  As part of our continuing mission to provide you with exceptional heart care, our providers are all part of one team.  This team includes your primary Cardiologist (physician) and Advanced Practice Providers or APPs (Physician Assistants and Nurse Practitioners) who all work together to provide you with the care you need, when you need it.  Your next appointment:   1 year(s)  Provider:   You will see one of the following Advanced Practice Providers on your designated Care Team:   Charlies Arthur, [place] Ozell Jodie Passey, PA-C Suzann Riddle, NP Daphne Barrack, NP Artist Pouch, PA-C    We recommend signing up for the patient portal called MyChart.  Sign up information is provided on this After Visit Summary.  MyChart is used to connect with patients for Virtual Visits (Telemedicine).  Patients are able to view lab/test results, encounter notes, upcoming appointments, etc.  Non-urgent messages can be sent to your provider as well.   To learn more about what you can do with MyChart, go to [email or web address].   Other Instructions N/A

## 2024-08-23 NOTE — Progress Notes (Signed)
  Electrophysiology Office Note:   Date:  08/23/2024  ID:  Cynthia Young, DOB 10-02-1930, MRN [id number]  Primary Cardiologist: Cynthia ONEIDA Decent, MD Primary Heart Failure: None Electrophysiologist: Dantavious Snowball Cynthia Norton, MD      History of Present Illness:   Cynthia Young is a 89 y.o. female with h/o second-degree AV block, hypothyroidism seen today for routine electrophysiology followup.   Since last being seen in our clinic the patient reports doing since her pacemaker was implanted she has done well.  She has had no chest pain or shortness of breath.  She has been able to do all of her daily activities.  Her level of fatigue is reduced.  She has much more energy.  she denies chest pain, palpitations, dyspnea, PND, orthopnea, nausea, vomiting, dizziness, syncope, edema, weight gain, or early satiety.   Review of systems complete and found to be negative unless listed in HPI.      EP Information / Studies Reviewed:    EKG is ordered today. Personal review as below.  EKG Interpretation Date/Time:  Thursday August 23 2024 11:38:58 EDT Ventricular Rate:  60 PR Interval:  176 QRS Duration:  148 QT Interval:  442 QTC Calculation: 442 R Axis:   -61  Text Interpretation: AV dual-paced rhythm When compared with ECG of 16-May-2024 12:17, AV SEQUENTIAL PACEMAKER Confirmed by Wilhelm Ganaway ([id number]) on 08/23/2024 12:05:35 PM   PPM Interrogation-  reviewed in detail today,  See PACEART report.  Device History: Medtronic Dual Chamber PPM implanted 05/17/24 for Second Degree AV block  Risk Assessment/Calculations:             Physical Exam:   VS:  BP 136/77   Pulse 60   Ht 4' 11 (1.499 m)   Wt 105 lb (47.6 kg)   SpO2 97%   BMI 21.21 kg/m    Wt Readings from Last 3 Encounters:  08/23/24 105 lb (47.6 kg)  07/02/24 102 lb 6.4 oz (46.4 kg)  06/08/24 100 lb (45.4 kg)     GEN: Well nourished, well developed in no acute distress NECK: No JVD; No carotid bruits CARDIAC: Regular rate and  rhythm, no murmurs, rubs, gallops RESPIRATORY:  Clear to auscultation without rales, wheezing or rhonchi  ABDOMEN: Soft, non-tender, non-distended EXTREMITIES:  No edema; No deformity   ASSESSMENT AND PLAN:    Second Degree AV block s/p Medtronic PPM  Normal PPM function See Pace Art report No changes today  Disposition:   Follow up with EP APP in 12 months  Signed, Kiannah Grunow Cynthia Norton, MD

## 2024-08-24 ENCOUNTER — Ambulatory Visit: Admitting: Physician Assistant

## 2024-08-26 ENCOUNTER — Ambulatory Visit: Payer: Self-pay | Admitting: Cardiology

## 2024-09-24 ENCOUNTER — Ambulatory Visit: Admitting: Gastroenterology

## 2024-09-24 ENCOUNTER — Ambulatory Visit

## 2024-09-24 NOTE — Progress Notes (Deleted)
 Cynthia Young [id number] 02-19-1930   Chief Complaint:  Referring Provider: Domenica Harlene LABOR, MD Primary GI MD: Dr. Wilhelmenia  HPI: Cynthia Young is a 89 y.o. female with past medical history of hypothyroidism, cholecystectomy s/p ERCP for choledocholithiasis, PUD, GERD, HLD, pacemaker in place who presents today for a complaint of *** .    Seen here by Dr. Wilhelmenia in 2021.  She has a history of peptic ulcer disease.  At the time of her 09/19/2020 visit she was having pyrosis.  We recommended she start omeprazole  20 mg daily or at least Pepcid  20 mg daily.  Repeat EGD was recommended to ensure healing of gastric ulcer.  Repeat EGD wasn't pursued. Screening colonoscopy was performed deferred by patient.  Last seen in office 07/27/2022 by Cynthia Dasen, NP for new intermittent solid food dysphagia as well as postprandial dyspeptic symptoms.  History of PUD, though symptoms at that time seem more like dyspepsia, relieved with Pepcid  as needed and weight stable.  Advised that if symptoms not improving could consider EGD based on her history.  Barium swallow was ordered for further evaluation of dysphagia, though she did have improvement since taking swallowing precautions.  Barium swallow 08/02/2022 showed laryngeal penetration with vallecular pooling, no aspiration, no mass, stricture, or obstruction.  Modified barium swallow study was offered but patient felt her symptoms had improved and did not desire to proceed with further testing.  Last seen by cardiology 08/23/2024 for electrophysiology follow-up.  History of second-degree AV block s/p pacemaker implantation in July.  Doing well at that time and 55-month follow-up recommended.  Discussed the use of AI scribe software for clinical note transcription with the patient, who gave verbal consent to proceed.  History of Present Illness       Previous GI Procedures/Imaging      Past Medical History:  Diagnosis Date   Arthritis     Hyperglycemia 09/04/2020   Hyperlipidemia    Hypothyroid    Pacemaker    Vertigo     Past Surgical History:  Procedure Laterality Date   BACK SURGERY     BIOPSY  04/12/2020   Procedure: BIOPSY;  Surgeon: Cynthia Young., MD;  Location: [facility];  Service: Gastroenterology;;   ROMAYNE Left    CHOLECYSTECTOMY N/A 04/11/2020   Procedure: LAPAROSCOPIC CHOLECYSTECTOMY WITH INTRAOPERATIVE CHOLANGIOGRAM;  Surgeon: Cynthia Lenis, MD;  Location: [facility];  Service: General;  Laterality: N/A;   ERCP N/A 04/12/2020   Procedure: ENDOSCOPIC RETROGRADE CHOLANGIOPANCREATOGRAPHY (ERCP);  Surgeon: Cynthia Young., MD;  Location: [facility];  Service: Gastroenterology;  Laterality: N/A;   FOOT SURGERY Right    bunion   PACEMAKER IMPLANT N/A 05/17/2024   Procedure: PACEMAKER IMPLANT;  Surgeon: Cynthia Eulas BRAVO, MD;  [facility];  Service: Cardiovascular;  Laterality: N/A;   REMOVAL OF STONES  04/12/2020   Procedure: REMOVAL OF STONES;  Surgeon: Cynthia Young., MD;  Location: [facility];  Service: Gastroenterology;;   ANNETT  04/12/2020   Procedure: ANNETT;  Surgeon: Cynthia Young., MD;  Location: [facility];  Service: Gastroenterology;;    Current Outpatient Medications  Medication Sig Dispense Refill   acetaminophen  (TYLENOL ) 500 MG tablet Take 500-1,000 mg by mouth every 6 (six) hours as needed for mild pain (pain score 1-3) or moderate pain (pain score 4-6).     levothyroxine  (SYNTHROID ) 50 MCG tablet TAKE ONE TABLET BY MOUTH ONE TIME DAILY 90 tablet 1   Magnesium  250 MG TABS Take 1 tablet (250 mg  total) by mouth 2 (two) times daily.     omeprazole  (PRILOSEC OTC) 20 MG tablet Take 20 mg by mouth daily.     No current facility-administered medications for this visit.    Allergies as of 09/24/2024 - Review Complete 08/23/2024  Allergen Reaction Noted   Tylenol  [acetaminophen ]  10/22/2023    Family History  Problem  Relation Age of Onset   Cancer Mother    Heart disease Father    Arthritis Father        rheumatoid   Cancer Brother        colon   Colon cancer Brother    Esophageal cancer Neg Hx    Inflammatory bowel disease Neg Hx    Liver disease Neg Hx    Pancreatic cancer Neg Hx    Stomach cancer Neg Hx     Social History   Tobacco Use   Smoking status: Never   Smokeless tobacco: Never  Vaping Use   Vaping status: Never Used  Substance Use Topics   Alcohol use: Yes    Comment: socially   Drug use: Never     Review of Systems:    Constitutional: No weight loss, fever, chills, weakness or fatigue Eyes: No change in vision Ears, Nose, Throat:  No change in hearing or congestion Skin: No rash or itching Cardiovascular: No chest pain, chest pressure or palpitations   Respiratory: No SOB or cough Gastrointestinal: See HPI and otherwise negative Genitourinary: No dysuria or change in urinary frequency Neurological: No headache, dizziness or syncope Musculoskeletal: No new muscle or joint pain Hematologic: No bleeding or bruising    Physical Exam:  Vital signs: There were no vitals taken for this visit.  Constitutional: Cynthia Young, Well developed, Well nourished, alert and cooperative Head:  Normocephalic and atraumatic.  Eyes: No scleral icterus. Conjunctiva pink. Mouth: No oral lesions. Respiratory: Respirations even and unlabored. Lungs clear to auscultation bilaterally.  No wheezes, crackles, or rhonchi.  Cardiovascular:  Regular rate and rhythm. No murmurs. No peripheral edema. Gastrointestinal:  Soft, nondistended, nontender. No rebound or guarding. Normal bowel sounds. No appreciable masses or hepatomegaly. Rectal:  Not performed.  Neurologic:  Alert and oriented x4;  grossly normal neurologically.  Skin:   Dry and intact without significant lesions or rashes. Psychiatric: Oriented to person, place and time. Demonstrates good judgement and reason without abnormal affect or  behaviors.   RELEVANT LABS AND IMAGING: CBC    Component Value Date/Time   WBC 8.9 05/16/2024 1305   RBC 3.98 05/16/2024 1305   HGB 12.2 05/16/2024 1305   HCT 37.7 05/16/2024 1305   PLT 289 05/16/2024 1305   MCV 94.7 05/16/2024 1305   MCH 30.7 05/16/2024 1305   MCHC 32.4 05/16/2024 1305   RDW 13.3 05/16/2024 1305   LYMPHSABS 2.0 05/16/2024 1305   MONOABS 0.7 05/16/2024 1305   EOSABS 0.1 05/16/2024 1305   BASOSABS 0.1 05/16/2024 1305    CMP     Component Value Date/Time   NA 134 (L) 05/17/2024 0754   K 4.2 05/17/2024 0754   CL 103 05/17/2024 0754   CO2 23 05/17/2024 0754   GLUCOSE 92 05/17/2024 0754   BUN 11 05/17/2024 0754   CREATININE 0.96 05/17/2024 0754   CREATININE 0.79 09/04/2020 1125   CALCIUM 9.2 05/17/2024 0754   PROT 6.2 (L) 05/16/2024 1305   ALBUMIN 3.6 05/16/2024 1305   AST 20 05/16/2024 1305   ALT 10 05/16/2024 1305   ALKPHOS 67 05/16/2024 1305   BILITOT  0.5 05/16/2024 1305   GFRNONAA 56 (L) 05/17/2024 0754   GFRAA >60 06/09/2020 0909   Echocardiogram 10/21/2023 1. Left ventricular ejection fraction, by estimation, is 60 to 65% . The left ventricle has normal function. The left ventricle has no regional wall motion abnormalities. Left ventricular diastolic parameters were normal.  2. Right ventricular systolic function is normal. The right ventricular size is normal.  3. The mitral valve is abnormal. Mild mitral valve regurgitation. No evidence of mitral stenosis.  4. The aortic valve is tricuspid. There is moderate calcification of the aortic valve. There is moderate thickening of the aortic valve. Aortic valve regurgitation is trivial. Mild aortic valve stenosis.  5. The inferior vena cava is normal in size with greater than 50% respiratory variability, suggesting right atrial pressure of 3 mmHg.   Assessment/Plan:    Assessment and Plan Assessment & Plan        Camie Furbish, PA-C [facility] 09/24/2024, 8:22 AM  Patient Care  Team: Domenica Harlene LABOR, MD as PCP - General (Family Medicine) O'Neal, Darryle Ned, MD as PCP - Cardiology (Cardiology) Inocencio Soyla Lunger, MD as PCP - Electrophysiology (Cardiology) Carla Milling, RPH-CPP (Pharmacist)

## 2024-10-02 ENCOUNTER — Ambulatory Visit

## 2024-10-02 DIAGNOSIS — I441 Atrioventricular block, second degree: Secondary | ICD-10-CM | POA: Diagnosis not present

## 2024-10-03 LAB — CUP PACEART REMOTE DEVICE CHECK
Battery Remaining Longevity: 147 mo
Battery Voltage: 3.15 V
Brady Statistic AP VP Percent: 40.3 %
Brady Statistic AP VS Percent: 0 %
Brady Statistic AS VP Percent: 59.63 %
Brady Statistic AS VS Percent: 0.07 %
Brady Statistic RA Percent Paced: 40.31 %
Brady Statistic RV Percent Paced: 99.93 %
Date Time Interrogation Session: 20251117201818
Implantable Lead Connection Status: 753985
Implantable Lead Connection Status: 753985
Implantable Lead Implant Date: 20250703
Implantable Lead Implant Date: 20250703
Implantable Lead Location: 753859
Implantable Lead Location: 753860
Implantable Lead Model: 3830
Implantable Lead Model: 5076
Implantable Pulse Generator Implant Date: 20250703
Lead Channel Impedance Value: 285 Ohm
Lead Channel Impedance Value: 361 Ohm
Lead Channel Impedance Value: 418 Ohm
Lead Channel Impedance Value: 608 Ohm
Lead Channel Pacing Threshold Amplitude: 0.625 V
Lead Channel Pacing Threshold Amplitude: 0.75 V
Lead Channel Pacing Threshold Pulse Width: 0.4 ms
Lead Channel Pacing Threshold Pulse Width: 0.4 ms
Lead Channel Sensing Intrinsic Amplitude: 15.875 mV
Lead Channel Sensing Intrinsic Amplitude: 15.875 mV
Lead Channel Sensing Intrinsic Amplitude: 3 mV
Lead Channel Sensing Intrinsic Amplitude: 3 mV
Lead Channel Setting Pacing Amplitude: 1.5 V
Lead Channel Setting Pacing Amplitude: 2 V
Lead Channel Setting Pacing Pulse Width: 0.4 ms
Lead Channel Setting Sensing Sensitivity: 1.2 mV
Zone Setting Status: 755011

## 2024-10-05 NOTE — Progress Notes (Signed)
 Remote PPM Transmission

## 2024-10-07 ENCOUNTER — Ambulatory Visit: Payer: Self-pay | Admitting: Cardiology

## 2024-10-07 ENCOUNTER — Other Ambulatory Visit: Payer: Self-pay | Admitting: Family Medicine

## 2024-10-25 DIAGNOSIS — D225 Melanocytic nevi of trunk: Secondary | ICD-10-CM | POA: Diagnosis not present

## 2024-10-25 DIAGNOSIS — L578 Other skin changes due to chronic exposure to nonionizing radiation: Secondary | ICD-10-CM | POA: Diagnosis not present

## 2024-10-25 DIAGNOSIS — L814 Other melanin hyperpigmentation: Secondary | ICD-10-CM | POA: Diagnosis not present

## 2024-10-25 DIAGNOSIS — L820 Inflamed seborrheic keratosis: Secondary | ICD-10-CM | POA: Diagnosis not present

## 2024-10-29 ENCOUNTER — Ambulatory Visit

## 2024-10-29 VITALS — Ht 59.0 in | Wt 102.0 lb

## 2024-10-29 DIAGNOSIS — Z0000 Encounter for general adult medical examination without abnormal findings: Secondary | ICD-10-CM | POA: Diagnosis not present

## 2024-10-29 NOTE — Progress Notes (Signed)
 Chief Complaint  Patient presents with   Medicare Wellness     Subjective:   Cynthia Young is a 89 y.o. female who presents for a Medicare Annual Wellness Visit.  Visit info / Clinical Intake: Medicare Wellness Visit Type:: Subsequent Annual Wellness Visit Persons participating in visit and providing information:: patient Medicare Wellness Visit Mode:: Telephone If telephone:: video declined Since this visit was completed virtually, some vitals may be partially provided or unavailable. Missing vitals are due to the limitations of the virtual format.: Unable to obtain vitals - no equipment If Telephone or Video please confirm:: I connected with patient using audio/video enable telemedicine. I verified patient identity with two identifiers, discussed telehealth limitations, and patient agreed to proceed. Patient Location:: home Provider Location:: office Interpreter Needed?: No Pre-visit prep was completed: yes AWV questionnaire completed by patient prior to visit?: no Living arrangements:: (!) lives alone Patient's Overall Health Status Rating: good Typical amount of pain: some (arthritis) Does pain affect daily life?: no Are you currently prescribed opioids?: no  Dietary Habits and Nutritional Risks How many meals a day?: 4 (eats small amounts and snacks) Eats fruit and vegetables daily?: yes Most meals are obtained by: preparing own meals In the last 2 weeks, have you had any of the following?: (!) nausea, vomiting, diarrhea (has intermittent diarrhea for years depending food she is eating) Diabetic:: no  Functional Status Activities of Daily Living (to include ambulation/medication): Independent Ambulation: Independent Medication Administration: Independent Home Management (perform basic housework or laundry): Independent Manage your own finances?: yes (with assistance from daughter) Primary transportation is: driving Concerns about vision?: no *vision screening is  required for [facility]* (up to date with eye EXam at [facility]) Concerns about hearing?: (!) yes Uses hearing aids?: (!) yes  Fall Screening Falls in the past year?: 1 (Fell in July while in backyard due to sudden movement) Number of falls in past year: 1 Was there an injury with Fall?: 1 Fall Risk Category Calculator: 3 Patient Fall Risk Level: High Fall Risk  Fall Risk Patient at Risk for Falls Due to: History of fall(s); Orthopedic patient Fall risk Follow up: Falls evaluation completed  Home and Transportation Safety: All rugs have non-skid backing?: yes All stairs or steps have railings?: N/A, no stairs Grab bars in the bathtub or shower?: (!) no (has walk in shower) Have non-skid surface in bathtub or shower?: (!) no (uses shower chair) Good home lighting?: yes Regular seat belt use?: yes Hospital stays in the last year:: no  Cognitive Assessment Difficulty concentrating, remembering, or making decisions? : yes (feels very forgetful, short term memory is bad) Will 6CIT or Mini Cog be Completed: yes What year is it?: 0 points What month is it?: 0 points Give patient an address phrase to remember (5 components): [address], [place]  About what time is it?: 0 points Count backwards from 20 to 1: 0 points Say the months of the year in reverse: 0 points Repeat the address phrase from earlier: 0 points 6 CIT Score: 0 points  Advance Directives (For Healthcare) Does Patient Have a Medical Advance Directive?: Yes Does patient want to make changes to medical advance directive?: No - Patient declined Type of Advance Directive: Healthcare Power of [place]; Living will Copy of Healthcare Power of Attorney in Chart?: No - copy requested Copy of Living Will in Chart?: No - copy requested Would patient like information on creating a medical advance directive?: No - Patient declined  Reviewed/Updated  Reviewed/Updated: Reviewed All (Medical, Surgical, Family,  Medications, Allergies, Care Teams, Patient Goals)    Allergies (verified) Tylenol  [acetaminophen ]   Current Medications (verified) Outpatient Encounter Medications as of 10/29/2024  Medication Sig   acetaminophen  (TYLENOL ) 500 MG tablet Take 500-1,000 mg by mouth every 6 (six) hours as needed for mild pain (pain score 1-3) or moderate pain (pain score 4-6).   levothyroxine  (SYNTHROID ) 50 MCG tablet Take 1 tablet (50 mcg total) by mouth daily before breakfast. Needs appt   Magnesium  250 MG TABS Take 1 tablet (250 mg total) by mouth 2 (two) times daily.   omeprazole  (PRILOSEC OTC) 20 MG tablet Take 20 mg by mouth daily.   No facility-administered encounter medications on file as of 10/29/2024.    History: Past Medical History:  Diagnosis Date   Arthritis    Hyperglycemia 09/04/2020   Hyperlipidemia    Hypothyroid    Pacemaker    Vertigo    Past Surgical History:  Procedure Laterality Date   BACK SURGERY     BIOPSY  04/12/2020   Procedure: BIOPSY;  Surgeon: Wilhelmenia Aloha Raddle., MD;  Location: [facility];  Service: Gastroenterology;;   ROMAYNE Left    CHOLECYSTECTOMY N/A 04/11/2020   Procedure: LAPAROSCOPIC CHOLECYSTECTOMY WITH INTRAOPERATIVE CHOLANGIOGRAM;  Surgeon: Ethyl Lenis, MD;  Location: [facility];  Service: General;  Laterality: N/A;   ERCP N/A 04/12/2020   Procedure: ENDOSCOPIC RETROGRADE CHOLANGIOPANCREATOGRAPHY (ERCP);  Surgeon: Wilhelmenia Aloha Raddle., MD;  Location: [facility];  Service: Gastroenterology;  Laterality: N/A;   FOOT SURGERY Right    bunion   PACEMAKER IMPLANT N/A 05/17/2024   Procedure: PACEMAKER IMPLANT;  Surgeon: Nancey Eulas BRAVO, MD;  [facility];  Service: Cardiovascular;  Laterality: N/A;   REMOVAL OF STONES  04/12/2020   Procedure: REMOVAL OF STONES;  Surgeon: Wilhelmenia Aloha Raddle., MD;  Location: [facility];  Service: Gastroenterology;;   ANNETT  04/12/2020   Procedure: ANNETT;  Surgeon:  Mansouraty, Aloha Raddle., MD;  Location: [facility];  Service: Gastroenterology;;   Family History  Problem Relation Age of Onset   Cancer Mother    Heart disease Father    Arthritis Father        rheumatoid   Cancer Brother        colon   Colon cancer Brother    Esophageal cancer Neg Hx    Inflammatory bowel disease Neg Hx    Liver disease Neg Hx    Pancreatic cancer Neg Hx    Stomach cancer Neg Hx    Social History   Occupational History   Not on file  Tobacco Use   Smoking status: Never   Smokeless tobacco: Never  Vaping Use   Vaping status: Never Used  Substance and Sexual Activity   Alcohol use: Yes    Comment: socially   Drug use: Never   Sexual activity: Not Currently   Tobacco Counseling Counseling given: Not Answered  SDOH Screenings   Food Insecurity: No Food Insecurity (10/29/2024)  Housing: Low Risk (10/29/2024)  Transportation Needs: No Transportation Needs (10/29/2024)  Utilities: Not At Risk (10/29/2024)  Alcohol Screen: Low Risk (09/23/2023)  Depression (PHQ2-9): Low Risk (10/29/2024)  Financial Resource Strain: Low Risk (09/23/2023)  Physical Activity: Inactive (10/29/2024)  Social Connections: Moderately Integrated (10/29/2024)  Stress: No Stress Concern Present (10/29/2024)  Tobacco Use: Low Risk (10/29/2024)  Health Literacy: Adequate Health Literacy (09/23/2023)   See flowsheets for full screening details  Depression Screen PHQ 2 & 9 Depression Scale- Over the  past 2 weeks, how often have you been bothered by any of the following problems? Little interest or pleasure in doing things: 0 Feeling down, depressed, or hopeless (PHQ Adolescent also includes...irritable): 0 PHQ-2 Total Score: 0 Trouble falling or staying asleep, or sleeping too much: 1 (sometimes is hard to get to sleep) Feeling tired or having little energy: 0 Poor appetite or overeating (PHQ Adolescent also includes...weight loss): 0 Feeling bad about yourself - or that you  are a failure or have let yourself or your family down: 1 (I'm a negative person anyway and have negative thoughts from time to time) Trouble concentrating on things, such as reading the newspaper or watching television (PHQ Adolescent also includes...like school work): 0 Moving or speaking so slowly that other people could have noticed. Or the opposite - being so fidgety or restless that you have been moving around a lot more than usual: 0 Thoughts that you would be better off dead, or of hurting yourself in some way: 0 PHQ-9 Total Score: 2     Goals Addressed             This Visit's Progress    Patient Stated   On track    Increase water intake             Objective:    Today's Vitals   10/29/24 1028  Weight: 102 lb (46.3 kg)  Height: 4' 11 (1.499 m)   Body mass index is 20.6 kg/m.  Hearing/Vision screen No results found. Immunizations and Health Maintenance Health Maintenance  Topic Date Due   DTaP/Tdap/Td (1 - Tdap) Never done   Influenza Vaccine  06/15/2024   Medicare Annual Wellness (AWV)  10/29/2025   Pneumococcal Vaccine: 50+ Years  Completed   Bone Density Scan  Completed   Meningococcal B Vaccine  Aged Out   COVID-19 Vaccine  Discontinued   Zoster Vaccines- Shingrix  Discontinued        Assessment/Plan:  This is a routine wellness examination for [facility].  Patient Care Team: Domenica Harlene LABOR, MD as PCP - General (Family Medicine) O'Neal, Darryle Ned, MD as PCP - Cardiology (Cardiology) Inocencio Soyla Lunger, MD as PCP - Electrophysiology (Cardiology) Carla Milling, RPH-CPP (Pharmacist)  I have personally reviewed and noted the following in the patients chart:   Medical and social history Use of alcohol, tobacco or illicit drugs  Current medications and supplements including opioid prescriptions. Functional ability and status Nutritional status Physical activity Advanced directives List of other physicians Hospitalizations, surgeries, and  ER visits in previous 12 months Vitals Screenings to include cognitive, depression, and falls Referrals and appointments  No orders of the defined types were placed in this encounter.  In addition, I have reviewed and discussed with patient certain preventive protocols, quality metrics, and best practice recommendations. A written personalized care plan for preventive services as well as general preventive health recommendations were provided to patient.   Lolita Libra, CMA   10/29/2024   Return in 1 year (on 10/29/2025).  After Visit Summary: (MyChart) Due to this being a telephonic visit, the after visit summary with patients personalized plan was offered to patient via MyChart   Nurse Notes: nothing significant to report

## 2024-10-29 NOTE — Patient Instructions (Addendum)
 [name],  Thank you for taking the time for your Medicare Wellness Visit. I appreciate your continued commitment to your health goals. Please review the care plan we discussed, and feel free to reach out if I can assist you further.  Please note that Annual Wellness Visits do not include a physical exam. Some assessments may be limited, especially if the visit was conducted virtually. If needed, we may recommend an in-person follow-up with your provider.  Ongoing Care Seeing your primary care provider every 3 to 6 months helps us  monitor your health and provide consistent, personalized care.   Harlene Jolly, NP: 11/05/24 @ 9:40am (can get flu vaccine at this visit and can get tetanus from the pharmacy downstairs if you want to do it while you are here)  Medicare AWV: 10/31/25 10:20am, telephone   Recommended Screenings: You will need to get the following vaccines at your local pharmacy: Tetanus  Health Maintenance  Topic Date Due   DTaP/Tdap/Td vaccine (1 - Tdap) Never done   Flu Shot  06/15/2024   Medicare Annual Wellness Visit  09/22/2024   Pneumococcal Vaccine for age over 110  Completed   Osteoporosis screening with Bone Density Scan  Completed   Meningitis B Vaccine  Aged Out   COVID-19 Vaccine  Discontinued   Zoster (Shingles) Vaccine  Discontinued       10/29/2024   10:33 AM  Advanced Directives  Does Patient Have a Medical Advance Directive? Yes  Type of Estate Agent of [place];Living will  Does patient want to make changes to medical advance directive? No - Patient declined  Copy of Healthcare Power of Attorney in Chart? No - copy requested  Would patient like information on creating a medical advance directive? No - Patient declined   Please bring a copy of your health care power of attorney and living will to the office to be added to your chart at your convenience. You can mail a copy to [facility] [address]  [place], [place] [address] or email to [email or web address]   Vision: Annual vision screenings are recommended for early detection of glaucoma, cataracts, and diabetic retinopathy. These exams can also reveal signs of chronic conditions such as diabetes and high blood pressure.  Dental: Annual dental screenings help detect early signs of oral cancer, gum disease, and other conditions linked to overall health, including heart disease and diabetes.  Please see the attached documents for additional preventive care recommendations.

## 2024-11-02 DIAGNOSIS — Z9181 History of falling: Secondary | ICD-10-CM | POA: Insufficient documentation

## 2024-11-02 NOTE — Assessment & Plan Note (Signed)
 Currently in home health PT.

## 2024-11-02 NOTE — Assessment & Plan Note (Signed)
 Encouraged to get adequate exercise, calcium and vitamin d intake

## 2024-11-02 NOTE — Assessment & Plan Note (Signed)
 Last hgba1c acceptable, minimize simple carbs. Increase exercise as tolerated.

## 2024-11-02 NOTE — Assessment & Plan Note (Signed)
 Encourage heart healthy diet such as MIND or DASH diet, increase exercise, avoid trans fats, simple carbohydrates and processed foods, consider a krill or fish or flaxseed oil cap daily.

## 2024-11-02 NOTE — Assessment & Plan Note (Signed)
 Avoid offending foods, start probiotics. Do not eat large meals in late evening and consider raising head of bed Managing with famotidine  HS and omeprazole  20 mg daily.

## 2024-11-02 NOTE — Progress Notes (Unsigned)
 "  Subjective:     Patient ID: Ronal Prader, female    DOB: 28-Dec-1929, 89 y.o.   MRN: [id number]  No chief complaint on file.   HPI  Discussed the use of AI scribe software for clinical note transcription with the patient, who gave verbal consent to proceed.  History of Present Illness      History of Present Illness Niomi Valent is a 89 year old female who presents for a follow-up visit chronic conditions.  Overall feeling well. She takes thyroid  medication and needs a renewal, with about one week of medication remaining. Her thyroid  levels were stable at the last check. Doing well on current dose.  Patient denies fever, chills, SOB, CP, palpitations, dyspnea, edema, HA, vision changes, N/V/D, abdominal pain, urinary symptoms, rash, weight changes, and recent illness or hospitalizations.   Health Maintenance Due  Topic Date Due   DTaP/Tdap/Td (1 - Tdap) Never done    Past Medical History:  Diagnosis Date   Arthritis    Hyperglycemia 09/04/2020   Hyperlipidemia    Hypothyroid    Pacemaker    Vertigo     Past Surgical History:  Procedure Laterality Date   BACK SURGERY     BIOPSY  04/12/2020   Procedure: BIOPSY;  Surgeon: Wilhelmenia Aloha Raddle., MD;  Location: [facility];  Service: Gastroenterology;;   ROMAYNE Left    CHOLECYSTECTOMY N/A 04/11/2020   Procedure: LAPAROSCOPIC CHOLECYSTECTOMY WITH INTRAOPERATIVE CHOLANGIOGRAM;  Surgeon: Ethyl Lenis, MD;  Location: [facility];  Service: General;  Laterality: N/A;   ERCP N/A 04/12/2020   Procedure: ENDOSCOPIC RETROGRADE CHOLANGIOPANCREATOGRAPHY (ERCP);  Surgeon: Wilhelmenia Aloha Raddle., MD;  Location: [facility];  Service: Gastroenterology;  Laterality: N/A;   FOOT SURGERY Right    bunion   PACEMAKER IMPLANT N/A 05/17/2024   Procedure: PACEMAKER IMPLANT;  Surgeon: Nancey Eulas BRAVO, MD;  Location: [facility];  Service: Cardiovascular;  Laterality: N/A;   REMOVAL OF STONES  04/12/2020   Procedure: REMOVAL OF  STONES;  Surgeon: Wilhelmenia Aloha Raddle., MD;  Location: [facility];  Service: Gastroenterology;;   ANNETT  04/12/2020   Procedure: ANNETT;  Surgeon: Mansouraty, Aloha Raddle., MD;  Location: [facility];  Service: Gastroenterology;;    Family History  Problem Relation Age of Onset   Cancer Mother    Heart disease Father    Arthritis Father        rheumatoid   Cancer Brother        colon   Colon cancer Brother    Esophageal cancer Neg Hx    Inflammatory bowel disease Neg Hx    Liver disease Neg Hx    Pancreatic cancer Neg Hx    Stomach cancer Neg Hx     Social History   Socioeconomic History   Marital status: Widowed    Spouse name: Not on file   Number of children: Not on file   Years of education: Not on file   Highest education level: Not on file  Occupational History   Not on file  Tobacco Use   Smoking status: Never   Smokeless tobacco: Never  Vaping Use   Vaping status: Never Used  Substance and Sexual Activity   Alcohol use: Yes    Comment: socially   Drug use: Never   Sexual activity: Not Currently  Other Topics Concern   Not on file  Social History Narrative   worked in [employer] office, short time. No cigarettes or drug use. Live by self no dietary restictions  and walks daily   Social Drivers of Health   Tobacco Use: Low Risk (11/05/2024)   Patient History    Smoking Tobacco Use: Never    Smokeless Tobacco Use: Never    Passive Exposure: Not on file  Financial Resource Strain: Low Risk (09/23/2023)   Overall Financial Resource Strain (CARDIA)    Difficulty of Paying Living Expenses: Not hard at all  Food Insecurity: No Food Insecurity (10/29/2024)   Epic    Worried About Programme Researcher, Broadcasting/film/video in the Last Year: Never true    Ran Out of Food in the Last Year: Never true  Transportation Needs: No Transportation Needs (10/29/2024)   Epic    Lack of Transportation (Medical): No    Lack of Transportation (Non-Medical): No  Physical  Activity: Inactive (10/29/2024)   Exercise Vital Sign    Days of Exercise per Week: 0 days    Minutes of Exercise per Session: 0 min  Stress: No Stress Concern Present (10/29/2024)   [employer] of Occupational Health - Occupational Stress Questionnaire    Feeling of Stress: Not at all  Social Connections: Moderately Integrated (10/29/2024)   Social Connection and Isolation Panel    Frequency of Communication with Friends and Family: Three times a week    Frequency of Social Gatherings with Friends and Family: Once a week    Attends Religious Services: More than 4 times per year    Active Member of [employer] or Organizations: Yes    Attends Banker Meetings: 1 to 4 times per year    Marital Status: Widowed  Intimate Partner Violence: Not At Risk (10/29/2024)   Epic    Fear of Current or Ex-Partner: No    Emotionally Abused: No    Physically Abused: No    Sexually Abused: No  Depression (PHQ2-9): Low Risk (11/05/2024)   Depression (PHQ2-9)    PHQ-2 Score: 0  Alcohol Screen: Low Risk (09/23/2023)   Alcohol Screen    Last Alcohol Screening Score (AUDIT): 1  Housing: Low Risk (10/29/2024)   Epic    Unable to Pay for Housing in the Last Year: No    Number of Times Moved in the Last Year: 0    Homeless in the Last Year: No  Utilities: Not At Risk (10/29/2024)   Epic    Threatened with loss of utilities: No  Health Literacy: Adequate Health Literacy (09/23/2023)   B1300 Health Literacy    Frequency of need for help with medical instructions: Never    Outpatient Medications Prior to Visit  Medication Sig Dispense Refill   acetaminophen  (TYLENOL ) 500 MG tablet Take 500-1,000 mg by mouth every 6 (six) hours as needed for mild pain (pain score 1-3) or moderate pain (pain score 4-6).     Magnesium  250 MG TABS Take 1 tablet (250 mg total) by mouth 2 (two) times daily.     omeprazole  (PRILOSEC OTC) 20 MG tablet Take 20 mg by mouth daily.     levothyroxine  (SYNTHROID ) 50  MCG tablet Take 1 tablet (50 mcg total) by mouth daily before breakfast. Needs appt 30 tablet 0   No facility-administered medications prior to visit.    Allergies[1]  ROS    See HPI Objective:    Physical Exam Vitals reviewed.  Constitutional:      General: She is not in acute distress.    Appearance: She is not toxic-appearing.  HENT:     Head: Normocephalic and atraumatic.     Mouth/Throat:  Mouth: Mucous membranes are moist.     Pharynx: Oropharynx is clear.  Eyes:     Pupils: Pupils are equal, round, and reactive to light.  Cardiovascular:     Rate and Rhythm: Normal rate and regular rhythm.     Pulses: Normal pulses.     Heart sounds: Murmur heard.  Pulmonary:     Effort: Pulmonary effort is normal. No respiratory distress.     Breath sounds: Normal breath sounds. No wheezing.  Musculoskeletal:        General: No swelling.     Cervical back: Neck supple.  Skin:    General: Skin is warm and dry.  Neurological:     General: No focal deficit present.     Mental Status: She is alert and oriented to person, place, and time.  Psychiatric:        Mood and Affect: Mood normal.        Behavior: Behavior normal.        Thought Content: Thought content normal.        Judgment: Judgment normal.      BP 121/71   Pulse (!) 59   Ht 4' 11 (1.499 m)   Wt 103 lb 6.4 oz (46.9 kg)   SpO2 99%   BMI 20.88 kg/m  Wt Readings from Last 3 Encounters:  11/05/24 103 lb 6.4 oz (46.9 kg)  10/29/24 102 lb (46.3 kg)  08/23/24 105 lb (47.6 kg)       Assessment & Plan:   Problem List Items Addressed This Visit       Cardiovascular and Mediastinum   Mobitz type 2 second degree atrioventricular block - Primary   Stable. Managed by Cardiology, s/p PPM.      Relevant Orders   CBC with Differential/Platelet   Comprehensive metabolic panel with GFR   Lipid panel     Respiratory   Hiatal hernia with GERD   Avoid offending foods, start probiotics. Do not eat large meals  in late evening and consider raising head of bed Managing with famotidine  HS and omeprazole  20 mg daily.        Endocrine   Hyperglycemia   Last hgba1c acceptable, minimize simple carbs. Increase exercise as tolerated.       Hypothyroidism   On Levothyroxine . Monitor TSH.      Relevant Medications   levothyroxine  (SYNTHROID ) 50 MCG tablet   Other Relevant Orders   TSH     Musculoskeletal and Integument   Osteoporosis   Encouraged to get adequate exercise, calcium and vitamin d  intake         Other   Hyperlipidemia   Encourage heart healthy diet such as MIND or DASH diet, increase exercise, avoid trans fats, simple carbohydrates and processed foods, consider a krill or fish or flaxseed oil cap daily.        Relevant Orders   Lipid panel   Mild cognitive impairment (Chronic)   Encourage participation in daily social activities and structured routines to support cognitive function. Pt has family support and doing well. Recommend engaging in mentally stimulating activities such as puzzles, reading, or games. Promote physical activity as tolerated (e.g., walking) and ensure a safe home environment.       Personal history of fall   Currently in home health PT.      Vitamin D  deficiency   Supplement and Monitor.      Relevant Orders   Vitamin D  (25 hydroxy)   Other Visit Diagnoses  Chronic thoracic back pain, unspecified back pain laterality         Need for influenza vaccination       Relevant Orders   Flu vaccine HIGH DOSE PF(Fluzone Trivalent) (Completed)      General Health Maintenance Preventative healthcare measures discussed. Flu shot administered. - Administered flu shot. - Discussed TDAP immunization for future consideration.  FU 6 months CPE    I am having Ronal Krygier maintain her Magnesium , acetaminophen , omeprazole , and levothyroxine .  Meds ordered this encounter  Medications   levothyroxine  (SYNTHROID ) 50 MCG tablet    Sig: Take 1  tablet (50 mcg total) by mouth daily before breakfast. Needs appt    Dispense:  90 tablet    Refill:  1      [1]  Allergies Allergen Reactions   Tylenol  [Acetaminophen ]     Mixed with Advil  - Generalized tremor   "

## 2024-11-02 NOTE — Assessment & Plan Note (Addendum)
 Stable. Managed by Cardiology, s/p PPM.

## 2024-11-02 NOTE — Assessment & Plan Note (Signed)
 Encourage participation in daily social activities and structured routines to support cognitive function. Pt has family support and doing well. Recommend engaging in mentally stimulating activities such as puzzles, reading, or games. Promote physical activity as tolerated (e.g., walking) and ensure a safe home environment.

## 2024-11-02 NOTE — Assessment & Plan Note (Addendum)
On Levothyroxine Monitor TSH

## 2024-11-05 ENCOUNTER — Ambulatory Visit (INDEPENDENT_AMBULATORY_CARE_PROVIDER_SITE_OTHER): Admitting: Student

## 2024-11-05 ENCOUNTER — Encounter: Payer: Self-pay | Admitting: Student

## 2024-11-05 ENCOUNTER — Ambulatory Visit: Payer: Self-pay | Admitting: Student

## 2024-11-05 VITALS — BP 121/71 | HR 59 | Ht 59.0 in | Wt 103.4 lb

## 2024-11-05 DIAGNOSIS — Z23 Encounter for immunization: Secondary | ICD-10-CM | POA: Diagnosis not present

## 2024-11-05 DIAGNOSIS — K219 Gastro-esophageal reflux disease without esophagitis: Secondary | ICD-10-CM | POA: Diagnosis not present

## 2024-11-05 DIAGNOSIS — E785 Hyperlipidemia, unspecified: Secondary | ICD-10-CM

## 2024-11-05 DIAGNOSIS — M546 Pain in thoracic spine: Secondary | ICD-10-CM | POA: Diagnosis not present

## 2024-11-05 DIAGNOSIS — I441 Atrioventricular block, second degree: Secondary | ICD-10-CM | POA: Diagnosis not present

## 2024-11-05 DIAGNOSIS — E875 Hyperkalemia: Secondary | ICD-10-CM

## 2024-11-05 DIAGNOSIS — Z9181 History of falling: Secondary | ICD-10-CM | POA: Diagnosis not present

## 2024-11-05 DIAGNOSIS — E039 Hypothyroidism, unspecified: Secondary | ICD-10-CM | POA: Diagnosis not present

## 2024-11-05 DIAGNOSIS — E559 Vitamin D deficiency, unspecified: Secondary | ICD-10-CM | POA: Diagnosis not present

## 2024-11-05 DIAGNOSIS — R739 Hyperglycemia, unspecified: Secondary | ICD-10-CM

## 2024-11-05 DIAGNOSIS — G3184 Mild cognitive impairment, so stated: Secondary | ICD-10-CM

## 2024-11-05 DIAGNOSIS — K449 Diaphragmatic hernia without obstruction or gangrene: Secondary | ICD-10-CM

## 2024-11-05 DIAGNOSIS — G8929 Other chronic pain: Secondary | ICD-10-CM

## 2024-11-05 LAB — LIPID PANEL
Cholesterol: 186 mg/dL (ref 28–200)
HDL: 67.9 mg/dL
LDL Cholesterol: 102 mg/dL — ABNORMAL HIGH (ref 10–99)
NonHDL: 117.88
Total CHOL/HDL Ratio: 3
Triglycerides: 78 mg/dL (ref 10.0–149.0)
VLDL: 15.6 mg/dL (ref 0.0–40.0)

## 2024-11-05 LAB — COMPREHENSIVE METABOLIC PANEL WITH GFR
ALT: 9 U/L (ref 3–35)
AST: 17 U/L (ref 5–37)
Albumin: 4.2 g/dL (ref 3.5–5.2)
Alkaline Phosphatase: 85 U/L (ref 39–117)
BUN: 17 mg/dL (ref 6–23)
CO2: 30 meq/L (ref 19–32)
Calcium: 9.6 mg/dL (ref 8.4–10.5)
Chloride: 103 meq/L (ref 96–112)
Creatinine, Ser: 0.8 mg/dL (ref 0.40–1.20)
GFR: 63.8 mL/min
Glucose, Bld: 89 mg/dL (ref 70–99)
Potassium: 5.5 meq/L — ABNORMAL HIGH (ref 3.5–5.1)
Sodium: 139 meq/L (ref 135–145)
Total Bilirubin: 0.4 mg/dL (ref 0.2–1.2)
Total Protein: 6.6 g/dL (ref 6.0–8.3)

## 2024-11-05 LAB — CBC WITH DIFFERENTIAL/PLATELET
Basophils Absolute: 0 K/uL (ref 0.0–0.1)
Basophils Relative: 0.6 % (ref 0.0–3.0)
Eosinophils Absolute: 0.1 K/uL (ref 0.0–0.7)
Eosinophils Relative: 1.9 % (ref 0.0–5.0)
HCT: 41.5 % (ref 36.0–46.0)
Hemoglobin: 13.6 g/dL (ref 12.0–15.0)
Lymphocytes Relative: 30.5 % (ref 12.0–46.0)
Lymphs Abs: 1.9 K/uL (ref 0.7–4.0)
MCHC: 32.8 g/dL (ref 30.0–36.0)
MCV: 90.6 fl (ref 78.0–100.0)
Monocytes Absolute: 0.6 K/uL (ref 0.1–1.0)
Monocytes Relative: 8.8 % (ref 3.0–12.0)
Neutro Abs: 3.7 K/uL (ref 1.4–7.7)
Neutrophils Relative %: 58.2 % (ref 43.0–77.0)
Platelets: 291 K/uL (ref 150.0–400.0)
RBC: 4.58 Mil/uL (ref 3.87–5.11)
RDW: 14.8 % (ref 11.5–15.5)
WBC: 6.3 K/uL (ref 4.0–10.5)

## 2024-11-05 LAB — VITAMIN D 25 HYDROXY (VIT D DEFICIENCY, FRACTURES): VITD: 19.97 ng/mL — ABNORMAL LOW (ref 30.00–100.00)

## 2024-11-05 LAB — TSH: TSH: 2.91 u[IU]/mL (ref 0.35–5.50)

## 2024-11-05 MED ORDER — LEVOTHYROXINE SODIUM 50 MCG PO TABS: 50.0000 ug | ORAL_TABLET | Freq: Every day | ORAL | 1 refills | Status: AC

## 2024-11-05 MED ORDER — VITAMIN D (ERGOCALCIFEROL) 1.25 MG (50000 UNIT) PO CAPS: 50000.0000 [IU] | ORAL_CAPSULE | ORAL | 3 refills | Status: AC

## 2024-11-05 NOTE — Assessment & Plan Note (Signed)
 Supplement and Monitor

## 2025-01-01 ENCOUNTER — Encounter

## 2025-04-02 ENCOUNTER — Encounter

## 2025-06-13 ENCOUNTER — Ambulatory Visit: Admitting: Family Medicine

## 2025-06-13 ENCOUNTER — Encounter: Admitting: Family Medicine

## 2025-07-02 ENCOUNTER — Encounter

## 2025-10-01 ENCOUNTER — Encounter

## 2025-10-31 ENCOUNTER — Ambulatory Visit

## 2025-12-31 ENCOUNTER — Encounter

## 2026-04-01 ENCOUNTER — Encounter
